# Patient Record
Sex: Female | Born: 1955 | Race: White | Hispanic: No | Marital: Married | Smoking: Never smoker
Health system: Southern US, Community
[De-identification: ages and names within clinical notes are randomized; demographics above are authoritative.]

## PROBLEM LIST (undated history)

## (undated) ENCOUNTER — Emergency Department (HOSPITAL_COMMUNITY): Admission: EM | Discharge status: Absent without leave | Source: Home / Self Care

## (undated) DIAGNOSIS — K759 Inflammatory liver disease, unspecified: Secondary | ICD-10-CM

## (undated) DIAGNOSIS — R112 Nausea with vomiting, unspecified: Secondary | ICD-10-CM

## (undated) DIAGNOSIS — F329 Major depressive disorder, single episode, unspecified: Secondary | ICD-10-CM

## (undated) DIAGNOSIS — Z9889 Other specified postprocedural states: Secondary | ICD-10-CM

## (undated) DIAGNOSIS — G4730 Sleep apnea, unspecified: Secondary | ICD-10-CM

## (undated) DIAGNOSIS — J45909 Unspecified asthma, uncomplicated: Secondary | ICD-10-CM

## (undated) DIAGNOSIS — F32A Depression, unspecified: Secondary | ICD-10-CM

## (undated) DIAGNOSIS — K219 Gastro-esophageal reflux disease without esophagitis: Secondary | ICD-10-CM

## (undated) DIAGNOSIS — Z8489 Family history of other specified conditions: Secondary | ICD-10-CM

## (undated) DIAGNOSIS — F419 Anxiety disorder, unspecified: Secondary | ICD-10-CM

## (undated) DIAGNOSIS — G4733 Obstructive sleep apnea (adult) (pediatric): Secondary | ICD-10-CM

## (undated) DIAGNOSIS — Z953 Presence of xenogenic heart valve: Secondary | ICD-10-CM

## (undated) DIAGNOSIS — I10 Essential (primary) hypertension: Secondary | ICD-10-CM

## (undated) DIAGNOSIS — D649 Anemia, unspecified: Secondary | ICD-10-CM

## (undated) HISTORY — DX: Anxiety disorder, unspecified: F41.9

## (undated) HISTORY — DX: Major depressive disorder, single episode, unspecified: F32.9

## (undated) HISTORY — PX: TONSILLECTOMY: SUR1361

## (undated) HISTORY — PX: CHOLECYSTECTOMY: SHX55

## (undated) HISTORY — PX: CARDIAC CATHETERIZATION: SHX172

## (undated) HISTORY — DX: Depression, unspecified: F32.A

## (undated) HISTORY — PX: ABDOMINAL HYSTERECTOMY: SHX81

## (undated) HISTORY — DX: Obstructive sleep apnea (adult) (pediatric): G47.33

---

## 1999-03-17 ENCOUNTER — Other Ambulatory Visit: Admission: RE | Admit: 1999-03-17 | Discharge: 1999-03-17 | Payer: Self-pay | Admitting: Gynecology

## 2000-07-04 ENCOUNTER — Other Ambulatory Visit: Admission: RE | Admit: 2000-07-04 | Discharge: 2000-07-04 | Payer: Self-pay | Admitting: Gynecology

## 2001-08-01 ENCOUNTER — Other Ambulatory Visit: Admission: RE | Admit: 2001-08-01 | Discharge: 2001-08-01 | Payer: Self-pay | Admitting: Gynecology

## 2002-09-26 ENCOUNTER — Other Ambulatory Visit: Admission: RE | Admit: 2002-09-26 | Discharge: 2002-09-26 | Payer: Self-pay | Admitting: Gynecology

## 2003-11-12 ENCOUNTER — Other Ambulatory Visit: Admission: RE | Admit: 2003-11-12 | Discharge: 2003-11-12 | Payer: Self-pay | Admitting: Gynecology

## 2004-12-15 ENCOUNTER — Other Ambulatory Visit: Admission: RE | Admit: 2004-12-15 | Discharge: 2004-12-15 | Payer: Self-pay | Admitting: Gynecology

## 2006-02-08 ENCOUNTER — Other Ambulatory Visit: Admission: RE | Admit: 2006-02-08 | Discharge: 2006-02-08 | Payer: Self-pay | Admitting: Gynecology

## 2006-08-22 ENCOUNTER — Encounter: Admission: RE | Admit: 2006-08-22 | Discharge: 2006-08-22 | Payer: Self-pay | Admitting: Allergy and Immunology

## 2007-04-13 ENCOUNTER — Other Ambulatory Visit: Admission: RE | Admit: 2007-04-13 | Discharge: 2007-04-13 | Payer: Self-pay | Admitting: Gynecology

## 2008-09-10 ENCOUNTER — Ambulatory Visit (HOSPITAL_COMMUNITY): Admission: RE | Admit: 2008-09-10 | Discharge: 2008-09-11 | Payer: Self-pay | Admitting: Obstetrics and Gynecology

## 2009-02-22 HISTORY — PX: BLADDER SURGERY: SHX569

## 2009-04-28 ENCOUNTER — Encounter: Admission: RE | Admit: 2009-04-28 | Discharge: 2009-04-28 | Payer: Self-pay | Admitting: Orthopedic Surgery

## 2010-02-22 HISTORY — PX: CARPAL TUNNEL RELEASE: SHX101

## 2010-05-31 LAB — BASIC METABOLIC PANEL
BUN: 5 mg/dL — ABNORMAL LOW (ref 6–23)
CO2: 29 mEq/L (ref 19–32)
Chloride: 104 mEq/L (ref 96–112)
Creatinine, Ser: 0.86 mg/dL (ref 0.4–1.2)
Creatinine, Ser: 0.88 mg/dL (ref 0.4–1.2)
GFR calc Af Amer: >60 mL/min (ref >60)
Glucose, Bld: 111 mg/dL — ABNORMAL HIGH (ref 70–99)
Glucose, Bld: 98 mg/dL (ref 70–99)
Potassium: 3.5 mEq/L (ref 3.5–5.1)

## 2010-05-31 LAB — CBC
HCT: 29.6 % — ABNORMAL LOW (ref 36.0–46.0)
Hemoglobin: 10.2 g/dL — ABNORMAL LOW (ref 12.0–15.0)
MCV: 90.6 fL (ref 78.0–100.0)
RBC: 3.27 MIL/uL — ABNORMAL LOW (ref 3.87–5.11)

## 2010-05-31 LAB — URINALYSIS, ROUTINE W REFLEX MICROSCOPIC
Bilirubin Urine: NEGATIVE
Protein, ur: NEGATIVE mg/dL
Specific Gravity, Urine: <1.005 — ABNORMAL LOW (ref 1.005–1.030)
Urobilinogen, UA: 0.2 mg/dL (ref 0.0–1.0)
pH: 6 (ref 5.0–8.0)

## 2010-06-24 ENCOUNTER — Ambulatory Visit (HOSPITAL_COMMUNITY)
Admission: RE | Admit: 2010-06-24 | Discharge: 2010-06-24 | Disposition: A | Discharge status: Home / Self Care | Payer: BC Managed Care – PPO | Source: Ambulatory Visit | Attending: Family Medicine | Admitting: Family Medicine

## 2010-06-24 ENCOUNTER — Other Ambulatory Visit: Payer: Self-pay | Admitting: Family Medicine

## 2010-06-24 ENCOUNTER — Other Ambulatory Visit (HOSPITAL_COMMUNITY): Payer: Self-pay | Admitting: Family Medicine

## 2010-06-24 ENCOUNTER — Ambulatory Visit
Admission: RE | Admit: 2010-06-24 | Discharge: 2010-06-24 | Disposition: A | Discharge status: Home / Self Care | Payer: BC Managed Care – PPO | Source: Ambulatory Visit | Attending: Family Medicine | Admitting: Family Medicine

## 2010-06-24 DIAGNOSIS — R079 Chest pain, unspecified: Secondary | ICD-10-CM | POA: Insufficient documentation

## 2010-06-24 DIAGNOSIS — R0602 Shortness of breath: Secondary | ICD-10-CM

## 2010-06-24 LAB — CREATININE, SERUM
GFR calc Af Amer: >60 mL/min (ref >60)
GFR calc non Af Amer: >60 mL/min (ref >60)

## 2010-06-24 LAB — BUN: BUN: 13 mg/dL (ref 6–23)

## 2010-06-24 MED ORDER — IOHEXOL 300 MG/ML  SOLN
100.0000 mL | Freq: Once | INTRAMUSCULAR | Status: AC | PRN
  Administered 2010-06-24: 100 mL via INTRAVENOUS

## 2010-07-07 NOTE — Op Note (Signed)
NAME:  Miranda Berger, Miranda Berger                   ACCOUNT NO.:  [id number]   MEDICAL RECORD NO.:  [id number]          PATIENT TYPE:  OIB   LOCATION:  9306                          FACILITY:  [facility]   PHYSICIAN:  Randye Lobo, M.D.   DATE OF BIRTH:  January 30, 1956   DATE OF PROCEDURE:  09/10/2008  DATE OF DISCHARGE:                               OPERATIVE REPORT   PREOPERATIVE DIAGNOSIS:  Genuine stress incontinence.   POSTOPERATIVE DIAGNOSIS:  Genuine stress incontinence.   PROCEDURES:  Monarc transobturator sling and cystoscopy.   SURGEON:  Randye Lobo, MD   ASSISTANT:  Luvenia Redden, MD   ANESTHESIA:  General endotracheal, local with 0.5% lidocaine with  epinephrine 1:200,000.   IV FLUIDS:  1400 mL Ringer lactate.   ESTIMATED BLOOD LOSS:  350 mL.   URINE OUTPUT:  Quantity sufficient.   COMPLICATIONS:  None.   INDICATIONS FOR PROCEDURE:  The patient is a 55 year old para 0  Caucasian female, status post supracervical hysterectomy and bilateral  salpingo-oophorectomy for endometriosis several years prior, who  presents with leakage of urine with coughing, laughing, sneezing and  housework.  The patient experiences frequency and also of leakage of  urine without warning.  She has been treated with anticholinergics in  the past.  She continues to have leakage with stressful maneuvers.  She  did undergo urodynamic testing in May 2008 and this did not document  stress incontinence that the patient was noted to have a low bladder  capacity to 254 mL.  The patient is representing now, reporting  continuation of leakage with stressful maneuvers and that this is  interfering with her lifestyle and she wishes to proceed with surgery.  A plan is now made to proceed with a Monarc transobturator sling and  cystoscopy after risks, benefits, and alternatives are reviewed.   FINDINGS:  Cystoscopy demonstrated a normal bladder throughout 360  degrees including the bladder dome and trigone.  There was  no foreign  body in the bladder or the urethra with placement of the sling.  The  ureters were noted to be patent bilaterally.   SPECIMENS:  None.   PROCEDURE IN DETAIL:  The patient was reidentified in the preoperative  hold area.  She received ciprofloxacin 400 mg IV for antibiotic  prophylaxis.  She received TED hose and PAS stockings for DVT  prophylaxis.   In the operating room, general endotracheal anesthesia was induced and  the patient was then placed in the dorsal lithotomy position.  The lower  abdomen, vagina and perineum were sterilely prepped and draped.  A Foley  catheter was left to gravity drainage throughout the procedure.   A weighted speculum was placed inside the vagina and the patient was  examined.  Her cervix was noted to be present.  She was noted to have a  tight pubic arch.  She had redundant labia.  She also had redundant  mucosal folds underneath the mid urethral area.  There was no evidence  of a cystocele.   Allis clamps were used to mark the midline of the anterior  vaginal wall  over a distance of 3 cm starting 1 cm below the urethral meatus.  The  mucosa was injected locally with 0.5% lidocaine with 1:200,000 of  epinephrine.   At this time, the labia minora were sewn back to the labia majora to  improve visualization.  The mucosa was then incised vertically in the  midline with a scalpel.  The patient was noted to have very atrophic  tissue in this area and the dissection was performed with some  difficulty using a combination of sharp and blunt dissection.  The  dissection was carried back to the pubic rami bilaterally.  Hemostasis  was created with monopolar cautery.   The crural fold incisions were then created below the level of the  adductor longus muscles and at the lateral borders of the pubic rami at  the level of the clitoris.  The Monarc needle passers were then placed.  The needle passer was placed first through the left crural fold   incision, through the obturator membrane and muscle and then out through  the endopelvic fascia at the level of the mid urethral and lateral to  this on the ipsilateral side.  The same procedure was performed on the  right-hand side.  The sling was attached to the needle passers and the  sling was drawn out through the thigh incisions.   The Foley catheter was removed and cystoscopy was performed and the  findings are as noted above.  The bladder was then drained of  cystoscopic fluid and the Foley catheter was replaced.  The plastic  sheaths were removed from the surrounding sling as a Kelly clamp was  placed between the urethra and the sling itself.  Placement was noted to  be excellent.   At this time, there was some bleeding from the patient's right-hand side  just below the exit site of the sling.  Monopolar cautery and then  Gelfoam and pressure were used to create good hemostasis.   Excess vaginal mucosa was trimmed and the anterior vaginal wall was then  closed with a running lock suture of 2-0 Vicryl.   A packing with Estrace cream was placed inside the vagina.   The thigh incisions were closed with Dermabond.   This concluded the patient's procedure.  There were no complications.  All needle, instrument, sponge counts were correct.      Randye Lobo, M.D.  Electronically Signed     BES/MEDQ  D:  09/10/2008  T:  09/11/2008  Job:  [id number]   cc:   Leatha Gilding. Mezer, M.D.  Fax: [phone]

## 2012-01-20 ENCOUNTER — Encounter (HOSPITAL_COMMUNITY): Payer: Self-pay

## 2012-01-20 ENCOUNTER — Emergency Department (HOSPITAL_COMMUNITY)
Admission: EM | Admit: 2012-01-20 | Discharge: 2012-01-20 | Disposition: A | Discharge status: Home / Self Care | Payer: BC Managed Care – PPO | Attending: Emergency Medicine | Admitting: Emergency Medicine

## 2012-01-20 DIAGNOSIS — J45909 Unspecified asthma, uncomplicated: Secondary | ICD-10-CM | POA: Insufficient documentation

## 2012-01-20 DIAGNOSIS — M79609 Pain in unspecified limb: Secondary | ICD-10-CM | POA: Insufficient documentation

## 2012-01-20 DIAGNOSIS — F3289 Other specified depressive episodes: Secondary | ICD-10-CM | POA: Insufficient documentation

## 2012-01-20 DIAGNOSIS — F329 Major depressive disorder, single episode, unspecified: Secondary | ICD-10-CM | POA: Insufficient documentation

## 2012-01-20 DIAGNOSIS — M25559 Pain in unspecified hip: Secondary | ICD-10-CM | POA: Insufficient documentation

## 2012-01-20 DIAGNOSIS — F411 Generalized anxiety disorder: Secondary | ICD-10-CM | POA: Insufficient documentation

## 2012-01-20 DIAGNOSIS — Z79899 Other long term (current) drug therapy: Secondary | ICD-10-CM | POA: Insufficient documentation

## 2012-01-20 DIAGNOSIS — B029 Zoster without complications: Secondary | ICD-10-CM

## 2012-01-20 DIAGNOSIS — I10 Essential (primary) hypertension: Secondary | ICD-10-CM | POA: Insufficient documentation

## 2012-01-20 HISTORY — DX: Unspecified asthma, uncomplicated: J45.909

## 2012-01-20 HISTORY — DX: Essential (primary) hypertension: I10

## 2012-01-20 MED ORDER — VALACYCLOVIR HCL 500 MG PO TABS
1000.0000 mg | ORAL_TABLET | Freq: Once | ORAL | Status: AC
  Administered 2012-01-20: 1000 mg via ORAL
  Filled 2012-01-20: qty 2

## 2012-01-20 MED ORDER — METHYLPREDNISOLONE SODIUM SUCC 125 MG IJ SOLR: 125.0000 mg | Freq: Once | INTRAMUSCULAR | Status: DC

## 2012-01-20 MED ORDER — HYDROMORPHONE HCL PF 1 MG/ML IJ SOLN
1.0000 mg | Freq: Once | INTRAMUSCULAR | Status: AC
  Administered 2012-01-20: 1 mg via INTRAVENOUS
  Filled 2012-01-20: qty 1

## 2012-01-20 MED ORDER — OXYCODONE-ACETAMINOPHEN 5-325 MG PO TABS: ORAL_TABLET | ORAL | Status: DC

## 2012-01-20 MED ORDER — VALACYCLOVIR HCL 1 G PO TABS: 1000.0000 mg | ORAL_TABLET | Freq: Three times a day (TID) | ORAL | Status: AC

## 2012-01-20 NOTE — ED Notes (Signed)
Pt reports that Saturday night pain started in hip, back, and right leg.  Pt states that pain has gotten worse.

## 2012-01-20 NOTE — Discharge Instructions (Signed)
 Shingles Shingles is caused by the same virus that causes chickenpox (varicella zoster virus or VZV). Shingles often occurs many years or decades after having chickenpox. That is why it is more common in adults older than 50 years. The virus reactivates and breaks out as an infection in a nerve root. SYMPTOMS   The initial feeling (sensations) may be pain. This pain is usually described as:  Burning.  Stabbing.  Throbbing.  Tingling in the nerve root.  A red rash will follow in a couple days. The rash may occur in any area of the body and is usually on one side (unilateral) of the body in a band or belt-like pattern. The rash usually starts out as very small blisters (vesicles). They will dry up after 7 to 10 days. This is not usually a significant problem except for the pain it causes.  Long-lasting (chronic) pain is more likely in an elderly person. It can last months to years. This condition is called postherpetic neuralgia. Shingles can be an extremely severe infection in someone with AIDS, a weakened immune system, or with forms of leukemia. It can also be severe if you are taking transplant medicines or other medicines that weaken the immune system. TREATMENT  Your caregiver will often treat you with:  Antiviral drugs.  Anti-inflammatory drugs.  Pain medicines. Bed rest is very important in preventing the pain associated with herpes zoster (postherpetic neuralgia). Application of heat in the form of a hot water bottle or electric heating pad or gentle pressure with the hand is recommended to help with the pain or discomfort. PREVENTION  A varicella zoster vaccine is available to help protect against the virus. The Food and Drug Administration approved the varicella zoster vaccine for individuals 55 years of age and older. HOME CARE INSTRUCTIONS   Cool compresses to the area of rash may be helpful.  Only take over-the-counter or prescription medicines for pain, discomfort, or  fever as directed by your caregiver.  Avoid contact with:  Babies.  Pregnant women.  Children with eczema.  Elderly people with transplants.  People with chronic illnesses, such as leukemia and AIDS.  If the area involved is on your face, you may receive a referral for follow-up to a specialist. It is very important to keep all follow-up appointments. This will help avoid eye complications, chronic pain, or disability. SEEK IMMEDIATE MEDICAL CARE IF:   You develop any pain (headache) in the area of the face or eye. This must be followed carefully by your caregiver or ophthalmologist. An infection in part of your eye (cornea) can be very serious. It could lead to blindness.  You do not have pain relief from prescribed medicines.  Your redness or swelling spreads.  The area involved becomes very swollen and painful.  You have a fever.  You notice any red or painful lines extending away from the affected area toward your heart (lymphangitis).  Your condition is worsening or has changed. Document Released: 02/08/2005 Document Revised: 05/03/2011 Document Reviewed: 01/13/2009 [facility] [place] Patient Information 2013 [place], [place].    Narcotic and benzodiazepine use may cause drowsiness, slowed breathing or dependence.  Please use with caution and do not drive, operate machinery or watch young children alone while taking them.  Taking combinations of these medications or drinking alcohol will potentiate these effects.

## 2012-01-20 NOTE — ED Provider Notes (Signed)
History     CSN: [id number]  Arrival date & time 01/20/12  2841   First MD Initiated Contact with Patient 01/20/12 [phone]      Chief Complaint  Patient presents with  . Back Pain  . Hip Pain  . Leg Pain    (Consider location/radiation/quality/duration/timing/severity/associated sxs/prior treatment) HPI Comments: The patient reports that approximately one week ago, and she developed pain in her right lower back without any significant injury. She denies fever or chills, numbness or weakness, urinary symptoms. She reports the pain gradually has gotten worse down her right buttock, right hip and toward her right anterior knee. She did speak to her primary care physician who saw her on Tuesday which was 2 days ago and noticed to "spots on the skin of her low back. She was put on Vicodin as well as high dose ibuprofen for her symptoms. The patient has been taking her pain medications without any significant relief. Pain has continued to get worse and is now constant. That she did have chickenpox as a child. She has never had shingles in the past and has not had her shingles vaccination. Medical history is significant for depression, anxiety and hypertension.  Patient is a 56 y.o. female presenting with back pain, hip pain, and leg pain. The history is provided by the patient and the spouse.  Back Pain  Associated symptoms include leg pain. Pertinent negatives include no fever, no numbness and no weakness.  Hip Pain  Leg Pain  Pertinent negatives include no numbness.    Past Medical History  Diagnosis Date  . Asthma   . Hypertension     Past Surgical History  Procedure Date  . Abdominal hysterectomy   . Cholecystectomy   . Tonsillectomy     History reviewed. No pertinent family history.  History  Substance Use Topics  . Smoking status: Never Smoker   . Smokeless tobacco: Not on file  . Alcohol Use: No    OB History    Grav Para Term Preterm Abortions TAB SAB Ect Mult  Living                  Review of Systems  Constitutional: Negative for fever and chills.  Genitourinary: Negative for difficulty urinating.  Musculoskeletal: Positive for back pain.  Skin: Positive for color change and rash. Negative for wound.  Neurological: Negative for weakness and numbness.  All other systems reviewed and are negative.    Allergies  Prednisone  Home Medications   Current Outpatient Rx  Name  Route  Sig  Dispense  Refill  . ALPRAZOLAM 0.25 MG PO TABS   Oral   Take 0.25 mg by mouth at bedtime as needed.         . ATENOLOL 50 MG PO TABS   Oral   Take 50 mg by mouth daily.         [name] FLUTICASONE PROPIONATE 50 MCG/ACT NA SUSP   Nasal   Place 2 sprays into the nose daily.         . IBUPROFEN 200 MG PO TABS   Oral   Take 600 mg by mouth 3 (three) times daily.         . SERTRALINE HCL 100 MG PO TABS   Oral   Take 200 mg by mouth daily.         . OXYCODONE-ACETAMINOPHEN 5-325 MG PO TABS      1-2 tablets po q 6 hours prn moderate to severe pain  20 tablet   0   . VALACYCLOVIR HCL 1 G PO TABS   Oral   Take 1 tablet (1,000 mg total) by mouth 3 (three) times daily.   21 tablet   0     BP 136/98  Pulse 80  Temp 98.1 F (36.7 C) (Oral)  Resp 20  SpO2 97%  Physical Exam  Nursing note and vitals reviewed. Constitutional: She is oriented to person, place, and time. She appears well-developed and well-nourished.  HENT:  Head: Normocephalic and atraumatic.  Pulmonary/Chest: Effort normal. No respiratory distress. She has no wheezes.  Abdominal: Soft.  Musculoskeletal:       Lumbar back: She exhibits no deformity.       Back:  Neurological: She is alert and oriented to person, place, and time. She has normal strength and normal reflexes. GCS eye subscore is 4. GCS verbal subscore is 5. GCS motor subscore is 6.  Skin: Skin is warm. Rash noted. Rash is vesicular. She is not diaphoretic. No pallor.  Psychiatric: Her speech is normal  and behavior is normal. Judgment normal. Her mood appears anxious. Cognition and memory are normal.    ED Course  Procedures (including critical care time)  Labs Reviewed - No data to display No results found.   1. Shingles     Room air saturation is 97% and I interpret this to be norma   MDM  History and examination is consistent with shingles. Patient's pain radiates right along her L5 dermatome. My suspicions are relayed to the patient and family. My plan is to give IV analgesics for more severe pain and increase her prescription for Percocet. I will add Valtrex to her medications although her symptoms have been present for nearly a week already, I am unsure if this will improve her symptoms. She does have adverse reactions to prednisone therefore we'll not give her any Solu-Medrol as originally planned.        Gavin Pound. Oletta Lamas, MD 01/20/12 [phone]

## 2012-06-22 DIAGNOSIS — R300 Dysuria: Secondary | ICD-10-CM | POA: Insufficient documentation

## 2012-06-22 DIAGNOSIS — R102 Pelvic and perineal pain unspecified side: Secondary | ICD-10-CM | POA: Insufficient documentation

## 2012-06-22 DIAGNOSIS — R3915 Urgency of urination: Secondary | ICD-10-CM | POA: Insufficient documentation

## 2012-06-22 DIAGNOSIS — R109 Unspecified abdominal pain: Secondary | ICD-10-CM | POA: Insufficient documentation

## 2012-06-22 DIAGNOSIS — N3010 Interstitial cystitis (chronic) without hematuria: Secondary | ICD-10-CM | POA: Insufficient documentation

## 2012-06-22 DIAGNOSIS — IMO0002 Reserved for concepts with insufficient information to code with codable children: Secondary | ICD-10-CM

## 2012-06-22 HISTORY — DX: Pelvic and perineal pain: R10.2

## 2012-06-22 HISTORY — DX: Unspecified abdominal pain: R10.9

## 2012-06-22 HISTORY — DX: Reserved for concepts with insufficient information to code with codable children: IMO0002

## 2012-06-22 HISTORY — DX: Urgency of urination: R39.15

## 2012-06-22 HISTORY — DX: Dysuria: R30.0

## 2012-06-22 HISTORY — DX: Interstitial cystitis (chronic) without hematuria: N30.10

## 2013-01-03 ENCOUNTER — Other Ambulatory Visit: Payer: Self-pay | Admitting: Family Medicine

## 2013-01-03 DIAGNOSIS — R109 Unspecified abdominal pain: Secondary | ICD-10-CM

## 2013-01-08 ENCOUNTER — Other Ambulatory Visit: Payer: Self-pay | Admitting: Family Medicine

## 2013-01-08 ENCOUNTER — Ambulatory Visit
Admission: RE | Admit: 2013-01-08 | Discharge: 2013-01-08 | Disposition: A | Discharge status: Home / Self Care | Payer: BC Managed Care – PPO | Source: Ambulatory Visit | Attending: Family Medicine | Admitting: Family Medicine

## 2013-01-08 DIAGNOSIS — R110 Nausea: Secondary | ICD-10-CM

## 2013-01-08 DIAGNOSIS — R1013 Epigastric pain: Secondary | ICD-10-CM

## 2013-01-08 DIAGNOSIS — R109 Unspecified abdominal pain: Secondary | ICD-10-CM

## 2013-04-24 ENCOUNTER — Ambulatory Visit
Admission: RE | Admit: 2013-04-24 | Discharge: 2013-04-24 | Disposition: A | Discharge status: Home / Self Care | Payer: BC Managed Care – PPO | Source: Ambulatory Visit | Attending: Family Medicine | Admitting: Family Medicine

## 2013-04-24 ENCOUNTER — Other Ambulatory Visit: Payer: Self-pay | Admitting: Family Medicine

## 2013-04-24 DIAGNOSIS — M545 Low back pain, unspecified: Secondary | ICD-10-CM

## 2013-04-25 ENCOUNTER — Other Ambulatory Visit: Payer: Self-pay | Admitting: Family Medicine

## 2013-04-25 DIAGNOSIS — M5410 Radiculopathy, site unspecified: Secondary | ICD-10-CM

## 2013-04-25 DIAGNOSIS — M549 Dorsalgia, unspecified: Secondary | ICD-10-CM

## 2013-04-27 ENCOUNTER — Ambulatory Visit
Admission: RE | Admit: 2013-04-27 | Discharge: 2013-04-27 | Disposition: A | Discharge status: Home / Self Care | Payer: BC Managed Care – PPO | Source: Ambulatory Visit | Attending: Family Medicine | Admitting: Family Medicine

## 2013-04-27 DIAGNOSIS — M5410 Radiculopathy, site unspecified: Secondary | ICD-10-CM

## 2013-04-27 DIAGNOSIS — M549 Dorsalgia, unspecified: Secondary | ICD-10-CM

## 2013-12-26 DIAGNOSIS — N9489 Other specified conditions associated with female genital organs and menstrual cycle: Secondary | ICD-10-CM | POA: Insufficient documentation

## 2013-12-26 DIAGNOSIS — M6289 Other specified disorders of muscle: Secondary | ICD-10-CM | POA: Insufficient documentation

## 2013-12-26 HISTORY — DX: Other specified conditions associated with female genital organs and menstrual cycle: N94.89

## 2013-12-26 HISTORY — DX: Other specified disorders of muscle: M62.89

## 2014-07-31 ENCOUNTER — Ambulatory Visit (INDEPENDENT_AMBULATORY_CARE_PROVIDER_SITE_OTHER): Payer: BC Managed Care – PPO | Admitting: Neurology

## 2014-07-31 ENCOUNTER — Encounter: Payer: Self-pay | Admitting: Neurology

## 2014-07-31 VITALS — BP 102/64 | HR 68 | Resp 16 | Ht 63.0 in | Wt 194.0 lb

## 2014-07-31 DIAGNOSIS — R519 Headache, unspecified: Secondary | ICD-10-CM

## 2014-07-31 DIAGNOSIS — R351 Nocturia: Secondary | ICD-10-CM

## 2014-07-31 DIAGNOSIS — R51 Headache: Secondary | ICD-10-CM

## 2014-07-31 DIAGNOSIS — G2581 Restless legs syndrome: Secondary | ICD-10-CM | POA: Diagnosis not present

## 2014-07-31 DIAGNOSIS — G4719 Other hypersomnia: Secondary | ICD-10-CM | POA: Diagnosis not present

## 2014-07-31 DIAGNOSIS — G4761 Periodic limb movement disorder: Secondary | ICD-10-CM

## 2014-07-31 DIAGNOSIS — R0683 Snoring: Secondary | ICD-10-CM | POA: Diagnosis not present

## 2014-07-31 NOTE — Progress Notes (Signed)
Subjective:    Patient ID: Miranda GamblesRita S Schlagel is a 59 y.o. female.  HPI     Huston FoleySaima Laurann Mcmorris, MD, PhD [facility] [address], [address] [address] [place], [place] [address]  Dear [name],   I saw your patient, Miranda MonsRita Berger, upon your kind request in my neurologic clinic today for initial consultation of her sleep disorder, in particular, concern for underlying obstructive sleep apnea. The patient is unaccompanied today. As you know, Ms. Miranda KentHood is a 59 year old right-handed woman with an underlying medical history of major depression, panic disorder, hypertension, carpal tunnel syndrome, status post surgery on the right wrist, left sided plantar fasciitis, and obesity, who reports snoring, witnessed apneas, poor sleep consolidation and excessive daytime somnolence. I reviewed your office note from 06/25/2014 which you kindly included. She had a sleep study about a year ago but since then her sleep issues have become worse. She can take prolonged during the day. She is currently on Xanax 0.25 mg up to 4 times a day as needed for anxiety and Remeron 45 mg each night. She has been off of Zoloft and on Remeron for the past 1+ year. She had a sleep study over a year ago which per her report showed leg twitching at night. She does have some restless leg symptoms. She wakes up to use the bathroom perhaps once per night. She has occasional morning headaches. She does not wake up rested despite getting enough sleep. Bedtime is around 10:30 to 11 and usual price time is around 8:30. Epworth sleepiness score is 17 out of 24 today and fatigue score is 15 out of 63 today. Her father has obstructive sleep apnea and uses a CPAP machine. She had a tonsillectomy in the past. She would be willing to use a CPAP machine if the need arises. She drinks usually coffee a few times a week but does not have to have caffeine every day. She is a nonsmoker and drinks alcohol very rarely. 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 Snoring can be quite loud. She makes abnormal noises in her sleep. She has some leg twitching at night  Her Past Medical History Is Significant For: Past Medical History  Diagnosis Date  . Asthma   . Hypertension   . Depression   . Anxiety     Her Past Surgical History Is Significant For: Past Surgical History  Procedure Laterality Date  . Abdominal hysterectomy    . Cholecystectomy    . Tonsillectomy    . Carpal tunnel release  2012  . Bladder surgery  2011    Her Family History Is Significant For: Family History  Problem Relation Age of Onset  . Stroke Mother   . Leukemia Mother   . Stroke Father   . Sleep apnea Father     Her Social History Is Significant For: History   Social History  . Marital Status: Married    Spouse Name: N/A  . Number of Children: 1  . Years of Education: BS   Social History Main Topics  . Smoking status: Never Smoker   . Smokeless tobacco: Not on file  . Alcohol Use: No  . Drug Use: No  . Sexual Activity: Not on file   Other Topics Concern  . None   Social History Narrative   Occasionally consumes tea or coffee    Her Allergies Are:  Allergies  Allergen Reactions  . Prednisone Itching  :   Her  Current Medications Are:  Outpatient Encounter Prescriptions as of 07/31/2014  Medication Sig  . ALPRAZolam (XANAX) 0.25 MG tablet Take 0.25 mg by mouth at bedtime as needed.  [name] atenolol (TENORMIN) 50 MG tablet Take 50 mg by mouth daily.  . calcium carbonate (OS-CAL) 600 MG TABS tablet Take 600 mg by mouth 2 (two) times daily with a meal.  . fluticasone (FLONASE) 50 MCG/ACT nasal spray Place 2 sprays into the nose daily.  [name] HYDROcodone-acetaminophen (NORCO/VICODIN) 5-325 MG per tablet TK 1 T PO  Q 6 TO 8 H PRN P  . mirtazapine (REMERON) 30 MG tablet TK 1 T PO  QHS.  . Multiple Vitamin (MULTIVITAMIN) capsule Take 1 capsule by mouth daily.  . [DISCONTINUED] ibuprofen (ADVIL,MOTRIN) 200 MG  tablet Take 600 mg by mouth 3 (three) times daily.  . [DISCONTINUED] oxyCODONE-acetaminophen (PERCOCET/ROXICET) 5-325 MG per tablet 1-2 tablets po q 6 hours prn moderate to severe pain  . [DISCONTINUED] sertraline (ZOLOFT) 100 MG tablet Take 200 mg by mouth daily.   No facility-administered encounter medications on file as of 07/31/2014.  :  Review of Systems:  Out of a complete 14 point review of systems, all are reviewed and negative with the exception of these symptoms as listed below:   Review of Systems  Constitutional: Positive for fatigue.       Weight gain   Genitourinary:       Incontinence   Allergic/Immunologic: Positive for environmental allergies.  Neurological:       Snoring, no reported trouble falling or staying asleep, husband reports patient has "labored breathing" at night, wakes up in the morning feeling tired, daytime tiredness, patient reports she will fall asleep if she sits down, takes daily naps.   Psychiatric/Behavioral:       Depression, anxiety, racing thoughts     Objective:  Neurologic Exam  Physical Exam Physical Examination:   Filed Vitals:   07/31/14 1408  BP: 102/64  Pulse: 68  Resp: 16    General Examination: The patient is a very pleasant 59 y.o. female in no acute distress. She appears well-developed and well-nourished and well groomed. She is mildly anxious appearing.   HEENT: Normocephalic, atraumatic, pupils are equal, round and reactive to light and accommodation. Funduscopic exam is normal with sharp disc margins noted. Extraocular tracking is good without limitation to gaze excursion or nystagmus noted. Normal smooth pursuit is noted. Hearing is grossly intact. Tympanic membranes are clear bilaterally. Face is symmetric with normal facial animation and normal facial sensation. Speech is clear with no dysarthria noted. There is no hypophonia. There is no lip, neck/head, jaw or voice tremor. Neck is supple with full range of passive and  active motion. There are no carotid bruits on auscultation. Oropharynx exam reveals: mild mouth dryness, good dental hygiene and mild airway crowding, due to narrow airway entry and redundant soft palate. Mallampati is class II. Tongue protrudes centrally and palate elevates symmetrically. Tonsils are absent. Neck size is 15.5 inches. She has a Absent overbite. Nasal inspection reveals no significant nasal mucosal bogginess or redness and no septal deviation.   Chest: Clear to auscultation without wheezing, rhonchi or crackles noted.  Heart: S1+S2+0, regular and normal without murmurs, rubs or gallops noted.   Abdomen: Soft, non-tender and non-distended with normal bowel sounds appreciated on auscultation.  Extremities: There is no pitting edema in the distal lower extremities bilaterally. Pedal pulses are intact. She has mild nonpitting puffiness around the lateral aspect of the left ankle. She  has left heel pain.  Skin: Warm and dry without trophic changes noted. There are no varicose veins.  Musculoskeletal: exam reveals no obvious joint deformities, tenderness or joint swelling or erythema.   Neurologically:  Mental status: The patient is awake, alert and oriented in all 4 spheres. Her immediate and remote memory, attention, language skills and fund of knowledge are appropriate. There is no evidence of aphasia, agnosia, apraxia or anomia. Speech is clear with normal prosody and enunciation. Thought process is linear. Mood is normal and affect is normal.  Cranial nerves II - XII are as described above under HEENT exam. In addition: shoulder shrug is normal with equal shoulder height noted. Motor exam: Normal bulk, strength and tone is noted. There is no drift, tremor or rebound. Romberg is negative. Reflexes are 2+ throughout. Babinski: Toes are flexor bilaterally. Fine motor skills and coordination: intact with normal finger taps, normal hand movements, normal rapid alternating patting, normal  foot taps and normal foot agility.  Cerebellar testing: No dysmetria or intention tremor on finger to nose testing. Heel to shin is unremarkable bilaterally. There is no truncal or gait ataxia.  Sensory exam: intact to light touch, pinprick, vibration, temperature sense in the upper and lower extremities.  Gait, station and balance: She stands easily. No veering to one side is noted. No leaning to one side is noted. Posture is age-appropriate and stance is narrow based. Gait shows normal stride length and normal pace. No problems turning are noted. She turns en bloc. Tandem walk is slightly difficult for her, due to left heel pain.    Assessment and plan:   In summary, FRITZIE PRIOLEAU is a very pleasant 59 y.o.-year old female with an underlying medical history of major depression, panic disorder, hypertension, carpal tunnel syndrome, status post surgery on the right wrist, left sided plantar fasciitis, and obesity, who reports snoring, witnessed apneas, poor sleep consolidation and excessive daytime somnolence. Her history and physical exam are indeed concerning for underlying obstructive sleep apnea. In addition, she endorses restless leg syndrome and leg twitching at night. I had a long chat with the patient about my findings and the diagnosis of OSA, its prognosis and treatment options. We talked about medical treatments, surgical interventions and non-pharmacological approaches. I explained in particular the risks and ramifications of untreated moderate to severe OSA, especially with respect to developing cardiovascular disease down the Road, including congestive heart failure, difficult to treat hypertension, cardiac arrhythmias, or stroke. Even type 2 diabetes has, in part, been linked to untreated OSA. Symptoms of untreated OSA include daytime sleepiness, memory problems, mood irritability and mood disorder such as depression and anxiety, lack of energy, as well as recurrent headaches, especially morning  headaches. We talked about trying to maintain a healthy lifestyle in general, as well as the importance of weight control. I encouraged the patient to eat healthy, exercise daily and keep well hydrated, to keep a scheduled bedtime and wake time routine, to not skip any meals and eat healthy snacks in between meals. I advised the patient not to drive when feeling sleepy. I recommended the following at this time: sleep study with potential positive airway pressure titration. (We will score hypopneas at 3% and split the sleep study into diagnostic and treatment portion, if the estimated. 2 hour AHI is >15/h).   I explained the sleep test procedure to the patient and also outlined possible surgical and non-surgical treatment options of OSA, including the use of a custom-made dental device (which would  require a referral to a specialist dentist or oral surgeon), upper airway surgical options, such as pillar implants, radiofrequency surgery, tongue base surgery, and UPPP (which would involve a referral to an ENT surgeon). Rarely, jaw surgery such as mandibular advancement may be considered.  I also explained the CPAP treatment option to the patient, who indicated that she would be willing to try CPAP if the need arises. I explained the importance of being compliant with PAP treatment, not only for insurance purposes but primarily to improve Her symptoms, and for the patient's long term health benefit, including to reduce Her cardiovascular risks. I answered all her questions today and the patient was in agreement. I would like to see her back after the sleep study is completed and encouraged her to call with any interim questions, concerns, problems or updates.   Thank you very much for allowing me to participate in the care of this nice patient. If I can be of any further assistance to you please do not hesitate to call me at [phone].  Sincerely,   Star Age, MD, PhD

## 2014-07-31 NOTE — Patient Instructions (Addendum)

## 2014-08-20 ENCOUNTER — Ambulatory Visit (INDEPENDENT_AMBULATORY_CARE_PROVIDER_SITE_OTHER): Payer: BC Managed Care – PPO | Admitting: Neurology

## 2014-08-20 DIAGNOSIS — G4761 Periodic limb movement disorder: Secondary | ICD-10-CM

## 2014-08-20 DIAGNOSIS — G4730 Sleep apnea, unspecified: Secondary | ICD-10-CM

## 2014-08-20 DIAGNOSIS — G4710 Hypersomnia, unspecified: Secondary | ICD-10-CM

## 2014-08-20 DIAGNOSIS — G4733 Obstructive sleep apnea (adult) (pediatric): Secondary | ICD-10-CM | POA: Diagnosis not present

## 2014-08-20 DIAGNOSIS — G479 Sleep disorder, unspecified: Secondary | ICD-10-CM

## 2014-08-21 NOTE — Sleep Study (Signed)
Please see the scanned sleep study interpretation located in the Procedure tab within the Chart Review section. 

## 2014-08-23 ENCOUNTER — Telehealth: Payer: Self-pay | Admitting: Neurology

## 2014-08-23 DIAGNOSIS — G4733 Obstructive sleep apnea (adult) (pediatric): Secondary | ICD-10-CM

## 2014-08-23 NOTE — Telephone Encounter (Signed)
Please call and notify the patient that the recent sleep study did confirm the diagnosis of obstructive sleep apnea and that I recommend treatment for this in the form of CPAP. This will require a repeat sleep study for proper titration and mask fitting. Please explain to patient and arrange for a CPAP titration study. I have placed an order in the chart. Thanks, Terasa Orsini, MD, PhD [facility] ([facility])  

## 2014-08-27 NOTE — Telephone Encounter (Signed)
I spoke with Nicolaus [name], she is aware of results. She is willing to proceed and is aware that Alvis LemmingsDawn will call back to schedule second study. I will fax report to referring doctor and PCP.

## 2014-09-17 ENCOUNTER — Ambulatory Visit (INDEPENDENT_AMBULATORY_CARE_PROVIDER_SITE_OTHER): Payer: BC Managed Care – PPO | Admitting: Neurology

## 2014-09-17 DIAGNOSIS — G479 Sleep disorder, unspecified: Secondary | ICD-10-CM

## 2014-09-17 DIAGNOSIS — G4733 Obstructive sleep apnea (adult) (pediatric): Secondary | ICD-10-CM

## 2014-09-17 DIAGNOSIS — G4761 Periodic limb movement disorder: Secondary | ICD-10-CM

## 2014-09-17 NOTE — Sleep Study (Signed)
Please see the scanned sleep study interpretation located in the Procedure tab within the Chart Review section. 

## 2014-09-26 ENCOUNTER — Telehealth: Payer: Self-pay | Admitting: Neurology

## 2014-09-26 DIAGNOSIS — G4733 Obstructive sleep apnea (adult) (pediatric): Secondary | ICD-10-CM

## 2014-09-26 NOTE — Telephone Encounter (Signed)
Patient seen on 07/31/14, PSG on 08/20/14, CPAP titration study on 09/17/14, ins: [employer] Please call and inform patient that I have entered an order for treatment with PAP. She did well during the latest sleep study with CPAP. We will, therefore, arrange for a machine for home use through a DME (durable medical equipment) company of Her choice; and I will see the patient back in follow-up in about 8-10 weeks. Please also explain to the patient that I will be looking out for compliance data downloaded from the machine, which can be done remotely through a modem at times or stored on an SD card in the back of the machine. At the time of the followup appointment we will discuss sleep study results and how it is going with PAP treatment at home. Please advise patient to bring Her machine at the time of the visit; at least for the first visit, even though this is cumbersome. Bringing the machine for every visit after that may not be needed, but often helps for the first visit. Please also make sure, the patient has a follow-up appointment with me in about 8-10 weeks from the setup date, thanks.   Huston Foley, MD, PhD [facility])

## 2014-09-27 ENCOUNTER — Telehealth: Payer: Self-pay

## 2014-09-27 NOTE — Telephone Encounter (Signed)
I spoke to patient and she is aware of results and recommendation. She states that she would like to get started on CPAP at home. I will send referral to [facility] and send the patient letting her know who we referred her too and the importance of compliance. I will fax report to PCP.

## 2014-09-27 NOTE — Telephone Encounter (Signed)
Huston Foley, MD at 09/26/2014 12:27 PM     Status: Signed       Expand All Collapse All   Patient seen on 07/31/14, PSG on 08/20/14, CPAP titration study on 09/17/14, ins: [employer] Please call and inform patient that I have entered an order for treatment with PAP. She did well during the latest sleep study with CPAP. We will, therefore, arrange for a machine for home use through a DME (durable medical equipment) company of Her choice; and I will see the patient back in follow-up in about 8-10 weeks. Please also explain to the patient that I will be looking out for compliance data downloaded from the machine, which can be done remotely through a modem at times or stored on an SD card in the back of the machine. At the time of the followup appointment we will discuss sleep study results and how it is going with PAP treatment at home. Please advise patient to bring Her machine at the time of the visit; at least for the first visit, even though this is cumbersome. Bringing the machine for every visit after that may not be needed, but often helps for the first visit. Please also make sure, the patient has a follow-up appointment with me in about 8-10 weeks from the setup date, thanks.   Huston Foley, MD, PhD [facility])

## 2014-11-18 ENCOUNTER — Telehealth: Payer: Self-pay | Admitting: Neurology

## 2014-11-18 NOTE — Telephone Encounter (Signed)
Pt is scheduled for her cpap f/u on 11/21/14 and needs to reschedule this but know this has to be ina certain time frame and needs to speak with the nurse. Please call pt @ [phone].

## 2014-11-18 NOTE — Telephone Encounter (Signed)
I spoke to patient and was able to move appt to 10/12.

## 2014-11-21 ENCOUNTER — Ambulatory Visit: Payer: Self-pay | Admitting: Neurology

## 2014-12-04 ENCOUNTER — Encounter: Payer: Self-pay | Admitting: Neurology

## 2014-12-04 ENCOUNTER — Ambulatory Visit (INDEPENDENT_AMBULATORY_CARE_PROVIDER_SITE_OTHER): Payer: BC Managed Care – PPO | Admitting: Neurology

## 2014-12-04 VITALS — BP 128/76 | HR 72 | Resp 16 | Ht 63.0 in | Wt 194.0 lb

## 2014-12-04 DIAGNOSIS — G4761 Periodic limb movement disorder: Secondary | ICD-10-CM | POA: Diagnosis not present

## 2014-12-04 DIAGNOSIS — G4733 Obstructive sleep apnea (adult) (pediatric): Secondary | ICD-10-CM

## 2014-12-04 DIAGNOSIS — Z9989 Dependence on other enabling machines and devices: Principal | ICD-10-CM

## 2014-12-04 DIAGNOSIS — G2581 Restless legs syndrome: Secondary | ICD-10-CM

## 2014-12-04 NOTE — Patient Instructions (Addendum)

## 2014-12-04 NOTE — Progress Notes (Signed)
Subjective:    Patient ID: DEMESHIA Berger is a 59 y.o. female.  HPI     Interim history:   Ms. Miranda Berger is a 59 year old right-handed woman with an underlying medical history of major depression, panic disorder, hypertension, carpal tunnel syndrome, status post surgery on the right wrist, left sided plantar fasciitis, and obesity, who presents for follow-up consultation of her obstructive sleep apnea, after her recent sleep studies. The patient is unaccompanied today. I first met her on 07/31/2014 at the request of her psychiatrist, at which time the patient reported snoring, poor sleep consolidation, excessive daytime somnolence and witnessed apneic pauses while asleep. I invited her back for sleep study. She had a baseline sleep study, followed by a CPAP titration study and I went over her test results with her in detail today. Her baseline sleep study from 08/20/2014 showed a sleep efficiency of 89.6% with a latency to sleep of 34 minutes and wake after sleep onset of 10.5 minutes with mild sleep fragmentation noted. She had an increased percentage of slow-wave sleep and a normal percentage of REM sleep with a normal REM latency. She had moderate PLMS with mild arousals with a PLM index of 35.2 per hour and a PLM associated arousal index of 4.1 per hour. She had no significant EKG or EEG changes. She had mild to moderate and at times loud snoring. Total AHI was 10.7 per hour, rising to 26.7 per hour during REM sleep and 32.2 per hour in the supine position. Baseline oxygen saturation was 92%, nadir was 71% during REM sleep. Based on her test results I invited her back for a full night CPAP titration study. She had this on 09/17/2014. Sleep efficiency was 88%, latency to sleep of 16.5 minutes and wake after sleep onset was 38 minutes with mild to moderate sleep fragmentation noted. She had a normal arousal index. She had an increased percentage of slow-wave sleep and an increased percentage of REM sleep with a  normal REM latency. She had severe PLMS with an index of 74.1 per hour, with an associated arousal index of only 3.7 per hour. Average oxygen saturation was 95%, nadir was 90%. CPAP was titrated from a pressure of 5 cm to 15 cm. Her AHI was 2.2 per hour on a pressure of 13 cm. Based on her test results are prescribed CPAP for home use.  Today, 12/04/2014: I reviewed her CPAP compliance data from 11/03/2014 through 12/02/2014 which is a total of 30 days during which time she used her machine every night with percent used days greater than 4 hours at 80%, indicating very good compliance with an average usage of 5 hours and 12 minutes, residual AHI low at 0.8 per hour, leak at times high, pressure at 13 cm.  Today, 12/04/2014: She reports doing well, after an initial adjustment period. She feels, she is sleeping better. Weight is stable, she saw Dr. Caprice Beaver. She is going to [place] to [place] a few weeks from now. She is an Automotive engineer, has been on medical leave. Overall, she feels rather pleased with how she has done with CPAP therapy. She no longer snores. She feels better rested. She has more daytime energy. She has a history of restless leg symptoms and leg twitching before falling asleep but this is currently not a pressing problem for her. We can certainly monitor for flareup of RLS symptoms. She had significant PLMS in both sleep studies.  Previously:  07/31/2014: She reports snoring, witnessed apneas, poor sleep  consolidation and excessive daytime somnolence. I reviewed your office note from 06/25/2014 which you kindly included. She had a sleep study about a year ago but since then her sleep issues have become worse. She can take prolonged during the day. She is currently on Xanax 0.25 mg up to 4 times a day as needed for anxiety and Remeron 45 mg each night. She has been off of Zoloft and on Remeron for the past 1+ year. She had a sleep study over a year ago which per her report showed  leg twitching at night. She does have some restless leg symptoms. She wakes up to use the bathroom perhaps once per night. She has occasional morning headaches. She does not wake up rested despite getting enough sleep. Bedtime is around 10:30 to 11 and usual price time is around 8:30. Epworth sleepiness score is 17 out of 24 today and fatigue score is 15 out of 63 today. Her father has obstructive sleep apnea and uses a CPAP machine. She had a tonsillectomy in the past. She would be willing to use a CPAP machine if the need arises. She drinks usually coffee a few times a week but does not have to have caffeine every day. She is a nonsmoker and drinks alcohol very rarely. 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 Snoring can be quite loud. She makes abnormal noises in her sleep. She has some leg twitching at night  Her Past Medical History Is Significant For: Past Medical History  Diagnosis Date  . Asthma   . Hypertension   . Depression   . Anxiety     Her Past Surgical History Is Significant For: Past Surgical History  Procedure Laterality Date  . Abdominal hysterectomy    . Cholecystectomy    . Tonsillectomy    . Carpal tunnel release  2012  . Bladder surgery  2011    Her Family History Is Significant For: Family History  Problem Relation Age of Onset  . Stroke Mother   . Leukemia Mother   . Stroke Father   . Sleep apnea Father     Her Social History Is Significant For: Social History   Social History  . Marital Status: Married    Spouse Name: N/A  . Number of Children: 1  . Years of Education: BS   Social History Main Topics  . Smoking status: Never Smoker   . Smokeless tobacco: None  . Alcohol Use: No  . Drug Use: No  . Sexual Activity: Not Asked   Other Topics Concern  . None   Social History Narrative   Occasionally consumes tea or coffee    Her Allergies Are:  Allergies  Allergen Reactions  . Prednisone Itching  :    Her Current Medications Are:  Outpatient Encounter Prescriptions as of 12/04/2014  Medication Sig  . ALPRAZolam (XANAX) 0.25 MG tablet Take 0.25 mg by mouth at bedtime as needed.  [name] atenolol (TENORMIN) 50 MG tablet Take 50 mg by mouth daily.  . calcium carbonate (OS-CAL) 600 MG TABS tablet Take 600 mg by mouth 2 (two) times daily with a meal.  . fluticasone (FLONASE) 50 MCG/ACT nasal spray Place 2 sprays into the nose daily.  [name] HYDROcodone-acetaminophen (NORCO/VICODIN) 5-325 MG per tablet TK 1 T PO  Q 6 TO 8 H PRN P  . mirtazapine (REMERON) 45 MG tablet TK 1 T PO QD HS  . Multiple Vitamin (MULTIVITAMIN) capsule Take 1 capsule by mouth daily.  . [  DISCONTINUED] mirtazapine (REMERON) 30 MG tablet TK 1 T PO  QHS.   No facility-administered encounter medications on file as of 12/04/2014.  :  Review of Systems:  Out of a complete 14 point review of systems, all are reviewed and negative with the exception of these symptoms as listed below:   Review of Systems  Neurological:       Patient is here for initial CPAP f/u. Reports that she is doing well on it and feels better using CPAP. No new concerns.     Objective:  Neurologic Exam  Physical Exam Physical Examination:   Filed Vitals:   12/04/14 1519  BP: 128/76  Pulse: 72  Resp: 16     General Examination: The patient is a very pleasant 59 y.o. female in no acute distress. She appears well-developed and well-nourished and well groomed. She is less anxious appearing.   HEENT: Normocephalic, atraumatic, pupils are equal, round and reactive to light and accommodation. Extraocular tracking is good without limitation to gaze excursion or nystagmus noted. Normal smooth pursuit is noted. Hearing is grossly intact. Tympanic membranes are clear bilaterally. Face is symmetric with normal facial animation and normal facial sensation. Speech is clear with no dysarthria noted. There is no hypophonia. There is no lip, neck/head, jaw or voice  tremor. Neck is supple with full range of passive and active motion. There are no carotid bruits on auscultation. Oropharynx exam reveals: mild mouth dryness, good dental hygiene and mild airway crowding, due to narrow airway entry and redundant soft palate. Mallampati is class II. Tongue protrudes centrally and palate elevates symmetrically. Tonsils are absent. She has a Absent overbite. Nasal inspection reveals no significant nasal mucosal bogginess or redness and no septal deviation.   Chest: Clear to auscultation without wheezing, rhonchi or crackles noted.  Heart: S1+S2+0, regular and normal without murmurs, rubs or gallops noted.   Abdomen: Soft, non-tender and non-distended with normal bowel sounds appreciated on auscultation.  Extremities: There is no pitting edema in the distal lower extremities bilaterally. Pedal pulses are intact. She has mild nonpitting puffiness around the ankles.  Skin: Warm and dry without trophic changes noted. There are no varicose veins.  Musculoskeletal: exam reveals no obvious joint deformities, tenderness or joint swelling or erythema.   Neurologically:  Mental status: The patient is awake, alert and oriented in all 4 spheres. Her immediate and remote memory, attention, language skills and fund of knowledge are appropriate. There is no evidence of aphasia, agnosia, apraxia or anomia. Speech is clear with normal prosody and enunciation. Thought process is linear. Mood is normal and affect is normal.  Cranial nerves II - XII are as described above under HEENT exam. In addition: shoulder shrug is normal with equal shoulder height noted. Motor exam: Normal bulk, strength and tone is noted. There is no drift, tremor or rebound. Romberg is negative. Reflexes are 2+ throughout. Fine motor skills and coordination: intact.  Sensory exam: intact to light touch in the upper and lower extremities.  Gait, station and balance: She stands easily. No veering to one side is  noted. No leaning to one side is noted. Posture is age-appropriate and stance is narrow based. Gait shows normal stride length and normal pace. No problems turning are noted. She turns en bloc. Tandem walk is slightly difficult for her, unchanged.     Assessment and plan:   In summary, EURIKA SANDY is a very pleasant 59 year old female with an underlying medical history of major depression, panic  disorder, hypertension, carpal tunnel syndrome, status post surgery on the right wrist, left sided plantar fasciitis, and obesity, who presents for follow-up consultation of her obstructive sleep apnea. She had sleep study testing in June 2016 and a CPAP titration study in July 2016. Today, we talked about her sleep study results in detail. She is now established on CPAP therapy at a pressure of 13 cm with very good compliance and good results. She feels improved in her daytime somnolence and her sleep consolidation as well as daytime energy level. She had an initial adjustment with respect to using CPAP and pressure. She denies any significant restless leg symptoms but has had some restless leg syndrome type symptoms with leg movements reported prior to falling asleep. This is not a big problem at this time. We will monitor for RLS symptoms. She did have significant PLMS during both sleep studies. Sometimes treatment of sleep apnea with CPAP improves PLMS with time. Again, I think we can monitor her symptoms. She is encouraged to continue with CPAP. She is congratulated on her CPAP adherence. We talked about her compliance data as well today.  She is advised to drink more water and work on weight loss.  I again explained the risks and ramifications of untreated moderate to severe OSA, especially with respect to developing cardiovascular disease down the Road, including congestive heart failure, difficult to treat hypertension, cardiac arrhythmias, or stroke. Even type 2 diabetes has, in part, been linked to untreated  OSA. Symptoms of untreated OSA include daytime sleepiness, memory problems, mood irritability and mood disorder such as depression and anxiety, lack of energy, as well as recurrent headaches, especially morning headaches. We talked about trying to maintain a healthy lifestyle in general, as well as the importance of weight control. I also explained the importance of being compliant with PAP treatment, not only for insurance purposes but primarily to improve Her symptoms, and for the patient's long term health benefit, including to reduce Her cardiovascular risks. I would like to see her back in 6 months, sooner if the need arises. I answered all her questions today and she was in agreement. I spent 25 minutes in total face-to-face time with the patient, more than 50% of which was spent in counseling and coordination of care, reviewing test results, reviewing medication and discussing or reviewing the diagnosis of OSA and RLS, the prognosis and treatment options.

## 2015-06-10 ENCOUNTER — Telehealth: Payer: Self-pay

## 2015-06-10 NOTE — Telephone Encounter (Signed)
I spoke to patient and reminded her to bring in her CPAP or memory card to appt tomorrow. Also reminded her of appt time.

## 2015-06-11 ENCOUNTER — Encounter: Payer: Self-pay | Admitting: Neurology

## 2015-06-11 ENCOUNTER — Ambulatory Visit (INDEPENDENT_AMBULATORY_CARE_PROVIDER_SITE_OTHER): Payer: BC Managed Care – PPO | Admitting: Neurology

## 2015-06-11 VITALS — BP 128/76 | HR 78 | Resp 16 | Ht 63.0 in | Wt 192.0 lb

## 2015-06-11 DIAGNOSIS — Z9989 Dependence on other enabling machines and devices: Principal | ICD-10-CM

## 2015-06-11 DIAGNOSIS — G4733 Obstructive sleep apnea (adult) (pediatric): Secondary | ICD-10-CM

## 2015-06-11 NOTE — Patient Instructions (Signed)

## 2015-06-11 NOTE — Progress Notes (Signed)
Subjective:    Patient ID: MCKALA PANTALEON is a 60 y.o. female.  HPI     Interim history:   Ms. Longie is a 60 year old right-handed woman with an underlying medical history of major depression, panic disorder, hypertension, carpal tunnel syndrome, status post surgery on the right wrist, left sided plantar fasciitis, and obesity, who presents for follow-up consultation of her obstructive sleep apnea, after her recent sleep studies. The patient is unaccompanied today. I last saw her on 12/04/2014, at which time she reported doing well, and she had adjusted to CPAP therapy. She felt she was sleeping better. Her weight is stable. She did not have significant restless leg symptoms recently even though she had significant PLMS and both sleep studies. She was compliant with CPAP therapy. I suggested we monitor for restless leg symptoms flareup.  Today, 06/11/2015: I reviewed her CPAP compliance data from 05/12/2015 through 06/10/2015 which is a total of 30 days during which time she used her machine every night with percent used days greater than 4 hours at 96.7%, indicating excellent compliance with an average usage of 7 hours and 13 minutes, residual AHI 1.1 per hour, leak at times high.  Today, 06/11/2015: She reports doing well. She feels that she sleeps much better with CPAP and does not like to sleep without it. She has no new complaints.  Previously:  I first met her on 07/31/2014 at the request of her psychiatrist, at which time the patient reported snoring, poor sleep consolidation, excessive daytime somnolence and witnessed apneic pauses while asleep. I invited her back for sleep study. She had a baseline sleep study, followed by a CPAP titration study and I went over her test results with her in detail today. Her baseline sleep study from 08/20/2014 showed a sleep efficiency of 89.6% with a latency to sleep of 34 minutes and wake after sleep onset of 10.5 minutes with mild sleep fragmentation noted.  She had an increased percentage of slow-wave sleep and a normal percentage of REM sleep with a normal REM latency. She had moderate PLMS with mild arousals with a PLM index of 35.2 per hour and a PLM associated arousal index of 4.1 per hour. She had no significant EKG or EEG changes. She had mild to moderate and at times loud snoring. Total AHI was 10.7 per hour, rising to 26.7 per hour during REM sleep and 32.2 per hour in the supine position. Baseline oxygen saturation was 92%, nadir was 71% during REM sleep. Based on her test results I invited her back for a full night CPAP titration study. She had this on 09/17/2014. Sleep efficiency was 88%, latency to sleep of 16.5 minutes and wake after sleep onset was 38 minutes with mild to moderate sleep fragmentation noted. She had a normal arousal index. She had an increased percentage of slow-wave sleep and an increased percentage of REM sleep with a normal REM latency. She had severe PLMS with an index of 74.1 per hour, with an associated arousal index of only 3.7 per hour. Average oxygen saturation was 95%, nadir was 90%. CPAP was titrated from a pressure of 5 cm to 15 cm. Her AHI was 2.2 per hour on a pressure of 13 cm. Based on her test results are prescribed CPAP for home use.  I reviewed her CPAP compliance data from 11/03/2014 through 12/02/2014 which is a total of 30 days during which time she used her machine every night with percent used days greater than 4 hours at 80%,  indicating very good compliance with an average usage of 5 hours and 12 minutes, residual AHI low at 0.8 per hour, leak at times high, pressure at 13 cm.  07/31/2014: She reports snoring, witnessed apneas, poor sleep consolidation and excessive daytime somnolence. I reviewed your office note from 06/25/2014 which you kindly included. She had a sleep study about a year ago but since then her sleep issues have become worse. She can take prolonged during the day. She is currently on Xanax  0.25 mg up to 4 times a day as needed for anxiety and Remeron 45 mg each night. She has been off of Zoloft and on Remeron for the past 1+ year. She had a sleep study over a year ago which per her report showed leg twitching at night. She does have some restless leg symptoms. She wakes up to use the bathroom perhaps once per night. She has occasional morning headaches. She does not wake up rested despite getting enough sleep. Bedtime is around 10:30 to 11 and usual price time is around 8:30. Epworth sleepiness score is 17 out of 24 today and fatigue score is 15 out of 63 today. Her father has obstructive sleep apnea and uses a CPAP machine. She had a tonsillectomy in the past. She would be willing to use a CPAP machine if the need arises. She drinks usually coffee a few times a week but does not have to have caffeine every day. She is a nonsmoker and drinks alcohol very rarely. 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 Snoring can be quite loud. She makes abnormal noises in her sleep. She has some leg twitching at night   Her Past Medical History Is Significant For: Past Medical History  Diagnosis Date  . Asthma   . Hypertension   . Depression   . Anxiety     Her Past Surgical History Is Significant For: Past Surgical History  Procedure Laterality Date  . Abdominal hysterectomy    . Cholecystectomy    . Tonsillectomy    . Carpal tunnel release  2012  . Bladder surgery  2011    Her Family History Is Significant For: Family History  Problem Relation Age of Onset  . Stroke Mother   . Leukemia Mother   . Stroke Father   . Sleep apnea Father     Her Social History Is Significant For: Social History   Social History  . Marital Status: Married    Spouse Name: N/A  . Number of Children: 1  . Years of Education: BS   Social History Main Topics  . Smoking status: Never Smoker   . Smokeless tobacco: None  . Alcohol Use: No  . Drug Use: No  .  Sexual Activity: Not Asked   Other Topics Concern  . None   Social History Narrative   Occasionally consumes tea or coffee    Her Allergies Are:  Allergies  Allergen Reactions  . Codeine Itching  . Prednisone Itching  :   Her Current Medications Are:  Outpatient Encounter Prescriptions as of 06/11/2015  Medication Sig  . ALPRAZolam (XANAX) 0.25 MG tablet Take 0.25 mg by mouth at bedtime as needed.  [name] atenolol (TENORMIN) 50 MG tablet Take 50 mg by mouth daily.  . calcium carbonate (OS-CAL) 600 MG TABS tablet Take 600 mg by mouth 2 (two) times daily with a meal.  . fluticasone (FLONASE) 50 MCG/ACT nasal spray Place 2 sprays into the nose daily.  [name]  mirtazapine (REMERON) 45 MG tablet TK 1 T PO QD HS  . Multiple Vitamin (MULTIVITAMIN) capsule Take 1 capsule by mouth daily.  . [DISCONTINUED] HYDROcodone-acetaminophen (NORCO/VICODIN) 5-325 MG per tablet TK 1 T PO  Q 6 TO 8 H PRN P   No facility-administered encounter medications on file as of 06/11/2015.  :  Review of Systems:  Out of a complete 14 point review of systems, all are reviewed and negative with the exception of these symptoms as listed below:   Review of Systems  Neurological:       Patient is here for f/u. No new concerns.     Objective:  Neurologic Exam  Physical Exam Physical Examination:   Filed Vitals:   06/11/15 1315  BP: 128/76  Pulse: 78  Resp: 16    General Examination: The patient is a very pleasant 60 y.o. female in no acute distress. She appears well-developed and well-nourished and well groomed. She is in good spirits today.   HEENT: Normocephalic, atraumatic, pupils are equal, round and reactive to light and accommodation. Extraocular tracking is good without limitation to gaze excursion or nystagmus noted. Normal smooth pursuit is noted. Hearing is grossly intact. Face is symmetric with normal facial animation and normal facial sensation. Speech is clear with no dysarthria noted. There is no  hypophonia. There is no lip, neck/head, jaw or voice tremor. Neck is supple with full range of passive and active motion. There are no carotid bruits on auscultation. Oropharynx exam reveals: mild mouth dryness, good dental hygiene and mild airway crowding, due to narrow airway entry and redundant soft palate. Mallampati is class II. Tongue protrudes centrally and palate elevates symmetrically. Tonsils are absent. She has a Absent overbite.   Chest: Clear to auscultation without wheezing, rhonchi or crackles noted.  Heart: S1+S2+0, regular and normal without murmurs, rubs or gallops noted.   Abdomen: Soft, non-tender and non-distended with normal bowel sounds appreciated on auscultation.  Extremities: There is no pitting edema in the distal lower extremities bilaterally. Pedal pulses are intact. She has mild nonpitting puffiness around the ankles.  Skin: Warm and dry without trophic changes noted. There are no varicose veins. Slight rash LLE from sun exposure she says.  Musculoskeletal: exam reveals no obvious joint deformities, tenderness or joint swelling or erythema.   Neurologically:  Mental status: The patient is awake, alert and oriented in all 4 spheres. Her immediate and remote memory, attention, language skills and fund of knowledge are appropriate. There is no evidence of aphasia, agnosia, apraxia or anomia. Speech is clear with normal prosody and enunciation. Thought process is linear. Mood is normal and affect is normal.  Cranial nerves II - XII are as described above under HEENT exam. In addition: shoulder shrug is normal with equal shoulder height noted. Motor exam: Normal bulk, strength and tone is noted. There is no drift, tremor or rebound. Romberg is negative. Reflexes are 2+ throughout. Fine motor skills and coordination: intact.  Sensory exam: intact to light touch in the upper and lower extremities.  Gait, station and balance: She stands easily. No veering to one side is  noted. No leaning to one side is noted. Posture is age-appropriate and stance is narrow based. Gait shows normal stride length and normal pace. No problems turning are noted. She turns en bloc. Tandem walk is good today.   Assessment and plan:   In summary, IVEE POELLNITZ is a very pleasant 60 year old female with an underlying medical history of major depression,  panic disorder, hypertension, carpal tunnel syndrome, status post surgery on the right wrist, left sided plantar fasciitis, and obesity, who presents for follow-up consultation of her obstructive sleep apnea. She had sleep study testing in June 2016 and a CPAP titration study in July 2016.  Physical exam is stable. She has excellent compliance with CPAP and feels that it has helped her sleep. She has no significant flare up of restless leg symptoms. She had PLMS during both her baseline and her titration study but we agreed to monitor. She is doing well from my end of things. We talked about healthy lifestyle, staying well-hydrated and regular exercise today. She is encouraged to continue to use CPAP regularly and I will see her back in a year, sooner if needed. I answered all her questions today and she was in agreement. I spent 15 minutes in total face-to-face time with the patient, more than 50% of which was spent in counseling and coordination of care, reviewing test results, reviewing medication and discussing or reviewing the diagnosis of OSA and RLS, the prognosis and treatment options.

## 2015-12-01 ENCOUNTER — Ambulatory Visit (INDEPENDENT_AMBULATORY_CARE_PROVIDER_SITE_OTHER): Payer: BC Managed Care – PPO | Admitting: Orthopedic Surgery

## 2015-12-01 DIAGNOSIS — M25561 Pain in right knee: Secondary | ICD-10-CM | POA: Diagnosis not present

## 2015-12-24 ENCOUNTER — Other Ambulatory Visit (INDEPENDENT_AMBULATORY_CARE_PROVIDER_SITE_OTHER): Payer: Self-pay | Admitting: Orthopedic Surgery

## 2015-12-26 NOTE — Telephone Encounter (Signed)
Ok to refill 

## 2015-12-29 MED ORDER — NABUMETONE 500 MG PO TABS: ORAL_TABLET | ORAL | 0 refills | Status: DC

## 2015-12-29 NOTE — Addendum Note (Signed)
Addended byCherre Huger: Hollan Philipp on: 12/29/2015 08:31 AM   Modules accepted: Orders

## 2016-02-20 ENCOUNTER — Other Ambulatory Visit (INDEPENDENT_AMBULATORY_CARE_PROVIDER_SITE_OTHER): Payer: Self-pay | Admitting: Orthopedic Surgery

## 2016-03-31 ENCOUNTER — Telehealth (INDEPENDENT_AMBULATORY_CARE_PROVIDER_SITE_OTHER): Payer: Self-pay | Admitting: *Deleted

## 2016-03-31 DIAGNOSIS — M25562 Pain in left knee: Principal | ICD-10-CM

## 2016-03-31 DIAGNOSIS — G8929 Other chronic pain: Secondary | ICD-10-CM

## 2016-03-31 NOTE — Telephone Encounter (Signed)
Per last office note ok to order MRI, I have entered order.  IC patient and advised.

## 2016-03-31 NOTE — Telephone Encounter (Signed)
Patient called in this morning in regards to wanting to go ahead and get the MRI done on her left knee. She is not getting any better and is having quite a bit of pain. Her CB # [phone]. Thank you

## 2016-03-31 NOTE — Addendum Note (Signed)
Addended byCherre Huger: Tekelia Kareem on: 03/31/2016 04:26 PM   Modules accepted: Orders

## 2016-04-09 ENCOUNTER — Other Ambulatory Visit (INDEPENDENT_AMBULATORY_CARE_PROVIDER_SITE_OTHER): Payer: Self-pay | Admitting: *Deleted

## 2016-04-09 ENCOUNTER — Telehealth (INDEPENDENT_AMBULATORY_CARE_PROVIDER_SITE_OTHER): Payer: Self-pay | Admitting: Orthopedic Surgery

## 2016-04-09 DIAGNOSIS — G8929 Other chronic pain: Secondary | ICD-10-CM

## 2016-04-09 DIAGNOSIS — M25561 Pain in right knee: Principal | ICD-10-CM

## 2016-04-09 NOTE — Telephone Encounter (Signed)
Pt requested a call back regarding an MRI  [phone]

## 2016-04-09 NOTE — Telephone Encounter (Signed)
Can you follow up with her? 

## 2016-04-12 NOTE — Telephone Encounter (Signed)
Noted, pt has already been scheduled per [facility]

## 2016-04-17 ENCOUNTER — Ambulatory Visit
Admission: RE | Admit: 2016-04-17 | Discharge: 2016-04-17 | Disposition: A | Discharge status: Home / Self Care | Payer: BC Managed Care – PPO | Source: Ambulatory Visit | Attending: Orthopedic Surgery | Admitting: Orthopedic Surgery

## 2016-04-17 DIAGNOSIS — G8929 Other chronic pain: Secondary | ICD-10-CM

## 2016-04-17 DIAGNOSIS — M25561 Pain in right knee: Principal | ICD-10-CM

## 2016-04-21 ENCOUNTER — Ambulatory Visit (INDEPENDENT_AMBULATORY_CARE_PROVIDER_SITE_OTHER): Payer: BC Managed Care – PPO | Admitting: Orthopedic Surgery

## 2016-04-21 ENCOUNTER — Encounter (INDEPENDENT_AMBULATORY_CARE_PROVIDER_SITE_OTHER): Payer: Self-pay | Admitting: Orthopedic Surgery

## 2016-04-21 DIAGNOSIS — M23203 Derangement of unspecified medial meniscus due to old tear or injury, right knee: Secondary | ICD-10-CM | POA: Insufficient documentation

## 2016-04-21 HISTORY — DX: Derangement of unspecified medial meniscus due to old tear or injury, right knee: M23.203

## 2016-04-21 NOTE — Progress Notes (Signed)
Office Visit Note   Patient: Miranda Berger           Date of Birth: [birth year]           MRN: [id number] Visit Date: 04/21/2016 Requested by: Shirlean Mylar, MD [address] [place], [place] [address] PCP: Frederich Chick, MD  Subjective: Chief Complaint  Patient presents with  . Right Knee - Pain, Follow-up    HPI [name] is a 61 year old female with right knee pain.  Since of [facility] she's had an MRI scan which does show a degenerative meniscal tear.  Medications took the edge off and the injection helped some but she is reporting recurrent symptoms at this time.  She's tried a brace but it didn't help.  No family history of DVT or pulmonary embolism.  Stairs are difficult for her.  She feels like the knee is catching.  Localizes the pain to the medial aspect of the knee.              Review of Systems All systems reviewed are negative as they relate to the chief complaint within the history of present illness.  Patient denies  fevers or chills.    Assessment & Plan: Visit Diagnoses:  1. Degenerative tear of medial meniscus of right knee     Plan: Impression is symptomatic right knee medial meniscal tear.  I reviewed the MRI scan with the patient.  In general I think this is a debridement will tear in a knee that does not have much other arthritis.  He does appear to involve a fairly significant portion of the meniscus.  His may be one to be debrided and then have any residual horizontal cleavage tear portion closed with scorpion passed sutures.  Risks and benefits of surgical intervention discussed with the patient including not limited to infection or vessel damage incomplete pain relief along with knee stiffness.  Estimated time out of work and time the recovery discussed.  All questions answered.  Follow-Up Instructions: No Follow-up on file.   Orders:  No orders of the defined types were placed in this encounter.  No orders of the defined types were placed in this  encounter.     Procedures: No procedures performed   Clinical Data: No additional findings.  Objective: Vital Signs: There were no vitals taken for this visit.  Physical Exam   Constitutional: Patient appears well-developed HEENT:  Head: Normocephalic Eyes:EOM are normal Neck: Normal range of motion Cardiovascular: Normal rate Pulmonary/chest: Effort normal Neurologic: Patient is alert Skin: Skin is warm Psychiatric: Patient has normal mood and affect    Ortho Exam orthopedic exam demonstrates normal gait and alignment no effusion in the knee today but there is medial joint line tenderness with positive McMurray compression testing intact extensor mechanism stable collateral crucial ligaments full range of motion palpable pedal pulses no other masses lymph adenopathy or skin changes noted in the right knee region  Specialty Comments:  No specialty comments available.  Imaging: No results found.   PMFS History: Patient Active Problem List   Diagnosis Date Noted  . Degenerative tear of medial meniscus of right knee 04/21/2016   Past Medical History:  Diagnosis Date  . Anxiety   . Asthma   . Depression   . Hypertension     Family History  Problem Relation Age of Onset  . Stroke Mother   . Leukemia Mother   . Stroke Father   . Sleep apnea Father  Past Surgical History:  Procedure Laterality Date  . ABDOMINAL HYSTERECTOMY    . BLADDER SURGERY  2011  . CARPAL TUNNEL RELEASE  2012  . CHOLECYSTECTOMY    . TONSILLECTOMY     Social History   Occupational History  . Not on file.   Social History Main Topics  . Smoking status: Never Smoker  . Smokeless tobacco: Never Used  . Alcohol use No  . Drug use: No  . Sexual activity: Not on file

## 2016-04-22 ENCOUNTER — Encounter (INDEPENDENT_AMBULATORY_CARE_PROVIDER_SITE_OTHER): Payer: Self-pay | Admitting: Orthopedic Surgery

## 2016-04-26 DIAGNOSIS — M23203 Derangement of unspecified medial meniscus due to old tear or injury, right knee: Secondary | ICD-10-CM | POA: Diagnosis not present

## 2016-04-28 ENCOUNTER — Telehealth (INDEPENDENT_AMBULATORY_CARE_PROVIDER_SITE_OTHER): Payer: Self-pay | Admitting: Orthopedic Surgery

## 2016-04-28 NOTE — Telephone Encounter (Signed)
Patient husband Onalee Huadavid called and answering service received call that patient was having severe itching, which was a reaction to medication from surgery. I brought the situation to dr.dean and wendy Dr.Dean wrote a script for dilaudid , I called patients spouse david and let him know its ready. I made a copy of script and will send it to be scanned into chart.

## 2016-05-04 ENCOUNTER — Inpatient Hospital Stay (INDEPENDENT_AMBULATORY_CARE_PROVIDER_SITE_OTHER): Payer: BC Managed Care – PPO | Admitting: Orthopaedic Surgery

## 2016-05-07 ENCOUNTER — Encounter (INDEPENDENT_AMBULATORY_CARE_PROVIDER_SITE_OTHER): Payer: Self-pay | Admitting: Orthopaedic Surgery

## 2016-05-07 ENCOUNTER — Ambulatory Visit (INDEPENDENT_AMBULATORY_CARE_PROVIDER_SITE_OTHER): Payer: BC Managed Care – PPO | Admitting: Orthopaedic Surgery

## 2016-05-07 DIAGNOSIS — M23203 Derangement of unspecified medial meniscus due to old tear or injury, right knee: Secondary | ICD-10-CM

## 2016-05-07 NOTE — Progress Notes (Signed)
Patient is 2 weeks status post right knee arthroscopy with partial medial meniscectomy. She states she has 8-9 out of 10 pain with movement. Her pain medicines make her itch. On exam her incisions are well-healed. There is no signs of infection. She has expected range of motion. I do not appreciate any significant effusion. The sutures were removed today. I recommend that she takes scheduled Advil and increase her activity as tolerated. Do want her to rest and elevate when possible. Follow-up with Dr. August Saucerean in 2 weeks.

## 2016-05-20 ENCOUNTER — Ambulatory Visit (INDEPENDENT_AMBULATORY_CARE_PROVIDER_SITE_OTHER): Payer: BC Managed Care – PPO | Admitting: Orthopedic Surgery

## 2016-05-26 ENCOUNTER — Encounter (INDEPENDENT_AMBULATORY_CARE_PROVIDER_SITE_OTHER): Payer: Self-pay | Admitting: Orthopedic Surgery

## 2016-05-26 ENCOUNTER — Ambulatory Visit (INDEPENDENT_AMBULATORY_CARE_PROVIDER_SITE_OTHER): Payer: BC Managed Care – PPO | Admitting: Orthopedic Surgery

## 2016-05-26 DIAGNOSIS — M23203 Derangement of unspecified medial meniscus due to old tear or injury, right knee: Secondary | ICD-10-CM

## 2016-05-28 NOTE — Progress Notes (Signed)
   Post-Op Visit Note   Patient: Miranda Berger           Date of Birth: Aug 28, 1955           MRN: [id number] Visit Date: 05/26/2016 PCP: Frederich Chick, MD   Assessment & Plan:  Chief Complaint:  Chief Complaint  Patient presents with  . Right Knee - Routine Post Op   Visit Diagnoses:  1. Degenerative tear of medial meniscus of right knee     Plan: Miranda Berger is a 61 year old patient right knee arthroscopy 04/26/2016.  4 weeks out.  First 2 weeks for painful.  Pain pills make her itch.  She has a lot of social stressors in her life currently.  She needs to walk to clear her head.  She is doing leg exercises.  On exam she has full active and passive range of motion with no effusion in that right knee.  Quad strength is improving but still there is some atrophy.  On her to continue with nonweightbearing exercises and we'll see her back in 6 weeks.  Follow-Up Instructions: Return in about 6 weeks (around 07/07/2016).   Orders:  No orders of the defined types were placed in this encounter.  No orders of the defined types were placed in this encounter.   Imaging: No results found.  PMFS History: Patient Active Problem List   Diagnosis Date Noted  . Degenerative tear of medial meniscus of right knee 04/21/2016   Past Medical History:  Diagnosis Date  . Anxiety   . Asthma   . Depression   . Hypertension     Family History  Problem Relation Age of Onset  . Stroke Mother   . Leukemia Mother   . Stroke Father   . Sleep apnea Father     Past Surgical History:  Procedure Laterality Date  . ABDOMINAL HYSTERECTOMY    . BLADDER SURGERY  2011  . CARPAL TUNNEL RELEASE  2012  . CHOLECYSTECTOMY    . TONSILLECTOMY     Social History   Occupational History  . Not on file.   Social History Main Topics  . Smoking status: Never Smoker  . Smokeless tobacco: Never Used  . Alcohol use No  . Drug use: No  . Sexual activity: Not on file

## 2016-06-09 ENCOUNTER — Telehealth: Payer: Self-pay

## 2016-06-09 NOTE — Telephone Encounter (Signed)
I called and left a detailed message on pt's home phone, per DPR, advising her to bring her cpap to the appt tomorrow with Dr. Frances Furbish and to call us back if there are any further questions or concerns.

## 2016-06-10 ENCOUNTER — Encounter (INDEPENDENT_AMBULATORY_CARE_PROVIDER_SITE_OTHER): Payer: Self-pay

## 2016-06-10 ENCOUNTER — Encounter: Payer: Self-pay | Admitting: Neurology

## 2016-06-10 ENCOUNTER — Ambulatory Visit (INDEPENDENT_AMBULATORY_CARE_PROVIDER_SITE_OTHER): Payer: BC Managed Care – PPO | Admitting: Neurology

## 2016-06-10 VITALS — BP 123/84 | HR 85 | Ht 63.0 in | Wt 179.0 lb

## 2016-06-10 DIAGNOSIS — Z9989 Dependence on other enabling machines and devices: Secondary | ICD-10-CM

## 2016-06-10 DIAGNOSIS — G4733 Obstructive sleep apnea (adult) (pediatric): Secondary | ICD-10-CM | POA: Diagnosis not present

## 2016-06-10 NOTE — Progress Notes (Signed)
Subjective:    Patient ID: Miranda Berger is a 61 y.o. female.  HPI     Interim history:   Miranda Berger is a 61 year old right-handed woman with an underlying medical history of major depression, panic disorder, hypertension, carpal tunnel syndrome, status post surgery on the right wrist, left sided plantar fasciitis, and obesity, who presents for follow-up consultation of her obstructive sleep apnea, on treatment with CPAP at home. The patient is unaccompanied today. I last saw her on 06/11/2015, at which time she reported doing well, we discussed her sleep study findings at the time, she was sleeping better with CPAP therapy was compliant with it. I suggested a one-year checkup.   Today, 06/10/2016 (all dictated new, as well as above notes, some dictation done in note pad or Word, outside of chart, may appear as copied):   I reviewed her CPAP compliance data from 05/11/16 through 06/09/16, which is a total of 30 days, during which time she used her machine 28 days with percent used days greater than 4 hours at 73.3%, indicating adequate compliance with an average usage of 5 hours and 4 minutes, residual AHI low at 0.5 per hour, leak acceptable, pressure of 13 cm. She reports doing well with CPAP. She has had increase in stress at home, including her elderly father, her elderly in-laws, her adoptive son. She has been seeing a Social worker and has had some changes to her medication, takes Wellbutrin 150 mg once daily, Xanax low dose twice daily and also mirtazapine for sleep. Overall average usage time for CPAP usage is almost 2 hours less than what it was last year around this time. She has had some knee pain on the right and had arthroscopic knee surgery about 6 weeks ago under Dr. Marlou Sa.  The patient's allergies, current medications, family history, past medical history, past social history, past surgical history and problem list were reviewed and updated as appropriate.   Previously (copied from previous  notes for reference):   I reviewed her CPAP compliance data from 05/12/2015 through 06/10/2015 which is a total of 30 days during which time she used her machine every night with percent used days greater than 4 hours at 96.7%, indicating excellent compliance with an average usage of 7 hours and 13 minutes, residual AHI 1.1 per hour, leak at times high.   I first met her on 07/31/2014 at the request of her psychiatrist, at which time the patient reported snoring, poor sleep consolidation, excessive daytime somnolence and witnessed apneic pauses while asleep. I invited her back for sleep study. She had a baseline sleep study, followed by a CPAP titration study and I went over her test results with her in detail today. Her baseline sleep study from 08/20/2014 showed a sleep efficiency of 89.6% with a latency to sleep of 34 minutes and wake after sleep onset of 10.5 minutes with mild sleep fragmentation noted. She had an increased percentage of slow-wave sleep and a normal percentage of REM sleep with a normal REM latency. She had moderate PLMS with mild arousals with a PLM index of 35.2 per hour and a PLM associated arousal index of 4.1 per hour. She had no significant EKG or EEG changes. She had mild to moderate and at times loud snoring. Total AHI was 10.7 per hour, rising to 26.7 per hour during REM sleep and 32.2 per hour in the supine position. Baseline oxygen saturation was 92%, nadir was 71% during REM sleep. Based on her test results I invited  her back for a full night CPAP titration study. She had this on 09/17/2014. Sleep efficiency was 88%, latency to sleep of 16.5 minutes and wake after sleep onset was 38 minutes with mild to moderate sleep fragmentation noted. She had a normal arousal index. She had an increased percentage of slow-wave sleep and an increased percentage of REM sleep with a normal REM latency. She had severe PLMS with an index of 74.1 per hour, with an associated arousal index of only  3.7 per hour. Average oxygen saturation was 95%, nadir was 90%. CPAP was titrated from a pressure of 5 cm to 15 cm. Her AHI was 2.2 per hour on a pressure of 13 cm. Based on her test results are prescribed CPAP for home use.   I reviewed her CPAP compliance data from 11/03/2014 through 12/02/2014 which is a total of 30 days during which time she used her machine every night with percent used days greater than 4 hours at 80%, indicating very good compliance with an average usage of 5 hours and 12 minutes, residual AHI low at 0.8 per hour, leak at times high, pressure at 13 cm.   07/31/2014: She reports snoring, witnessed apneas, poor sleep consolidation and excessive daytime somnolence. I reviewed your office note from 06/25/2014 which you kindly included. She had a sleep study about a year ago but since then her sleep issues have become worse. She can take prolonged during the day. She is currently on Xanax 0.25 mg up to 4 times a day as needed for anxiety and Remeron 45 mg each night. She has been off of Zoloft and on Remeron for the past 1+ year. She had a sleep study over a year ago which per her report showed leg twitching at night. She does have some restless leg symptoms. She wakes up to use the bathroom perhaps once per night. She has occasional morning headaches. She does not wake up rested despite getting enough sleep. Bedtime is around 10:30 to 11 and usual price time is around 8:30. Epworth sleepiness score is 17 out of 24 today and fatigue score is 15 out of 63 today. Her father has obstructive sleep apnea and uses a CPAP machine. She had a tonsillectomy in the past. She would be willing to use a CPAP machine if the need arises. She drinks usually coffee a few times a week but does not have to have caffeine every day. She is a nonsmoker and drinks alcohol very rarely. 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 Snoring can be quite loud. She makes abnormal noises  in her sleep. She has some leg twitching at night.   Her Past Medical History Is Significant For: Past Medical History:  Diagnosis Date  . Anxiety   . Asthma   . Depression   . Hypertension     Her Past Surgical History Is Significant For: Past Surgical History:  Procedure Laterality Date  . ABDOMINAL HYSTERECTOMY    . BLADDER SURGERY  2011  . CARPAL TUNNEL RELEASE  2012  . CHOLECYSTECTOMY    . TONSILLECTOMY      Her Family History Is Significant For: Family History  Problem Relation Age of Onset  . Stroke Mother   . Leukemia Mother   . Stroke Father   . Sleep apnea Father     Her Social History Is Significant For: Social History   Social History  . Marital status: Married    Spouse name: N/A  .  Number of children: 1  . Years of education: BS   Social History Main Topics  . Smoking status: Never Smoker  . Smokeless tobacco: Never Used  . Alcohol use No  . Drug use: No  . Sexual activity: Not Asked   Other Topics Concern  . None   Social History Narrative   Occasionally consumes tea or coffee    Her Allergies Are:  Allergies  Allergen Reactions  . Codeine Itching  . Prednisone Itching  :   Her Current Medications Are:  Outpatient Encounter Prescriptions as of 06/10/2016  Medication Sig  . ALPRAZolam (XANAX) 0.25 MG tablet Take 0.25 mg by mouth at bedtime as needed.  [name] atenolol (TENORMIN) 50 MG tablet Take 50 mg by mouth daily.  [name] buPROPion (WELLBUTRIN XL) 150 MG 24 hr tablet Take 150 mg by mouth daily.  . calcium carbonate (OS-CAL) 600 MG TABS tablet Take 600 mg by mouth 2 (two) times daily with a meal.  . fluticasone (FLONASE) 50 MCG/ACT nasal spray Place 2 sprays into the nose daily.  . mirtazapine (REMERON) 45 MG tablet TK 1 T PO QD HS  . Multiple Vitamin (MULTIVITAMIN) capsule Take 1 capsule by mouth daily.  . [DISCONTINUED] nabumetone (RELAFEN) 500 MG tablet TAKE 1 TABLET BY MOUTH UP TO TWICE DAILY AS NEEDED   No facility-administered  encounter medications on file as of 06/10/2016.   :  Review of Systems:  Out of a complete 14 point review of systems, all are reviewed and negative with the exception of these symptoms as listed below:  Review of Systems  Neurological:       Pt presents today to discuss her cpap. Pt says that her cpap is doing well.    Objective:  Neurologic Exam  Physical Exam Physical Examination:   Vitals:   06/10/16 1308  BP: 123/84  Pulse: 85    General Examination: The patient is a very pleasant 61 y.o. female in no acute distress. She appears well-developed and well-nourished and well groomed.   HEENT: Normocephalic, atraumatic, pupils are equal, round and reactive to light and accommodation. Extraocular tracking is good without limitation to gaze excursion or nystagmus noted. Normal smooth pursuit is noted. Hearing is grossly intact. Face is symmetric with normal facial animation and normal facial sensation. Speech is clear with no dysarthria noted. There is no hypophonia. There is no lip, neck/head, jaw or voice tremor. Neck is supple with full range of passive and active motion. There are no carotid bruits on auscultation. Oropharynx exam reveals: mild to moderate mouth dryness, adequate dental hygiene and mild airway crowding. Mallampati is class II. Tongue protrudes centrally and palate elevates symmetrically. Tonsils are absent.   Chest: Clear to auscultation without wheezing, rhonchi or crackles noted.  Heart: S1+S2+0, regular and normal without murmurs, rubs or gallops noted.   Abdomen: Soft, non-tender and non-distended with normal bowel sounds appreciated on auscultation.  Extremities: There is no pitting edema in the distal lower extremities bilaterally. Pedal pulses are intact.  Skin: Warm and dry without trophic changes noted.  Musculoskeletal: exam reveals no obvious joint deformities, tenderness or joint swelling or erythema, With the exception of mild right knee discomfort  and mild knee swelling on the R.   Neurologically:  Mental status: The patient is awake, alert and oriented in all 4 spheres. Her immediate and remote memory, attention, language skills and fund of knowledge are appropriate. There is no evidence of aphasia, agnosia, apraxia or anomia. Speech is clear with  normal prosody and enunciation. Thought process is linear. Mood is normal and affect is normal.  Cranial nerves II - XII are as described above under HEENT exam. In addition: shoulder shrug is normal with equal shoulder height noted. Motor exam: Normal bulk, strength and tone is noted. There is no drift, tremor or rebound. Romberg is negative. Reflexes are 1-2+ throughout, R knee not tested today. Fine motor skills and coordination: intact.  Cerebellar testing: No dysmetria or intention tremor. There is no truncal or gait ataxia.  Sensory exam: intact to light touch in the upper and lower extremities.  Gait, station and balance: She stands with slight difficulty. Slight limp on the R.   Assessment and Plan:   In summary, Miranda Berger is a very pleasant 61 y.o.-year old female with an underlying medical history of major depression, panic disorder, hypertension, carpal tunnel syndrome, status post surgery on the right wrist, left sided plantar fasciitis, and obesity, who presents for follow-up consultation of her obstructive sleep apnea. She is well established on CPAP therapy with excellent compliance, she has overall less average usage time by almost 2 hours compared to last year around this time. She does endorse increase in stress and difficulty maintaining good sleep schedule. She is working through her stressors with the help of her primary care physician and also mental health providers and had some medication changes. She had recent right knee surgery with good results, recuperating well from it. From a sleep standpoint, she had baseline sleep study testing in June 2016 and a CPAP titration study  in July 2016. Her physical exam otherwise is stable, she had PLMS but denied any significant RLS symptoms. She is advised to follow-up in one year, she can see one of our nurse practitioners at the time. I placed an order for CPAP supplies but she is asking for her DME company not to call her on a weekly basis which I completely understand. She is commended for her CPAP adherence and continues to indicate good results from sleeping with CPAP. Sometimes she takes the mask off in the middle of the night and does not realize it. Other than that she has been compliant with treatment and is encouraged to try to make enough time for sleep and keep a good sleep schedule and good sleep habits despite her stressors.  I answered all her questions today and she was in agreement.  I spent 20 minutes in total face-to-face time with the patient, more than 50% of which was spent in counseling and coordination of care, reviewing test results, reviewing medication and discussing or reviewing the diagnosis OSA, its prognosis and treatment options. Pertinent laboratory and imaging test results that were available during this visit with the patient were reviewed by me and considered in my medical decision making (see chart for details).

## 2016-06-10 NOTE — Patient Instructions (Addendum)
Please continue using your CPAP regularly. While your insurance requires that you use CPAP at least 4 hours each night on 70% of the nights, I recommend, that you not skip any nights and use it throughout the night if you can. Getting used to CPAP and staying with the treatment long term does take time and patience and discipline. Untreated obstructive sleep apnea when it is moderate to severe can have an adverse impact on cardiovascular health and raise her risk for heart disease, arrhythmias, hypertension, congestive heart failure, stroke and diabetes. Untreated obstructive sleep apnea causes sleep disruption, nonrestorative sleep, and sleep deprivation. This can have an impact on your day to day functioning and cause daytime sleepiness and impairment of cognitive function, memory loss, mood disturbance, and problems focussing. Using CPAP regularly can improve these symptoms.  We can see you in 1 year, you can see one of our nurse practitioners as you are stable. I will see you after that.     

## 2016-06-29 ENCOUNTER — Telehealth (INDEPENDENT_AMBULATORY_CARE_PROVIDER_SITE_OTHER): Payer: Self-pay | Admitting: Orthopedic Surgery

## 2016-06-29 NOTE — Telephone Encounter (Signed)
Ok thx.

## 2016-06-29 NOTE — Telephone Encounter (Signed)
I called and s/w patient she is s/p knee scope 04/26/16 and feels that she is not progressing the way she should. She feels worse than before surgery at times. She has been trying to ride bike as you suggested but she feels that the next day her pain is much worse.  Describes a constant toothache type pain. She had d/c using ice and ibuprofen. I advised her to resume ice and ibuprofen for the next few days. She has an appointment with you next week but she wanted you to be aware of what was going on and the issues she was having.

## 2016-06-29 NOTE — Telephone Encounter (Signed)
Patient called advised the pain has gotten more constant and she patient want to know what she can do to help control the pain. Patient asked if it would help to apply heat to her right knee? The number to contact patient is [phone]

## 2016-07-08 ENCOUNTER — Ambulatory Visit (INDEPENDENT_AMBULATORY_CARE_PROVIDER_SITE_OTHER): Payer: BC Managed Care – PPO | Admitting: Orthopedic Surgery

## 2016-07-08 ENCOUNTER — Encounter (INDEPENDENT_AMBULATORY_CARE_PROVIDER_SITE_OTHER): Payer: Self-pay | Admitting: Orthopedic Surgery

## 2016-07-08 DIAGNOSIS — M23203 Derangement of unspecified medial meniscus due to old tear or injury, right knee: Secondary | ICD-10-CM | POA: Diagnosis not present

## 2016-07-08 MED ORDER — BUPIVACAINE HCL 0.25 % IJ SOLN
4.0000 mL | INTRAMUSCULAR | Status: AC | PRN
  Administered 2016-07-08: 4 mL via INTRA_ARTICULAR

## 2016-07-08 MED ORDER — METHYLPREDNISOLONE ACETATE 40 MG/ML IJ SUSP
40.0000 mg | INTRAMUSCULAR | Status: AC | PRN
  Administered 2016-07-08: 40 mg via INTRA_ARTICULAR

## 2016-07-08 MED ORDER — LIDOCAINE HCL 1 % IJ SOLN
5.0000 mL | INTRAMUSCULAR | Status: AC | PRN
  Administered 2016-07-08: 5 mL

## 2016-07-08 NOTE — Progress Notes (Signed)
   Procedure Note  Patient: Miranda Berger             Date of Birth: 06/11/1955           MRN: [id number]             Visit Date: 07/08/2016  Procedures: Visit Diagnoses: Degenerative tear of medial meniscus of right knee  Large Joint Inj Date/Time: 07/08/2016 2:38 PM Performed by: Cammy CopaEAN, SCOTT GREGORY Authorized by: Cammy CopaEAN, SCOTT GREGORY   Consent Given by:  Patient Site marked: the procedure site was marked   Timeout: prior to procedure the correct patient, procedure, and site was verified   Indications:  Pain, joint swelling and diagnostic evaluation Location:  Knee Site:  R knee Prep: patient was prepped and draped in usual sterile fashion   Needle Size:  18 G Needle Length:  1.5 inches Approach:  Superolateral Ultrasound Guidance: No   Fluoroscopic Guidance: No   Arthrogram: No   Medications:  5 mL lidocaine 1 %; 4 mL bupivacaine 0.25 %; 40 mg methylPREDNISolone acetate 40 MG/ML Patient tolerance:  Patient tolerated the procedure well with no immediate complications

## 2016-07-08 NOTE — Progress Notes (Signed)
   Post-Op Visit Note   Patient: Miranda Berger           Date of Birth: 10/17/1955           MRN: [id number] Visit Date: 07/08/2016 PCP: Shirlean MylarWebb, Carol, MD   Assessment & Plan:  Chief Complaint:  Chief Complaint  Patient presents with  . Right Knee - Follow-up, Routine Post Op   Visit Diagnoses:  1. Degenerative tear of medial meniscus of right knee     Plan: [name] is now about 2 months out right knee arthroscopy.  She has fairly significant patellofemoral arthritis along with meniscal pathology.  States she is not getting any better.  She reports constant aching pain.  At times as sharp shooting pain.  She has a trip to [place] coming up in June in a month.  Using ibuprofen and cold pack.  Reports from really medial sided pain.  On exam no effusion reasonable quad strength and range of motion she does have a small early flexion contraction the right knee compared to the left by about 5.  Medial joint line tenderness present but no real mechanical symptoms with range of motion.  Patella femoral crepitus is present on the right less than the left.  Impression is right knee pain plan is injection into the knee with preapproval of Synvisc.  We'll do that in about a week or 2 so hopefully she can get some relief before she gets on the cruise to [place].  Follow-Up Instructions: Return in about 1 week (around 07/15/2016).   Orders:  No orders of the defined types were placed in this encounter.  No orders of the defined types were placed in this encounter.   Imaging: No results found.  PMFS History: Patient Active Problem List   Diagnosis Date Noted  . Degenerative tear of medial meniscus of right knee 04/21/2016   Past Medical History:  Diagnosis Date  . Anxiety   . Asthma   . Depression   . Hypertension     Family History  Problem Relation Age of Onset  . Stroke Mother   . Leukemia Mother   . Stroke Father   . Sleep apnea Father     Past Surgical History:  Procedure  Laterality Date  . ABDOMINAL HYSTERECTOMY    . BLADDER SURGERY  2011  . CARPAL TUNNEL RELEASE  2012  . CHOLECYSTECTOMY    . TONSILLECTOMY     Social History   Occupational History  . Not on file.   Social History Main Topics  . Smoking status: Never Smoker  . Smokeless tobacco: Never Used  . Alcohol use No  . Drug use: No  . Sexual activity: Not on file

## 2016-07-28 ENCOUNTER — Ambulatory Visit (INDEPENDENT_AMBULATORY_CARE_PROVIDER_SITE_OTHER): Payer: BC Managed Care – PPO | Admitting: Orthopedic Surgery

## 2016-07-28 ENCOUNTER — Encounter (INDEPENDENT_AMBULATORY_CARE_PROVIDER_SITE_OTHER): Payer: Self-pay | Admitting: Orthopedic Surgery

## 2016-07-28 DIAGNOSIS — M1711 Unilateral primary osteoarthritis, right knee: Secondary | ICD-10-CM

## 2016-07-28 DIAGNOSIS — M1710 Unilateral primary osteoarthritis, unspecified knee: Secondary | ICD-10-CM

## 2016-07-28 MED ORDER — HYLAN G-F 20 48 MG/6ML IX SOSY
48.0000 mg | PREFILLED_SYRINGE | INTRA_ARTICULAR | Status: AC | PRN
  Administered 2016-07-28: 48 mg via INTRA_ARTICULAR

## 2016-07-28 NOTE — Progress Notes (Signed)
   Procedure Note  Patient: Miranda Berger             Date of Birth: 01/30/1956           MRN: [id number]             Visit Date: 07/28/2016  Procedures: Visit Diagnoses: Unilateral primary osteoarthritis, right knee  Large Joint Inj Date/Time: 07/28/2016 9:38 AM Performed by: Prescott ParmaFIX, Miranda Authorized by: Cammy CopaEAN, SCOTT GREGORY   Location:  Knee Site:  R knee Prep: patient was prepped and draped in usual sterile fashion   Needle Size:  18 G Approach:  Anterolateral Ultrasound Guidance: No   Fluoroscopic Guidance: No   Arthrogram: No   Medications:  48 mg Hylan 48 MG/6ML

## 2016-07-30 ENCOUNTER — Telehealth (INDEPENDENT_AMBULATORY_CARE_PROVIDER_SITE_OTHER): Payer: Self-pay | Admitting: Orthopedic Surgery

## 2016-07-30 MED ORDER — TRAMADOL HCL 50 MG PO TABS: 50.0000 mg | ORAL_TABLET | Freq: Three times a day (TID) | ORAL | 0 refills | Status: DC | PRN

## 2016-07-30 NOTE — Telephone Encounter (Signed)
Does she think she needs pain medicine

## 2016-07-30 NOTE — Telephone Encounter (Signed)
Patient called advised the pain is returning and it is pretty bad. Patient said she is icing the knee and taking Ibuprofen. Patient asked what would Dr August Saucerean suggest for her to do. The number to contact patient is [phone] Cell or [phone]

## 2016-07-30 NOTE — Telephone Encounter (Signed)
She said yes, so I called in ultram

## 2016-07-30 NOTE — Telephone Encounter (Signed)
Please advise. Patient had synvisc one injection this week.

## 2016-07-30 NOTE — Telephone Encounter (Signed)
thx

## 2016-08-03 ENCOUNTER — Telehealth (INDEPENDENT_AMBULATORY_CARE_PROVIDER_SITE_OTHER): Payer: Self-pay | Admitting: Orthopedic Surgery

## 2016-08-03 NOTE — Telephone Encounter (Signed)
Patient has questions regarding the Synvisc injection she received last week.

## 2016-08-03 NOTE — Telephone Encounter (Signed)
Still having pain after gel injection. Has been icing and taking pain meds PRN. States that over the weekend she had 3 different people tell her about getting injections over 3 weeks and she was concerned that this was not something that was discussed with her. I assured her it was likely that they had received the Euflexxa series. Is this normal for her to still have discomfort?? Pain not as intense since synvisc but she is s very anxious about trip she has coming up going to [place] and the amount of walking she is going to be doing. Wants to know if there is a "game plan" in the event that she has not made much progress for when she goes on her trip.

## 2016-08-03 NOTE — Telephone Encounter (Signed)
As I told her the gel in best case will take only part of the pain away - only [place] plan is knee sleeve and pain meds pls clal thxhe can get euflexxa next time if she wants

## 2016-08-04 NOTE — Telephone Encounter (Signed)
IC no answer. LMVM with details per Dr Dean.  ?

## 2016-09-14 ENCOUNTER — Encounter (INDEPENDENT_AMBULATORY_CARE_PROVIDER_SITE_OTHER): Payer: Self-pay | Admitting: Orthopedic Surgery

## 2016-09-14 ENCOUNTER — Ambulatory Visit (INDEPENDENT_AMBULATORY_CARE_PROVIDER_SITE_OTHER): Payer: BC Managed Care – PPO | Admitting: Orthopedic Surgery

## 2016-09-14 ENCOUNTER — Ambulatory Visit (INDEPENDENT_AMBULATORY_CARE_PROVIDER_SITE_OTHER): Payer: BC Managed Care – PPO

## 2016-09-14 DIAGNOSIS — M5442 Lumbago with sciatica, left side: Secondary | ICD-10-CM | POA: Diagnosis not present

## 2016-09-14 DIAGNOSIS — M25552 Pain in left hip: Secondary | ICD-10-CM

## 2016-09-14 NOTE — Progress Notes (Signed)
Office Visit Note   Patient: Miranda Berger           Date of Birth: 05-30-1955           MRN: [id number] Visit Date: 09/14/2016 Requested by: Shirlean Mylar, MD [address] [place], [place] [address] PCP: Shirlean Mylar, MD  Subjective: No chief complaint on file.   HPI: Miranda Berger is a patient with back and left leg pain.  Describes this going on for week and a half and it is becoming intractable.  Reports constant pain.  Sitting and walking are worse.  Recliner laying is the most comfortable.  She's been taking hydrocodone and received a steroid injection which helped only marginally.  She has to sit in an angle in order to relieve the pressure on her back and left leg.  Heat helps her as well.  She reports tinging pain in the front but not below the knee.  Denies any bowel and bladder symptoms.  Has been seen and prescribed Medrol Dosepak gabapentin and Flexeril.              ROS: All systems reviewed are negative as they relate to the chief complaint within the history of present illness.  Patient denies  fevers or chills.   Assessment & Plan: Visit Diagnoses:  1. Acute left-sided low back pain with left-sided sciatica   2. Pain in left hip     Plan: Impression is low back pain with left sided radiculopathy likely due to bulging or herniated disc.  Fairly minimal degenerative changes on plain radiographs.  Plan is for her to take the Medrol Dosepak Neurontin and Flexeril.  I'll get her set up for an MRI of L-spine due to borderline intractable pain.  If she is better in 3-4 days with the medications I like her to call and cancel.  Otherwise we will plan on getting scan next week with likely epidural steroid injections to follow  Follow-Up Instructions: Return if symptoms worsen or fail to improve.   Orders:  Orders Placed This Encounter  Procedures  . XR Lumbar Spine 2-3 Views  . XR HIP UNILAT W OR W/O PELVIS 1V LEFT  . MR Lumbar Spine w/o contrast   No orders of the  defined types were placed in this encounter.     Procedures: No procedures performed   Clinical Data: No additional findings.  Objective: Vital Signs: There were no vitals taken for this visit.  Physical Exam:   Constitutional: Patient appears well-developed HEENT:  Head: Normocephalic Eyes:EOM are normal Neck: Normal range of motion Cardiovascular: Normal rate Pulmonary/chest: Effort normal Neurologic: Patient is alert Skin: Skin is warm Psychiatric: Patient has normal mood and affect    Ortho Exam: Orthopedic exam demonstrates full active and passive range of motion of the hips.  No groin pain with internal/external rotation of either leg.  Ankle dorsi and plantarflexion is intact.  Pedal pulses palpable.  No other masses lymph adenopathy or skin changes noted in the back region particularly vescicular lesions or rashes.  Specialty Comments:  No specialty comments available.  Imaging: Xr Hip Unilat W Or W/o Pelvis 1v Left  Result Date: 09/14/2016 AP pelvis lateral left hip reviewed.  No arthritis or fracture is present.  Bone quality appears intact.  Xr Lumbar Spine 2-3 Views  Result Date: 09/14/2016 AP lateral lumbar spine reviewed.  Mild calcification of the aorta is noted.  No spondylolisthesis.  Joint spaces are maintained.  Mild facet arthritis present  in the lower lumbar spine.  No compression fractures.    PMFS History: Patient Active Problem List   Diagnosis Date Noted  . Degenerative tear of medial meniscus of right knee 04/21/2016   Past Medical History:  Diagnosis Date  . Anxiety   . Asthma   . Depression   . Hypertension     Family History  Problem Relation Age of Onset  . Stroke Mother   . Leukemia Mother   . Stroke Father   . Sleep apnea Father     Past Surgical History:  Procedure Laterality Date  . ABDOMINAL HYSTERECTOMY    . BLADDER SURGERY  2011  . CARPAL TUNNEL RELEASE  2012  . CHOLECYSTECTOMY    . TONSILLECTOMY     Social  History   Occupational History  . Not on file.   Social History Main Topics  . Smoking status: Never Smoker  . Smokeless tobacco: Never Used  . Alcohol use No  . Drug use: No  . Sexual activity: Not on file

## 2016-09-15 ENCOUNTER — Ambulatory Visit (INDEPENDENT_AMBULATORY_CARE_PROVIDER_SITE_OTHER): Payer: BC Managed Care – PPO | Admitting: Orthopedic Surgery

## 2016-09-21 ENCOUNTER — Ambulatory Visit
Admission: RE | Admit: 2016-09-21 | Discharge: 2016-09-21 | Disposition: A | Discharge status: Home / Self Care | Payer: BC Managed Care – PPO | Source: Ambulatory Visit | Attending: Orthopedic Surgery | Admitting: Orthopedic Surgery

## 2016-09-21 DIAGNOSIS — M25552 Pain in left hip: Secondary | ICD-10-CM

## 2016-09-21 DIAGNOSIS — M5442 Lumbago with sciatica, left side: Secondary | ICD-10-CM

## 2016-09-22 ENCOUNTER — Telehealth (INDEPENDENT_AMBULATORY_CARE_PROVIDER_SITE_OTHER): Payer: Self-pay

## 2016-09-22 DIAGNOSIS — M545 Low back pain: Secondary | ICD-10-CM

## 2016-09-22 NOTE — Telephone Encounter (Signed)
Patient has disc bulge on the affected side and I would like to send her to Dr. Alvester MorinNewton for injection please call thanks and arrange for referral

## 2016-09-22 NOTE — Telephone Encounter (Signed)
Talked with patient and advised patient of message concerning referral to Dr. Alvester MorinNewton for injection.  Advise patient that she will receive a call from Dr.  BlasNewton's office to schedule an appointment.  Thank You.

## 2016-09-22 NOTE — Addendum Note (Signed)
Addended byPrescott Parma: Adell Koval on: 09/22/2016 05:23 PM   Modules accepted: Orders

## 2016-09-22 NOTE — Telephone Encounter (Signed)
Referral for Dr Alvester MorinNewton submitted.

## 2016-09-22 NOTE — Telephone Encounter (Signed)
Patient called wanting to know results of MRI scan. Is in a lot of pain and wanted to know what is treatment plan.

## 2016-09-24 ENCOUNTER — Ambulatory Visit (INDEPENDENT_AMBULATORY_CARE_PROVIDER_SITE_OTHER): Payer: BC Managed Care – PPO | Admitting: Physical Medicine and Rehabilitation

## 2016-09-24 ENCOUNTER — Encounter (INDEPENDENT_AMBULATORY_CARE_PROVIDER_SITE_OTHER): Payer: Self-pay | Admitting: Physical Medicine and Rehabilitation

## 2016-09-24 ENCOUNTER — Ambulatory Visit (INDEPENDENT_AMBULATORY_CARE_PROVIDER_SITE_OTHER): Payer: Self-pay

## 2016-09-24 VITALS — BP 105/72 | HR 93 | Temp 98.6°F

## 2016-09-24 DIAGNOSIS — M5116 Intervertebral disc disorders with radiculopathy, lumbar region: Secondary | ICD-10-CM

## 2016-09-24 DIAGNOSIS — M5416 Radiculopathy, lumbar region: Secondary | ICD-10-CM | POA: Diagnosis not present

## 2016-09-24 MED ORDER — DEXAMETHASONE SODIUM PHOSPHATE 10 MG/ML IJ SOLN
15.0000 mg | Freq: Once | INTRAMUSCULAR | Status: AC
  Administered 2016-09-24: 15 mg

## 2016-09-24 MED ORDER — LIDOCAINE HCL (PF) 1 % IJ SOLN
2.0000 mL | Freq: Once | INTRAMUSCULAR | Status: AC
  Administered 2016-09-24: 2 mL

## 2016-09-24 NOTE — Progress Notes (Deleted)
Having in pain in left buttock, low back, today nerve is hurting down to the level of the left knee.

## 2016-09-24 NOTE — Progress Notes (Unsigned)
Fluoro time: 25 sec MGy: 19.41

## 2016-09-24 NOTE — Patient Instructions (Signed)

## 2016-09-25 NOTE — Procedures (Signed)
Mrs. is a 61 year old female with left buttock low back and pain radiating to the knee and just past the knee on the left side. She has been followed by Dr. August Saucerean and has felt conservative care otherwise and he did obtain an MRI of the lumbar spine. The patient's were little bit confused while they were here. They have not seen her MRI and I did review this at length with them and did show them the pictures. There is basically a small foraminal protrusion herniation at L2-3 on the left. She also had facet arthropathy at L4-5 without listhesis. She also has a transitional L5 segment which is sacralized. This would be a diagnostic and hopefully therapeutic left L2 transforaminal epidural steroid injection. Depending on the relief she would follow up with Dr. August Saucerean. Facet joint injection could be performed diagnostically. She is in a significant amount of pain with sharp shooting pain at times. She has had no prior lumbar surgery. No prior injections. Exam is nonfocal. Lumbosacral Transforaminal Epidural Steroid Injection - Sub-Pedicular Approach with Fluoroscopic Guidance  Patient: Miranda Berger      Date of Birth: 08/18/1955 MRN: [id number] PCP: Shirlean MylarWebb, Carol, MD      Visit Date: 09/24/2016   Universal Protocol:    Date/Time: 08/04/187:29 AM  Consent Given By: the patient  Position: prone  Additional Comments: Vital signs were monitored before and after the procedure. Patient was prepped and draped in the usual sterile fashion. The correct patient, procedure, and site was verified.   Injection Procedure Details:  Procedure Site One Meds Administered:  Meds ordered this encounter  Medications  . lidocaine (PF) (XYLOCAINE) 1 % injection 2 mL  . dexamethasone (DECADRON) injection 15 mg    Laterality: Left  Location/Site:  L2-L3  Needle size: 22 G  Needle type: Spinal  Needle Placement: Transforaminal  Findings:  -Contrast Used: 1 mL iohexol 180 mg iodine/mL   -Comments: Excellent flow  of contrast along the nerve and into the epidural space. Patient did have decreased sharp symptoms during the anesthetic phase.  Procedure Details: After squaring off the end-plates to get a true AP view, the C-arm was positioned so that an oblique view of the foramen as noted above was visualized. The target area is just inferior to the "nose of the scotty dog" or sub pedicular. The soft tissues overlying this structure were infiltrated with 2-3 ml. of 1% Lidocaine without Epinephrine.  The spinal needle was inserted toward the target using a "trajectory" view along the fluoroscope beam.  Under AP and lateral visualization, the needle was advanced so it did not puncture dura and was located close the 6 O'Clock position of the pedical in AP tracterory. Biplanar projections were used to confirm position. Aspiration was confirmed to be negative for CSF and/or blood. A 1-2 ml. volume of Isovue-250 was injected and flow of contrast was noted at each level. Radiographs were obtained for documentation purposes.   After attaining the desired flow of contrast documented above, a 0.5 to 1.0 ml test dose of 0.25% Marcaine was injected into each respective transforaminal space.  The patient was observed for 90 seconds post injection.  After no sensory deficits were reported, and normal lower extremity motor function was noted,   the above injectate was administered so that equal amounts of the injectate were placed at each foramen (level) into the transforaminal epidural space.   Additional Comments:  The patient tolerated the procedure well Dressing: Band-Aid    Post-procedure details: Patient was  observed during the procedure. Post-procedure instructions were reviewed.  Patient left the clinic in stable condition.

## 2016-09-28 ENCOUNTER — Other Ambulatory Visit: Payer: BC Managed Care – PPO

## 2016-09-30 ENCOUNTER — Telehealth (INDEPENDENT_AMBULATORY_CARE_PROVIDER_SITE_OTHER): Payer: Self-pay | Admitting: Physical Medicine and Rehabilitation

## 2016-09-30 NOTE — Telephone Encounter (Signed)
Looked at injection pictures and see no issues, if pain is worse but in same location then likely injections has not helped. Will need follow up with August Saucerean or possible spine surgeon.  If she feels it significantly worsened after shot, different location or new weakness then OV with me

## 2016-09-30 NOTE — Telephone Encounter (Signed)
Left message for pt to call back  °

## 2016-09-30 NOTE — Telephone Encounter (Signed)
Patient called and said that after her injection she got some what better and now she's in unbearable pain. She was wondering if that was normal or if it's something to be concerned about. CB # [phone]

## 2016-10-01 ENCOUNTER — Telehealth (INDEPENDENT_AMBULATORY_CARE_PROVIDER_SITE_OTHER): Payer: Self-pay | Admitting: Radiology

## 2016-10-01 NOTE — Telephone Encounter (Signed)
Patients husband, Onalee HuaDavid, called- states that patient is having a lot of constant pain since her injection, they want to know what they can do for it while they are waiting to see if the injection will work or not. --Please advise

## 2016-10-01 NOTE — Telephone Encounter (Signed)
lmom for patients husband with Dr. O[place]e BlasNewton's reponse

## 2016-10-01 NOTE — Telephone Encounter (Signed)
Tramadol from Dr. August Saucerean if they have any left, if not we can refill, 2 Aleve OTC 2 x per day and ICE and keep moving as tolerated.

## 2016-10-04 ENCOUNTER — Telehealth (INDEPENDENT_AMBULATORY_CARE_PROVIDER_SITE_OTHER): Payer: Self-pay | Admitting: Radiology

## 2016-10-04 ENCOUNTER — Ambulatory Visit (INDEPENDENT_AMBULATORY_CARE_PROVIDER_SITE_OTHER): Payer: BC Managed Care – PPO | Admitting: Orthopaedic Surgery

## 2016-10-04 ENCOUNTER — Encounter (INDEPENDENT_AMBULATORY_CARE_PROVIDER_SITE_OTHER): Payer: BC Managed Care – PPO | Admitting: Physical Medicine and Rehabilitation

## 2016-10-04 DIAGNOSIS — M5126 Other intervertebral disc displacement, lumbar region: Secondary | ICD-10-CM | POA: Diagnosis not present

## 2016-10-04 MED ORDER — GABAPENTIN 100 MG PO CAPS: 100.0000 mg | ORAL_CAPSULE | Freq: Two times a day (BID) | ORAL | 0 refills | Status: DC

## 2016-10-04 NOTE — Telephone Encounter (Signed)
I called and spoke with Onalee Huaavid and they are going to come in for OV with Dr. Ophelia CharterYates today @ 215pm.

## 2016-10-04 NOTE — Telephone Encounter (Signed)
Patients husband Onalee HuaDavid called, states that patient is in unbearable pain,she has been doing the pain meds as directed.  Please advise.

## 2016-10-04 NOTE — Progress Notes (Signed)
Office Visit Note   Patient: Miranda Berger           Date of Birth: 03/04/1955           MRN: [id number] Visit Date: 10/04/2016              Requested by: Shirlean MylarWebb, Carol, MD [address] [place], [place] [address] PCP: Shirlean MylarWebb, Carol, MD   Assessment & Plan: Visit Diagnoses:  1. Lumbar disc herniation         Left L2-3 far lateral.  Plan: We'll add some Neurontin she can continue the tramadol. The pain gets worse she can call about some Norco. Continued conservative treatment we discussed that she should get some resolution of her symptoms. Recheck 3 weeks.  Follow-Up Instructions: Return in about 3 weeks (around 10/25/2016).   Orders:  No orders of the defined types were placed in this encounter.  Meds ordered this encounter  Medications  . gabapentin (NEURONTIN) 100 MG capsule    Sig: Take 1 capsule (100 mg total) by mouth 2 (two) times daily.    Dispense:  60 capsule    Refill:  0      Procedures: No procedures performed   Clinical Data: No additional findings.   Subjective: No chief complaint on file.   HPI 61 year old female with back and left leg pain with the pain for several weeks. Recent lumbar MRI on 09/21/2016 showed small to moderate size 6 foraminal disc extrusion on the left which correspond to her pain that radiates down to her knee. She has to stay in the chair leaning over the right side with her left leg partially extended to get some relief. Some improvement with supine position. She get several days relief with epidural injection she's been using tramadol. L4-5 facet hypertrophy worse the left than right without stenosis. She denies chills or fever she's better spine position. Some relief with heat. Pain radiates down the left leg stops at the knee. No fever or chills she denies symptoms below the knee.  Review of Systems patient's had some previous knee problems with degenerative meniscus. History of depression on Wellbutrin otherwise the  remaining review of systems is negative as pertains or history of present illness.   Objective: Vital Signs: There were no vitals taken for this visit.  Physical Exam  Constitutional: She is oriented to person, place, and time. She appears well-developed.  HENT:  Head: Normocephalic.  Right Ear: External ear normal.  Left Ear: External ear normal.  Eyes: Pupils are equal, round, and reactive to light.  Neck: No tracheal deviation present. No thyromegaly present.  Cardiovascular: Normal rate.   Pulmonary/Chest: Effort normal.  Abdominal: Soft.  Musculoskeletal:  Patient has pain with reverse straight leg raising. Adductor and quads right and left are strong.  Neurological: She is alert and oriented to person, place, and time.  Skin: Skin is warm and dry.  Psychiatric: She has a normal mood and affect. Her behavior is normal.    Ortho Exam knee and ankle jerk are 2+ and symmetrical anterior tib EHL is strong distal pulses are intact no pitting edema.  Specialty Comments:  No specialty comments available.  Imaging: No results found.   PMFS History: Patient Active Problem List   Diagnosis Date Noted  . Degenerative tear of medial meniscus of right knee 04/21/2016   Past Medical History:  Diagnosis Date  . Anxiety   . Asthma   . Depression   . Hypertension  Family History  Problem Relation Age of Onset  . Stroke Mother   . Leukemia Mother   . Stroke Father   . Sleep apnea Father     Past Surgical History:  Procedure Laterality Date  . ABDOMINAL HYSTERECTOMY    . BLADDER SURGERY  2011  . CARPAL TUNNEL RELEASE  2012  . CHOLECYSTECTOMY    . TONSILLECTOMY     Social History   Occupational History  . Not on file.   Social History Main Topics  . Smoking status: Never Smoker  . Smokeless tobacco: Never Used  . Alcohol use No  . Drug use: No  . Sexual activity: Not on file

## 2016-10-04 NOTE — Telephone Encounter (Signed)
Can ask August SaucerDean if he is here what he prefers but [facility]

## 2016-10-26 ENCOUNTER — Ambulatory Visit (INDEPENDENT_AMBULATORY_CARE_PROVIDER_SITE_OTHER): Payer: BC Managed Care – PPO | Admitting: Orthopaedic Surgery

## 2017-02-16 ENCOUNTER — Ambulatory Visit (INDEPENDENT_AMBULATORY_CARE_PROVIDER_SITE_OTHER): Payer: BC Managed Care – PPO | Admitting: Surgery

## 2017-02-16 ENCOUNTER — Ambulatory Visit (INDEPENDENT_AMBULATORY_CARE_PROVIDER_SITE_OTHER): Payer: BC Managed Care – PPO

## 2017-02-16 ENCOUNTER — Encounter (INDEPENDENT_AMBULATORY_CARE_PROVIDER_SITE_OTHER): Payer: Self-pay | Admitting: Surgery

## 2017-02-16 DIAGNOSIS — G8929 Other chronic pain: Secondary | ICD-10-CM | POA: Diagnosis not present

## 2017-02-16 DIAGNOSIS — M25561 Pain in right knee: Secondary | ICD-10-CM | POA: Diagnosis not present

## 2017-02-16 MED ORDER — LIDOCAINE HCL 1 % IJ SOLN
3.0000 mL | INTRAMUSCULAR | Status: AC | PRN
  Administered 2017-02-16: 3 mL

## 2017-02-16 MED ORDER — METHYLPREDNISOLONE ACETATE 40 MG/ML IJ SUSP
40.0000 mg | INTRAMUSCULAR | Status: AC | PRN
  Administered 2017-02-16: 40 mg via INTRA_ARTICULAR

## 2017-02-16 MED ORDER — BUPIVACAINE HCL 0.25 % IJ SOLN
6.0000 mL | INTRAMUSCULAR | Status: AC | PRN
  Administered 2017-02-16: 6 mL via INTRA_ARTICULAR

## 2017-02-16 NOTE — Progress Notes (Signed)
Office Visit Note   Patient: Miranda Berger           Date of Birth: 06/11/1955           MRN: [id number] Visit Date: 02/16/2017              Requested by: Maurice Small, MD [address] [place], [place] [address] PCP: Maurice Small, MD   Assessment & Plan: Visit Diagnoses:  1. Chronic pain of right knee     Plan: With patient's ongoing knee pain and swelling I decided to perform aspiration and intra-articular Marcaine/Depo-Medrol injection. Procedure tolerated well without complication. Aspirated about 30 mL of serous somewhat cloudy fluid. Joint fluid will be sent to the lab for analysis. Also did blood work to check CBC and arthritis panel.  Patient will follow-up with Dr. Marlou Sa to discuss results. If labs are unremarkable we may need to consider repeating knee MRI.  Follow-Up Instructions: No Follow-up on file.   Orders:  Orders Placed This Encounter  Procedures  . XR KNEE 3 VIEW RIGHT  . CBC  . Antinuclear Antib (ANA)  . Uric acid  . Rheumatoid Factor  . Sed Rate (ESR)   No orders of the defined types were placed in this encounter.     Procedures: Large Joint Inj: R knee on 02/16/2017 1:36 PM Indications: pain and joint swelling Details: 22 G 1.5 in needle, superolateral approach Medications: 3 mL lidocaine 1 %; 40 mg methylPREDNISolone acetate 40 MG/ML; 6 mL bupivacaine 0.25 % Aspirate: serous and cloudy; sent for lab analysis Outcome: tolerated well, no immediate complications Consent was given by the patient. Patient was prepped and draped in the usual sterile fashion.       Clinical Data: No additional findings.   Subjective: Chief Complaint  Patient presents with  . Right Knee - Pain    HPI Patient returns with ongoing right knee pain and swelling. Status post right knee arthroscopy with debridement by Dr. Marlou Sa February 2018.  Patient had right knee intra-articular Marcaine/Depo-Medrol injection performed 10/08/2016 and also had Visco  supplementation injection 07/28/2016 before trip to [place]. States that she's continued to have ongoing right knee pain and swelling. Never really had good improvement postop. 01/22/2017 while going to the airport on vacation was pulling her luggage and she aggravated her knee. Increased pain after that. Pain throughout the whole knee but mostly medial. Swelling and stiffness. Pain with weightbearing, squatting, pivoting. Has had some soreness in her right ankle joint as well. No injury there.  Review of Systems No current cardiac pulmonary GI GU issues  Objective: Vital Signs: There were no vitals taken for this visit.  Physical Exam  Constitutional: She is oriented to person, place, and time. No distress.  HENT:  Head: Normocephalic and atraumatic.  Eyes: EOM are normal. Pupils are equal, round, and reactive to light.  Pulmonary/Chest: No respiratory distress.  Musculoskeletal:  Gait antalgic. Negative logroll bilateral hips. Negative straight leg raise. Right knee she has 2-3+ effusion. Knee diffusely tender mostly on the joint line. No signs of infection. Limits are stable. Calf nontender. Left knee unremarkable. Right ankle good range of motion. She does have some tenderness along the anterior tibiotalar joint line.  Neurological: She is alert and oriented to person, place, and time.  Skin: Skin is warm and dry.  Psychiatric: She has a normal mood and affect.    Ortho Exam  Specialty Comments:  No specialty comments available.  Imaging: No results found.  PMFS History: Patient Active Problem List   Diagnosis Date Noted  . Degenerative tear of medial meniscus of right knee 04/21/2016   Past Medical History:  Diagnosis Date  . Anxiety   . Asthma   . Depression   . Hypertension     Family History  Problem Relation Age of Onset  . Stroke Mother   . Leukemia Mother   . Stroke Father   . Sleep apnea Father     Past Surgical History:  Procedure Laterality Date  .  ABDOMINAL HYSTERECTOMY    . BLADDER SURGERY  2011  . CARPAL TUNNEL RELEASE  2012  . CHOLECYSTECTOMY    . TONSILLECTOMY     Social History   Occupational History  . Not on file  Tobacco Use  . Smoking status: Never Smoker  . Smokeless tobacco: Never Used  Substance and Sexual Activity  . Alcohol use: No    Alcohol/week: 0.0 oz  . Drug use: No  . Sexual activity: Not on file

## 2017-02-16 NOTE — Addendum Note (Signed)
Addended by: Cherre HugerMAY, Athel Merriweather E on: 02/16/2017 01:54 PM   Modules accepted: Orders

## 2017-02-17 LAB — SYNOVIAL CELL COUNT + DIFF, W/ CRYSTALS
BASOPHILS, %: 0 %
Eosinophils-Synovial: 0 % (ref 0–2)
LYMPHOCYTES-SYNOVIAL FLD: 39 % (ref 0–74)
MONOCYTE/MACROPHAGE: 49 % (ref 0–69)
Neutrophil, Synovial: 1 % (ref 0–24)
SYNOVIOCYTES, %: 11 % (ref 0–15)
WBC, Synovial: 589 cells/uL — ABNORMAL HIGH (ref <150)

## 2017-02-17 LAB — CBC
HEMATOCRIT: 35.3 % (ref 35.0–45.0)
Hemoglobin: 12 g/dL (ref 11.7–15.5)
MCH: 29.9 pg (ref 27.0–33.0)
MCHC: 34 g/dL (ref 32.0–36.0)
MCV: 87.8 fL (ref 80.0–100.0)
MPV: 11.9 fL (ref 7.5–12.5)
PLATELETS: 210 10*3/uL (ref 140–400)
RBC: 4.02 10*6/uL (ref 3.80–5.10)
RDW: 11.9 % (ref 11.0–15.0)
WBC: 4.7 10*3/uL (ref 3.8–10.8)

## 2017-02-17 LAB — SEDIMENTATION RATE: Sed Rate: 9 mm/h (ref 0–30)

## 2017-02-17 LAB — URIC ACID: Uric Acid, Serum: 4.5 mg/dL (ref 2.5–7.0)

## 2017-02-17 LAB — ANA: Anti Nuclear Antibody(ANA): NEGATIVE

## 2017-02-17 LAB — GRAM STAIN
MICRO NUMBER:: 81448343
SPECIMEN QUALITY:: ADEQUATE

## 2017-02-17 LAB — TIQ-NTM

## 2017-02-17 LAB — RHEUMATOID FACTOR: Rhuematoid fact SerPl-aCnc: <14 IU/mL (ref <14)

## 2017-03-07 ENCOUNTER — Ambulatory Visit (INDEPENDENT_AMBULATORY_CARE_PROVIDER_SITE_OTHER): Payer: BC Managed Care – PPO | Admitting: Orthopedic Surgery

## 2017-03-07 ENCOUNTER — Encounter (INDEPENDENT_AMBULATORY_CARE_PROVIDER_SITE_OTHER): Payer: Self-pay | Admitting: Orthopedic Surgery

## 2017-03-07 DIAGNOSIS — M1710 Unilateral primary osteoarthritis, unspecified knee: Secondary | ICD-10-CM

## 2017-03-07 NOTE — Progress Notes (Signed)
Office Visit Note   Patient: Miranda Berger           Date of Birth: 09/26/1955           MRN: [id number] Visit Date: 03/07/2017 Requested by: Shirlean MylarWebb, Carol, MD [address] [place], [place] [address] PCP: Shirlean MylarWebb, Carol, MD  Subjective: Chief Complaint  Patient presents with  . Right Knee - Follow-up    HPI: Miranda Berger is a 62 year old patient with right knee pain.  She had knee aspiration and injection 02/16/2017.  Since I have seen her she has been on a cruise.  She is doing well.  She has noticed the return of pain at times.  She is walking.  She takes ibuprofen if needed but not on a regular basis.  Most of her pain is medial.  She denies any mechanical symptoms.              ROS: All systems reviewed are negative as they relate to the chief complaint within the history of present illness.  Patient denies  fevers or chills.   Assessment & Plan: Visit Diagnoses:  1. Arthritis of knee     Plan: Impression is improvement in right knee pain.  Plan is for observation.  We talked a lot about different methods of exercise to avoid aggravating the knee but at the same time getting the leg muscle stronger.  She tried a stationary bike but it did not help.  I think for now she is in a reasonably good place as far as pain goes.  I will see her back as needed  Follow-Up Instructions: Return if symptoms worsen or fail to improve.   Orders:  No orders of the defined types were placed in this encounter.  No orders of the defined types were placed in this encounter.     Procedures: No procedures performed   Clinical Data: No additional findings.  Objective: Vital Signs: There were no vitals taken for this visit.  Physical Exam:   Constitutional: Patient appears well-developed HEENT:  Head: Normocephalic Eyes:EOM are normal Neck: Normal range of motion Cardiovascular: Normal rate Pulmonary/chest: Effort normal Neurologic: Patient is alert Skin: Skin is  warm Psychiatric: Patient has normal mood and affect    Ortho Exam: Orthopedic exam demonstrates trace effusion right knee no effusion left knee.  5 degree flexion contracture present on the right.  Collateral cruciate ligaments stable.  Pedal pulses palpable.  No groin pain with internal/external rotation of the leg.  No other masses lymph adenopathy or skin changes noted in the right knee region  Specialty Comments:  No specialty comments available.  Imaging: No results found.   PMFS History: Patient Active Problem List   Diagnosis Date Noted  . Degenerative tear of medial meniscus of right knee 04/21/2016   Past Medical History:  Diagnosis Date  . Anxiety   . Asthma   . Depression   . Hypertension     Family History  Problem Relation Age of Onset  . Stroke Mother   . Leukemia Mother   . Stroke Father   . Sleep apnea Father     Past Surgical History:  Procedure Laterality Date  . ABDOMINAL HYSTERECTOMY    . BLADDER SURGERY  2011  . CARPAL TUNNEL RELEASE  2012  . CHOLECYSTECTOMY    . TONSILLECTOMY     Social History   Occupational History  . Not on file  Tobacco Use  . Smoking status: Never Smoker  .  Smokeless tobacco: Never Used  Substance and Sexual Activity  . Alcohol use: No    Alcohol/week: 0.0 oz  . Drug use: No  . Sexual activity: Not on file

## 2017-05-30 ENCOUNTER — Other Ambulatory Visit (HOSPITAL_COMMUNITY): Payer: Self-pay | Admitting: Obstetrics and Gynecology

## 2017-05-30 ENCOUNTER — Ambulatory Visit (HOSPITAL_COMMUNITY): Admission: RE | Admit: 2017-05-30 | Payer: BC Managed Care – PPO | Source: Ambulatory Visit

## 2017-05-30 DIAGNOSIS — R011 Cardiac murmur, unspecified: Secondary | ICD-10-CM

## 2017-06-13 ENCOUNTER — Ambulatory Visit: Payer: BC Managed Care – PPO | Admitting: Adult Health

## 2017-06-14 ENCOUNTER — Ambulatory Visit (HOSPITAL_COMMUNITY)
Admission: RE | Admit: 2017-06-14 | Discharge: 2017-06-14 | Disposition: A | Discharge status: Home / Self Care | Payer: BC Managed Care – PPO | Source: Ambulatory Visit | Attending: Obstetrics and Gynecology | Admitting: Obstetrics and Gynecology

## 2017-06-14 DIAGNOSIS — I10 Essential (primary) hypertension: Secondary | ICD-10-CM | POA: Insufficient documentation

## 2017-06-14 DIAGNOSIS — R011 Cardiac murmur, unspecified: Secondary | ICD-10-CM

## 2017-06-14 NOTE — Progress Notes (Signed)
  Echocardiogram 2D Echocardiogram has been performed.  Miranda SavoyCasey Berger Miranda Berger 06/14/2017, 11:05 AM

## 2017-06-20 DIAGNOSIS — I350 Nonrheumatic aortic (valve) stenosis: Secondary | ICD-10-CM | POA: Insufficient documentation

## 2017-06-20 HISTORY — DX: Nonrheumatic aortic (valve) stenosis: I35.0

## 2017-06-20 NOTE — Progress Notes (Signed)
Cardiology Office Note:    Date:  06/21/2017   ID:  Miranda Berger, DOB [birth year], MRN [id number]  PCP:  Miranda Mylar, MD  Cardiologist:  Miranda Herrlich, MD   Referring MD: Miranda Mylar, MD  ASSESSMENT:    1. Nonrheumatic aortic valve stenosis   2. Obstructive sleep apnea syndrome   3. Essential hypertension    PLAN:    In order of problems listed above:  1. She has newly recognized aortic stenosis clinically this is a bicuspid valve with age and family history.  She is having symptoms that are atypical chest pain shortness of breath and her Doppler parameters are discordant with a gradient that is moderate but a ratio velocity time intervals that is low in valve areas that are severe.  Her stroke volume index is diminished and she may have a low flow aortic stenosis.  Paradoxical.  For further evaluation I will asked her to have a multidetector CT coronary artery aortic valve calcium score and reassess back in the office in 1 month she will continue her beta-blocker and she will initiate endocarditis prophylaxis with dental appointment planned for later today.  She seemed reassured after the visit 2. Stable continue CPAP 3. Stable continue beta-blocker  Next appointment 4 weeks   Medication Adjustments/Labs and Tests Ordered: Current medicines are reviewed at length with the patient today.  Concerns regarding medicines are outlined above.  Orders Placed This Encounter  Procedures  . CT CARDIAC SCORING  . EKG 12-Lead  . ECHOCARDIOGRAM STRESS TEST   Meds ordered this encounter  Medications  . amoxicillin (AMOXIL) 500 MG capsule    Sig: Take 2,000 mg (4 tablets) 45 minutes prior to dental procedures.    Dispense:  12 capsule    Refill:  2     Chief Complaint  Patient presents with  . New Patient (Initial Visit)  . Cardiac Valve Problem    History of Present Illness:    Miranda Berger is a 62 y.o. female who is being seen today for the evaluation of aortic stenosis at the  request of Miranda Mylar, MD.  Recently murmurs appreciated examination leading to echocardiogram. She has 2 family members with bicuspid aortic valve including a brother who had AVR at approximately age 48.  She was never told she had valvular heart disease earlier in life.  She is having symptoms of chest pain and shortness of breath but this is been a long-term ongoing problem which she is attributed to anxiety in the past.  She gets tightness in her chest with emotional upset in physical activity and finds herself short of breath with activity such as climbing stairs pattern has not changed she has orthostatic lightheadedness but has not had syncope no fever or chills.  When she describes chest pain she recalls any tightness holds her fist to her chest.  She has had no palpitation or syncope.  Echo 06/14/17:Study Conclusions - Left ventricle: The cavity size was normal. Wall thickness was   increased in a pattern of mild LVH. Systolic function was normal.   The estimated ejection fraction was in the range of 55% to 60%.   Wall motion was normal; there were no regional wall motion   abnormalities. Findings consistent with left ventricular   diastolic dysfunction, grade indeterminate. - Aortic valve: Moderately calcified annulus. Trileaflet;   moderately calcified leaflets. There was moderate to severe   stenosis. Peak velocity (S): 339 cm/s. Mean gradient (S): 27 mm   Hg. Valve  area (VTI): 0.82 cm^2. Valve area (Vmax): 0.76 cm^2.   Valve area (Vmean): 0.75 cm^2.   VTI ratio of LVOT to aortic valve: 0.24  Stroke volume/bsa, 2-p                   24.1  ml/m^2   Past Medical History:  Diagnosis Date  . Anxiety   . Asthma   . Depression   . Hypertension     Past Surgical History:  Procedure Laterality Date  . ABDOMINAL HYSTERECTOMY    . BLADDER SURGERY  2011  . CARPAL TUNNEL RELEASE  2012  . CHOLECYSTECTOMY    . TONSILLECTOMY      Current Medications: Current Meds  Medication Sig  .  ALPRAZolam (XANAX) 0.25 MG tablet Take 0.25 mg by mouth at bedtime as needed.  [name] atenolol (TENORMIN) 50 MG tablet Take 50 mg by mouth daily.  [name] buPROPion (WELLBUTRIN XL) 150 MG 24 hr tablet Take 150 mg by mouth daily.  . calcium carbonate (OS-CAL) 600 MG TABS tablet Take 600 mg by mouth 2 (two) times daily with a meal.  . fluticasone (FLONASE) 50 MCG/ACT nasal spray Place 2 sprays into the nose daily.  . mirtazapine (REMERON) 45 MG tablet TK 1 T PO QD HS  . Multiple Vitamin (MULTIVITAMIN) capsule Take 1 capsule by mouth daily.  . traMADol (ULTRAM) 50 MG tablet Take 1 tablet (50 mg total) by mouth every 8 (eight) hours as needed.     Allergies:   Codeine and Prednisone   Social History   Socioeconomic History  . Marital status: Married    Spouse name: Not on file  . Number of children: 1  . Years of education: BS  . Highest education level: Not on file  Occupational History  . Not on file  Social Needs  . Financial resource strain: Not on file  . Food insecurity:    Worry: Not on file    Inability: Not on file  . Transportation needs:    Medical: Not on file    Non-medical: Not on file  Tobacco Use  . Smoking status: Never Smoker  . Smokeless tobacco: Never Used  Substance and Sexual Activity  . Alcohol use: No    Alcohol/week: 0.0 oz  . Drug use: No  . Sexual activity: Not on file  Lifestyle  . Physical activity:    Days per week: Not on file    Minutes per session: Not on file  . Stress: Not on file  Relationships  . Social connections:    Talks on phone: Not on file    Gets together: Not on file    Attends religious service: Not on file    Active member of club or organization: Not on file    Attends meetings of clubs or organizations: Not on file    Relationship status: Not on file  Other Topics Concern  . Not on file  Social History Narrative   Occasionally consumes tea or coffee     Family History: The patient's family history includes Heart Problems in  her maternal aunt; Leukemia in her mother; Sleep apnea in her father; Stroke in her father and mother; Valvular heart disease in her brother, maternal aunt, and paternal grandfather.  ROS:   Review of Systems  Constitution: Positive for malaise/fatigue.  HENT: Negative.   Cardiovascular: Positive for chest pain and dyspnea on exertion.  Respiratory: Positive for shortness of breath and snoring (on CPAP).   Endocrine: Negative.  Hematologic/Lymphatic: Negative.   Skin: Negative.   Musculoskeletal: Negative.   Gastrointestinal: Positive for diarrhea.  Genitourinary: Negative.   Neurological: Negative.   Psychiatric/Behavioral: The patient is nervous/anxious (wore recently).   Allergic/Immunologic: Negative.    Please see the history of present illness.     All other systems reviewed and are negative.  EKGs/Labs/Other Studies Reviewed:    The following studies were reviewed today:   EKG:  EKG is  ordered today.  The ekg ordered today demonstrates SRTH normal  Recent Labs: 02/16/2017: Hemoglobin 12.0; Platelets 210  Recent Lipid Panel No results found for: CHOL, TRIG, HDL, CHOLHDL, VLDL, LDLCALC, LDLDIRECT  Physical Exam:    VS:  BP 118/88 (BP Location: Left Arm, Patient Position: Sitting, Cuff Size: Normal)   Pulse 77   Ht  (1.626 m)   Wt 175 lb 6.4 oz (79.6 kg)   SpO2 98%   BMI 30.11 kg/m     Wt Readings from Last 3 Encounters:  06/21/17 175 lb 6.4 oz (79.6 kg)  06/10/16 179 lb (81.2 kg)  06/11/15 192 lb (87.1 kg)     GEN:  Well nourished, well developed in no acute distress HEENT: Normal NECK: No JVD; No carotid bruits LYMPHATICS: No lymphadenopathy CARDIAC: 2-3/6 SEM mid peak aortic area to R CCA s2 single harsh no AR RRR RESPIRATORY:  Clear to auscultation without rales, wheezing or rhonchi  ABDOMEN: Soft, non-tender, non-distended MUSCULOSKELETAL:  No edema; No deformity  SKIN: Warm and dry NEUROLOGIC:  Alert and oriented x 3 PSYCHIATRIC:  Normal  affect     Signed, Miranda Herrlich, MD  06/21/2017 1:35 PM    [facility]

## 2017-06-21 ENCOUNTER — Encounter: Payer: Self-pay | Admitting: Cardiology

## 2017-06-21 ENCOUNTER — Ambulatory Visit: Payer: BC Managed Care – PPO | Admitting: Cardiology

## 2017-06-21 VITALS — BP 118/88 | HR 77 | Ht 64.0 in | Wt 175.4 lb

## 2017-06-21 DIAGNOSIS — G4733 Obstructive sleep apnea (adult) (pediatric): Secondary | ICD-10-CM | POA: Diagnosis not present

## 2017-06-21 DIAGNOSIS — I10 Essential (primary) hypertension: Secondary | ICD-10-CM | POA: Insufficient documentation

## 2017-06-21 DIAGNOSIS — I350 Nonrheumatic aortic (valve) stenosis: Secondary | ICD-10-CM

## 2017-06-21 MED ORDER — AMOXICILLIN 500 MG PO CAPS: ORAL_CAPSULE | ORAL | 2 refills | Status: DC

## 2017-06-21 NOTE — Patient Instructions (Addendum)
Medication Instructions:  Your physician has recommended you make the following change in your medication:  START amoxicillin 2,000 mg (4 tablets) 45 minutes prior to dental procedure  Labwork: None  Testing/Procedures: You had an EKG today.  Your physician has requested that you have a stress echocardiogram. For further information please visit [email or web address] Please follow instruction sheet as given.  Follow-Up: Your physician recommends that you schedule a follow-up appointment in: 4 weeks.  Any Other Special Instructions Will Be Listed Below (If Applicable).     If you need a refill on your cardiac medications before your next appointment, please call your pharmacy.    Aortic Valve Stenosis Aortic valve stenosis is a narrowing of the aortic valve. The aortic valve opens and closes to regulate blood flow between the lower left chamber of the heart (left ventricle) and the blood vessel that leads away from the heart (aorta). When the aortic valve becomes narrow, it makes it difficult for the heart to pump blood into the aorta, which causes the heart to work harder. The extra work can weaken the heart over time. Aortic valve stenosis can range from mild to severe. If untreated, it can become more severe over time and can lead to heart failure. What are the causes? This condition may be caused by:  Buildup of calcium around and on the valve. This can occur with aging. This is the most common cause of aortic valve stenosis.  Birth defect.  Rheumatic fever.  Radiation to the chest.  What increases the risk? You may be more likely to develop this condition if:  You are over the age of 76.  You were born with an abnormal bicuspid valve.  What are the signs or symptoms? You may have no symptoms until your condition becomes severe. It may take 10-20 years for mild or moderate aortic valve stenosis to become severe. Symptoms may include:  Shortness of breath. This may  get worse during physical activity.  Feeling unusually weak and tired (fatigue).  Extreme discomfort in the chest, neck, or arm (angina).  A heartbeat that is irregular or faster than normal (palpitations).  Dizziness or fainting. This may happen when you get physically tired or after you take certain heart medicines, such as nitroglycerin.  How is this diagnosed? This condition may be diagnosed with:  A physical exam.  Echocardiogram. This is a type of imaging test that uses sound waves (ultrasound) to make an image of your heart. There are two types that may be used: ? Transthoracic echocardiogram (TTE). This type of echocardiogram is noninvasive, and it is usually done first. ? Transesophageal echocardiogram (TEE). This type of echocardiogram is done by passing a flexible tube down your esophagus. The heart and the esophagus are close to each other, so your health care provider can take very clear, detailed pictures of the heart using this type of test.  Cardiac catheterization. In this procedure, a thin, flexible tube (catheter) is passed through a large vein in your neck, groin, or arm. This procedure provides information about arteries, structures, blood pressure, and oxygen levels in your heart.  Electrocardiogram (ECG). This records the electrical impulses of your heart and assesses heart function.  Stress tests. These are tests that evaluate the blood supply to your heart and your heart's response to exercise.  Blood tests.  You may work with a health care provider who specializes in the heart (cardiologist). How is this treated? Treatment depends on how severe your condition is and what your  symptoms are. You will need to have your heart checked regularly to make sure that your condition is not getting worse or causing serious problems. If your condition is mild, no treatment may be needed. Treatment may include:  Medicines that help keep your heart rate  regular.  Medicines that thin your blood (anticoagulants) to prevent the formation of blood clots.  Antibiotic medicines to help prevent infection.  Surgery to replace your aortic valve. This is the most common treatment for aortic valve stenosis. Several types of surgeries are available. The surgery may be done through a large incision over your heart (open heart surgery), or it may be done using a minimally invasive technique (transcatheter aortic valve replacement, or TAVR).  Follow these instructions at home: Lifestyle   Limit alcohol intake to no more than 1 drink per day for nonpregnant women and 2 drinks per day for men. One drink equals 12 oz of beer, 5 oz of wine, or 1 oz of hard liquor.  Do not use any tobacco products, such as cigarettes, chewing tobacco, or e-cigarettes. If you need help quitting, ask your health care provider.  Work with your health care provider to manage your blood pressure and cholesterol.  Maintain a healthy weight. Eating and drinking  Follow instructions from your health care provider about eating or drinking restrictions. ? Limit how much caffeine you drink. Caffeine can affect your heart's rate and rhythm.  Drink enough fluid to keep your urine clear or pale yellow.  Eat a heart-healthy diet. This should include plenty of fresh fruits and vegetables. If you eat meat, it should be lean cuts. Avoid foods that are: ? High in salt, saturated fat, or sugar. ? Canned or highly processed. ? Fried. Activity  Return to your normal activities as told by your health care provider. Ask your health care provider what activities are safe for you.  Exercise regularly, as told by your health care provider. Ask your health care provider what types of exercise are safe for you.  If your aortic valve stenosis is mild, you may need to avoid only very intense physical activity. The more severe your aortic valve stenosis is, the more activities you may need to  avoid. General instructions  Take over-the-counter and prescription medicines only as told by your health care provider.  If you are a woman and you plan to become pregnant, talk with your health care provider before you become pregnant.  Tell all health care providers who care for you that you have aortic valve stenosis.  Keep all follow-up visits as told by your health care provider. This is important. Contact a health care provider if:  You have a fever. Get help right away if:  You develop chest pain or tightness.  You develop shortness of breath or difficulty breathing.  You feel light-headed.  You feel like you might faint.  Your heartbeat is irregular or faster than normal. These symptoms may represent a serious problem that is an emergency. Do not wait to see if the symptoms will go away. Get medical help right away. Call your local emergency services (911 in the [place] Do not drive yourself to the hospital. This information is not intended to replace advice given to you by your health care provider. Make sure you discuss any questions you have with your health care provider. Document Released: 11/07/2002 Document Revised: 07/17/2015 Document Reviewed: 01/12/2015 Elsevier Interactive Patient Education  2017 [employer].

## 2017-06-23 ENCOUNTER — Telehealth: Payer: Self-pay

## 2017-06-23 NOTE — Telephone Encounter (Signed)
Patient advised dobutamine echo changed to CT calcium score. Advised patient someone would contact her to schedule. Patient verbalized understanding. No further questions.

## 2017-06-23 NOTE — Telephone Encounter (Signed)
-----   Message from Baldo Daub, MD sent at 06/21/2017 11:10 AM EDT ----- I changed dobutmaine echo to cardiac CT score of AV and placed the order

## 2017-06-27 ENCOUNTER — Other Ambulatory Visit (HOSPITAL_COMMUNITY): Payer: BC Managed Care – PPO

## 2017-06-29 ENCOUNTER — Other Ambulatory Visit (HOSPITAL_COMMUNITY): Payer: BC Managed Care – PPO

## 2017-07-01 ENCOUNTER — Ambulatory Visit (INDEPENDENT_AMBULATORY_CARE_PROVIDER_SITE_OTHER)
Admission: RE | Admit: 2017-07-01 | Discharge: 2017-07-01 | Disposition: A | Discharge status: Home / Self Care | Payer: BC Managed Care – PPO | Source: Ambulatory Visit | Attending: Cardiology | Admitting: Cardiology

## 2017-07-01 DIAGNOSIS — I350 Nonrheumatic aortic (valve) stenosis: Secondary | ICD-10-CM

## 2017-07-05 NOTE — H&P (View-Only) (Signed)
Cardiology Office Note:    Date:  07/06/2017   ID:  [name], DOB [birth year], MRN [id number]  PCP:  Shirlean Mylar, MD  Cardiologist:  Norman Herrlich, MD    Referring MD: Shirlean Mylar, MD    ASSESSMENT:    1. Nonrheumatic aortic valve stenosis   2. Essential hypertension   3. Obstructive sleep apnea syndrome    PLAN:    In order of problems listed above:  1. She is symptomatic and has findings of severe low gradient paradoxical aortic stenosis.  Referred for heart catheterization and if appropriate surgical intervention 2. Satble continue current treatment 3. Stable continue CPAP   Next appointment: after cath and AVR   Medication Adjustments/Labs and Tests Ordered: Current medicines are reviewed at length with the patient today.  Concerns regarding medicines are outlined above.  Orders Placed This Encounter  Procedures  . DG Chest 2 View  . CBC  . Basic metabolic panel   No orders of the defined types were placed in this encounter.   Chief Complaint  Patient presents with  . Follow-up    after cardiac CT  . Aortic Stenosis    History of Present Illness:    Miranda Berger is a 62 y.o. female with a hx of aortic stenosis last seen 06/21/17.  ASSESSMENT:    1. Nonrheumatic aortic valve stenosis   2. Obstructive sleep apnea syndrome   3. Essential hypertension    PLAN:    1.   She has newly recognized aortic stenosis clinically this is a bicuspid valve with age and family history.  She is having symptoms that are atypical chest pain shortness of breath and her Doppler parameters are discordant with a gradient that is moderate but a ratio velocity time intervals that is low in valve areas that are severe.  Her stroke volume index is diminished and she may have a low flow aortic stenosis.  Paradoxical.  For further evaluation I will asked her to have a multidetector CT coronary artery aortic valve calcium score and reassess back in the office in 1 month she will  continue her beta-blocker and she will initiate endocarditis prophylaxis with dental appointment planned for later today.  She seemed reassured after the visit 4. Stable continue CPAP 5. Stable continue beta-blocker  Compliance with diet, lifestyle and medications: Yes  Her cardiac CT shows an elevated aortic valve calcium score consistent with severe aortic stenosis.  Her echocardiogram was discordant with severe stenosis by valve area and moderate stenosis by pressure gradient however she had a low stroke volume index.  She is having symptoms of exertional chest tightness shortness of breath and exercise intolerance and she acknowledges that the symptoms although they have been present for long period of time and previously vague have become more defined in our interfering with her daily life.  She is accompanied by her husband after discussion of her symptoms and the findings of severe aortic stenosis she wishes to progress to heart catheterization and surgical intervention.  She has low risk for surgery and a bicuspid aortic valve.  Fortunately her coronary artery calcium score 0 and a thoracic aorta is normal caliber.  She has a family history of 2 siblings with bicuspid valve and surgical intervention.  She is interested in techniques to minimize cosmetic appearance and I told her I did speak to CT surgery Dr. Barry Dienes today about alternatives for her care.  She has no dye allergy she has good dental care was recently  seen by her dentist taking endocarditis prophylaxis.  Her hypertension and sleep apnea are stable and treated.  Patient and her husband voiced understanding and agreement Past Medical History:  Diagnosis Date  . Anxiety   . Asthma   . Depression   . Hypertension     Past Surgical History:  Procedure Laterality Date  . ABDOMINAL HYSTERECTOMY    . BLADDER SURGERY  2011  . CARPAL TUNNEL RELEASE  2012  . CHOLECYSTECTOMY    . TONSILLECTOMY      Current Medications: Current Meds    Medication Sig  . ALPRAZolam (XANAX) 0.25 MG tablet Take 0.25 mg by mouth 2 (two) times daily as needed.   [name] amoxicillin (AMOXIL) 500 MG capsule Take 2,000 mg (4 tablets) 45 minutes prior to dental procedures.  [name] atenolol (TENORMIN) 50 MG tablet Take 50 mg by mouth daily.  [name] buPROPion (WELLBUTRIN XL) 150 MG 24 hr tablet Take 150 mg by mouth daily.  . calcium carbonate (OS-CAL) 600 MG TABS tablet Take 600 mg by mouth 2 (two) times daily with a meal.  . fluticasone (FLONASE) 50 MCG/ACT nasal spray Place 2 sprays into the nose daily.  . Fluticasone-Salmeterol (ADVAIR DISKUS) 250-50 MCG/DOSE AEPB Advair Diskus 250 mcg-50 mcg/dose powder for inhalation  INHALE 1 PUFF PO Q 12 H  . mirtazapine (REMERON) 45 MG tablet TK 1 T PO QD HS  . Multiple Vitamin (MULTIVITAMIN) capsule Take 1 capsule by mouth daily.  . traMADol (ULTRAM) 50 MG tablet Take 1 tablet (50 mg total) by mouth every 8 (eight) hours as needed.     Allergies:   Codeine and Prednisone   Social History   Socioeconomic History  . Marital status: Married    Spouse name: Not on file  . Number of children: 1  . Years of education: BS  . Highest education level: Not on file  Occupational History  . Not on file  Social Needs  . Financial resource strain: Not on file  . Food insecurity:    Worry: Not on file    Inability: Not on file  . Transportation needs:    Medical: Not on file    Non-medical: Not on file  Tobacco Use  . Smoking status: Never Smoker  . Smokeless tobacco: Never Used  Substance and Sexual Activity  . Alcohol use: No    Alcohol/week: 0.0 oz  . Drug use: No  . Sexual activity: Not on file  Lifestyle  . Physical activity:    Days per week: Not on file    Minutes per session: Not on file  . Stress: Not on file  Relationships  . Social connections:    Talks on phone: Not on file    Gets together: Not on file    Attends religious service: Not on file    Active member of club or organization: Not on file     Attends meetings of clubs or organizations: Not on file    Relationship status: Not on file  Other Topics Concern  . Not on file  Social History Narrative   Occasionally consumes tea or coffee     Family History: The patient's family history includes Heart Problems in her maternal aunt; Leukemia in her mother; Sleep apnea in her father; Stroke in her father and mother; Valvular heart disease in her brother, maternal aunt, and paternal grandfather. ROS:   Please see the history of present illness.    All other systems reviewed and are negative.  EKGs/Labs/Other  Studies Reviewed:    The following studies were reviewed today:   Aortic valve calcium score is [id number]. Coronary calcium score 0, normal thoracic aorta Recent Labs: 02/16/2017: Hemoglobin 12.0; Platelets 210  Recent Lipid Panel No results found for: CHOL, TRIG, HDL, CHOLHDL, VLDL, LDLCALC, LDLDIRECT  Physical Exam:    VS:  BP 114/80 (BP Location: Right Arm, Patient Position: Sitting, Cuff Size: Normal)   Pulse 79   Ht  (1.626 m)   Wt 175 lb (79.4 kg)   SpO2 97%   BMI 30.04 kg/m     Wt Readings from Last 3 Encounters:  07/06/17 175 lb (79.4 kg)  06/21/17 175 lb 6.4 oz (79.6 kg)  06/10/16 179 lb (81.2 kg)     GEN: Anxious Well nourished, well developed in no acute distress HEENT: Normal NECK: No JVD; No carotid bruits LYMPHATICS: No lymphadenopathy CARDIAC:  2-3/6 SEM mid peak aortic area to R CCA s2 single harsh no AR RRR confirmed on exam today RRR, no murmurs, rubs, gallops RESPIRATORY:  Clear to auscultation without rales, wheezing or rhonchi  ABDOMEN: Soft, non-tender, non-distended MUSCULOSKELETAL:  No edema; No deformity  SKIN: Warm and dry NEUROLOGIC:  Alert and oriented x 3 PSYCHIATRIC:  Normal affect    Signed, Norman Herrlich, MD  07/06/2017 11:57 AM    [facility]

## 2017-07-05 NOTE — Progress Notes (Signed)
Cardiology Office Note:    Date:  07/06/2017   ID:  Miranda Berger, DOB 12/17/1955, MRN [id number]  PCP:  Webb, Carol, MD  Cardiologist:  Miranda Gladwin, MD    Referring MD: Webb, Carol, MD    ASSESSMENT:    1. Nonrheumatic aortic valve stenosis   2. Essential hypertension   3. Obstructive sleep apnea syndrome    PLAN:    In order of problems listed above:  1. She is symptomatic and has findings of severe low gradient paradoxical aortic stenosis.  Referred for heart catheterization and if appropriate surgical intervention 2. Satble continue current treatment 3. Stable continue CPAP   Next appointment: after cath and AVR   Medication Adjustments/Labs and Tests Ordered: Current medicines are reviewed at length with the patient today.  Concerns regarding medicines are outlined above.  Orders Placed This Encounter  Procedures  . DG Chest 2 View  . CBC  . Basic metabolic panel   No orders of the defined types were placed in this encounter.   Chief Complaint  Patient presents with  . Follow-up    after cardiac CT  . Aortic Stenosis    History of Present Illness:    Miranda Berger is a 61 y.o. female with a hx of aortic stenosis last seen 06/21/17.  ASSESSMENT:    1. Nonrheumatic aortic valve stenosis   2. Obstructive sleep apnea syndrome   3. Essential hypertension    PLAN:    1.   She has newly recognized aortic stenosis clinically this is a bicuspid valve with age and family history.  She is having symptoms that are atypical chest pain shortness of breath and her Doppler parameters are discordant with a gradient that is moderate but a ratio velocity time intervals that is low in valve areas that are severe.  Her stroke volume index is diminished and she may have a low flow aortic stenosis.  Paradoxical.  For further evaluation I will asked her to have a multidetector CT coronary artery aortic valve calcium score and reassess back in the office in 1 month she will  continue her beta-blocker and she will initiate endocarditis prophylaxis with dental appointment planned for later today.  She seemed reassured after the visit 4. Stable continue CPAP 5. Stable continue beta-blocker  Compliance with diet, lifestyle and medications: Yes  Her cardiac CT shows an elevated aortic valve calcium score consistent with severe aortic stenosis.  Her echocardiogram was discordant with severe stenosis by valve area and moderate stenosis by pressure gradient however she had a low stroke volume index.  She is having symptoms of exertional chest tightness shortness of breath and exercise intolerance and she acknowledges that the symptoms although they have been present for long period of time and previously vague have become more defined in our interfering with her daily life.  She is accompanied by her husband after discussion of her symptoms and the findings of severe aortic stenosis she wishes to progress to heart catheterization and surgical intervention.  She has low risk for surgery and a bicuspid aortic valve.  Fortunately her coronary artery calcium score 0 and a thoracic aorta is normal caliber.  She has a family history of 2 siblings with bicuspid valve and surgical intervention.  She is interested in techniques to minimize cosmetic appearance and I told her I did speak to CT surgery Dr. Owens today about alternatives for her care.  She has no dye allergy she has good dental care was recently   seen by her dentist taking endocarditis prophylaxis.  Her hypertension and sleep apnea are stable and treated.  Patient and her husband voiced understanding and agreement Past Medical History:  Diagnosis Date  . Anxiety   . Asthma   . Depression   . Hypertension     Past Surgical History:  Procedure Laterality Date  . ABDOMINAL HYSTERECTOMY    . BLADDER SURGERY  2011  . CARPAL TUNNEL RELEASE  2012  . CHOLECYSTECTOMY    . TONSILLECTOMY      Current Medications: Current Meds    Medication Sig  . ALPRAZolam (XANAX) 0.25 MG tablet Take 0.25 mg by mouth 2 (two) times daily as needed.   . amoxicillin (AMOXIL) 500 MG capsule Take 2,000 mg (4 tablets) 45 minutes prior to dental procedures.  . atenolol (TENORMIN) 50 MG tablet Take 50 mg by mouth daily.  . buPROPion (WELLBUTRIN XL) 150 MG 24 hr tablet Take 150 mg by mouth daily.  . calcium carbonate (OS-CAL) 600 MG TABS tablet Take 600 mg by mouth 2 (two) times daily with a meal.  . fluticasone (FLONASE) 50 MCG/ACT nasal spray Place 2 sprays into the nose daily.  . Fluticasone-Salmeterol (ADVAIR DISKUS) 250-50 MCG/DOSE AEPB Advair Diskus 250 mcg-50 mcg/dose powder for inhalation  INHALE 1 PUFF PO Q 12 H  . mirtazapine (REMERON) 45 MG tablet TK 1 T PO QD HS  . Multiple Vitamin (MULTIVITAMIN) capsule Take 1 capsule by mouth daily.  . traMADol (ULTRAM) 50 MG tablet Take 1 tablet (50 mg total) by mouth every 8 (eight) hours as needed.     Allergies:   Codeine and Prednisone   Social History   Socioeconomic History  . Marital status: Married    Spouse name: Not on file  . Number of children: 1  . Years of education: BS  . Highest education level: Not on file  Occupational History  . Not on file  Social Needs  . Financial resource strain: Not on file  . Food insecurity:    Worry: Not on file    Inability: Not on file  . Transportation needs:    Medical: Not on file    Non-medical: Not on file  Tobacco Use  . Smoking status: Never Smoker  . Smokeless tobacco: Never Used  Substance and Sexual Activity  . Alcohol use: No    Alcohol/week: 0.0 oz  . Drug use: No  . Sexual activity: Not on file  Lifestyle  . Physical activity:    Days per week: Not on file    Minutes per session: Not on file  . Stress: Not on file  Relationships  . Social connections:    Talks on phone: Not on file    Gets together: Not on file    Attends religious service: Not on file    Active member of club or organization: Not on file     Attends meetings of clubs or organizations: Not on file    Relationship status: Not on file  Other Topics Concern  . Not on file  Social History Narrative   Occasionally consumes tea or coffee     Family History: The patient's family history includes Heart Problems in her maternal aunt; Leukemia in her mother; Sleep apnea in her father; Stroke in her father and mother; Valvular heart disease in her brother, maternal aunt, and paternal grandfather. ROS:   Please see the history of present illness.    All other systems reviewed and are negative.  EKGs/Labs/Other   Studies Reviewed:    The following studies were reviewed today:   Aortic valve calcium score is abnormal1377. Coronary calcium score 0, normal thoracic aorta Recent Labs: 02/16/2017: Hemoglobin 12.0; Platelets 210  Recent Lipid Panel No results found for: CHOL, TRIG, HDL, CHOLHDL, VLDL, LDLCALC, LDLDIRECT  Physical Exam:    VS:  BP 114/80 (BP Location: Right Arm, Patient Position: Sitting, Cuff Size: Normal)   Pulse 79   Ht 5' 4" (1.626 m)   Wt 175 lb (79.4 kg)   SpO2 97%   BMI 30.04 kg/m     Wt Readings from Last 3 Encounters:  07/06/17 175 lb (79.4 kg)  06/21/17 175 lb 6.4 oz (79.6 kg)  06/10/16 179 lb (81.2 kg)     GEN: Anxious Well nourished, well developed in no acute distress HEENT: Normal NECK: No JVD; No carotid bruits LYMPHATICS: No lymphadenopathy CARDIAC:  2-3/6 SEM mid peak aortic area to R CCA s2 single harsh no AR RRR confirmed on exam today RRR, no murmurs, rubs, gallops RESPIRATORY:  Clear to auscultation without rales, wheezing or rhonchi  ABDOMEN: Soft, non-tender, non-distended MUSCULOSKELETAL:  No edema; No deformity  SKIN: Warm and dry NEUROLOGIC:  Alert and oriented x 3 PSYCHIATRIC:  Normal affect    Signed, Karima Carrell, MD  07/06/2017 11:57 AM    [facility]  

## 2017-07-06 ENCOUNTER — Ambulatory Visit: Payer: BC Managed Care – PPO | Admitting: Cardiology

## 2017-07-06 ENCOUNTER — Ambulatory Visit (HOSPITAL_BASED_OUTPATIENT_CLINIC_OR_DEPARTMENT_OTHER)
Admission: RE | Admit: 2017-07-06 | Discharge: 2017-07-06 | Disposition: A | Discharge status: Home / Self Care | Payer: BC Managed Care – PPO | Source: Ambulatory Visit | Attending: Cardiology | Admitting: Cardiology

## 2017-07-06 ENCOUNTER — Encounter: Payer: Self-pay | Admitting: Cardiology

## 2017-07-06 VITALS — BP 114/80 | HR 79 | Ht 64.0 in | Wt 175.0 lb

## 2017-07-06 DIAGNOSIS — Z01818 Encounter for other preprocedural examination: Secondary | ICD-10-CM | POA: Insufficient documentation

## 2017-07-06 DIAGNOSIS — I350 Nonrheumatic aortic (valve) stenosis: Secondary | ICD-10-CM | POA: Diagnosis not present

## 2017-07-06 DIAGNOSIS — I10 Essential (primary) hypertension: Secondary | ICD-10-CM

## 2017-07-06 DIAGNOSIS — G4733 Obstructive sleep apnea (adult) (pediatric): Secondary | ICD-10-CM | POA: Diagnosis not present

## 2017-07-06 NOTE — Patient Instructions (Addendum)
Medication Instructions:  Your physician recommends that you continue on your current medications as directed. Please refer to the Current Medication list given to you today.  Labwork: Your physician recommends that you have the following labs drawn: CBC, BMP  Testing/Procedures: A chest x-ray takes a picture of the organs and structures inside the chest, including the heart, lungs, and blood vessels. This test can show several things, including, whether the heart is enlarges; whether fluid is building up in the lungs; and whether pacemaker / defibrillator leads are still in place.  Your physician has requested that you have a cardiac catheterization. Cardiac catheterization is used to diagnose and/or treat various heart conditions. Doctors may recommend this procedure for a number of different reasons. The most common reason is to evaluate chest pain. Chest pain can be a symptom of coronary artery disease (CAD), and cardiac catheterization can show whether plaque is narrowing or blocking your heart's arteries. This procedure is also used to evaluate the valves, as well as measure the blood flow and oxygen levels in different parts of your heart. For further information please visit [email or web address] Please follow instruction sheet, as given.    [facility]OVASC[facility] [place] [address] [place] [address] Dept: [phone] Loc: [phone]  Miranda Berger  07/06/2017  You are scheduled for a Cardiac Catheterization on Friday, May 17 with Dr. Tonny Bollman.  1. Please arrive at the [facility] (Main Entrance A) at [facility]: [address] [place], [place] [address] at 8:00 AM (two hours before your procedure to ensure your preparation). Free valet parking service is available.   Special note: Every effort is made to have your procedure done on time. Please understand that emergencies sometimes delay  scheduled procedures.  2. Diet: Do not eat or drink anything after midnight prior to your procedure except sips of water to take medications.  3. Labs: None needed. We are checking today.  4. Medication instructions in preparation for your procedure:  On the morning of your procedure, take your Aspirin and any morning medicines NOT listed above.  You may use sips of water.  5. Plan for one night stay--bring personal belongings. 6. Bring a current list of your medications and current insurance cards. 7. You MUST have a responsible person to drive you home. 8. Someone MUST be with you the first 24 hours after you arrive home or your discharge will be delayed. 9. Please wear clothes that are easy to get on and off and wear slip-on shoes.  Thank you for allowing [place] to care for you!   -- [facility]  Follow-Up: Your physician recommends that you schedule a follow-up appointment in: 4 weeks.  Any Other Special Instructions Will Be Listed Below (If Applicable).     If you need a refill on your cardiac medications before your next appointment, please call your pharmacy.

## 2017-07-07 ENCOUNTER — Telehealth: Payer: Self-pay | Admitting: *Deleted

## 2017-07-07 LAB — BASIC METABOLIC PANEL
BUN / CREAT RATIO: 12 (ref 12–28)
BUN: 12 mg/dL (ref 8–27)
CO2: 25 mmol/L (ref 20–29)
Calcium: 9.9 mg/dL (ref 8.7–10.3)
Chloride: 103 mmol/L (ref 96–106)
Creatinine, Ser: 0.97 mg/dL (ref 0.57–1.00)
GFR calc Af Amer: 73 mL/min/{1.73_m2} (ref >59)
GFR, EST NON AFRICAN AMERICAN: 63 mL/min/{1.73_m2} (ref >59)
Glucose: 87 mg/dL (ref 65–99)
Potassium: 5.1 mmol/L (ref 3.5–5.2)
SODIUM: 141 mmol/L (ref 134–144)

## 2017-07-07 LAB — CBC
HEMATOCRIT: 37.5 % (ref 34.0–46.6)
HEMOGLOBIN: 12.8 g/dL (ref 11.1–15.9)
MCH: 30.3 pg (ref 26.6–33.0)
MCHC: 34.1 g/dL (ref 31.5–35.7)
MCV: 89 fL (ref 79–97)
Platelets: 256 10*3/uL (ref 150–379)
RBC: 4.22 x10E6/uL (ref 3.77–5.28)
RDW: 13.3 % (ref 12.3–15.4)
WBC: 6 10*3/uL (ref 3.4–10.8)

## 2017-07-07 NOTE — Telephone Encounter (Signed)
Pt contacted pre-catheterization scheduled at [facility] for: Friday Jul 08, 2017 10:30 AM Verified arrival time and place: [facility] Main Entrance A at: 8 AM  No solid food after midnight prior to cath, clear liquids until 5 AM day of procedure. Verified allergies in Epic. Verified no diabetes medications.  AM meds can be  taken pre-cath with sip of water including: ASA 81 mg  Confirmed patient has responsible person to drive home post procedure and observe patient for 24 hours: yes

## 2017-07-08 ENCOUNTER — Ambulatory Visit (HOSPITAL_COMMUNITY)
Admission: RE | Admit: 2017-07-08 | Discharge: 2017-07-08 | Disposition: A | Discharge status: Home / Self Care | Payer: BC Managed Care – PPO | Source: Ambulatory Visit | Attending: Cardiovascular Disease | Admitting: Cardiovascular Disease

## 2017-07-08 ENCOUNTER — Ambulatory Visit (HOSPITAL_COMMUNITY)
Admission: RE | Disposition: A | Discharge status: Home / Self Care | Payer: Self-pay | Source: Ambulatory Visit | Attending: Cardiovascular Disease

## 2017-07-08 ENCOUNTER — Encounter (HOSPITAL_COMMUNITY): Payer: Self-pay | Admitting: Cardiovascular Disease

## 2017-07-08 ENCOUNTER — Telehealth: Payer: Self-pay | Admitting: Cardiology

## 2017-07-08 DIAGNOSIS — I10 Essential (primary) hypertension: Secondary | ICD-10-CM | POA: Insufficient documentation

## 2017-07-08 DIAGNOSIS — Z9889 Other specified postprocedural states: Secondary | ICD-10-CM | POA: Diagnosis not present

## 2017-07-08 DIAGNOSIS — Z9049 Acquired absence of other specified parts of digestive tract: Secondary | ICD-10-CM | POA: Insufficient documentation

## 2017-07-08 DIAGNOSIS — Z7951 Long term (current) use of inhaled steroids: Secondary | ICD-10-CM | POA: Insufficient documentation

## 2017-07-08 DIAGNOSIS — Z888 Allergy status to other drugs, medicaments and biological substances status: Secondary | ICD-10-CM | POA: Diagnosis not present

## 2017-07-08 DIAGNOSIS — Z885 Allergy status to narcotic agent status: Secondary | ICD-10-CM | POA: Insufficient documentation

## 2017-07-08 DIAGNOSIS — G4733 Obstructive sleep apnea (adult) (pediatric): Secondary | ICD-10-CM | POA: Diagnosis not present

## 2017-07-08 DIAGNOSIS — Z823 Family history of stroke: Secondary | ICD-10-CM | POA: Diagnosis not present

## 2017-07-08 DIAGNOSIS — F329 Major depressive disorder, single episode, unspecified: Secondary | ICD-10-CM | POA: Insufficient documentation

## 2017-07-08 DIAGNOSIS — J45909 Unspecified asthma, uncomplicated: Secondary | ICD-10-CM | POA: Diagnosis not present

## 2017-07-08 DIAGNOSIS — I350 Nonrheumatic aortic (valve) stenosis: Secondary | ICD-10-CM | POA: Insufficient documentation

## 2017-07-08 DIAGNOSIS — R0789 Other chest pain: Secondary | ICD-10-CM | POA: Diagnosis not present

## 2017-07-08 DIAGNOSIS — Z8249 Family history of ischemic heart disease and other diseases of the circulatory system: Secondary | ICD-10-CM | POA: Diagnosis not present

## 2017-07-08 DIAGNOSIS — Z79899 Other long term (current) drug therapy: Secondary | ICD-10-CM | POA: Insufficient documentation

## 2017-07-08 DIAGNOSIS — F419 Anxiety disorder, unspecified: Secondary | ICD-10-CM | POA: Diagnosis not present

## 2017-07-08 DIAGNOSIS — Z90710 Acquired absence of both cervix and uterus: Secondary | ICD-10-CM | POA: Insufficient documentation

## 2017-07-08 HISTORY — PX: RIGHT/LEFT HEART CATH AND CORONARY ANGIOGRAPHY: CATH118266

## 2017-07-08 LAB — POCT I-STAT 3, ART BLOOD GAS (G3+)
BICARBONATE: 26.1 mmol/L (ref 20.0–28.0)
O2 SAT: 99 %
PO2 ART: 123 mmHg — AB (ref 83.0–108.0)
TCO2: 28 mmol/L (ref 22–32)
pCO2 arterial: 47.7 mmHg (ref 32.0–48.0)
pH, Arterial: 7.347 — ABNORMAL LOW (ref 7.350–7.450)

## 2017-07-08 LAB — POCT I-STAT 3, VENOUS BLOOD GAS (G3P V)
Acid-base deficit: 1 mmol/L (ref 0.0–2.0)
Bicarbonate: 25.6 mmol/L (ref 20.0–28.0)
O2 SAT: 76 %
TCO2: 27 mmol/L (ref 22–32)
pCO2, Ven: 49 mmHg (ref 44.0–60.0)
pH, Ven: 7.326 (ref 7.250–7.430)
pO2, Ven: 45 mmHg (ref 32.0–45.0)

## 2017-07-08 SURGERY — RIGHT/LEFT HEART CATH AND CORONARY ANGIOGRAPHY
Anesthesia: LOCAL

## 2017-07-08 MED ORDER — IOHEXOL 350 MG/ML SOLN
INTRAVENOUS | Status: DC | PRN
  Administered 2017-07-08: 40 mL

## 2017-07-08 MED ORDER — HEPARIN (PORCINE) IN NACL 1000-0.9 UT/500ML-% IV SOLN
INTRAVENOUS | Status: AC
  Filled 2017-07-08: qty 1000

## 2017-07-08 MED ORDER — VERAPAMIL HCL 2.5 MG/ML IV SOLN
INTRAVENOUS | Status: DC | PRN
  Administered 2017-07-08: 10 mL via INTRA_ARTERIAL

## 2017-07-08 MED ORDER — FENTANYL CITRATE (PF) 100 MCG/2ML IJ SOLN
INTRAMUSCULAR | Status: DC | PRN
  Administered 2017-07-08: 25 ug via INTRAVENOUS

## 2017-07-08 MED ORDER — MIDAZOLAM HCL 2 MG/2ML IJ SOLN
INTRAMUSCULAR | Status: DC | PRN
  Administered 2017-07-08: 1 mg via INTRAVENOUS
  Administered 2017-07-08: 1.5 mg via INTRAVENOUS

## 2017-07-08 MED ORDER — MIDAZOLAM HCL 2 MG/2ML IJ SOLN
INTRAMUSCULAR | Status: AC
  Filled 2017-07-08: qty 2

## 2017-07-08 MED ORDER — LIDOCAINE HCL (PF) 1 % IJ SOLN
INTRAMUSCULAR | Status: DC | PRN
  Administered 2017-07-08 (×2): 2 mL

## 2017-07-08 MED ORDER — ACETAMINOPHEN 325 MG PO TABS
650.0000 mg | ORAL_TABLET | ORAL | Status: DC | PRN
  Administered 2017-07-08: 650 mg via ORAL

## 2017-07-08 MED ORDER — ACETAMINOPHEN 325 MG PO TABS
ORAL_TABLET | ORAL | Status: AC
  Administered 2017-07-08: 650 mg via ORAL
  Filled 2017-07-08: qty 2

## 2017-07-08 MED ORDER — FENTANYL CITRATE (PF) 100 MCG/2ML IJ SOLN
INTRAMUSCULAR | Status: AC
  Filled 2017-07-08: qty 2

## 2017-07-08 MED ORDER — HEPARIN (PORCINE) IN NACL 2-0.9 UNITS/ML
INTRAMUSCULAR | Status: AC | PRN
  Administered 2017-07-08 (×2): 500 mL

## 2017-07-08 MED ORDER — HEPARIN SODIUM (PORCINE) 1000 UNIT/ML IJ SOLN
INTRAMUSCULAR | Status: DC | PRN
  Administered 2017-07-08: 4000 [IU] via INTRAVENOUS

## 2017-07-08 MED ORDER — SODIUM CHLORIDE 0.9 % WEIGHT BASED INFUSION: 1.0000 mL/kg/h | INTRAVENOUS | Status: DC

## 2017-07-08 MED ORDER — LIDOCAINE HCL (PF) 1 % IJ SOLN
INTRAMUSCULAR | Status: AC
  Filled 2017-07-08: qty 30

## 2017-07-08 MED ORDER — VERAPAMIL HCL 2.5 MG/ML IV SOLN
INTRAVENOUS | Status: AC
  Filled 2017-07-08: qty 2

## 2017-07-08 MED ORDER — ASPIRIN 81 MG PO CHEW: 81.0000 mg | CHEWABLE_TABLET | ORAL | Status: DC

## 2017-07-08 MED ORDER — SODIUM CHLORIDE 0.9 % WEIGHT BASED INFUSION
3.0000 mL/kg/h | INTRAVENOUS | Status: AC
  Administered 2017-07-08: 3 mL/kg/h via INTRAVENOUS

## 2017-07-08 MED ORDER — SODIUM CHLORIDE 0.9% FLUSH: 3.0000 mL | Freq: Two times a day (BID) | INTRAVENOUS | Status: DC

## 2017-07-08 MED ORDER — SODIUM CHLORIDE 0.9% FLUSH: 3.0000 mL | INTRAVENOUS | Status: DC | PRN

## 2017-07-08 MED ORDER — SODIUM CHLORIDE 0.9 % IV SOLN: 250.0000 mL | INTRAVENOUS | Status: DC | PRN

## 2017-07-08 MED ORDER — ONDANSETRON HCL 4 MG/2ML IJ SOLN: 4.0000 mg | Freq: Four times a day (QID) | INTRAMUSCULAR | Status: DC | PRN

## 2017-07-08 MED ORDER — HEPARIN SODIUM (PORCINE) 1000 UNIT/ML IJ SOLN
INTRAMUSCULAR | Status: AC
  Filled 2017-07-08: qty 1

## 2017-07-08 SURGICAL SUPPLY — 20 items
CATH BALLN WEDGE 5F 110CM (CATHETERS) ×1 IMPLANT
CATH INFINITI 5 FR JL3.5 (CATHETERS) ×2 IMPLANT
CATH INFINITI 5FR AL1 (CATHETERS) ×1 IMPLANT
CATH INFINITI JR4 5F (CATHETERS) ×1 IMPLANT
COVER PRB 48X5XTLSCP FOLD TPE (BAG) IMPLANT
COVER PROBE 5X48 (BAG) ×2
DEVICE RAD COMP TR BAND LRG (VASCULAR PRODUCTS) ×1 IMPLANT
GUIDEWIRE .025 260CM (WIRE) ×1 IMPLANT
GUIDEWIRE INQWIRE 1.5J.035X260 (WIRE) IMPLANT
INQWIRE 1.5J .035X260CM (WIRE) ×2
KIT HEART LEFT (KITS) ×2 IMPLANT
NDL PERC 21GX4CM (NEEDLE) IMPLANT
NEEDLE PERC 21GX4CM (NEEDLE) ×2 IMPLANT
PACK CARDIAC CATHETERIZATION (CUSTOM PROCEDURE TRAY) ×2 IMPLANT
SHEATH RAIN 4/5FR (SHEATH) ×1 IMPLANT
SHEATH RAIN RADIAL 21G 6FR (SHEATH) ×1 IMPLANT
TRANSDUCER W/STOPCOCK (MISCELLANEOUS) ×2 IMPLANT
TUBING CIL FLEX 10 FLL-RA (TUBING) ×2 IMPLANT
WIRE EMERALD ST .035X150CM (WIRE) ×1 IMPLANT
WIRE HI TORQ VERSACORE-J 145CM (WIRE) ×1 IMPLANT

## 2017-07-08 NOTE — Telephone Encounter (Signed)
Patients husband [name] called stating that patient had LHC and RHC today, and she is scheduled to see Dr Barry Dienes on 07-19-17.  Her husband would like to know if Dr Dulce Sellar thinks she should be seen sooner, and if she should could we expedite the appointment.

## 2017-07-08 NOTE — Telephone Encounter (Signed)
No, she is OK and he is available and I would stay with him

## 2017-07-08 NOTE — Progress Notes (Signed)
Rt AC brachial sheath removed, tip intact.  Pressure held for 10 minutes. Site unremarkable will continue to monitor

## 2017-07-08 NOTE — Telephone Encounter (Signed)
Patient has a question

## 2017-07-08 NOTE — Telephone Encounter (Signed)
Advised no need to follow-up with Dr. Dulce Sellar at this time. Patient concerned and wanted the surgery done next week. Advised that patient could contact cardiothoracic surgery's office and discuss with them. Advised that if this was urgent, they would have been told and the process would have been expedited. Advised that since she was scheduled for 5/28 with Dr. Cornelius Moras, they believe her to be stable enough to wait until then for consultation. Advised could contact their office and asked to be put on a waiting list for a sooner appointment. Verbalized understanding, no further questions.

## 2017-07-08 NOTE — Interval H&P Note (Signed)
History and Physical Interval Note:  07/08/2017 9:55 AM  Miranda Berger  has presented today for surgery, with the diagnosis of aortic stenosis  The various methods of treatment have been discussed with the patient and family. After consideration of risks, benefits and other options for treatment, the patient has consented to  Procedure(s): RIGHT/LEFT HEART CATH AND CORONARY ANGIOGRAPHY (N/A) as a surgical intervention .  The patient's history has been reviewed, patient examined, no change in status, stable for surgery.  I have reviewed the patient's chart and labs.  Questions were answered to the patient's satisfaction.     Tonny Bollman

## 2017-07-08 NOTE — Discharge Instructions (Signed)

## 2017-07-10 ENCOUNTER — Telehealth: Payer: Self-pay | Admitting: Internal Medicine

## 2017-07-10 NOTE — Telephone Encounter (Signed)
Received page from patient. States she has had episodes of "heart racing" today. Never had anything like this before. Happened twice today. No syncope, presyncope, chest pain, chest pressure, dyspnea during these episodes. Feeling dull pain in her bilateral chest now, similar to her prior episodes of anxiety. Thinks these episodes of palpitations are from anxiety as well. Not too worried about this episode but wanted to call to make sure everything is ok.  Had R/L heart cath done Friday. Awaiting call back to schedule surgery.   Offered reassurance to patient. Instructed her to call me back or come to ED if episodes worsen in frequency or severity.  Rosario Jacks, MD Cardiology Fellow, PGY-5

## 2017-07-11 MED FILL — Heparin Sod (Porcine)-NaCl IV Soln 1000 Unit/500ML-0.9%: INTRAVENOUS | Qty: 1000 | Status: AC

## 2017-07-12 ENCOUNTER — Encounter: Payer: BC Managed Care – PPO | Admitting: Thoracic Surgery (Cardiothoracic Vascular Surgery)

## 2017-07-14 ENCOUNTER — Institutional Professional Consult (permissible substitution): Payer: BC Managed Care – PPO | Admitting: Thoracic Surgery (Cardiothoracic Vascular Surgery)

## 2017-07-14 ENCOUNTER — Other Ambulatory Visit: Payer: Self-pay

## 2017-07-14 ENCOUNTER — Encounter: Payer: Self-pay | Admitting: Thoracic Surgery (Cardiothoracic Vascular Surgery)

## 2017-07-14 VITALS — BP 110/73 | HR 81 | Resp 18 | Ht 64.0 in | Wt 175.0 lb

## 2017-07-14 DIAGNOSIS — I350 Nonrheumatic aortic (valve) stenosis: Secondary | ICD-10-CM

## 2017-07-14 NOTE — Patient Instructions (Signed)
Continue all previous medications without any changes at this time  

## 2017-07-14 NOTE — Progress Notes (Signed)
[facility]  MULTIDISCIPLINARY HEART VALVE CLINIC  CARDIOTHORACIC SURGERY CONSULTATION REPORT  Referring Provider is Baldo Daub, MD PCP is Shirlean Mylar, MD  Chief Complaint  Patient presents with  . Aortic Stenosis    new patient evaluation, CATH 07/08/2017    HPI:  Patient is a 62 year old female with history of anxiety and hypertension who has been referred for surgical consultation to discuss treatment options for management of recently discovered bicuspid aortic valve with aortic stenosis.  Patient states that she has been fairly healthy all of her life.  She recently underwent routine physical examination by her gynecologist and was noted to have a systolic murmur on physical exam.  An echocardiogram was performed June 14, 2017 demonstrating the presence of a functionally bicuspid aortic valve with moderate to severe aortic stenosis.  Patient was referred for cardiology consultation and evaluated by Dr. Dulce Sellar on June 21, 2017 who noted that the echocardiogram Doppler parameters were consistent with moderate aortic stenosis but the patient had low stroke volume index and significant symptoms of atypical chest pain with exertional shortness of breath.  Paradoxical low flow normal ejection fraction severe aortic stenosis was suspected and a CT coronary artery valve calcium score was ordered.  CT confirmed the presence of heavy aortic valve calcification and the patient underwent diagnostic cardiac catheterization by Dr. Excell Seltzer on Jul 08, 2017.  The patient was found to have normal coronary arteries with no significant coronary artery disease.  Mean transvalvular gradient across aortic valve was measured 20 mmHg at catheterization, corresponding to aortic valve area calculated 1.25 cm.  Right-sided pressures were normal.  The patient was referred for elective surgical consultation.  The patient is married and lives with her husband in [place].  The patient is a  retired Chart[employer] loss adjuster.  She has remained reasonably active physically although she has had problems with arthritis in both knees which limits her physical activity to some degree.  She describes a gradual progression over the last several months of decreased energy, increased fatigue, and exertional shortness of breath.  She has had some chest pain or chest tightness intermittently which does not seem to be related to physical activity.  She has had some dizzy spells and palpitations.  She denies any exertional chest pain.  She denies any PND, orthopnea, or lower extremity edema.  She has not had syncope.  Past Medical History:  Diagnosis Date  . Anxiety   . Asthma   . Depression   . Hypertension     Past Surgical History:  Procedure Laterality Date  . ABDOMINAL HYSTERECTOMY    . BLADDER SURGERY  2011  . CARPAL TUNNEL RELEASE  2012  . CHOLECYSTECTOMY    . RIGHT/LEFT HEART CATH AND CORONARY ANGIOGRAPHY N/A 07/08/2017   Procedure: RIGHT/LEFT HEART CATH AND CORONARY ANGIOGRAPHY;  Surgeon: Tonny Bollman, MD;  Location: [facility] INVASIVE CV LAB;  Service: Cardiovascular;  Laterality: N/A;  . TONSILLECTOMY      Family History  Problem Relation Age of Onset  . Stroke Mother   . Leukemia Mother   . Stroke Father   . Sleep apnea Father   . Valvular heart disease Brother   . Heart Problems Maternal Aunt   . Valvular heart disease Paternal Grandfather   . Valvular heart disease Maternal Aunt     Social History   Socioeconomic History  . Marital status: Married    Spouse name: Not on file  . Number of children: 1  . Years of  education: BS  . Highest education level: Not on file  Occupational History  . Not on file  Social Needs  . Financial resource strain: Not on file  . Food insecurity:    Worry: Not on file    Inability: Not on file  . Transportation needs:    Medical: Not on file    Non-medical: Not on file  Tobacco Use  . Smoking status: Never Smoker  . Smokeless tobacco:  Never Used  Substance and Sexual Activity  . Alcohol use: No    Alcohol/week: 0.0 oz  . Drug use: No  . Sexual activity: Not on file  Lifestyle  . Physical activity:    Days per week: Not on file    Minutes per session: Not on file  . Stress: Not on file  Relationships  . Social connections:    Talks on phone: Not on file    Gets together: Not on file    Attends religious service: Not on file    Active member of club or organization: Not on file    Attends meetings of clubs or organizations: Not on file    Relationship status: Not on file  . Intimate partner violence:    Fear of current or ex partner: Not on file    Emotionally abused: Not on file    Physically abused: Not on file    Forced sexual activity: Not on file  Other Topics Concern  . Not on file  Social History Narrative   Occasionally consumes tea or coffee    Current Outpatient Medications  Medication Sig Dispense Refill  . ALPRAZolam (XANAX) 0.25 MG tablet Take 0.25 mg by mouth 2 (two) times daily as needed for anxiety.     [name] amoxicillin (AMOXIL) 500 MG capsule Take 2,000 mg (4 tablets) 45 minutes prior to dental procedures. 12 capsule 2  . atenolol (TENORMIN) 50 MG tablet Take 50 mg by mouth daily.    [name] buPROPion (WELLBUTRIN XL) 150 MG 24 hr tablet Take 150 mg by mouth daily.    . calcium carbonate (OS-CAL) 600 MG TABS tablet Take 600 mg by mouth 2 (two) times daily with a meal.    . fluticasone (FLONASE) 50 MCG/ACT nasal spray Place 2 sprays into the nose daily.    . Fluticasone-Salmeterol (ADVAIR) 250-50 MCG/DOSE AEPB Inhale 1 puff into the lungs 2 (two) times daily.    . mirtazapine (REMERON) 45 MG tablet Take 45 mg by mouth at bedtime.    . Multiple Vitamin (MULTIVITAMIN) capsule Take 1 capsule by mouth daily.    [name] omeprazole (PRILOSEC) 20 MG capsule Take 20 mg by mouth daily.    . traMADol (ULTRAM) 50 MG tablet Take 1 tablet (50 mg total) by mouth every 8 (eight) hours as needed. 30 tablet 0   No current  facility-administered medications for this visit.     Allergies  Allergen Reactions  . Codeine Itching  . Prednisone Itching      Review of Systems:   General:  normal appetite, decreased energy, no weight gain, no weight loss, no fever  Cardiac:  + chest pain with exertion, + chest pain at rest, + SOB with exertion, no resting SOB, no PND, no orthopnea, no palpitations, no arrhythmia, no atrial fibrillation, no LE edema, + dizzy spells, no syncope  Respiratory:  + shortness of breath, no home oxygen, no productive cough, no dry cough, no bronchitis, no wheezing, no hemoptysis, no asthma, no pain with inspiration or cough, +  sleep apnea, + CPAP at night  GI:   no difficulty swallowing, no reflux, + frequent heartburn, no hiatal hernia, no abdominal pain, no constipation, no diarrhea, no hematochezia, no hematemesis, no melena  GU:   no dysuria,  no frequency, no urinary tract infection, no hematuria, no kidney stones, no kidney disease  Vascular:  no pain suggestive of claudication, no pain in feet, no leg cramps, no varicose veins, no DVT, no non-healing foot ulcer  Neuro:   no stroke, no TIA's, no seizures, no headaches, no temporary blindness one eye,  no slurred speech, no peripheral neuropathy, no chronic pain, no instability of gait, no memory/cognitive dysfunction  Musculoskeletal: + arthritis, + joint swelling, no myalgias, mild difficulty walking, normal mobility   Skin:   no rash, no itching, no skin infections, no pressure sores or ulcerations  Psych:   + anxiety, + depression, + nervousness, no unusual recent stress  Eyes:   no blurry vision, no floaters, no recent vision changes, + wears glasses or contacts  ENT:   no hearing loss, no loose or painful teeth, no dentures, last saw dentist 07/10/2017  Hematologic:  + easy bruising, no abnormal bleeding, no clotting disorder, no frequent epistaxis  Endocrine:  no diabetes, does not check CBG's at home           Physical  Exam:   BP 110/73 (BP Location: Right Arm, Patient Position: Sitting, Cuff Size: Normal)   Pulse 81   Resp 18   Ht 5\' 4"  (1.626 m)   Wt 175 lb (79.4 kg)   SpO2 98% Comment: RA  BMI 30.04 kg/m   General:  Mildly obese,  well-appearing  HEENT:  Unremarkable   Neck:   no JVD, no bruits, no adenopathy   Chest:   clear to auscultation, symmetrical breath sounds, no wheezes, no rhonchi   CV:   RRR, grade III/VI crescendo/decrescendo murmur heard best at RSB,  no diastolic murmur  Abdomen:  soft, non-tender, no masses   Extremities:  warm, well-perfused, pulses diminished but palpable, no LE edema  Rectal/GU  Deferred  Neuro:   Grossly non-focal and symmetrical throughout  Skin:   Clean and dry, no rashes, no breakdown   Diagnostic Tests:  Transthoracic Echocardiography  Patient:    Miranda Berger, Miranda Berger MR #:       [id number] Study Date: 06/14/2017 Gender:     F Age:        66 Height:     160 cm Weight:     81.2 kg BSA:        1.93 m^2 Pt. Status: Room:   ATTENDING    Elane Fritz  REFERRING    Ernest Mallick [id number]  PERFORMING   [facility], Outpatient  SONOGRAPHER  Thurman Coyer  cc:  ------------------------------------------------------------------- LV EF: 55% -   60%  ------------------------------------------------------------------- Indications:      Murmur 785.2.  ------------------------------------------------------------------- History:   Risk factors:  Hypertension.  ------------------------------------------------------------------- Study Conclusions  - Left ventricle: The cavity size was normal. Wall thickness was   increased in a pattern of mild LVH. Systolic function was normal.   The estimated ejection fraction was in the range of 55% to 60%.   Wall motion was normal; there were no regional wall motion   abnormalities. Findings consistent with left ventricular   diastolic dysfunction, grade  indeterminate. - Aortic valve: Moderately calcified annulus. Trileaflet;  moderately calcified leaflets. There was moderate to severe   stenosis. Peak velocity (S): 339 cm/s. Mean gradient (S): 27 mm   Hg. Valve area (VTI): 0.82 cm^2. Valve area (Vmax): 0.76 cm^2.   Valve area (Vmean): 0.75 cm^2. - Atrial septum: No defect or patent foramen ovale was identified.  ------------------------------------------------------------------- Study data:  No prior study was available for comparison.  Study status:  Routine.  Procedure:  The patient reported no pain pre or post test. Transthoracic echocardiography. Image quality was adequate.  Study completion:  There were no complications. Transthoracic echocardiography.  M-mode, complete 2D, spectral Doppler, and color Doppler.  Birthdate:  Patient birthdate: 12-27-1955.  Age:  Patient is 62 yr old.  Sex:  Gender: female. BMI: 31.7 kg/m^2.  Blood pressure:     105/72  Patient status: Outpatient.  Study date:  Study date: 06/14/2017. Study time: 10:11 AM.  Location:  Echo laboratory.  -------------------------------------------------------------------  ------------------------------------------------------------------- Left ventricle:  The cavity size was normal. Wall thickness was increased in a pattern of mild LVH. Systolic function was normal. The estimated ejection fraction was in the range of 55% to 60%. Wall motion was normal; there were no regional wall motion abnormalities. Findings consistent with left ventricular diastolic dysfunction, grade indeterminate. There was no evidence of elevated ventricular filling pressure by Doppler parameters.  ------------------------------------------------------------------- Aortic valve:   Moderately calcified annulus. Trileaflet; moderately calcified leaflets.  Doppler:   There was moderate to severe stenosis.   There was no regurgitation.    VTI ratio of LVOT to aortic valve: 0.24. Valve area  (VTI): 0.82 cm^2. Indexed valve area (VTI): 0.42 cm^2/m^2. Peak velocity ratio of LVOT to aortic valve: 0.22. Valve area (Vmax): 0.76 cm^2. Indexed valve area (Vmax): 0.39 cm^2/m^2. Mean velocity ratio of LVOT to aortic valve: 0.22. Valve area (Vmean): 0.75 cm^2. Indexed valve area (Vmean): 0.39 cm^2/m^2.    Mean gradient (S): 27 mm Hg. Peak gradient (S): 46 mm Hg.  ------------------------------------------------------------------- Aorta:  Aortic root: The aortic root was normal in size.  ------------------------------------------------------------------- Mitral valve:   Normal thickness leaflets .  Doppler:  There was trivial regurgitation.    Peak gradient (D): 2 mm Hg.  ------------------------------------------------------------------- Left atrium:  The atrium was normal in size.  ------------------------------------------------------------------- Atrial septum:  No defect or patent foramen ovale was identified.   ------------------------------------------------------------------- Right ventricle:  The cavity size was normal. Wall thickness was normal. Systolic function was normal.  ------------------------------------------------------------------- Pulmonic valve:    The valve appears to be grossly normal. Doppler:  There was no significant regurgitation.  ------------------------------------------------------------------- Tricuspid valve:   Structurally normal valve.   Leaflet separation was normal.  Doppler:  Transvalvular velocity was within the normal range. There was no significant regurgitation.  ------------------------------------------------------------------- Right atrium:  The atrium was normal in size.  ------------------------------------------------------------------- Pericardium:  There was no pericardial effusion.  ------------------------------------------------------------------- Systemic veins: Inferior vena cava: The vessel was normal in  size. The respirophasic diameter changes were in the normal range (>= 50%), consistent with normal central venous pressure.  ------------------------------------------------------------------- Measurements   Left ventricle                           Value          Reference  LV ID, ED, PLAX chordal          (L)     35.8  mm       43 - 52  LV ID, ES, PLAX chordal  26.7  mm       23 - 38  LV fx shortening, PLAX chordal   (L)     25    %        >=29  LV PW thickness, ED                      10.7  mm       ----------  IVS/LV PW ratio, ED                      1.09           <=1.3  Stroke volume, 2D                        69    ml       ----------  Stroke volume/bsa, 2D                    36    ml/m^2   ----------  LV ejection fraction, 1-p A4C            59    %        ----------  LV end-diastolic volume, 2-p             82    ml       ----------  LV end-systolic volume, 2-p              36    ml       ----------  LV ejection fraction, 2-p                57    %        ----------  Stroke volume, 2-p                       47    ml       ----------  LV end-diastolic volume/bsa, 2-p         43    ml/m^2   ----------  LV end-systolic volume/bsa, 2-p          18    ml/m^2   ----------  Stroke volume/bsa, 2-p                   24.1  ml/m^2   ----------  LV e&', lateral                           9.14  cm/s     ----------  LV E/e&', lateral                         8.44           ----------  LV e&', medial                            7.29  cm/s     ----------  LV E/e&', medial                          10.58          ----------  LV e&', average  8.22  cm/s     ----------  LV E/e&', average                         9.39           ----------    Ventricular septum                       Value          Reference  IVS thickness, ED                        11.7  mm       ----------    LVOT                                     Value          Reference  LVOT ID, S                                21    mm       ----------  LVOT area                                3.46  cm^2     ----------  LVOT peak velocity, S                    74.2  cm/s     ----------  LVOT mean velocity, S                    52.8  cm/s     ----------  LVOT VTI, S                              20    cm       ----------  LVOT peak gradient, S                    2     mm Hg    ----------    Aortic valve                             Value          Reference  Aortic valve peak velocity, S            339   cm/s     ----------  Aortic valve mean velocity, S            244   cm/s     ----------  Aortic valve VTI, S                      84.4  cm       ----------  Aortic mean gradient, S                  27    mm Hg    ----------  Aortic peak gradient, S                  46    mm Hg    ----------  VTI ratio, LVOT/AV                       0.24           ----------  Aortic valve area, VTI                   0.82  cm^2     ----------  Aortic valve area/bsa, VTI               0.42  cm^2/m^2 ----------  Velocity ratio, peak, LVOT/AV            0.22           ----------  Aortic valve area, peak velocity         0.76  cm^2     ----------  Aortic valve area/bsa, peak              0.39  cm^2/m^2 ----------  velocity  Velocity ratio, mean, LVOT/AV            0.22           ----------  Aortic valve area, mean velocity         0.75  cm^2     ----------  Aortic valve area/bsa, mean              0.39  cm^2/m^2 ----------  velocity    Aorta                                    Value          Reference  Aortic root ID, ED                       32    mm       ----------    Left atrium                              Value          Reference  LA ID, A-P, ES                           33    mm       ----------  LA ID/bsa, A-P                           1.71  cm/m^2   <=2.2  LA volume, S                             58    ml       ----------  LA volume/bsa, S                         30    ml/m^2   ----------  LA volume,  ES, 1-p A4C                   53.5  ml       ----------  LA volume/bsa, ES, 1-p A4C               27.7  ml/m^2   ----------  LA volume, ES, 1-p A2C  58.9  ml       ----------  LA volume/bsa, ES, 1-p A2C               30.5  ml/m^2   ----------    Mitral valve                             Value          Reference  Mitral E-wave peak velocity              77.1  cm/s     ----------  Mitral A-wave peak velocity              67.8  cm/s     ----------  Mitral deceleration time                 165   ms       150 - 230  Mitral peak gradient, D                  2     mm Hg    ----------  Mitral E/A ratio, peak                   1.1            ----------    Right atrium                             Value          Reference  RA ID, S-I, ES, A4C                      41.4  mm       34 - 49  RA area, ES, A4C                         10.4  cm^2     8.3 - 19.5  RA volume, ES, A/L                       21.4  ml       ----------  RA volume/bsa, ES, A/L                   11.1  ml/m^2   ----------    Right ventricle                          Value          Reference  TAPSE                                    27.7  mm       ----------  RV s&', lateral, S                        10.8  cm/s     ----------  Legend: (L)  and  (H)  mark values outside specified reference range.  ------------------------------------------------------------------- Prepared and Electronically Authenticated by  Prentice Docker, MD 2019-04-23T11:41:44   RIGHT/LEFT HEART CATH AND CORONARY ANGIOGRAPHY  Conclusion   1. Angiographically normal coronary arteries (left dominant) 2. Moderate aortic stenosis based on hemodynamic criteria with  mean gradient 20 mmHg and AVA 1.25 square cm 3. Normal intracardiac filling pressures (right and left heart)  Data reviewed: Agree patient likely has severe paradoxical LFLG aortic stenosis with moderately increased transaortic gradients in the setting of low stroke volume (SVI  by echo 24) and heavy aortic calcification (aortic valve agaston score > 1275). Plans as outlined by Dr Dulce Sellar with pending cardiac surgical evaluation for AVR.  Indications   Severe aortic stenosis [I35.0 (ICD-10-CM)]  Procedural Details/Technique   Technical Details INDICATION: Symptomatic aortic stenosis  PROCEDURAL DETAILS: There was an indwelling IV in a right antecubital vein. Using normal sterile technique, the IV was changed out for a 5 Fr brachial sheath over a 0.018 inch wire. The right wrist was then prepped, draped, and anesthetized with 1% lidocaine. Using the modified Seldinger technique a 5/6 French Slender sheath was placed in the right radial artery. Intra-arterial verapamil was administered through the radial artery sheath. IV heparin was administered after a JR4 catheter was advanced into the central aorta. A Swan-Ganz catheter was used for the right heart catheterization. Standard protocol was followed for recording of right heart pressures and sampling of oxygen saturations. Fick cardiac output was calculated. Standard Judkins catheters were used for selective coronary angiography. The aortic valve could not be crossed with a J wire. The valve is crossed with a straight tip wire directed by an AL-1 catheter. There were no immediate procedural complications. The patient was transferred to the post catheterization recovery area for further monitoring.     Estimated blood loss <50 mL.  During this procedure the patient was administered the following to achieve and maintain moderate conscious sedation: Versed 2.5 mg, Fentanyl 25 mcg, while the patient's heart rate, blood pressure, and oxygen saturation were continuously monitored. The period of conscious sedation was 41 minutes, of which I was present face-to-face 100% of this time.  Coronary Findings   Diagnostic  Dominance: Left  Left Main  Vessel is angiographically normal.  Left Circumflex  Vessel is angiographically  normal.  First Obtuse Marginal Branch  Vessel is angiographically normal.  Right Coronary Artery  Vessel is angiographically normal.  Intervention   No interventions have been documented.  Left Heart   Aortic Valve There is moderate aortic valve stenosis. The aortic valve is calcified. There is restricted aortic valve motion. Restricted aortic valve leaflet motion with heavy calcification by plain fluoroscopy. Peak-to-peak gradient 25 mmHg, mean gradient 20 mmHg, calculated AVA 1.25 square cm  Coronary Diagrams   Diagnostic Diagram       Implants    No implant documentation for this case.  MERGE Images   Show images for CARDIAC CATHETERIZATION   Link to Procedure Log   Procedure Log    Hemo Data    Most Recent Value  Fick Cardiac Output 6.11 L/min  Fick Cardiac Output Index 3.32 (L/min)/BSA  Aortic Mean Gradient 19.5 mmHg  Aortic Peak Gradient 25 mmHg  Aortic Valve Area 1.25  Aortic Value Area Index 0.68 cm2/BSA  RA A Wave 7 mmHg  RA V Wave 4 mmHg  RA Mean 5 mmHg  RV Systolic Pressure 24 mmHg  RV Diastolic Pressure 2 mmHg  RV EDP 7 mmHg  PA Systolic Pressure 21 mmHg  PA Diastolic Pressure 8 mmHg  PA Mean 14 mmHg  PW A Wave 10 mmHg  PW V Wave 8 mmHg  PW Mean 8 mmHg  AO Systolic Pressure 102 mmHg  AO Diastolic Pressure 60 mmHg  AO Mean 78 mmHg  LV Systolic Pressure 131 mmHg  LV Diastolic Pressure 8 mmHg  LV EDP 13 mmHg  Arterial Occlusion Pressure Extended Systolic Pressure 103 mmHg  Arterial Occlusion Pressure Extended Diastolic Pressure 59 mmHg  Arterial Occlusion Pressure Extended Mean Pressure 79 mmHg  Left Ventricular Apex Extended Systolic Pressure 128 mmHg  Left Ventricular Apex Extended Diastolic Pressure 7 mmHg  Left Ventricular Apex Extended EDP Pressure 12 mmHg  QP/QS 1  TPVR Index 4.21 HRUI  TSVR Index 23.49 HRUI  PVR SVR Ratio 0.08  TPVR/TSVR Ratio 0.18     Impression:  I have personally reviewed the patient's recent transthoracic  echocardiogram and diagnostic cardiac catheterization.  She describes gradual progression of symptoms of worsening fatigue and increased shortness of breath consistent with chronic diastolic congestive heart failure, [place] Heart Association functional class II.  She has also been having some intermittent episodes of substernal chest discomfort which could be angina, although symptoms are somewhat atypical and could be related to anxiety.  The patient has at least moderate aortic stenosis bordering on severe, and probably has stage D symptomatic paradoxical low flow low gradient severe aortic stenosis.  Transthoracic echocardiogram reveals a Sievers type I bicuspid aortic valve.  There is severe thickening and restricted leaflet mobility involving the fused leaflets with mild to moderate thickening and modest leaflets restriction involving the noncoronary leaflet.  Peak velocity across aortic valve measured 3.4 m/s corresponding to mean transvalvular gradient estimated 27 mmHg.  The DVI was 0.22 and the stroke volume index only 24.1 mL/m.  Diagnostic cardiac catheterization revealed normal coronary artery anatomy with no significant coronary artery disease.  Mean transvalvular gradient measured at catheterization was 20 mmHg corresponding to aortic valve area calculated 1.25 cm.  Because of the patient's progressive symptoms I agree that it seems reasonable to proceed with elective aortic valve replacement.  In the absence of symptoms one could make an argument for careful watchful waiting on medical therapy.  Dobutamine stress echocardiography and/or exercise treadmill echocardiography could be considered to confirm the functional severity of disease, but the patient's symptoms are worrisome enough that it seems reasonable to proceed with surgery.  Risks associated with conventional surgery should be relatively low and the patient may be reasonable candidate for minimally invasive approach.   Plan:  The  patient and her husband were counseled at length regarding treatment alternatives for management of severe aortic stenosis including continued medical therapy versus proceeding with aortic valve replacement in the near future.  We directly reviewed images from her recent echocardiogram and discussed the hemodynamic findings including the fact that Doppler parameters and transvalvular gradient measured at catheterization remain in the moderate to severe category.  We discussed the potential role for stress echocardiography and/or continued watchful waiting on medical therapy.  The natural history of aortic stenosis was reviewed, as was long term prognosis with medical therapy alone.  Surgical options were discussed at length including conventional surgical aortic valve replacement through either a full median sternotomy or using minimally invasive techniques.  Other alternatives including rapid-deployment bioprosthetic tissue valve replacement, transcatheter aortic valve replacement, patch enlargement of the aortic root, and stentless porcine aortic root replacement were discussed.  Discussion was held comparing the relative risks of mechanical valve replacement with need for lifelong anticoagulation versus use of a bioprosthetic tissue valve and the associated potential for late structural valve deterioration and failure.  Expectations for the patient's postoperative convalescence were discussed.   At this point the patient is very anxious to proceed with surgical  intervention as soon as practical.  As a next step she will undergo cardiac gated CT angiogram of the aortic root and aortic valve to confirm anatomical characteristics consistent with severe aortic stenosis, evaluate the size of the aortic root, and the feasibility of surgical intervention with or without need for root enlargement.  CT angiography of the aorta and iliac vessels will be performed in order to evaluate the feasibility of peripheral  arterial cannulation for surgery.  We tentatively plan to proceed with surgery on July 26, 2017.  Patient will return for follow-up prior to surgery to review the results of her CT angiograms on Jul 22, 2017.  All questions answered.   I spent in excess of 90 minutes during the conduct of this office consultation and >50% of this time involved direct face-to-face encounter with the patient for counseling and/or coordination of their care.   Salvatore Decent. Cornelius Moras, MD 07/14/2017 4:39 PM

## 2017-07-15 ENCOUNTER — Other Ambulatory Visit: Payer: Self-pay | Admitting: *Deleted

## 2017-07-15 DIAGNOSIS — I712 Thoracic aortic aneurysm, without rupture, unspecified: Secondary | ICD-10-CM

## 2017-07-15 DIAGNOSIS — I7409 Other arterial embolism and thrombosis of abdominal aorta: Secondary | ICD-10-CM

## 2017-07-15 DIAGNOSIS — I350 Nonrheumatic aortic (valve) stenosis: Secondary | ICD-10-CM

## 2017-07-15 DIAGNOSIS — Z01818 Encounter for other preprocedural examination: Secondary | ICD-10-CM

## 2017-07-19 ENCOUNTER — Encounter: Payer: BC Managed Care – PPO | Admitting: Thoracic Surgery (Cardiothoracic Vascular Surgery)

## 2017-07-20 ENCOUNTER — Ambulatory Visit (HOSPITAL_COMMUNITY)
Admission: RE | Admit: 2017-07-20 | Discharge: 2017-07-20 | Disposition: A | Discharge status: Home / Self Care | Payer: BC Managed Care – PPO | Source: Ambulatory Visit | Attending: Thoracic Surgery (Cardiothoracic Vascular Surgery) | Admitting: Thoracic Surgery (Cardiothoracic Vascular Surgery)

## 2017-07-20 ENCOUNTER — Other Ambulatory Visit (HOSPITAL_COMMUNITY): Payer: BC Managed Care – PPO

## 2017-07-20 DIAGNOSIS — Z01818 Encounter for other preprocedural examination: Secondary | ICD-10-CM | POA: Insufficient documentation

## 2017-07-20 DIAGNOSIS — I2510 Atherosclerotic heart disease of native coronary artery without angina pectoris: Secondary | ICD-10-CM | POA: Insufficient documentation

## 2017-07-20 DIAGNOSIS — I350 Nonrheumatic aortic (valve) stenosis: Secondary | ICD-10-CM | POA: Insufficient documentation

## 2017-07-20 DIAGNOSIS — I712 Thoracic aortic aneurysm, without rupture, unspecified: Secondary | ICD-10-CM

## 2017-07-20 DIAGNOSIS — I7409 Other arterial embolism and thrombosis of abdominal aorta: Secondary | ICD-10-CM

## 2017-07-20 DIAGNOSIS — I700 Atherosclerosis of aorta: Secondary | ICD-10-CM | POA: Diagnosis not present

## 2017-07-20 MED ORDER — NITROGLYCERIN 0.4 MG SL SUBL: 0.8000 mg | SUBLINGUAL_TABLET | SUBLINGUAL | Status: DC | PRN

## 2017-07-20 MED ORDER — IOPAMIDOL (ISOVUE-370) INJECTION 76%
100.0000 mL | Freq: Once | INTRAVENOUS | Status: AC | PRN
  Administered 2017-07-20: 100 mL via INTRAVENOUS

## 2017-07-20 MED ORDER — METOPROLOL TARTRATE 5 MG/5ML IV SOLN
INTRAVENOUS | Status: AC
  Filled 2017-07-20: qty 10

## 2017-07-20 MED ORDER — METOPROLOL TARTRATE 5 MG/5ML IV SOLN
5.0000 mg | INTRAVENOUS | Status: DC | PRN
  Administered 2017-07-20: 5 mg via INTRAVENOUS
  Filled 2017-07-20: qty 5

## 2017-07-20 MED ORDER — IOPAMIDOL (ISOVUE-370) INJECTION 76%
INTRAVENOUS | Status: AC
  Filled 2017-07-20: qty 100

## 2017-07-21 NOTE — Pre-Procedure Instructions (Signed)
NARDA FUNDORA  07/21/2017      [facility] - [place], [place] - [address] AT [facility] [address] [facility] [address] [place] [address] Phone: [phone] Fax: [phone]    Your procedure is scheduled on June 4  Report to [facility] Admitting at 0530 A.M.  Call this number if you have problems the morning of surgery:  [phone]   Remember:  NOTHING TO EAT OR DRINK AFTER MIDNIGHT    Take these medicines the morning of surgery with A SIP OF WATER  acetaminophen (TYLENOL) albuterol (PROVENTIL HFA;VENTOLIN HFA) ALPRAZolam (XANAX) atenolol (TENORMIN) buPROPion (WELLBUTRIN XL) fluticasone (FLONASE) Fluticasone-Salmeterol (ADVAIR) omeprazole (PRILOSEC) traMADol (ULTRAM)   7 days prior to surgery STOP taking any Aspirin(unless otherwise instructed by your surgeon), Aleve, Naproxen, Ibuprofen, Motrin, Advil, Goody's, BC's, all herbal medications, fish oil, and all vitamins     Do not wear jewelry, make-up or nail polish.  Do not wear lotions, powders, or perfumes, or deodorant.  Do not shave 48 hours prior to surgery.    Do not bring valuables to the hospital.  [facility] is not responsible for any belongings or valuables.  Contacts, dentures or bridgework may not be worn into surgery.  Leave your suitcase in the car.  After surgery it may be brought to your room.  For patients admitted to the hospital, discharge time will be determined by your treatment team.  Patients discharged the day of surgery will not be allowed to drive home.    Special instructions:   [facility]- Preparing For Surgery  Before surgery, you can play an important role. Because skin is not sterile, your skin needs to be as free of germs as possible. You can reduce the number of germs on your skin by washing with CHG (chlorahexidine gluconate) Soap before surgery.  CHG is an antiseptic cleaner which kills germs and bonds with the skin to continue killing  germs even after washing.    Oral Hygiene is also important to reduce your risk of infection.  Remember - BRUSH YOUR TEETH THE MORNING OF SURGERY WITH YOUR REGULAR TOOTHPASTE  Please do not use if you have an allergy to CHG or antibacterial soaps. If your skin becomes reddened/irritated stop using the CHG.  Do not shave (including legs and underarms) for at least 48 hours prior to first CHG shower. It is OK to shave your face.  Please follow these instructions carefully.   1. Shower the NIGHT BEFORE SURGERY and the MORNING OF SURGERY with CHG.   2. If you chose to wash your hair, wash your hair first as usual with your normal shampoo.  3. After you shampoo, rinse your hair and body thoroughly to remove the shampoo.  4. Use CHG as you would any other liquid soap. You can apply CHG directly to the skin and wash gently with a scrungie or a clean washcloth.   5. Apply the CHG Soap to your body ONLY FROM THE NECK DOWN.  Do not use on open wounds or open sores. Avoid contact with your eyes, ears, mouth and genitals (private parts). Wash Face and genitals (private parts)  with your normal soap.  6. Wash thoroughly, paying special attention to the area where your surgery will be performed.  7. Thoroughly rinse your body with warm water from the neck down.  8. DO NOT shower/wash with your normal soap after using and rinsing off the CHG Soap.  9. Pat yourself dry with a CLEAN TOWEL.  10. Wear CLEAN PAJAMAS to bed the night before surgery, wear comfortable clothes the morning of surgery  11. Place CLEAN SHEETS on your bed the night of your first shower and DO NOT SLEEP WITH PETS.    Day of Surgery:  Do not apply any deodorants/lotions.  Please wear clean clothes to the hospital/surgery center.   Remember to brush your teeth WITH YOUR REGULAR TOOTHPASTE.    Please read over the following fact sheets that you were given.

## 2017-07-22 ENCOUNTER — Ambulatory Visit: Payer: BC Managed Care – PPO | Admitting: Thoracic Surgery (Cardiothoracic Vascular Surgery)

## 2017-07-22 ENCOUNTER — Encounter (HOSPITAL_COMMUNITY): Payer: Self-pay

## 2017-07-22 ENCOUNTER — Ambulatory Visit (HOSPITAL_COMMUNITY)
Admission: RE | Admit: 2017-07-22 | Discharge: 2017-07-22 | Disposition: A | Discharge status: Home / Self Care | Payer: BC Managed Care – PPO | Source: Ambulatory Visit | Attending: Thoracic Surgery (Cardiothoracic Vascular Surgery) | Admitting: Thoracic Surgery (Cardiothoracic Vascular Surgery)

## 2017-07-22 ENCOUNTER — Other Ambulatory Visit: Payer: Self-pay

## 2017-07-22 ENCOUNTER — Encounter: Payer: Self-pay | Admitting: Thoracic Surgery (Cardiothoracic Vascular Surgery)

## 2017-07-22 ENCOUNTER — Encounter (HOSPITAL_COMMUNITY)
Admission: RE | Admit: 2017-07-22 | Discharge: 2017-07-22 | Disposition: A | Discharge status: Home / Self Care | Payer: BC Managed Care – PPO | Source: Ambulatory Visit | Attending: Thoracic Surgery (Cardiothoracic Vascular Surgery) | Admitting: Thoracic Surgery (Cardiothoracic Vascular Surgery)

## 2017-07-22 ENCOUNTER — Ambulatory Visit: Payer: BC Managed Care – PPO | Admitting: Cardiology

## 2017-07-22 VITALS — BP 126/84 | HR 92 | Resp 18 | Ht 64.0 in | Wt 178.0 lb

## 2017-07-22 DIAGNOSIS — Z01818 Encounter for other preprocedural examination: Secondary | ICD-10-CM | POA: Diagnosis not present

## 2017-07-22 DIAGNOSIS — Z01812 Encounter for preprocedural laboratory examination: Secondary | ICD-10-CM | POA: Insufficient documentation

## 2017-07-22 DIAGNOSIS — I350 Nonrheumatic aortic (valve) stenosis: Secondary | ICD-10-CM | POA: Diagnosis present

## 2017-07-22 DIAGNOSIS — I2510 Atherosclerotic heart disease of native coronary artery without angina pectoris: Secondary | ICD-10-CM | POA: Diagnosis not present

## 2017-07-22 HISTORY — DX: Nausea with vomiting, unspecified: R11.2

## 2017-07-22 HISTORY — DX: Other specified postprocedural states: Z98.890

## 2017-07-22 HISTORY — DX: Family history of other specified conditions: Z84.89

## 2017-07-22 HISTORY — DX: Inflammatory liver disease, unspecified: K75.9

## 2017-07-22 HISTORY — DX: Gastro-esophageal reflux disease without esophagitis: K21.9

## 2017-07-22 HISTORY — DX: Anemia, unspecified: D64.9

## 2017-07-22 HISTORY — DX: Sleep apnea, unspecified: G47.30

## 2017-07-22 LAB — PULMONARY FUNCTION TEST
DL/VA % pred: 94 %
DL/VA: 4.49 ml/min/mmHg/L
DLCO UNC % PRED: 64 %
DLCO UNC: 15.19 ml/min/mmHg
DLCO cor % pred: 65 %
DLCO cor: 15.48 ml/min/mmHg
FEF 25-75 PRE: 2.35 L/s
FEF 25-75 Post: 1.74 L/sec
FEF2575-%Change-Post: -25 %
FEF2575-%Pred-Post: 77 %
FEF2575-%Pred-Pre: 104 %
FEV1-%CHANGE-POST: -7 %
FEV1-%PRED-POST: 77 %
FEV1-%Pred-Pre: 83 %
FEV1-PRE: 2.05 L
FEV1-Post: 1.9 L
FEV1FVC-%Change-Post: -3 %
FEV1FVC-%PRED-PRE: 109 %
FEV6-%Change-Post: -2 %
FEV6-%PRED-POST: 74 %
FEV6-%Pred-Pre: 76 %
FEV6-PRE: 2.36 L
FEV6-Post: 2.29 L
FEV6FVC-%PRED-PRE: 104 %
FEV6FVC-%Pred-Post: 104 %
FVC-%CHANGE-POST: -4 %
FVC-%Pred-Post: 71 %
FVC-%Pred-Pre: 74 %
FVC-POST: 2.29 L
FVC-Pre: 2.4 L
POST FEV6/FVC RATIO: 100 %
PRE FEV6/FVC RATIO: 100 %
Post FEV1/FVC ratio: 83 %
Pre FEV1/FVC ratio: 85 %
RV % PRED: 96 %
RV: 1.93 L
TLC % pred: 88 %
TLC: 4.4 L

## 2017-07-22 LAB — CBC
HCT: 35.9 % — ABNORMAL LOW (ref 36.0–46.0)
Hemoglobin: 11.6 g/dL — ABNORMAL LOW (ref 12.0–15.0)
MCH: 29.1 pg (ref 26.0–34.0)
MCHC: 32.3 g/dL (ref 30.0–36.0)
MCV: 90 fL (ref 78.0–100.0)
Platelets: 204 10*3/uL (ref 150–400)
RBC: 3.99 MIL/uL (ref 3.87–5.11)
RDW: 12.3 % (ref 11.5–15.5)
WBC: 5.9 10*3/uL (ref 4.0–10.5)

## 2017-07-22 LAB — URINALYSIS, ROUTINE W REFLEX MICROSCOPIC
BACTERIA UA: NONE SEEN
Bilirubin Urine: NEGATIVE
Glucose, UA: NEGATIVE mg/dL
Hgb urine dipstick: NEGATIVE
KETONES UR: NEGATIVE mg/dL
NITRITE: NEGATIVE
PROTEIN: NEGATIVE mg/dL
Specific Gravity, Urine: 1.008 (ref 1.005–1.030)
pH: 7 (ref 5.0–8.0)

## 2017-07-22 LAB — COMPREHENSIVE METABOLIC PANEL
ALBUMIN: 4.3 g/dL (ref 3.5–5.0)
ALK PHOS: 67 U/L (ref 38–126)
ALT: 13 U/L — ABNORMAL LOW (ref 14–54)
AST: 22 U/L (ref 15–41)
Anion gap: 12 (ref 5–15)
BILIRUBIN TOTAL: 0.7 mg/dL (ref 0.3–1.2)
BUN: 11 mg/dL (ref 6–20)
CALCIUM: 9.6 mg/dL (ref 8.9–10.3)
CO2: 23 mmol/L (ref 22–32)
Chloride: 105 mmol/L (ref 101–111)
Creatinine, Ser: 0.85 mg/dL (ref 0.44–1.00)
GFR calc non Af Amer: >60 mL/min (ref >60)
GLUCOSE: 102 mg/dL — AB (ref 65–99)
POTASSIUM: 4.6 mmol/L (ref 3.5–5.1)
SODIUM: 140 mmol/L (ref 135–145)
TOTAL PROTEIN: 6.8 g/dL (ref 6.5–8.1)

## 2017-07-22 LAB — APTT: APTT: 22 s — AB (ref 24–36)

## 2017-07-22 LAB — SURGICAL PCR SCREEN
MRSA, PCR: NEGATIVE
STAPHYLOCOCCUS AUREUS: NEGATIVE

## 2017-07-22 LAB — HEMOGLOBIN A1C
Hgb A1c MFr Bld: 5.5 % (ref 4.8–5.6)
Mean Plasma Glucose: 111.15 mg/dL

## 2017-07-22 LAB — PROTIME-INR
INR: 0.84
PROTHROMBIN TIME: 11.4 s (ref 11.4–15.2)

## 2017-07-22 LAB — ABO/RH: ABO/RH(D): A POS

## 2017-07-22 MED ORDER — ALBUTEROL SULFATE (2.5 MG/3ML) 0.083% IN NEBU
2.5000 mg | INHALATION_SOLUTION | Freq: Once | RESPIRATORY_TRACT | Status: AC
  Administered 2017-07-22: 2.5 mg via RESPIRATORY_TRACT

## 2017-07-22 NOTE — Patient Instructions (Signed)
   Continue taking all current medications without change through the day before surgery.  Have nothing to eat or drink after midnight the night before surgery.  On the morning of surgery take only atenolol, Prilosec, and Wellbutrin with a sip of water.  You may take Xanax as well if desired.

## 2017-07-22 NOTE — Progress Notes (Signed)
Pre-op Cardiac Surgery  Carotid Findings:  No evidence of stenosis in bilateral carotid arteries.   Upper Extremity Right Left  Brachial Pressures 127 120  Radial Waveforms Triphasic Triphasic  Ulnar Waveforms Triphasic Triphasic  Palmar Arch (Allen's Test) WNL WNL    Eular Tobi Groesbeck, BS, RDMS, RVT

## 2017-07-22 NOTE — Progress Notes (Signed)
[address]       [name] [address]             [phone]     CARDIOTHORACIC SURGERY OFFICE NOTE  Referring Provider is Baldo Daub, MD PCP is Shirlean Mylar, MD   HPI:  Patient returns the office today for follow-up of severe symptomatic aortic stenosis with tentative plan to proceed with elective aortic valve replacement next week.  She was originally seen in consultation on Jul 14, 2017.  She reports no new problems or complaints although she does admit that she is somewhat anxious about her upcoming surgery.   Current Outpatient Medications  Medication Sig Dispense Refill  . acetaminophen (TYLENOL) 500 MG tablet Take 1,000 mg by mouth every 6 (six) hours as needed.    [name] albuterol (PROVENTIL HFA;VENTOLIN HFA) 108 (90 Base) MCG/ACT inhaler Inhale 2 puffs into the lungs every 6 (six) hours as needed for wheezing or shortness of breath.    . ALPRAZolam (XANAX) 0.25 MG tablet Take 0.25 mg by mouth 2 (two) times daily.     [name] amoxicillin (AMOXIL) 500 MG capsule Take 2,000 mg (4 tablets) 45 minutes prior to dental procedures. 12 capsule 2  . atenolol (TENORMIN) 50 MG tablet Take 50 mg by mouth daily.    [name] buPROPion (WELLBUTRIN XL) 150 MG 24 hr tablet Take 150 mg by mouth daily.    . calcium carbonate (OS-CAL) 600 MG TABS tablet Take 600 mg by mouth daily.     . fluticasone (FLONASE) 50 MCG/ACT nasal spray Place 1 spray into both nostrils daily as needed.     . Fluticasone-Salmeterol (ADVAIR) 250-50 MCG/DOSE AEPB Inhale 1 puff into the lungs 2 (two) times daily.    . mirtazapine (REMERON) 45 MG tablet Take 45 mg by mouth at bedtime.    . Multiple Vitamin (MULTIVITAMIN) capsule Take 1 capsule by mouth daily.    [name] omeprazole (PRILOSEC) 20 MG capsule Take 20 mg by mouth daily.    . traMADol (ULTRAM) 50 MG tablet Take 1 tablet (50 mg total) by mouth every 8 (eight) hours as needed. 30 tablet 0   No current facility-administered medications for this visit.        Physical Exam:   BP 126/84 (BP Location: Left Arm, Patient Position: Sitting, Cuff Size: Normal)   Pulse 92   Resp 18   Ht 5\' 4"  (1.626 m)   Wt 178 lb (80.7 kg)   SpO2 97% Comment: RA  BMI 30.55 kg/m   General:  Well-appearing  Chest:   Clear to auscultation  CV:   Regular rate and rhythm with systolic murmur  Incisions:  n/a  Abdomen:  Soft nontender  Extremities:  Warm and well-perfused  Diagnostic Tests:  Cardiac TAVR CT  TECHNIQUE: The patient was scanned on a [employer]. A 120 kV retrospective scan was triggered in the descending thoracic aorta at 111 HU's. Gantry rotation speed was 250 msecs and collimation was .6 mm. No beta blockade or nitro were given. The 3D data set was reconstructed in 5% intervals of the R-R cycle. Systolic and diastolic phases were analyzed on a dedicated work station using MPR, MIP and VRT modes. The patient received 80 cc of contrast.  FINDINGS: Aortic Valve: Trileaflet, but functionally bileaflet aortic valve with co-joined left and right leaflet with moderate calcifications.  Aorta: Normal size, no calcifications or atherosclerosis, no dissection.  Sinotubular Junction: 27 x 26 mm  Ascending Thoracic  Aorta: 34 x 33 mm  Aortic Arch: 23 x 21 mm  Descending Thoracic Aorta: 22 x 21 mm  Sinus of Valsalva Measurements:  Non-coronary: 31 mm  Right -coronary: 27 mm  Left -coronary: 29 mm  Virtual Basal Annulus Measurements:  Maximum/Minimum Diameter: 28.3 x 22.2 mm  Mean Diameter: 24.4 mm  Perimeter: 79.2 mm  Area: 468 mm2  Coronary Arteries: Normal origin. Left dominance, minimal non-calcified plaque in the proximal LAD.  IMPRESSION: 1. Trileaflet, but functionally bileaflet aortic valve with co-joined left and right leaflet with moderate calcifications.  2. Thoracic aorta: normal size, no calcifications or atherosclerosis, no dissection.  3. No thrombus in the left atrial  appendage.  4. Coronary Arteries: Normal origin. Left dominance, minimal non-calcified plaque in the proximal LAD.  5. No other congenital anomalies identified.   Electronically Signed   By: Tobias Alexander   On: 07/20/2017 12:55   CT ANGIOGRAPHY CHEST, ABDOMEN AND PELVIS  TECHNIQUE: Multidetector CT imaging through the chest, abdomen and pelvis was performed using the standard protocol during bolus administration of intravenous contrast. Multiplanar reconstructed images and MIPs were obtained and reviewed to evaluate the vascular anatomy.  CONTRAST:  ISOVUE-370 IOPAMIDOL (ISOVUE-370) INJECTION 76%  COMPARISON:  Chest CT 06/24/2010. Coronary calcium score CT scan 07/01/2017.  FINDINGS: CTA CHEST FINDINGS  Cardiovascular: Heart size is normal. There is no significant pericardial fluid, thickening or pericardial calcification. Aortic atherosclerosis. No definite coronary artery calcifications. Severe thickening calcification of the aortic valve.  Mediastinum/Lymph Nodes: No pathologically enlarged mediastinal or hilar lymph nodes. Esophagus is unremarkable in appearance. No axillary lymphadenopathy.  Lungs/Pleura: No suspicious appearing pulmonary nodules or masses. No acute consolidative airspace disease. No pleural effusions.  Musculoskeletal/Soft Tissues: There are no aggressive appearing lytic or blastic lesions noted in the visualized portions of the skeleton.  CTA ABDOMEN AND PELVIS FINDINGS  Hepatobiliary: Diffuse low attenuation throughout the hepatic parenchyma, suggestive of hepatic steatosis (difficult to say for certain on today's contrast enhanced examination). No suspicious cystic or solid hepatic lesions. No intra or extrahepatic biliary ductal dilatation. Status post cholecystectomy.  Pancreas: No pancreatic mass. No pancreatic ductal dilatation. No pancreatic or peripancreatic fluid or inflammatory changes.  Spleen: Small  splenules adjacent to the spleen. Otherwise, unremarkable.  Adrenals/Urinary Tract: Bilateral kidneys and bilateral adrenal glands are normal in appearance. No hydroureteronephrosis. Urinary bladder is normal in appearance.  Stomach/Bowel: Normal appearance of the stomach. No pathologic dilatation of small bowel or colon. Normal appendix.  Vascular/Lymphatic: Aortic atherosclerosis, without evidence of aneurysm or dissection in the abdominal or pelvic vasculature. Vascular findings and measurements pertinent to potential TAVR procedure, as detailed below. Celiac axis, superior mesenteric artery and inferior mesenteric artery are all widely patent without hemodynamically significant stenosis. Single renal arteries bilaterally are widely patent without hemodynamically significant stenosis. No lymphadenopathy noted in the abdomen or pelvis.  Reproductive: Status post hysterectomy. Ovaries are not confidently identified may be surgically absent or atrophic.  Other: No significant volume of ascites.  No pneumoperitoneum.  Musculoskeletal: There are no aggressive appearing lytic or blastic lesions noted in the visualized portions of the skeleton.  VASCULAR MEASUREMENTS PERTINENT TO TAVR:  AORTA:  Minimal Aortic Diameter-13 x 14 mm  Severity of Aortic Calcification-moderate  RIGHT PELVIS:  Right Common Iliac Artery -  Minimal Diameter-8.9 x 7.8 mm  Tortuosity-mild  Calcification-mild  Right External Iliac Artery -  Minimal Diameter-7.1 x 6.4 mm  Tortuosity-mild  Calcification-none  Right Common Femoral Artery -  Minimal Diameter-7.1 x 6.4 mm  Tortuosity-mild  Calcification-none  LEFT PELVIS:  Left Common Iliac Artery -  Minimal Diameter-8.0 x 8.0 mm  Tortuosity-mild  Calcification-mild  Left External Iliac Artery -  Minimal Diameter-7.4 x 6.9 mm  Tortuosity-mild  Calcification-none  Left Common Femoral Artery  -  Minimal Diameter-6.8 x 7.2 mm  Tortuosity-mild  Calcification-none  Review of the MIP images confirms the above findings.  IMPRESSION: 1. Vascular findings and measurements pertinent to potential TAVR procedure, as detailed above. 2. Severe thickening and calcification of the aortic valve, compatible with the reported clinical history of severe aortic stenosis. 3. Probable hepatic steatosis. 4. Additional incidental findings, as above.  Aortic Atherosclerosis (ICD10-I70.0).   Electronically Signed   By: Trudie Reedaniel  Entrikin M.D.   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  I have personally reviewed the patient's recent transthoracic echocardiogram, diagnostic cardiac catheterization and CT angiograms.  She describes gradual progression of symptoms of worsening fatigue and increased shortness of breath consistent with chronic diastolic congestive heart failure, New York Heart Association functional class II.  She has also been having some intermittent episodes of substernal chest discomfort which could be angina, although symptoms are somewhat atypical and could be related to anxiety.  Transthoracic echocardiogram reveals a Sievers type I bicuspid aortic valve.  There is severe thickening and restricted leaflet mobility involving the fused leaflets with mild to moderate thickening and modest leaflets restriction involving the noncoronary leaflet.  Peak velocity across aortic valve measured 3.4 m/s  corresponding to mean transvalvular gradient estimated 27 mmHg.  The DVI was 0.22 and the stroke volume index only 24.1 mL/m.  Diagnostic cardiac catheterization revealed normal coronary artery anatomy with no significant coronary artery disease.  Mean transvalvular gradient measured at catheterization was 20 mmHg corresponding to aortic valve area calculated 1.25 cm.    CT angiography confirms the presence of a functionally bicuspid aortic valve with restricted leaflet mobility.  The size and anatomy of aortic annulus and aortic root appears sufficient to allow conventional surgical aortic valve replacement using a stented bioprosthetic tissue valve, potentially using minimally invasive techniques.  Risks associated with conventional surgery should be low.  Alternatives include continued watchful waiting versus proceeding with surgical intervention in the near future.  I have discussed the patient's case at length over the telephone with Dr. Dulce SellarMunley and I agree with his assessment that it seems reasonable to proceed with elective aortic valve replacement.   Plan:  The patient and her husband were again counseled at length regarding treatment alternatives for management of severe aortic stenosis including continued medical therapy versus proceeding with aortic valve replacement in the near future.  The natural history of aortic stenosis was reviewed, as was long term prognosis with medical therapy alone.  Other alternatives including rapid-deployment bioprosthetic tissue valve replacement, transcatheter aortic valve replacement, patch enlargement of the aortic root, stentless porcine aortic root replacement, valve repair, the Ross autograft procedure, and homograft aortic root replacement were discussed.  Discussion was held comparing the relative risks of mechanical valve replacement with need for lifelong  anticoagulation versus use of a bioprosthetic tissue valve and the associated potential for late  structural valve deterioration and failure.  This discussion was placed in the context of the patient's particular circumstances, and as a result the patient specifically requests that their valve be replaced using a bioprosthetic tissue valve.  The potential advantages and disadvantages associated with use of a rapid-deployment bioprosthetic aortic valve were discussed, including the risks of paravalvular leak, need for permanent pacemaker placement, and expectations for long-term durability.  The patient understands and accepts all potential associated risks of surgery including but not limited to risk of death, stroke, myocardial infarction, congestive heart failure, respiratory failure, renal failure, pneumonia, bleeding requiring blood transfusion and or reexploration, arrhythmia, heart block or bradycardia requiring permanent pacemaker, aortic dissection or other major vascular complication, pleural effusions or other delayed complications related to continued congestive heart failure, and other late complications related to valve replacement including structural valve deterioration and failure, thrombosis, endocarditis, or paravalvular leak.  Alternative surgical approaches have been discussed including a comparison between conventional sternotomy and minimally-invasive techniques.  The relative risks and benefits of each have been reviewed as they pertain to the patient's specific circumstances, and all of their questions have been addressed.  Specific risks potentially related to the minimally-invasive approach were discussed at length, including but not limited to risk of conversion to full or partial sternotomy, aortic dissection or other major vascular complication, unilateral acute lung injury or pulmonary edema, phrenic nerve dysfunction or paralysis, rib fracture, chronic pain, lung hernia, or lymphocele.     I spent in excess of 15 minutes during the conduct of this office consultation and >50%  of this time involved direct face-to-face encounter with the patient for counseling and/or coordination of their care.   Salvatore Decent. Cornelius Moras, MD 07/22/2017 12:20 PM

## 2017-07-22 NOTE — Progress Notes (Signed)
Blood gas from PAT appointment clotted, will need to draw DOS

## 2017-07-22 NOTE — Progress Notes (Signed)
PCP - Shirlean [facility]ararol [facility] at [facility] Cardiologist - Norman HerrlichBrian Munley  Chest x-ray - 07/22/2017  EKG - 07/22/2017  Stress Test - denies ECHO - 2019 Cardiac Cath - 2019  Sleep Study - 2016  Patient denies shortness of breath, fever, cough and chest pain at PAT appointment   Patient verbalized understanding of instructions that were given to them at the PAT appointment. Patient was also instructed that they will need to review over the PAT instructions again at home before surgery.

## 2017-07-25 MED ORDER — NITROGLYCERIN IN D5W 200-5 MCG/ML-% IV SOLN
2.0000 ug/min | INTRAVENOUS | Status: DC
  Filled 2017-07-25: qty 250

## 2017-07-25 MED ORDER — PLASMA-LYTE 148 IV SOLN
INTRAVENOUS | Status: AC
  Administered 2017-07-26: 500 mL
  Filled 2017-07-25: qty 2.5

## 2017-07-25 MED ORDER — CEFUROXIME SODIUM 1.5 G IV SOLR
1.5000 g | INTRAVENOUS | Status: AC
  Administered 2017-07-26: 1.5 g via INTRAVENOUS
  Filled 2017-07-25: qty 1.5

## 2017-07-25 MED ORDER — KENNESTONE BLOOD CARDIOPLEGIA VIAL
13.0000 mL | Freq: Once | Status: DC
  Filled 2017-07-25 (×3): qty 13

## 2017-07-25 MED ORDER — SODIUM CHLORIDE 0.9 % IV SOLN
1250.0000 mg | INTRAVENOUS | Status: AC
Start: 2017-07-26 — End: 2017-07-26
  Administered 2017-07-26: 1250 mg via INTRAVENOUS
  Filled 2017-07-25: qty 1250

## 2017-07-25 MED ORDER — HEPARIN SODIUM (PORCINE) 1000 UNIT/ML IJ SOLN
INTRAMUSCULAR | Status: DC
  Filled 2017-07-25: qty 30

## 2017-07-25 MED ORDER — TRANEXAMIC ACID (OHS) PUMP PRIME SOLUTION
2.0000 mg/kg | INTRAVENOUS | Status: DC
  Filled 2017-07-25: qty 1.61

## 2017-07-25 MED ORDER — SODIUM CHLORIDE 0.9 % IV SOLN
INTRAVENOUS | Status: AC
  Administered 2017-07-26: .9 [IU]/h via INTRAVENOUS
  Filled 2017-07-25: qty 1

## 2017-07-25 MED ORDER — KENNESTONE BLOOD CARDIOPLEGIA (KBC) MANNITOL SYRINGE (20%, 32ML)
32.0000 mL | Freq: Once | INTRAVENOUS | Status: DC
  Filled 2017-07-25: qty 32

## 2017-07-25 MED ORDER — SODIUM CHLORIDE 0.9 % IV SOLN
750.0000 mg | INTRAVENOUS | Status: AC
  Administered 2017-07-26: 750 mg via INTRAVENOUS
  Filled 2017-07-25: qty 750

## 2017-07-25 MED ORDER — DOPAMINE-DEXTROSE 3.2-5 MG/ML-% IV SOLN
0.0000 ug/kg/min | INTRAVENOUS | Status: DC
  Filled 2017-07-25: qty 250

## 2017-07-25 MED ORDER — EPINEPHRINE PF 1 MG/ML IJ SOLN
0.0000 ug/min | INTRAMUSCULAR | Status: DC
  Filled 2017-07-25: qty 4

## 2017-07-25 MED ORDER — KENNESTONE BLOOD CARDIOPLEGIA (KBC) MANNITOL SYRINGE (20%, 32ML)
32.0000 mL | Freq: Once | INTRAVENOUS | Status: DC
  Filled 2017-07-25 (×3): qty 32

## 2017-07-25 MED ORDER — VANCOMYCIN HCL 1000 MG IV SOLR
INTRAVENOUS | Status: AC
  Administered 2017-07-26: 1000 mL
  Filled 2017-07-25: qty 1000

## 2017-07-25 MED ORDER — DEXMEDETOMIDINE HCL IN NACL 400 MCG/100ML IV SOLN
0.1000 ug/kg/h | INTRAVENOUS | Status: AC
  Administered 2017-07-26: 0.7 ug/kg/h via INTRAVENOUS
  Filled 2017-07-25: qty 100

## 2017-07-25 MED ORDER — SODIUM CHLORIDE 0.9 % IV SOLN
30.0000 ug/min | INTRAVENOUS | Status: AC
  Administered 2017-07-26: 25 ug/min via INTRAVENOUS
  Filled 2017-07-25: qty 20

## 2017-07-25 MED ORDER — KENNESTONE BLOOD CARDIOPLEGIA VIAL
13.0000 mL | Freq: Once | Status: DC
  Filled 2017-07-25: qty 13

## 2017-07-25 MED ORDER — TRANEXAMIC ACID (OHS) BOLUS VIA INFUSION
15.0000 mg/kg | INTRAVENOUS | Status: AC
  Administered 2017-07-26: 1210.5 mg via INTRAVENOUS
  Filled 2017-07-25: qty 1211

## 2017-07-25 MED ORDER — POTASSIUM CHLORIDE 2 MEQ/ML IV SOLN
80.0000 meq | INTRAVENOUS | Status: DC
  Filled 2017-07-25: qty 40

## 2017-07-25 MED ORDER — SODIUM CHLORIDE 0.9 % IV SOLN
1.5000 mg/kg/h | INTRAVENOUS | Status: AC
  Administered 2017-07-26: 1.5 mg/kg/h via INTRAVENOUS
  Filled 2017-07-25: qty 25

## 2017-07-25 MED ORDER — MAGNESIUM SULFATE 50 % IJ SOLN
40.0000 meq | INTRAMUSCULAR | Status: DC
  Filled 2017-07-25: qty 9.85

## 2017-07-25 MED ORDER — MILRINONE LACTATE IN DEXTROSE 20-5 MG/100ML-% IV SOLN
0.1250 ug/kg/min | INTRAVENOUS | Status: DC
  Filled 2017-07-25: qty 100

## 2017-07-25 NOTE — H&P (Signed)
[address]       [name] [address]             [phone]          CARDIOTHORACIC SURGERY HISTORY AND PHYSICAL EXAM  Referring Provider is Baldo Daub, MD PCP is Shirlean Mylar, MD      Chief Complaint  Patient presents with  . Aortic Stenosis    new patient evaluation, CATH 07/08/2017    HPI:  Patient is a 62 year old female with history of anxiety and hypertension who has been referred for surgical consultation to discuss treatment options for management of recently discovered bicuspid aortic valve with aortic stenosis.  Patient states that she has been fairly healthy all of her life.  She recently underwent routine physical examination by her gynecologist and was noted to have a systolic murmur on physical exam.  An echocardiogram was performed June 14, 2017 demonstrating the presence of a functionally bicuspid aortic valve with moderate to severe aortic stenosis.  Patient was referred for cardiology consultation and evaluated by Dr. Dulce Sellar on June 21, 2017 who noted that the echocardiogram Doppler parameters were consistent with moderate aortic stenosis but the patient had low stroke volume index and significant symptoms of atypical chest pain with exertional shortness of breath.  Paradoxical low flow normal ejection fraction severe aortic stenosis was suspected and a CT coronary artery valve calcium score was ordered.  CT confirmed the presence of heavy aortic valve calcification and the patient underwent diagnostic cardiac catheterization by Dr. Excell Seltzer on Jul 08, 2017.  The patient was found to have normal coronary arteries with no significant coronary artery disease.  Mean transvalvular gradient across aortic valve was measured 20 mmHg at catheterization, corresponding to aortic valve area calculated 1.25 cm.  Right-sided pressures were normal.  The patient was referred for elective surgical consultation.  The patient is married and lives with  her husband in [place].  The patient is a retired Chartered loss adjuster.  She has remained reasonably active physically although she has had problems with arthritis in both knees which limits her physical activity to some degree.  She describes a gradual progression over the last several months of decreased energy, increased fatigue, and exertional shortness of breath.  She has had some chest pain or chest tightness intermittently which does not seem to be related to physical activity.  She has had some dizzy spells and palpitations.  She denies any exertional chest pain.  She denies any PND, orthopnea, or lower extremity edema.  She has not had syncope.  Patient returns the office today for follow-up of severe symptomatic aortic stenosis with tentative plan to proceed with elective aortic valve replacement next week.  She was originally seen in consultation on Jul 14, 2017.  She reports no new problems or complaints although she does admit that she is somewhat anxious about her upcoming surgery.   Past Medical History:  Diagnosis Date  . Anemia    in the past   . Anxiety   . Asthma   . Depression   . Family history of adverse reaction to anesthesia    mom had nausea  . GERD (gastroesophageal reflux disease)   . Hepatitis    Hepatitis A in 3rd grade?  [name] Hypertension   . PONV (postoperative nausea and vomiting)   . Sleep apnea    uses CPAP, 12, starts at 6    Past Surgical History:  Procedure Laterality Date  .  ABDOMINAL HYSTERECTOMY    . BLADDER SURGERY  2011  . CARPAL TUNNEL RELEASE  2012  . CHOLECYSTECTOMY    . RIGHT/LEFT HEART CATH AND CORONARY ANGIOGRAPHY N/A 07/08/2017   Procedure: RIGHT/LEFT HEART CATH AND CORONARY ANGIOGRAPHY;  Surgeon: Tonny Bollman, MD;  Location: [facility] INVASIVE CV LAB;  Service: Cardiovascular;  Laterality: N/A;  . TONSILLECTOMY      Family History  Problem Relation Age of Onset  . Stroke Mother   . Leukemia Mother   . Stroke Father   . Sleep apnea  Father   . Valvular heart disease Brother   . Heart Problems Maternal Aunt   . Valvular heart disease Paternal Grandfather   . Valvular heart disease Maternal Aunt     Social History Social History   Tobacco Use  . Smoking status: Never Smoker  . Smokeless tobacco: Never Used  Substance Use Topics  . Alcohol use: No    Alcohol/week: 0.0 oz  . Drug use: No    Prior to Admission medications   Medication Sig Start Date End Date Taking? Authorizing Provider  acetaminophen (TYLENOL) 500 MG tablet Take 1,000 mg by mouth every 6 (six) hours as needed.   Yes [provider]  albuterol (PROVENTIL HFA;VENTOLIN HFA) 108 (90 Base) MCG/ACT inhaler Inhale 2 puffs into the lungs every 6 (six) hours as needed for wheezing or shortness of breath.   Yes [provider]  ALPRAZolam (XANAX) 0.25 MG tablet Take 0.25 mg by mouth 2 (two) times daily.    Yes [provider]  amoxicillin (AMOXIL) 500 MG capsule Take 2,000 mg (4 tablets) 45 minutes prior to dental procedures. 06/21/17  Yes Baldo Daub, MD  atenolol (TENORMIN) 50 MG tablet Take 50 mg by mouth daily.   Yes [provider]  buPROPion (WELLBUTRIN XL) 150 MG 24 hr tablet Take 150 mg by mouth daily. 05/27/16  Yes [provider]  calcium carbonate (OS-CAL) 600 MG TABS tablet Take 600 mg by mouth daily.    Yes [provider]  Fluticasone-Salmeterol (ADVAIR) 250-50 MCG/DOSE AEPB Inhale 1 puff into the lungs 2 (two) times daily.   Yes [provider]  mirtazapine (REMERON) 45 MG tablet Take 45 mg by mouth at bedtime.   Yes [provider]  Multiple Vitamin (MULTIVITAMIN) capsule Take 1 capsule by mouth daily.   Yes [provider]  omeprazole (PRILOSEC) 20 MG capsule Take 20 mg by mouth daily.   Yes [provider]  fluticasone (FLONASE) 50 MCG/ACT nasal spray Place 1 spray into both nostrils daily as needed.     [provider]  traMADol (ULTRAM) 50  MG tablet Take 1 tablet (50 mg total) by mouth every 8 (eight) hours as needed. 07/30/16   Cammy Copa, MD    Allergies  Allergen Reactions  . Codeine Itching  . Prednisone Itching     Review of Systems:              General:                      normal appetite, decreased energy, no weight gain, no weight loss, no fever             Cardiac:                       + chest pain with exertion, + chest pain at rest, + SOB with exertion, no resting SOB, no PND,  no orthopnea, no palpitations, no arrhythmia, no atrial fibrillation, no LE edema, + dizzy spells, no syncope             Respiratory:                 + shortness of breath, no home oxygen, no productive cough, no dry cough, no bronchitis, no wheezing, no hemoptysis, no asthma, no pain with inspiration or cough, + sleep apnea, + CPAP at night             GI:                               no difficulty swallowing, no reflux, + frequent heartburn, no hiatal hernia, no abdominal pain, no constipation, no diarrhea, no hematochezia, no hematemesis, no melena             GU:                              no dysuria,  no frequency, no urinary tract infection, no hematuria, no kidney stones, no kidney disease             Vascular:                     no pain suggestive of claudication, no pain in feet, no leg cramps, no varicose veins, no DVT, no non-healing foot ulcer             Neuro:                         no stroke, no TIA's, no seizures, no headaches, no temporary blindness one eye,  no slurred speech, no peripheral neuropathy, no chronic pain, no instability of gait, no memory/cognitive dysfunction             Musculoskeletal:         + arthritis, + joint swelling, no myalgias, mild difficulty walking, normal mobility              Skin:                            no rash, no itching, no skin infections, no pressure sores or ulcerations             Psych:                         + anxiety, + depression, + nervousness, no unusual recent  stress             Eyes:                           no blurry vision, no floaters, no recent vision changes, + wears glasses or contacts             ENT:                            no hearing loss, no loose or painful teeth, no dentures, last saw dentist 07/10/2017             Hematologic:               + easy bruising, no abnormal  bleeding, no clotting disorder, no frequent epistaxis             Endocrine:                   no diabetes, does not check CBG's at home                                                       Physical Exam:              BP 110/73 (BP Location: Right Arm, Patient Position: Sitting, Cuff Size: Normal)   Pulse 81   Resp 18   Ht 5\' 4"  (1.626 m)   Wt 175 lb (79.4 kg)   SpO2 98% Comment: RA  BMI 30.04 kg/m              General:                      Mildly obese,  well-appearing             HEENT:                       Unremarkable              Neck:                           no JVD, no bruits, no adenopathy              Chest:                          clear to auscultation, symmetrical breath sounds, no wheezes, no rhonchi              CV:                              RRR, grade III/VI crescendo/decrescendo murmur heard best at RSB,  no diastolic murmur             Abdomen:                    soft, non-tender, no masses              Extremities:                 warm, well-perfused, pulses diminished but palpable, no LE edema             Rectal/GU                   Deferred             Neuro:                         Grossly non-focal and symmetrical throughout             Skin:                            Clean and dry, no rashes, no breakdown   Diagnostic Tests:  Transthoracic Echocardiography  Patient: Cordie, Beazley MR #: [id number] Study Date: 06/14/2017 Gender:  F Age: 13 Height: 160 cm Weight: 81.2 kg BSA: 1.93 m^2 Pt. Status: Room:  ATTENDING Elane Fritz REFERRING  Ernest Mallick [id number] PERFORMING [facility], Outpatient SONOGRAPHER Thurman Coyer  cc:  ------------------------------------------------------------------- LV EF: 55% - 60%  ------------------------------------------------------------------- Indications: Murmur 785.2.  ------------------------------------------------------------------- History: Risk factors: Hypertension.  ------------------------------------------------------------------- Study Conclusions  - Left ventricle: The cavity size was normal. Wall thickness was increased in a pattern of mild LVH. Systolic function was normal. The estimated ejection fraction was in the range of 55% to 60%. Wall motion was normal; there were no regional wall motion abnormalities. Findings consistent with left ventricular diastolic dysfunction, grade indeterminate. - Aortic valve: Moderately calcified annulus. Trileaflet; moderately calcified leaflets. There was moderate to severe stenosis. Peak velocity (S): 339 cm/s. Mean gradient (S): 27 mm Hg. Valve area (VTI): 0.82 cm^2. Valve area (Vmax): 0.76 cm^2. Valve area (Vmean): 0.75 cm^2. - Atrial septum: No defect or patent foramen ovale was identified.  ------------------------------------------------------------------- Study data: No prior study was available for comparison. Study status: Routine. Procedure: The patient reported no pain pre or post test. Transthoracic echocardiography. Image quality was adequate. Study completion: There were no complications. Transthoracic echocardiography. M-mode, complete 2D, spectral Doppler, and color Doppler. Birthdate: Patient birthdate: Apr 02, 1955. Age: Patient is 62 yr old. Sex: Gender: female. BMI: 31.7 kg/m^2. Blood pressure: 105/72 Patient status: Outpatient. Study date: Study date: 06/14/2017. Study time: 10:11 AM. Location: Echo  laboratory.  -------------------------------------------------------------------  ------------------------------------------------------------------- Left ventricle: The cavity size was normal. Wall thickness was increased in a pattern of mild LVH. Systolic function was normal. The estimated ejection fraction was in the range of 55% to 60%. Wall motion was normal; there were no regional wall motion abnormalities. Findings consistent with left ventricular diastolic dysfunction, grade indeterminate. There was no evidence of elevated ventricular filling pressure by Doppler parameters.  ------------------------------------------------------------------- Aortic valve: Moderately calcified annulus. Trileaflet; moderately calcified leaflets. Doppler: There was moderate to severe stenosis. There was no regurgitation. VTI ratio of LVOT to aortic valve: 0.24. Valve area (VTI): 0.82 cm^2. Indexed valve area (VTI): 0.42 cm^2/m^2. Peak velocity ratio of LVOT to aortic valve: 0.22. Valve area (Vmax): 0.76 cm^2. Indexed valve area (Vmax): 0.39 cm^2/m^2. Mean velocity ratio of LVOT to aortic valve: 0.22. Valve area (Vmean): 0.75 cm^2. Indexed valve area (Vmean): 0.39 cm^2/m^2. Mean gradient (S): 27 mm Hg. Peak gradient (S): 46 mm Hg.  ------------------------------------------------------------------- Aorta: Aortic root: The aortic root was normal in size.  ------------------------------------------------------------------- Mitral valve: Normal thickness leaflets . Doppler: There was trivial regurgitation. Peak gradient (D): 2 mm Hg.  ------------------------------------------------------------------- Left atrium: The atrium was normal in size.  ------------------------------------------------------------------- Atrial septum: No defect or patent foramen ovale was identified.  ------------------------------------------------------------------- Right ventricle:  The cavity size was normal. Wall thickness was normal. Systolic function was normal.  ------------------------------------------------------------------- Pulmonic valve: The valve appears to be grossly normal. Doppler: There was no significant regurgitation.  ------------------------------------------------------------------- Tricuspid valve: Structurally normal valve. Leaflet separation was normal. Doppler: Transvalvular velocity was within the normal range. There was no significant regurgitation.  ------------------------------------------------------------------- Right atrium: The atrium was normal in size.  ------------------------------------------------------------------- Pericardium: There was no pericardial effusion.  ------------------------------------------------------------------- Systemic veins: Inferior vena cava: The vessel was normal in size. The respirophasic diameter changes were in the normal range (>= 50%), consistent with normal central venous pressure.  ------------------------------------------------------------------- Measurements  Left ventricle Value Reference LV ID, ED, PLAX chordal (L) 35.8 mm 43 - 52 LV ID, ES, PLAX chordal 26.7 mm  23 - 38 LV fx shortening, PLAX chordal (L) 25 % >=29 LV PW thickness, ED 10.7 mm ---------- IVS/LV PW ratio, ED 1.09 <=1.3 Stroke volume, 2D 69 ml ---------- Stroke volume/bsa, 2D 36 ml/m^2 ---------- LV ejection fraction, 1-p A4C 59 % ---------- LV end-diastolic volume, 2-p 82 ml ---------- LV end-systolic volume, 2-p 36 ml ---------- LV ejection fraction, 2-p 57 % ---------- Stroke volume, 2-p  47 ml ---------- LV end-diastolic volume/bsa, 2-p 43 ml/m^2 ---------- LV end-systolic volume/bsa, 2-p 18 ml/m^2 ---------- Stroke volume/bsa, 2-p 24.1 ml/m^2 ---------- LV e&', lateral 9.14 cm/s ---------- LV E/e&', lateral 8.44 ---------- LV e&', medial 7.29 cm/s ---------- LV E/e&', medial 10.58 ---------- LV e&', average 8.22 cm/s ---------- LV E/e&', average 9.39 ----------  Ventricular septum Value Reference IVS thickness, ED 11.7 mm ----------  LVOT Value Reference LVOT ID, S 21 mm ---------- LVOT area 3.46 cm^2 ---------- LVOT peak velocity, S 74.2 cm/s ---------- LVOT mean velocity, S 52.8 cm/s ---------- LVOT VTI, S 20 cm ---------- LVOT peak gradient, S 2 mm Hg ----------  Aortic valve Value Reference Aortic valve peak velocity, S 339 cm/s ---------- Aortic valve mean velocity, S 244 cm/s ---------- Aortic valve VTI, S 84.4 cm ---------- Aortic mean gradient, S 27 mm Hg ---------- Aortic peak gradient, S 46 mm Hg ---------- VTI ratio, LVOT/AV 0.24 ---------- Aortic valve area, VTI 0.82 cm^2 ---------- Aortic valve area/bsa, VTI 0.42 cm^2/m^2 ---------- Velocity ratio, peak,  LVOT/AV 0.22 ---------- Aortic valve area, peak velocity 0.76 cm^2 ---------- Aortic valve area/bsa, peak 0.39 cm^2/m^2 ---------- velocity Velocity ratio, mean, LVOT/AV 0.22 ---------- Aortic valve area, mean velocity 0.75 cm^2 ---------- Aortic valve area/bsa, mean 0.39 cm^2/m^2 ---------- velocity  Aorta Value Reference Aortic root ID, ED 32 mm ----------  Left atrium Value Reference LA ID, A-P, ES 33 mm ---------- LA ID/bsa, A-P 1.71 cm/m^2 <=2.2 LA volume, S 58 ml ---------- LA volume/bsa, S 30 ml/m^2 ---------- LA volume, ES, 1-p A4C 53.5 ml ---------- LA volume/bsa, ES, 1-p A4C 27.7 ml/m^2 ---------- LA volume, ES, 1-p A2C 58.9 ml ---------- LA volume/bsa, ES, 1-p A2C 30.5 ml/m^2 ----------  Mitral valve Value Reference Mitral E-wave peak velocity 77.1 cm/s ---------- Mitral A-wave peak velocity 67.8 cm/s ---------- Mitral deceleration time 165 ms 150 - 230 Mitral peak gradient, D 2 mm Hg ---------- Mitral E/A ratio, peak 1.1 ----------  Right atrium Value Reference RA ID, S-I, ES, A4C 41.4 mm 34 - 49 RA area, ES, A4C 10.4 cm^2 8.3 - 19.5 RA volume, ES, A/L 21.4 ml ---------- RA volume/bsa, ES, A/L 11.1 ml/m^2 ----------  Right ventricle  Value Reference TAPSE 27.7 mm ---------- RV s&', lateral, S 10.8 cm/s ----------  Legend: (L) and (H) mark values outside specified reference range.  ------------------------------------------------------------------- Prepared and Electronically Authenticated by  Prentice Docker, MD 2019-04-23T11:41:44   RIGHT/LEFT HEART CATH AND CORONARY ANGIOGRAPHY  Conclusion   1. Angiographically normal coronary arteries (left dominant) 2. Moderate aortic stenosis based on hemodynamic criteria with mean gradient 20 mmHg and AVA 1.25 square cm 3. Normal intracardiac filling pressures (right and left heart)  Data reviewed: Agree patient likely has severe paradoxical LFLG aortic stenosis with moderately increased transaortic gradients in the setting of low stroke volume (SVI by echo 24) and heavy aortic calcification (aortic valve agaston score > 1275). Plans as outlined by Dr Dulce Sellar with pending cardiac surgical evaluation for AVR.  Indications   Severe aortic stenosis [I35.0 (ICD-10-CM)]  Procedural Details/Technique   Technical Details INDICATION: Symptomatic aortic stenosis  PROCEDURAL DETAILS:  There was an indwelling IV in a right antecubital vein. Using normal sterile technique, the IV was changed out for a 5 Fr brachial sheath over a 0.018 inch wire. The right wrist was then prepped, draped, and anesthetized with 1% lidocaine. Using the modified Seldinger technique a 5/6 French Slender sheath was placed in the right radial artery. Intra-arterial verapamil was administered through the radial artery sheath. IV heparin was administered after a JR4 catheter was advanced into the central aorta. A Swan-Ganz catheter was used for the right heart catheterization. Standard protocol was followed for recording of right heart pressures and sampling of oxygen saturations. Fick cardiac output  was calculated. Standard Judkins catheters were used for selective coronary angiography. The aortic valve could not be crossed with a J wire. The valve is crossed with a straight tip wire directed by an AL-1 catheter. There were no immediate procedural complications. The patient was transferred to the post catheterization recovery area for further monitoring.     Estimated blood loss <50 mL.  During this procedure the patient was administered the following to achieve and maintain moderate conscious sedation: Versed 2.5 mg, Fentanyl 25 mcg, while the patient's heart rate, blood pressure, and oxygen saturation were continuously monitored. The period of conscious sedation was 41 minutes, of which I was present face-to-face 100% of this time.  Coronary Findings   Diagnostic  Dominance: Left  Left Main  Vessel is angiographically normal.  Left Circumflex  Vessel is angiographically normal.  First Obtuse Marginal Branch  Vessel is angiographically normal.  Right Coronary Artery  Vessel is angiographically normal.  Intervention   No interventions have been documented.  Left Heart   Aortic Valve There is moderate aortic valve stenosis. The aortic valve is calcified. There is restricted aortic valve motion. Restricted aortic valve leaflet motion with heavy calcification by plain fluoroscopy. Peak-to-peak gradient 25 mmHg, mean gradient 20 mmHg, calculated AVA 1.25 square cm  Coronary Diagrams   Diagnostic Diagram       Implants       No implant documentation for this case.  MERGE Images   Show images for CARDIAC CATHETERIZATION   Link to Procedure Log   Procedure Log    Hemo Data    Most Recent Value  Fick Cardiac Output 6.11 L/min  Fick Cardiac Output Index 3.32 (L/min)/BSA  Aortic Mean Gradient 19.5 mmHg  Aortic Peak Gradient 25 mmHg  Aortic Valve Area 1.25  Aortic Value Area Index 0.68 cm2/BSA  RA A Wave 7 mmHg  RA V Wave 4 mmHg  RA Mean 5 mmHg  RV Systolic  Pressure 24 mmHg  RV Diastolic Pressure 2 mmHg  RV EDP 7 mmHg  PA Systolic Pressure 21 mmHg  PA Diastolic Pressure 8 mmHg  PA Mean 14 mmHg  PW A Wave 10 mmHg  PW V Wave 8 mmHg  PW Mean 8 mmHg  AO Systolic Pressure 102 mmHg  AO Diastolic Pressure 60 mmHg  AO Mean 78 mmHg  LV Systolic Pressure 131 mmHg  LV Diastolic Pressure 8 mmHg  LV EDP 13 mmHg  Arterial Occlusion Pressure Extended Systolic Pressure 103 mmHg  Arterial Occlusion Pressure Extended Diastolic Pressure 59 mmHg  Arterial Occlusion Pressure Extended Mean Pressure 79 mmHg  Left Ventricular Apex Extended Systolic Pressure 128 mmHg  Left Ventricular Apex Extended Diastolic Pressure 7 mmHg  Left Ventricular Apex Extended EDP Pressure 12 mmHg  QP/QS 1  TPVR Index 4.21 HRUI  TSVR Index 23.49 HRUI  PVR SVR Ratio 0.08  TPVR/TSVR Ratio 0.18    Cardiac TAVR CT  TECHNIQUE: The patient was scanned on a [employer]. A 120 kV retrospective scan was triggered in the descending thoracic aorta at 111 HU's. Gantry rotation speed was 250 msecs and collimation was .6 mm. No beta blockade or nitro were given. The 3D data set was reconstructed in 5% intervals of the R-R cycle. Systolic and diastolic phases were analyzed on a dedicated work station using MPR, MIP and VRT modes. The patient received 80 cc of contrast.  FINDINGS: Aortic Valve: Trileaflet, but functionally bileaflet aortic valve with co-joined left and right leaflet with moderate calcifications.  Aorta: Normal size, no calcifications or atherosclerosis, no dissection.  Sinotubular Junction: 27 x 26 mm  Ascending Thoracic Aorta: 34 x 33 mm  Aortic Arch: 23 x 21 mm  Descending Thoracic Aorta: 22 x 21 mm  Sinus of Valsalva Measurements:  Non-coronary: 31 mm  Right -coronary: 27 mm  Left -coronary: 29 mm  Virtual Basal Annulus Measurements:  Maximum/Minimum Diameter: 28.3 x 22.2 mm  Mean Diameter: 24.4 mm  Perimeter: 79.2  mm  Area: 468 mm2  Coronary Arteries: Normal origin. Left dominance, minimal non-calcified plaque in the proximal LAD.  IMPRESSION: 1. Trileaflet, but functionally bileaflet aortic valve with co-joined left and right leaflet with moderate calcifications.  2. Thoracic aorta: normal size, no calcifications or atherosclerosis, no dissection.  3. No thrombus in the left atrial appendage.  4. Coronary Arteries: Normal origin. Left dominance, minimal non-calcified plaque in the proximal LAD.  5. No other congenital anomalies identified.   Electronically Signed By: Tobias Alexander On: 07/20/2017 12:55   CT ANGIOGRAPHY CHEST, ABDOMEN AND PELVIS  TECHNIQUE: Multidetector CT imaging through the chest, abdomen and pelvis was performed using the standard protocol during bolus administration of intravenous contrast. Multiplanar reconstructed images and MIPs were obtained and reviewed to evaluate the vascular anatomy.  CONTRAST: ISOVUE-370 IOPAMIDOL (ISOVUE-370) INJECTION 76%  COMPARISON: Chest CT 06/24/2010. Coronary calcium score CT scan 07/01/2017.  FINDINGS: CTA CHEST FINDINGS  Cardiovascular: Heart size is normal. There is no significant pericardial fluid, thickening or pericardial calcification. Aortic atherosclerosis. No definite coronary artery calcifications. Severe thickening calcification of the aortic valve.  Mediastinum/Lymph Nodes: No pathologically enlarged mediastinal or hilar lymph nodes. Esophagus is unremarkable in appearance. No axillary lymphadenopathy.  Lungs/Pleura: No suspicious appearing pulmonary nodules or masses. No acute consolidative airspace disease. No pleural effusions.  Musculoskeletal/Soft Tissues: There are no aggressive appearing lytic or blastic lesions noted in the visualized portions of the skeleton.  CTA ABDOMEN AND PELVIS FINDINGS  Hepatobiliary: Diffuse low attenuation throughout the  hepatic parenchyma, suggestive of hepatic steatosis (difficult to say for certain on today's contrast enhanced examination). No suspicious cystic or solid hepatic lesions. No intra or extrahepatic biliary ductal dilatation. Status post cholecystectomy.  Pancreas: No pancreatic mass. No pancreatic ductal dilatation. No pancreatic or peripancreatic fluid or inflammatory changes.  Spleen: Small splenules adjacent to the spleen. Otherwise, unremarkable.  Adrenals/Urinary Tract: Bilateral kidneys and bilateral adrenal glands are normal in appearance. No hydroureteronephrosis. Urinary bladder is normal in appearance.  Stomach/Bowel: Normal appearance of the stomach. No pathologic dilatation of small bowel or colon. Normal appendix.  Vascular/Lymphatic: Aortic atherosclerosis, without evidence of aneurysm or dissection in the abdominal or pelvic vasculature. Vascular findings and measurements pertinent to potential TAVR procedure, as detailed below. Celiac axis, superior mesenteric artery and inferior mesenteric artery are all widely patent without hemodynamically significant stenosis. Single renal arteries bilaterally are widely patent without hemodynamically  significant stenosis. No lymphadenopathy noted in the abdomen or pelvis.  Reproductive: Status post hysterectomy. Ovaries are not confidently identified may be surgically absent or atrophic.  Other: No significant volume of ascites. No pneumoperitoneum.  Musculoskeletal: There are no aggressive appearing lytic or blastic lesions noted in the visualized portions of the skeleton.  VASCULAR MEASUREMENTS PERTINENT TO TAVR:  AORTA:  Minimal Aortic Diameter-13 x 14 mm  Severity of Aortic Calcification-moderate  RIGHT PELVIS:  Right Common Iliac Artery -  Minimal Diameter-8.9 x 7.8 mm  Tortuosity-mild  Calcification-mild  Right External Iliac Artery -  Minimal Diameter-7.1 x 6.4  mm  Tortuosity-mild  Calcification-none  Right Common Femoral Artery -  Minimal Diameter-7.1 x 6.4 mm  Tortuosity-mild  Calcification-none  LEFT PELVIS:  Left Common Iliac Artery -  Minimal Diameter-8.0 x 8.0 mm  Tortuosity-mild  Calcification-mild  Left External Iliac Artery -  Minimal Diameter-7.4 x 6.9 mm  Tortuosity-mild  Calcification-none  Left Common Femoral Artery -  Minimal Diameter-6.8 x 7.2 mm  Tortuosity-mild  Calcification-none  Review of the MIP images confirms the above findings.  IMPRESSION: 1. Vascular findings and measurements pertinent to potential TAVR procedure, as detailed above. 2. Severe thickening and calcification of the aortic valve, compatible with the reported clinical history of severe aortic stenosis. 3. Probable hepatic steatosis. 4. Additional incidental findings, as above.  Aortic Atherosclerosis (ICD10-I70.0).   Electronically Signed By: Trudie Reedaniel Entrikin M.D. 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  I have personally reviewed the patient's recent transthoracic echocardiogram, diagnostic cardiac catheterization and CT angiograms. She describes gradual progression of symptoms of worsening fatigue and increased shortness of breath consistent with chronic diastolic congestive heart failure, New [place] Heart Association functional class II. She has also been having some intermittent episodes of substernal  chest discomfort which could be angina, although symptoms are somewhat atypical and could be related to anxiety. Transthoracic echocardiogram reveals a Sievers type I bicuspid aortic valve. There is severe thickening and restricted leaflet mobility involving the fused leaflets with mild to moderate thickening and modest leaflets restriction involving the noncoronary leaflet. Peak velocity across aortic valve measured 3.4 m/s corresponding to mean transvalvular gradient estimated 27 mmHg. The DVI was 0.22 and the stroke volume index only 24.1 mL/m. Diagnostic cardiac catheterization revealed normal coronary artery anatomy with no significant coronary artery disease. Mean transvalvular gradient measured at catheterization was 20 mmHg corresponding to aortic valve area calculated 1.25 cm.   CT angiography confirms the presence of a functionally bicuspid aortic valve with restricted leaflet mobility.  The size and anatomy of aortic annulus and aortic root appears sufficient to allow conventional surgical aortic valve replacement using a stented bioprosthetic tissue valve, potentially using minimally invasive techniques.  Risks associated with conventional surgery should be low.  Alternatives include continued watchful waiting versus proceeding with surgical intervention in the near future.  I have discussed the patient's case at length over the telephone with Dr. Dulce SellarMunley and I agree with his assessment that it seems reasonable to proceed with elective aortic valve replacement.  Plan:  The patient and her husband were again counseled at length regarding treatment alternatives for management of severe aortic stenosis including continued medical therapy versus proceeding with aortic valve replacement in the near future.  The natural history of aortic stenosis was reviewed, as was long term prognosis with medical therapy alone.  Other alternatives including rapid-deployment bioprosthetic tissue valve  replacement, transcatheter aortic valve replacement, patch enlargement of the aortic root, stentless porcine aortic root replacement, valve repair, the Ross autograft procedure, and homograft aortic root replacement were discussed.  Discussion was held comparing the relative risks of mechanical valve replacement with need for lifelong anticoagulation versus use of a bioprosthetic tissue valve and the associated potential for late structural valve deterioration and failure.  This discussion was placed in the context of the patient's particular circumstances, and as a result the patient specifically requests that their valve be replaced using a bioprosthetic tissue valve.  The potential advantages and disadvantages associated with use of a rapid-deployment bioprosthetic aortic valve were discussed, including the risks of paravalvular leak, need for permanent pacemaker placement, and expectations for long-term durability.  The patient understands and accepts all potential associated risks of surgery including but not limited to risk of death, stroke, myocardial infarction, congestive heart failure, respiratory failure, renal failure, pneumonia, bleeding requiring blood transfusion and or reexploration, arrhythmia, heart block or bradycardia requiring permanent pacemaker, aortic dissection or other major vascular complication, pleural effusions or other delayed complications related to continued congestive heart failure, and other late complications related to valve replacement including structural valve deterioration and failure, thrombosis, endocarditis, or paravalvular leak.  Alternative surgical approaches have been discussed including a comparison between conventional sternotomy and minimally-invasive techniques.  The relative risks and benefits of each have been reviewed as they pertain to the patient's specific circumstances, and all of their questions have been addressed.  Specific risks potentially related to  the minimally-invasive approach were discussed at length, including but not limited to risk of conversion to full or partial sternotomy, aortic dissection or other major vascular complication, unilateral acute lung injury or pulmonary edema, phrenic nerve dysfunction or paralysis, rib fracture, chronic pain, lung hernia, or lymphocele.     Salvatore Decent. Cornelius Moras, MD 07/22/2017 12:20 PM

## 2017-07-26 ENCOUNTER — Inpatient Hospital Stay (HOSPITAL_COMMUNITY)
Admission: RE | Admit: 2017-07-26 | Discharge: 2017-08-01 | DRG: 220 | Disposition: A | Discharge status: Home / Self Care | Payer: BC Managed Care – PPO | Source: Ambulatory Visit | Attending: Thoracic Surgery (Cardiothoracic Vascular Surgery) | Admitting: Thoracic Surgery (Cardiothoracic Vascular Surgery)

## 2017-07-26 ENCOUNTER — Inpatient Hospital Stay (HOSPITAL_COMMUNITY): Payer: BC Managed Care – PPO | Admitting: Emergency Medicine

## 2017-07-26 ENCOUNTER — Inpatient Hospital Stay (HOSPITAL_COMMUNITY): Payer: BC Managed Care – PPO

## 2017-07-26 ENCOUNTER — Encounter (HOSPITAL_COMMUNITY)
Admission: RE | Disposition: A | Discharge status: Home / Self Care | Payer: Self-pay | Source: Ambulatory Visit | Attending: Thoracic Surgery (Cardiothoracic Vascular Surgery)

## 2017-07-26 ENCOUNTER — Encounter (HOSPITAL_COMMUNITY): Payer: Self-pay | Admitting: *Deleted

## 2017-07-26 ENCOUNTER — Inpatient Hospital Stay (HOSPITAL_COMMUNITY): Payer: BC Managed Care – PPO | Admitting: Anesthesiology

## 2017-07-26 DIAGNOSIS — D696 Thrombocytopenia, unspecified: Secondary | ICD-10-CM | POA: Diagnosis not present

## 2017-07-26 DIAGNOSIS — E8770 Fluid overload, unspecified: Secondary | ICD-10-CM | POA: Diagnosis not present

## 2017-07-26 DIAGNOSIS — G4733 Obstructive sleep apnea (adult) (pediatric): Secondary | ICD-10-CM | POA: Diagnosis present

## 2017-07-26 DIAGNOSIS — F419 Anxiety disorder, unspecified: Secondary | ICD-10-CM | POA: Diagnosis present

## 2017-07-26 DIAGNOSIS — J9811 Atelectasis: Secondary | ICD-10-CM | POA: Diagnosis not present

## 2017-07-26 DIAGNOSIS — R110 Nausea: Secondary | ICD-10-CM | POA: Diagnosis not present

## 2017-07-26 DIAGNOSIS — Z006 Encounter for examination for normal comparison and control in clinical research program: Secondary | ICD-10-CM | POA: Diagnosis not present

## 2017-07-26 DIAGNOSIS — I447 Left bundle-branch block, unspecified: Secondary | ICD-10-CM | POA: Diagnosis not present

## 2017-07-26 DIAGNOSIS — I700 Atherosclerosis of aorta: Secondary | ICD-10-CM | POA: Diagnosis present

## 2017-07-26 DIAGNOSIS — I5032 Chronic diastolic (congestive) heart failure: Secondary | ICD-10-CM | POA: Diagnosis present

## 2017-07-26 DIAGNOSIS — I10 Essential (primary) hypertension: Secondary | ICD-10-CM | POA: Diagnosis present

## 2017-07-26 DIAGNOSIS — J90 Pleural effusion, not elsewhere classified: Secondary | ICD-10-CM

## 2017-07-26 DIAGNOSIS — Q231 Congenital insufficiency of aortic valve: Secondary | ICD-10-CM

## 2017-07-26 DIAGNOSIS — K219 Gastro-esophageal reflux disease without esophagitis: Secondary | ICD-10-CM | POA: Diagnosis present

## 2017-07-26 DIAGNOSIS — Z79899 Other long term (current) drug therapy: Secondary | ICD-10-CM

## 2017-07-26 DIAGNOSIS — D62 Acute posthemorrhagic anemia: Secondary | ICD-10-CM | POA: Diagnosis not present

## 2017-07-26 DIAGNOSIS — I350 Nonrheumatic aortic (valve) stenosis: Secondary | ICD-10-CM | POA: Diagnosis present

## 2017-07-26 DIAGNOSIS — M170 Bilateral primary osteoarthritis of knee: Secondary | ICD-10-CM | POA: Diagnosis present

## 2017-07-26 DIAGNOSIS — I440 Atrioventricular block, first degree: Secondary | ICD-10-CM | POA: Diagnosis not present

## 2017-07-26 DIAGNOSIS — Z7951 Long term (current) use of inhaled steroids: Secondary | ICD-10-CM

## 2017-07-26 DIAGNOSIS — J45909 Unspecified asthma, uncomplicated: Secondary | ICD-10-CM | POA: Diagnosis present

## 2017-07-26 DIAGNOSIS — Z953 Presence of xenogenic heart valve: Secondary | ICD-10-CM

## 2017-07-26 DIAGNOSIS — I110 Hypertensive heart disease with heart failure: Secondary | ICD-10-CM | POA: Diagnosis present

## 2017-07-26 HISTORY — DX: Presence of xenogenic heart valve: Z95.3

## 2017-07-26 HISTORY — PX: TEE WITHOUT CARDIOVERSION: SHX5443

## 2017-07-26 HISTORY — PX: AORTIC VALVE REPLACEMENT: SHX41

## 2017-07-26 LAB — BLOOD GAS, ARTERIAL
ACID-BASE EXCESS: 1.3 mmol/L (ref 0.0–2.0)
BICARBONATE: 25.9 mmol/L (ref 20.0–28.0)
Drawn by: 449841
FIO2: 21
O2 SAT: 98.6 %
PCO2 ART: 45.4 mmHg (ref 32.0–48.0)
PH ART: 7.375 (ref 7.350–7.450)
PO2 ART: 134 mmHg — AB (ref 83.0–108.0)
Patient temperature: 98.6

## 2017-07-26 LAB — POCT I-STAT, CHEM 8
BUN: 18 mg/dL (ref 6–20)
BUN: 15 mg/dL (ref 6–20)
BUN: 17 mg/dL (ref 6–20)
BUN: 17 mg/dL (ref 6–20)
BUN: 15 mg/dL (ref 6–20)
BUN: 14 mg/dL (ref 6–20)
BUN: 14 mg/dL (ref 6–20)
CALCIUM ION: 1.22 mmol/L (ref 1.15–1.40)
CALCIUM ION: 1 mmol/L — AB (ref 1.15–1.40)
CALCIUM ION: 1.1 mmol/L — AB (ref 1.15–1.40)
CALCIUM ION: 1.11 mmol/L — AB (ref 1.15–1.40)
CALCIUM ION: 1.12 mmol/L — AB (ref 1.15–1.40)
CALCIUM ION: 1.17 mmol/L (ref 1.15–1.40)
CHLORIDE: 101 mmol/L (ref 101–111)
CHLORIDE: 104 mmol/L (ref 101–111)
CREATININE: 0.7 mg/dL (ref 0.44–1.00)
Calcium, Ion: 1.17 mmol/L (ref 1.15–1.40)
Chloride: 102 mmol/L (ref 101–111)
Chloride: 104 mmol/L (ref 101–111)
Chloride: 101 mmol/L (ref 101–111)
Chloride: 105 mmol/L (ref 101–111)
Chloride: 107 mmol/L (ref 101–111)
Creatinine, Ser: 0.5 mg/dL (ref 0.44–1.00)
Creatinine, Ser: 0.7 mg/dL (ref 0.44–1.00)
Creatinine, Ser: 0.6 mg/dL (ref 0.44–1.00)
Creatinine, Ser: 0.6 mg/dL (ref 0.44–1.00)
Creatinine, Ser: 0.6 mg/dL (ref 0.44–1.00)
Creatinine, Ser: 0.6 mg/dL (ref 0.44–1.00)
GLUCOSE: 106 mg/dL — AB (ref 65–99)
GLUCOSE: 132 mg/dL — AB (ref 65–99)
Glucose, Bld: 124 mg/dL — ABNORMAL HIGH (ref 65–99)
Glucose, Bld: 164 mg/dL — ABNORMAL HIGH (ref 65–99)
Glucose, Bld: 159 mg/dL — ABNORMAL HIGH (ref 65–99)
Glucose, Bld: 113 mg/dL — ABNORMAL HIGH (ref 65–99)
Glucose, Bld: 133 mg/dL — ABNORMAL HIGH (ref 65–99)
HCT: 26 % — ABNORMAL LOW (ref 36.0–46.0)
HCT: 26 % — ABNORMAL LOW (ref 36.0–46.0)
HCT: 27 % — ABNORMAL LOW (ref 36.0–46.0)
HCT: 23 % — ABNORMAL LOW (ref 36.0–46.0)
HCT: 24 % — ABNORMAL LOW (ref 36.0–46.0)
HCT: 24 % — ABNORMAL LOW (ref 36.0–46.0)
HEMATOCRIT: 26 % — AB (ref 36.0–46.0)
HEMOGLOBIN: 8.8 g/dL — AB (ref 12.0–15.0)
HEMOGLOBIN: 8.8 g/dL — AB (ref 12.0–15.0)
HEMOGLOBIN: 8.2 g/dL — AB (ref 12.0–15.0)
HEMOGLOBIN: 8.2 g/dL — AB (ref 12.0–15.0)
Hemoglobin: 9.2 g/dL — ABNORMAL LOW (ref 12.0–15.0)
Hemoglobin: 7.8 g/dL — ABNORMAL LOW (ref 12.0–15.0)
Hemoglobin: 8.8 g/dL — ABNORMAL LOW (ref 12.0–15.0)
POTASSIUM: 3.8 mmol/L (ref 3.5–5.1)
POTASSIUM: 3.7 mmol/L (ref 3.5–5.1)
Potassium: 3.9 mmol/L (ref 3.5–5.1)
Potassium: 3.8 mmol/L (ref 3.5–5.1)
Potassium: 4.6 mmol/L (ref 3.5–5.1)
Potassium: 3.3 mmol/L — ABNORMAL LOW (ref 3.5–5.1)
Potassium: 4.9 mmol/L (ref 3.5–5.1)
SODIUM: 138 mmol/L (ref 135–145)
SODIUM: 140 mmol/L (ref 135–145)
Sodium: 140 mmol/L (ref 135–145)
Sodium: 140 mmol/L (ref 135–145)
Sodium: 140 mmol/L (ref 135–145)
Sodium: 141 mmol/L (ref 135–145)
Sodium: 138 mmol/L (ref 135–145)
TCO2: 28 mmol/L (ref 22–32)
TCO2: 23 mmol/L (ref 22–32)
TCO2: 26 mmol/L (ref 22–32)
TCO2: 28 mmol/L (ref 22–32)
TCO2: 22 mmol/L (ref 22–32)
TCO2: 22 mmol/L (ref 22–32)
TCO2: 22 mmol/L (ref 22–32)

## 2017-07-26 LAB — PROTIME-INR
INR: 1.38
PROTHROMBIN TIME: 16.9 s — AB (ref 11.4–15.2)

## 2017-07-26 LAB — POCT I-STAT 3, ART BLOOD GAS (G3+)
ACID-BASE DEFICIT: 3 mmol/L — AB (ref 0.0–2.0)
ACID-BASE DEFICIT: 4 mmol/L — AB (ref 0.0–2.0)
ACID-BASE DEFICIT: 5 mmol/L — AB (ref 0.0–2.0)
Acid-base deficit: 2 mmol/L (ref 0.0–2.0)
BICARBONATE: 22.4 mmol/L (ref 20.0–28.0)
Bicarbonate: 24.7 mmol/L (ref 20.0–28.0)
Bicarbonate: 23.1 mmol/L (ref 20.0–28.0)
Bicarbonate: 21.9 mmol/L (ref 20.0–28.0)
Bicarbonate: 21.8 mmol/L (ref 20.0–28.0)
O2 SAT: 99 %
O2 SAT: 99 %
O2 SAT: 99 %
O2 Saturation: 100 %
O2 Saturation: 100 %
PCO2 ART: 39.5 mmHg (ref 32.0–48.0)
PH ART: 7.42 (ref 7.350–7.450)
PH ART: 7.375 (ref 7.350–7.450)
PH ART: 7.396 (ref 7.350–7.450)
PH ART: 7.293 — AB (ref 7.350–7.450)
PO2 ART: 119 mmHg — AB (ref 83.0–108.0)
Patient temperature: 36.5
TCO2: 26 mmol/L (ref 22–32)
TCO2: 24 mmol/L (ref 22–32)
TCO2: 24 mmol/L (ref 22–32)
TCO2: 23 mmol/L (ref 22–32)
TCO2: 23 mmol/L (ref 22–32)
pCO2 arterial: 38 mmHg (ref 32.0–48.0)
pCO2 arterial: 35.8 mmHg (ref 32.0–48.0)
pCO2 arterial: 41.2 mmHg (ref 32.0–48.0)
pCO2 arterial: 44.7 mmHg (ref 32.0–48.0)
pH, Arterial: 7.33 — ABNORMAL LOW (ref 7.350–7.450)
pO2, Arterial: 413 mmHg — ABNORMAL HIGH (ref 83.0–108.0)
pO2, Arterial: 189 mmHg — ABNORMAL HIGH (ref 83.0–108.0)
pO2, Arterial: 164 mmHg — ABNORMAL HIGH (ref 83.0–108.0)
pO2, Arterial: 136 mmHg — ABNORMAL HIGH (ref 83.0–108.0)

## 2017-07-26 LAB — CREATININE, SERUM
Creatinine, Ser: 0.66 mg/dL (ref 0.44–1.00)
GFR calc Af Amer: >60 mL/min (ref >60)
GFR calc non Af Amer: >60 mL/min (ref >60)

## 2017-07-26 LAB — CBC
HCT: 27 % — ABNORMAL LOW (ref 36.0–46.0)
HEMATOCRIT: 26.2 % — AB (ref 36.0–46.0)
HEMOGLOBIN: 9 g/dL — AB (ref 12.0–15.0)
Hemoglobin: 8.8 g/dL — ABNORMAL LOW (ref 12.0–15.0)
MCH: 29.8 pg (ref 26.0–34.0)
MCH: 30.6 pg (ref 26.0–34.0)
MCHC: 33.3 g/dL (ref 30.0–36.0)
MCHC: 33.6 g/dL (ref 30.0–36.0)
MCV: 89.4 fL (ref 78.0–100.0)
MCV: 91 fL (ref 78.0–100.0)
PLATELETS: 125 10*3/uL — AB (ref 150–400)
PLATELETS: 139 10*3/uL — AB (ref 150–400)
RBC: 3.02 MIL/uL — AB (ref 3.87–5.11)
RBC: 2.88 MIL/uL — ABNORMAL LOW (ref 3.87–5.11)
RDW: 12.5 % (ref 11.5–15.5)
RDW: 12.6 % (ref 11.5–15.5)
WBC: 12.1 10*3/uL — AB (ref 4.0–10.5)
WBC: 13.3 10*3/uL — AB (ref 4.0–10.5)

## 2017-07-26 LAB — PLATELET COUNT: PLATELETS: 146 10*3/uL — AB (ref 150–400)

## 2017-07-26 LAB — APTT: APTT: 36 s (ref 24–36)

## 2017-07-26 LAB — GLUCOSE, CAPILLARY
GLUCOSE-CAPILLARY: 109 mg/dL — AB (ref 65–99)
GLUCOSE-CAPILLARY: 128 mg/dL — AB (ref 65–99)
GLUCOSE-CAPILLARY: 115 mg/dL — AB (ref 65–99)
Glucose-Capillary: 89 mg/dL (ref 65–99)
Glucose-Capillary: 102 mg/dL — ABNORMAL HIGH (ref 65–99)
Glucose-Capillary: 81 mg/dL (ref 65–99)
Glucose-Capillary: 126 mg/dL — ABNORMAL HIGH (ref 65–99)
Glucose-Capillary: 110 mg/dL — ABNORMAL HIGH (ref 65–99)

## 2017-07-26 LAB — HEMOGLOBIN AND HEMATOCRIT, BLOOD
HEMATOCRIT: 22.6 % — AB (ref 36.0–46.0)
HEMOGLOBIN: 7.4 g/dL — AB (ref 12.0–15.0)

## 2017-07-26 LAB — PREPARE RBC (CROSSMATCH)

## 2017-07-26 LAB — MAGNESIUM: MAGNESIUM: 2.7 mg/dL — AB (ref 1.7–2.4)

## 2017-07-26 SURGERY — REPLACEMENT, AORTIC VALVE, MINIMALLY INVASIVE
Anesthesia: General | Site: Esophagus

## 2017-07-26 MED ORDER — HEPARIN SODIUM (PORCINE) 1000 UNIT/ML IJ SOLN
INTRAMUSCULAR | Status: DC | PRN
  Administered 2017-07-26: 29000 [IU] via INTRAVENOUS

## 2017-07-26 MED ORDER — LACTATED RINGERS IV SOLN: 500.0000 mL | Freq: Once | INTRAVENOUS | Status: DC | PRN

## 2017-07-26 MED ORDER — ROCURONIUM BROMIDE 10 MG/ML (PF) SYRINGE
PREFILLED_SYRINGE | INTRAVENOUS | Status: AC
  Filled 2017-07-26: qty 10

## 2017-07-26 MED ORDER — ORAL CARE MOUTH RINSE
15.0000 mL | Freq: Two times a day (BID) | OROMUCOSAL | Status: DC
  Administered 2017-07-26 – 2017-07-29 (×4): 15 mL via OROMUCOSAL

## 2017-07-26 MED ORDER — VANCOMYCIN HCL IN DEXTROSE 1-5 GM/200ML-% IV SOLN
1000.0000 mg | Freq: Once | INTRAVENOUS | Status: AC
  Administered 2017-07-26: 1000 mg via INTRAVENOUS
  Filled 2017-07-26: qty 200

## 2017-07-26 MED ORDER — INSULIN REGULAR BOLUS VIA INFUSION
0.0000 [IU] | Freq: Three times a day (TID) | INTRAVENOUS | Status: DC
  Filled 2017-07-26: qty 10

## 2017-07-26 MED ORDER — PROTAMINE SULFATE 10 MG/ML IV SOLN
INTRAVENOUS | Status: AC
Start: 2017-07-26 — End: ?
  Filled 2017-07-26: qty 5

## 2017-07-26 MED ORDER — CHLORHEXIDINE GLUCONATE 0.12 % MT SOLN
OROMUCOSAL | Status: AC
  Filled 2017-07-26: qty 15

## 2017-07-26 MED ORDER — LACTATED RINGERS IV SOLN
INTRAVENOUS | Status: DC
  Administered 2017-07-26 – 2017-07-27 (×2): via INTRAVENOUS

## 2017-07-26 MED ORDER — SODIUM CHLORIDE 0.9 % IV SOLN: 250.0000 mL | INTRAVENOUS | Status: DC

## 2017-07-26 MED ORDER — ALBUMIN HUMAN 5 % IV SOLN
250.0000 mL | INTRAVENOUS | Status: AC | PRN
  Administered 2017-07-26 – 2017-07-27 (×4): 250 mL via INTRAVENOUS
  Filled 2017-07-26 (×2): qty 250

## 2017-07-26 MED ORDER — NITROGLYCERIN IN D5W 200-5 MCG/ML-% IV SOLN: 0.0000 ug/min | INTRAVENOUS | Status: DC

## 2017-07-26 MED ORDER — MIDAZOLAM HCL 5 MG/ML IJ SOLN
INTRAMUSCULAR | Status: DC | PRN
  Administered 2017-07-26 (×2): 2 mg via INTRAVENOUS
  Administered 2017-07-26: 4 mg via INTRAVENOUS
  Administered 2017-07-26: 2 mg via INTRAVENOUS

## 2017-07-26 MED ORDER — ACETAMINOPHEN 160 MG/5ML PO SOLN: 1000.0000 mg | Freq: Four times a day (QID) | ORAL | Status: DC

## 2017-07-26 MED ORDER — SODIUM CHLORIDE 0.9% FLUSH: 3.0000 mL | INTRAVENOUS | Status: DC | PRN

## 2017-07-26 MED ORDER — CHLORHEXIDINE GLUCONATE 0.12 % MT SOLN
15.0000 mL | Freq: Once | OROMUCOSAL | Status: AC
  Administered 2017-07-26: 15 mL via OROMUCOSAL

## 2017-07-26 MED ORDER — DEXAMETHASONE SODIUM PHOSPHATE 10 MG/ML IJ SOLN
INTRAMUSCULAR | Status: AC
  Filled 2017-07-26: qty 1

## 2017-07-26 MED ORDER — 0.9 % SODIUM CHLORIDE (POUR BTL) OPTIME
TOPICAL | Status: DC | PRN
  Administered 2017-07-26: 4000 mL

## 2017-07-26 MED ORDER — METOPROLOL TARTRATE 12.5 MG HALF TABLET: 12.5000 mg | ORAL_TABLET | Freq: Two times a day (BID) | ORAL | Status: DC

## 2017-07-26 MED ORDER — POTASSIUM CHLORIDE 10 MEQ/50ML IV SOLN
10.0000 meq | Freq: Once | INTRAVENOUS | Status: AC
  Administered 2017-07-26: 10 meq via INTRAVENOUS

## 2017-07-26 MED ORDER — PROPOFOL 10 MG/ML IV BOLUS
INTRAVENOUS | Status: AC
  Filled 2017-07-26: qty 20

## 2017-07-26 MED ORDER — FENTANYL CITRATE (PF) 250 MCG/5ML IJ SOLN
INTRAMUSCULAR | Status: AC
  Filled 2017-07-26: qty 20

## 2017-07-26 MED ORDER — CHLORHEXIDINE GLUCONATE 0.12 % MT SOLN
15.0000 mL | OROMUCOSAL | Status: AC
  Administered 2017-07-26: 15 mL via OROMUCOSAL

## 2017-07-26 MED ORDER — SODIUM CHLORIDE 0.9 % IV SOLN
INTRAVENOUS | Status: DC
  Filled 2017-07-26: qty 1

## 2017-07-26 MED ORDER — POTASSIUM CHLORIDE 10 MEQ/50ML IV SOLN
10.0000 meq | INTRAVENOUS | Status: AC
  Administered 2017-07-26 (×3): 10 meq via INTRAVENOUS
  Filled 2017-07-26: qty 50

## 2017-07-26 MED ORDER — BISACODYL 10 MG RE SUPP
10.0000 mg | Freq: Every day | RECTAL | Status: DC
  Filled 2017-07-26: qty 1

## 2017-07-26 MED ORDER — PROTAMINE SULFATE 10 MG/ML IV SOLN
INTRAVENOUS | Status: DC | PRN
  Administered 2017-07-26: 10 mg via INTRAVENOUS
  Administered 2017-07-26 (×3): 30 mg via INTRAVENOUS
  Administered 2017-07-26 (×2): 50 mg via INTRAVENOUS
  Administered 2017-07-26 (×3): 30 mg via INTRAVENOUS

## 2017-07-26 MED ORDER — PHENYLEPHRINE HCL 10 MG/ML IJ SOLN
INTRAVENOUS | Status: DC | PRN
  Administered 2017-07-26: 25 ug/min via INTRAVENOUS

## 2017-07-26 MED ORDER — HEPARIN SODIUM (PORCINE) 1000 UNIT/ML IJ SOLN
INTRAMUSCULAR | Status: AC
  Filled 2017-07-26: qty 1

## 2017-07-26 MED ORDER — DOCUSATE SODIUM 100 MG PO CAPS
200.0000 mg | ORAL_CAPSULE | Freq: Every day | ORAL | Status: DC
  Administered 2017-07-27 – 2017-08-01 (×6): 200 mg via ORAL
  Filled 2017-07-26 (×6): qty 2

## 2017-07-26 MED ORDER — ASPIRIN EC 325 MG PO TBEC
325.0000 mg | DELAYED_RELEASE_TABLET | Freq: Every day | ORAL | Status: DC
  Administered 2017-07-27 – 2017-08-01 (×6): 325 mg via ORAL
  Filled 2017-07-26 (×6): qty 1

## 2017-07-26 MED ORDER — METOPROLOL TARTRATE 12.5 MG HALF TABLET: 12.5000 mg | ORAL_TABLET | Freq: Once | ORAL | Status: DC

## 2017-07-26 MED ORDER — FENTANYL CITRATE (PF) 250 MCG/5ML IJ SOLN
INTRAMUSCULAR | Status: DC | PRN
  Administered 2017-07-26: 250 ug via INTRAVENOUS
  Administered 2017-07-26: 150 ug via INTRAVENOUS
  Administered 2017-07-26 (×2): 100 ug via INTRAVENOUS
  Administered 2017-07-26 (×2): 150 ug via INTRAVENOUS
  Administered 2017-07-26: 100 ug via INTRAVENOUS

## 2017-07-26 MED ORDER — SODIUM CHLORIDE 0.9% FLUSH: 10.0000 mL | INTRAVENOUS | Status: DC | PRN

## 2017-07-26 MED ORDER — PROTAMINE SULFATE 10 MG/ML IV SOLN
INTRAVENOUS | Status: AC
  Filled 2017-07-26: qty 25

## 2017-07-26 MED ORDER — ACETAMINOPHEN 500 MG PO TABS
1000.0000 mg | ORAL_TABLET | Freq: Four times a day (QID) | ORAL | Status: AC
  Administered 2017-07-27 – 2017-07-31 (×19): 1000 mg via ORAL
  Filled 2017-07-26 (×19): qty 2

## 2017-07-26 MED ORDER — SODIUM CHLORIDE 0.9% FLUSH
3.0000 mL | Freq: Two times a day (BID) | INTRAVENOUS | Status: DC
  Administered 2017-07-27 – 2017-07-30 (×5): 3 mL via INTRAVENOUS

## 2017-07-26 MED ORDER — CHLORHEXIDINE GLUCONATE CLOTH 2 % EX PADS
6.0000 | MEDICATED_PAD | Freq: Every day | CUTANEOUS | Status: DC
  Administered 2017-07-26 – 2017-07-30 (×4): 6 via TOPICAL

## 2017-07-26 MED ORDER — ONDANSETRON HCL 4 MG/2ML IJ SOLN
4.0000 mg | Freq: Four times a day (QID) | INTRAMUSCULAR | Status: DC | PRN
  Administered 2017-07-26 – 2017-07-28 (×5): 4 mg via INTRAVENOUS
  Filled 2017-07-26 (×5): qty 2

## 2017-07-26 MED ORDER — ACETAMINOPHEN 650 MG RE SUPP
650.0000 mg | Freq: Once | RECTAL | Status: AC
  Administered 2017-07-26: 650 mg via RECTAL

## 2017-07-26 MED ORDER — METOPROLOL TARTRATE 25 MG/10 ML ORAL SUSPENSION: 12.5000 mg | Freq: Two times a day (BID) | ORAL | Status: DC

## 2017-07-26 MED ORDER — CHLORHEXIDINE GLUCONATE 4 % EX LIQD: 30.0000 mL | CUTANEOUS | Status: DC

## 2017-07-26 MED ORDER — DEXAMETHASONE SODIUM PHOSPHATE 10 MG/ML IJ SOLN
INTRAMUSCULAR | Status: DC | PRN
  Administered 2017-07-26: 10 mg via INTRAVENOUS

## 2017-07-26 MED ORDER — PROPOFOL 10 MG/ML IV BOLUS
INTRAVENOUS | Status: DC | PRN
  Administered 2017-07-26: 50 mg via INTRAVENOUS

## 2017-07-26 MED ORDER — ASPIRIN 81 MG PO CHEW: 324.0000 mg | CHEWABLE_TABLET | Freq: Every day | ORAL | Status: DC

## 2017-07-26 MED ORDER — SODIUM CHLORIDE 0.9 % IV SOLN
INTRAVENOUS | Status: DC
  Administered 2017-07-26: 14:00:00 via INTRAVENOUS

## 2017-07-26 MED ORDER — LACTATED RINGERS IV SOLN
INTRAVENOUS | Status: DC
  Administered 2017-07-26: 14:00:00 via INTRAVENOUS

## 2017-07-26 MED ORDER — SODIUM CHLORIDE 0.9% FLUSH
10.0000 mL | Freq: Two times a day (BID) | INTRAVENOUS | Status: DC
  Administered 2017-07-26 – 2017-07-27 (×2): 10 mL
  Administered 2017-07-29: 20 mL
  Administered 2017-07-30 (×2): 10 mL

## 2017-07-26 MED ORDER — CHLORHEXIDINE GLUCONATE 0.12 % MT SOLN
OROMUCOSAL | Status: AC
  Administered 2017-07-26: 15 mL via OROMUCOSAL
  Filled 2017-07-26: qty 15

## 2017-07-26 MED ORDER — METOPROLOL TARTRATE 5 MG/5ML IV SOLN: 2.5000 mg | INTRAVENOUS | Status: DC | PRN

## 2017-07-26 MED ORDER — DEXMEDETOMIDINE HCL IN NACL 200 MCG/50ML IV SOLN
0.0000 ug/kg/h | INTRAVENOUS | Status: DC
  Administered 2017-07-26: 0.1 ug/kg/h via INTRAVENOUS

## 2017-07-26 MED ORDER — ROCURONIUM BROMIDE 100 MG/10ML IV SOLN
INTRAVENOUS | Status: DC | PRN
  Administered 2017-07-26: 50 mg via INTRAVENOUS
  Administered 2017-07-26: 20 mg via INTRAVENOUS
  Administered 2017-07-26: 50 mg via INTRAVENOUS
  Administered 2017-07-26: 30 mg via INTRAVENOUS

## 2017-07-26 MED ORDER — SODIUM CHLORIDE 0.45 % IV SOLN
INTRAVENOUS | Status: DC | PRN
  Administered 2017-07-26: 14:00:00 via INTRAVENOUS

## 2017-07-26 MED ORDER — MIDAZOLAM HCL 10 MG/2ML IJ SOLN
INTRAMUSCULAR | Status: AC
Start: 2017-07-26 — End: ?
  Filled 2017-07-26: qty 2

## 2017-07-26 MED ORDER — CHLORHEXIDINE GLUCONATE 0.12% ORAL RINSE (MEDLINE KIT): 15.0000 mL | Freq: Two times a day (BID) | OROMUCOSAL | Status: DC

## 2017-07-26 MED ORDER — FAMOTIDINE IN NACL 20-0.9 MG/50ML-% IV SOLN
20.0000 mg | Freq: Two times a day (BID) | INTRAVENOUS | Status: DC
  Administered 2017-07-26: 20 mg via INTRAVENOUS

## 2017-07-26 MED ORDER — MIDAZOLAM HCL 2 MG/2ML IJ SOLN: 2.0000 mg | INTRAMUSCULAR | Status: DC | PRN

## 2017-07-26 MED ORDER — SODIUM CHLORIDE 0.9 % IV SOLN
0.0000 ug/min | INTRAVENOUS | Status: DC
  Filled 2017-07-26: qty 2

## 2017-07-26 MED ORDER — MORPHINE SULFATE (PF) 2 MG/ML IV SOLN: 1.0000 mg | INTRAVENOUS | Status: DC | PRN

## 2017-07-26 MED ORDER — OXYCODONE HCL 5 MG PO TABS
5.0000 mg | ORAL_TABLET | ORAL | Status: DC | PRN
  Administered 2017-07-26 – 2017-07-27 (×2): 10 mg via ORAL
  Administered 2017-07-27: 5 mg via ORAL
  Administered 2017-07-27: 10 mg via ORAL
  Filled 2017-07-26 (×3): qty 2
  Filled 2017-07-26: qty 1

## 2017-07-26 MED ORDER — ACETAMINOPHEN 160 MG/5ML PO SOLN: 650.0000 mg | Freq: Once | ORAL | Status: AC

## 2017-07-26 MED ORDER — CEFUROXIME SODIUM 1.5 G IV SOLR
1.5000 g | Freq: Two times a day (BID) | INTRAVENOUS | Status: AC
  Administered 2017-07-26 – 2017-07-28 (×4): 1.5 g via INTRAVENOUS
  Filled 2017-07-26 (×4): qty 1.5

## 2017-07-26 MED ORDER — TRAMADOL HCL 50 MG PO TABS
50.0000 mg | ORAL_TABLET | ORAL | Status: DC | PRN
  Administered 2017-07-30: 50 mg via ORAL
  Filled 2017-07-26: qty 2

## 2017-07-26 MED ORDER — LACTATED RINGERS IV SOLN
INTRAVENOUS | Status: DC | PRN
  Administered 2017-07-26 (×4): via INTRAVENOUS

## 2017-07-26 MED ORDER — INSULIN ASPART 100 UNIT/ML ~~LOC~~ SOLN
0.0000 [IU] | SUBCUTANEOUS | Status: DC
  Administered 2017-07-27 – 2017-07-28 (×3): 2 [IU] via SUBCUTANEOUS

## 2017-07-26 MED ORDER — DEXMEDETOMIDINE HCL IN NACL 200 MCG/50ML IV SOLN
INTRAVENOUS | Status: AC
Start: 2017-07-26 — End: ?
  Filled 2017-07-26: qty 50

## 2017-07-26 MED ORDER — ORAL CARE MOUTH RINSE
15.0000 mL | OROMUCOSAL | Status: DC
  Administered 2017-07-26 (×2): 15 mL via OROMUCOSAL

## 2017-07-26 MED ORDER — BISACODYL 5 MG PO TBEC
10.0000 mg | DELAYED_RELEASE_TABLET | Freq: Every day | ORAL | Status: DC
  Administered 2017-07-27 – 2017-08-01 (×5): 10 mg via ORAL
  Filled 2017-07-26 (×6): qty 2

## 2017-07-26 MED ORDER — MORPHINE SULFATE (PF) 2 MG/ML IV SOLN
1.0000 mg | INTRAVENOUS | Status: DC | PRN
  Administered 2017-07-26 – 2017-07-27 (×7): 2 mg via INTRAVENOUS
  Filled 2017-07-26 (×7): qty 1

## 2017-07-26 MED ORDER — MAGNESIUM SULFATE 4 GM/100ML IV SOLN
4.0000 g | Freq: Once | INTRAVENOUS | Status: AC
  Administered 2017-07-26: 4 g via INTRAVENOUS
  Filled 2017-07-26: qty 100

## 2017-07-26 MED ORDER — PANTOPRAZOLE SODIUM 40 MG PO TBEC
40.0000 mg | DELAYED_RELEASE_TABLET | Freq: Every day | ORAL | Status: DC
  Administered 2017-07-28 – 2017-08-01 (×5): 40 mg via ORAL
  Filled 2017-07-26 (×5): qty 1

## 2017-07-26 SURGICAL SUPPLY — 101 items
ADAPTER CARDIO PERF ANTE/RETRO (ADAPTER) ×3 IMPLANT
ADAPTER DLP PERFUSION .25INX2I (MISCELLANEOUS) ×1 IMPLANT
ADH SKN CLS APL DERMABOND .7 (GAUZE/BANDAGES/DRESSINGS) ×2
ADPR CRDPLG .25X.64 STRL (MISCELLANEOUS) ×2
ADPR PRFSN 84XANTGRD RTRGD (ADAPTER) ×2
BAG DECANTER FOR FLEXI CONT (MISCELLANEOUS) ×4 IMPLANT
BATTERY MAXDRIVER (MISCELLANEOUS) ×1 IMPLANT
CANISTER SUCT 3000ML PPV (MISCELLANEOUS) ×3 IMPLANT
CANNULA FEM VENOUS REMOTE 22FR (CANNULA) ×1 IMPLANT
CANNULA GUNDRY RCSP 15FR (MISCELLANEOUS) ×3 IMPLANT
CANNULA OPTISITE PERFUSION 16F (CANNULA) IMPLANT
CANNULA OPTISITE PERFUSION 18F (CANNULA) IMPLANT
CANNULA SUMP PERICARDIAL (CANNULA) ×3 IMPLANT
CATH ENDOVENT PULMONARY (CATHETERS) IMPLANT
CATH HEART VENT LEFT (CATHETERS) ×2 IMPLANT
CELLS DAT CNTRL 66122 CELL SVR (MISCELLANEOUS) ×2 IMPLANT
CONN ST 1/4X3/8  BEN (MISCELLANEOUS) ×4
CONN ST 1/4X3/8 BEN (MISCELLANEOUS) ×4 IMPLANT
CONNECTOR 1/2X3/8X1/2 3 WAY (MISCELLANEOUS) ×1
CONNECTOR 1/2X3/8X1/2 3WAY (MISCELLANEOUS) ×2 IMPLANT
CONT SPEC 4OZ CLIKSEAL STRL BL (MISCELLANEOUS) ×3 IMPLANT
COVER BACK TABLE 24X17X13 BIG (DRAPES) ×3 IMPLANT
COVER MAYO STAND STRL (DRAPES) ×1 IMPLANT
CRADLE DONUT ADULT HEAD (MISCELLANEOUS) ×3 IMPLANT
DERMABOND ADVANCED (GAUZE/BANDAGES/DRESSINGS) ×1
DERMABOND ADVANCED .7 DNX12 (GAUZE/BANDAGES/DRESSINGS) ×4 IMPLANT
DEVICE PMI PUNCTURE CLOSURE (MISCELLANEOUS) ×3 IMPLANT
DEVICE TROCAR PUNCTURE CLOSURE (ENDOMECHANICALS) ×3 IMPLANT
DRAIN CHANNEL 28F RND 3/8 FF (WOUND CARE) ×2 IMPLANT
DRAIN CHANNEL 32F RND 10.7 FF (WOUND CARE) ×6 IMPLANT
DRAPE BILATERAL SPLIT (DRAPES) ×3 IMPLANT
DRAPE CV SPLIT W-CLR ANES SCRN (DRAPES) ×3 IMPLANT
DRAPE INCISE IOBAN 66X45 STRL (DRAPES) ×9 IMPLANT
DRAPE SLUSH/WARMER DISC (DRAPES) ×3 IMPLANT
DRSG AQUACEL AG ADV 3.5X 6 (GAUZE/BANDAGES/DRESSINGS) ×1 IMPLANT
DRSG AQUACEL AG ADV 3.5X14 (GAUZE/BANDAGES/DRESSINGS) ×1 IMPLANT
ELECT BLADE 4.0 EZ CLEAN MEGAD (MISCELLANEOUS) ×3
ELECT BLADE 6.5 EXT (BLADE) ×3 IMPLANT
ELECT REM PT RETURN 9FT ADLT (ELECTROSURGICAL) ×6
ELECTRODE BLDE 4.0 EZ CLN MEGD (MISCELLANEOUS) ×2 IMPLANT
ELECTRODE REM PT RTRN 9FT ADLT (ELECTROSURGICAL) ×4 IMPLANT
FELT TEFLON 1X6 (MISCELLANEOUS) ×6 IMPLANT
FEMORAL VENOUS CANN RAP (CANNULA) IMPLANT
GAUZE SPONGE 4X4 12PLY STRL (GAUZE/BANDAGES/DRESSINGS) ×3 IMPLANT
GLOVE BIO SURGEON STRL SZ 6.5 (GLOVE) ×5 IMPLANT
GLOVE ECLIPSE 8.0 STRL XLNG CF (GLOVE) ×2 IMPLANT
GLOVE ORTHO TXT STRL SZ7.5 (GLOVE) ×9 IMPLANT
GOWN STRL REUS W/ TWL LRG LVL3 (GOWN DISPOSABLE) ×8 IMPLANT
GOWN STRL REUS W/TWL LRG LVL3 (GOWN DISPOSABLE) ×18
KIT BASIN OR (CUSTOM PROCEDURE TRAY) ×3 IMPLANT
KIT CATH SUCT 8FR (CATHETERS) ×3 IMPLANT
KIT DILATOR VASC 18G NDL (KITS) ×3 IMPLANT
KIT SUCTION CATH 14FR (SUCTIONS) ×3 IMPLANT
KIT SUT CK MINI COMBO 4X17 (Prosthesis & Implant Heart) ×1 IMPLANT
KIT TURNOVER KIT B (KITS) ×3 IMPLANT
LEAD PACING MYOCARDI (MISCELLANEOUS) ×3 IMPLANT
LINE VENT (MISCELLANEOUS) ×1 IMPLANT
NDL AORTIC ROOT 14G 7F (CATHETERS) ×2 IMPLANT
NEEDLE AORTIC ROOT 14G 7F (CATHETERS) ×3 IMPLANT
NS IRRIG 1000ML POUR BTL (IV SOLUTION) ×15 IMPLANT
PACK OPEN HEART (CUSTOM PROCEDURE TRAY) ×3 IMPLANT
PAD ARMBOARD 7.5X6 YLW CONV (MISCELLANEOUS) ×6 IMPLANT
PAD ELECT DEFIB RADIOL ZOLL (MISCELLANEOUS) ×3 IMPLANT
PLATE RIB LOCKING UNI 14HOLE (Plate) ×1 IMPLANT
RETRACTOR WND ALEXIS 18 MED (MISCELLANEOUS) ×2 IMPLANT
RTRCTR WOUND ALEXIS 18CM MED (MISCELLANEOUS) ×3
SCREW LOCKING TI 2.3X13MM (Screw) ×4 IMPLANT
SCREW STERNAL 2.3X17MM (Screw) ×3 IMPLANT
SET CANNULATION TOURNIQUET (MISCELLANEOUS) ×3 IMPLANT
SET CARDIOPLEGIA MPS 5001102 (MISCELLANEOUS) ×1 IMPLANT
SET IRRIG TUBING LAPAROSCOPIC (IRRIGATION / IRRIGATOR) ×3 IMPLANT
SOLUTION ANTI FOG 6CC (MISCELLANEOUS) ×3 IMPLANT
SUT BONE WAX W31G (SUTURE) ×3 IMPLANT
SUT ETHIBOND 2 0 SH (SUTURE) ×18
SUT ETHIBOND 2 0 SH 36X2 (SUTURE) IMPLANT
SUT GORETEX CV 4 TH 22 36 (SUTURE) ×3 IMPLANT
SUT GORETEX CV4 TH-18 (SUTURE) ×6 IMPLANT
SUT PROLENE 3 0 SH1 36 (SUTURE) ×6 IMPLANT
SUT PROLENE 4 0 RB 1 (SUTURE) ×24
SUT PROLENE 4-0 RB1 .5 CRCL 36 (SUTURE) IMPLANT
SUT SILK  1 MH (SUTURE) ×1
SUT SILK 1 MH (SUTURE) IMPLANT
SUT SILK 1 TIES 10X30 (SUTURE) ×1 IMPLANT
SUT SILK 2 0 TIES 10X30 (SUTURE) ×1 IMPLANT
SUT SILK 4 0 TIE 10X30 (SUTURE) ×1 IMPLANT
SUT TEM PAC WIRE 2 0 SH (SUTURE) ×6 IMPLANT
SUT VIC AB 3-0 SH 8-18 (SUTURE) ×1 IMPLANT
SYSTEM SAHARA CHEST DRAIN ATS (WOUND CARE) ×3 IMPLANT
TAPE CLOTH SURG 4X10 WHT LF (GAUZE/BANDAGES/DRESSINGS) ×1 IMPLANT
TAPE PAPER MEDFIX 1IN X 10YD (GAUZE/BANDAGES/DRESSINGS) ×1 IMPLANT
TOWEL GREEN STERILE (TOWEL DISPOSABLE) ×3 IMPLANT
TOWEL GREEN STERILE FF (TOWEL DISPOSABLE) ×3 IMPLANT
TRAY FOLEY SLVR 16FR TEMP STAT (SET/KITS/TRAYS/PACK) ×3 IMPLANT
TROCAR BLADELESS 11MM (ENDOMECHANICALS) ×2 IMPLANT
TROCAR XCEL BLADELESS 5X75MML (TROCAR) IMPLANT
TROCAR XCEL NON-BLD 11X100MML (ENDOMECHANICALS) ×6 IMPLANT
TUBE SUCT INTRACARD DLP 20F (MISCELLANEOUS) ×3 IMPLANT
UNDERPAD 30X30 (UNDERPADS AND DIAPERS) ×3 IMPLANT
VALVE SYSTEM EDWS INTUITY 23A (Prosthesis & Implant Heart) ×1 IMPLANT
VENT LEFT HEART 12002 (CATHETERS) ×6
WATER STERILE IRR 1000ML POUR (IV SOLUTION) ×6 IMPLANT

## 2017-07-26 NOTE — Transfer of Care (Signed)
Immediate Anesthesia Transfer of Care Note  Patient: Miranda Berger  Procedure(s) Performed: MINIMALLY INVASIVE AORTIC VALVE REPLACEMENT (AVR) (N/A Chest) TRANSESOPHAGEAL ECHOCARDIOGRAM (TEE) (N/A Esophagus)  Patient Location: ICU  Anesthesia Type:General  Level of Consciousness: sedated and unresponsive  Airway & Oxygen Therapy: Patient remains intubated per anesthesia plan and Patient placed on Ventilator (see vital sign flow sheet for setting)  Post-op Assessment: Report given to RN and Post -op Vital signs reviewed and stable  Post vital signs: Reviewed and stable  Last Vitals:  Vitals Value Taken Time  BP    Temp    Pulse 91 07/26/2017  1:28 PM  Resp 0 07/26/2017  1:28 PM  SpO2 100 % 07/26/2017  1:28 PM  Vitals shown include unvalidated device data.  Last Pain:  Vitals:   07/26/17 0605  TempSrc: Oral  PainSc:          Complications: No apparent anesthesia complications

## 2017-07-26 NOTE — Anesthesia Procedure Notes (Addendum)
Procedure Name: Intubation Date/Time: 07/26/2017 8:10 AM Performed by: Marena ChancyBeckner, Albirta Rhinehart S, CRNA Pre-anesthesia Checklist: Patient identified, Emergency Drugs available, Suction available and Patient being monitored Patient Re-evaluated:Patient Re-evaluated prior to induction Oxygen Delivery Method: Circle System Utilized Preoxygenation: Pre-oxygenation with 100% oxygen Induction Type: IV induction Ventilation: Mask ventilation without difficulty Laryngoscope Size: 3 and Glidescope Grade View: Grade II Tube type: Oral Tube size: 8.0 mm Number of attempts: 3 Airway Equipment and Method: Stylet and Oral airway Placement Confirmation: ETT inserted through vocal cords under direct vision,  positive ETCO2 and breath sounds checked- equal and bilateral Tube secured with: Tape Dental Injury: Teeth and Oropharynx as per pre-operative assessment  Difficulty Due To: Difficulty was unanticipated, Difficult Airway- due to anterior larynx, Difficult Airway- due to limited oral opening and Difficult Airway- due to dentition Future Recommendations: Recommend- induction with short-acting agent, and alternative techniques readily available

## 2017-07-26 NOTE — Progress Notes (Addendum)
Informed CRNA that we need ABG's  Supplies left in the room

## 2017-07-26 NOTE — Anesthesia Procedure Notes (Signed)
Central Venous Catheter Insertion Performed by: Beryle LatheBrock, Teauna Dubach E, MD, anesthesiologist Start/End6/05/2017 6:41 AM, 07/26/2017 6:53 AM Patient location: Pre-op. Preanesthetic checklist: patient identified, IV checked, risks and benefits discussed, surgical consent, monitors and equipment checked, pre-op evaluation, timeout performed and anesthesia consent Position: Trendelenburg Lidocaine 1% used for infiltration and patient sedated Hand hygiene performed , maximum sterile barriers used  and Seldinger technique used Catheter size: 8.5 Fr Total catheter length 10. Central line was placed.Sheath introducer Swan type:thermodilution PA Cath depth:50 Procedure performed using ultrasound guided technique. Ultrasound Notes:anatomy identified, needle tip was noted to be adjacent to the nerve/plexus identified, no ultrasound evidence of intravascular and/or intraneural injection and image(s) printed for medical record Attempts: 2 Following insertion, line sutured and dressing applied. Post procedure assessment: blood return through all ports, free fluid flow and no air  Patient tolerated the procedure well with no immediate complications.

## 2017-07-26 NOTE — Progress Notes (Signed)
RT note- weaning started at this time.

## 2017-07-26 NOTE — OR Nursing (Signed)
12:30 - 45 minute call to SICU charge nurse 

## 2017-07-26 NOTE — Brief Op Note (Signed)
07/26/2017  12:52 PM  PATIENT:  Miranda Berger  62 y.o. female  PRE-OPERATIVE DIAGNOSIS:  Severe AS  POST-OPERATIVE DIAGNOSIS:  Severe AS  PROCEDURE:  TRANSESOPHAGEAL ECHOCARDIOGRAM (TEE),  MINIMALLY INVASIVE AORTIC VALVE REPLACEMENT (AVR) (using an [employer] Elite, Serial # [id number],Model# 8300 AB, size 23)  SURGEON:  Surgeon(s) and Role:    Purcell Nailswen, Clarence H, MD - Primary  PHYSICIAN ASSISTANT: Doree Fudgeonielle Zimmerman PA-C  ASSISTANTS: Benay SpiceMarie Irwin RNFA  ANESTHESIA:   general  EBL:  480 mL   DRAINS: Chest tubes placed in the mediastinal and pleural spaces   SPECIMEN:  Source of Specimen:  Native aortic valve leaflets  DISPOSITION OF SPECIMEN:  PATHOLOGY  COUNTS CORRECT:  YES  DICTATION: .Dragon Dictation  PLAN OF CARE: Admit to inpatient   PATIENT DISPOSITION:  ICU - intubated and hemodynamically stable.   Delay start of Pharmacological VTE agent (>24hrs) due to surgical blood loss or risk of bleeding: yes  BASELINE WEIGHT: 80.7 kg

## 2017-07-26 NOTE — Progress Notes (Signed)
[facility] BRIEF SICU PROGRESS NOTE  Day of Surgery  S/P Procedure(s) (LRB): MINIMALLY INVASIVE AORTIC VALVE REPLACEMENT (AVR) (N/A) TRANSESOPHAGEAL ECHOCARDIOGRAM (TEE) (N/A)   Starting to wake up on vent NSR w/ stable hemodynamics on low dose Neo for BP support O2 sats 100% Chest tube output low UOP adequate Labs okay  Plan: Continue routine early postop  Purcell Nailslarence H Kyira Volkert, MD 07/26/2017 5:13 PM

## 2017-07-26 NOTE — Interval H&P Note (Signed)
History and Physical Interval Note:  07/26/2017 6:55 AM  Assunta Gamblesita S Augsburger  has presented today for surgery, with the diagnosis of AS  The various methods of treatment have been discussed with the patient and family. After consideration of risks, benefits and other options for treatment, the patient has consented to  Procedure(s): MINIMALLY INVASIVE AORTIC VALVE REPLACEMENT (AVR) (N/A) TRANSESOPHAGEAL ECHOCARDIOGRAM (TEE) (N/A) as a surgical intervention .  The patient's history has been reviewed, patient examined, no change in status, stable for surgery.  I have reviewed the patient's chart and labs.  Questions were answered to the patient's satisfaction.     Purcell Nailslarence H Owen

## 2017-07-26 NOTE — Progress Notes (Signed)
Anesthesia airway note:  The patient underwent uneventful anesthetic induction. However intubation was difficult due to small mouth, prominent teeth, and an anterior larynx. The larynx could be visualized with a Miller 2 blade and with the glide scope.   Placement of a 16F double lumen endotracheal tube was unsuccessful after 2 attempts. The first attempt  was made using the [facility] blade and the second attempt was made with a [facility] airway exchange catheter.   The decision was then made to place a single-lumen endotracheal tube and proceed with a lung isolation using a bronchial blocker. An 8.0 endotracheal tube was inserted using the glidescope. The bronchial blocker was inserted into the right mainstem bronchus without difficulty and the cuff was inflated just proximal to the right upper lobe bronchus takeoff.  The O2 Sat remained > 90% throughout all intubation attempts.  The bronchial blocker was removed at the end of the surgery  Kipp Broodavid Adrian Specht, MD

## 2017-07-26 NOTE — Progress Notes (Signed)
Placed patient on CPAP via FFM, home settings of12.0 cm H20 with 2 lpm O2 bleed in.  Patient tolerating well at this time. RN aware.

## 2017-07-26 NOTE — Anesthesia Procedure Notes (Signed)
Arterial Line Insertion Start/End6/05/2017 7:07 AM, 07/26/2017 7:16 AM Performed by: Beryle LatheBrock, Michol Emory E, MD, anesthesiologist  Patient location: Pre-op. Preanesthetic checklist: patient identified, IV checked, risks and benefits discussed, surgical consent, monitors and equipment checked, pre-op evaluation, timeout performed and anesthesia consent Lidocaine 1% used for infiltration Right, radial was placed Catheter size: 20 Fr Hand hygiene performed  and maximum sterile barriers used   Attempts: 4 (Several attempts by CRNAs on each side prior to successful attempt by Dr. Mal AmabileBrock with US on right) Procedure performed using ultrasound guided technique. Ultrasound Notes:anatomy identified, needle tip was noted to be adjacent to the nerve/plexus identified, no ultrasound evidence of intravascular and/or intraneural injection and image(s) printed for medical record Following insertion, dressing applied and Biopatch. Post procedure assessment: normal and unchanged  Patient tolerated the procedure well with no immediate complications.

## 2017-07-26 NOTE — Progress Notes (Signed)
RT note- failed wean due to low minute ventilation and apnea at this time, placed back to a rate of 12 for 30 min.

## 2017-07-26 NOTE — Anesthesia Procedure Notes (Signed)
Central Venous Catheter Insertion Performed by: Beryle LatheBrock, Stefan Karen E, MD, anesthesiologist Start/End6/05/2017 6:53 AM, 07/26/2017 6:56 AM Patient location: Pre-op. Preanesthetic checklist: patient identified, IV checked, risks and benefits discussed, surgical consent, monitors and equipment checked, pre-op evaluation, timeout performed and anesthesia consent Hand hygiene performed  and maximum sterile barriers used  Total catheter length 10. PA cath was placed.Swan type:thermodilution PA Cath depth:53 Procedure performed without using ultrasound guided technique. Attempts: 1 Patient tolerated the procedure well with no immediate complications.

## 2017-07-26 NOTE — Op Note (Signed)
CARDIOTHORACIC SURGERY OPERATIVE NOTE  Date of Procedure:  07/26/2017  Preoperative Diagnosis: Severe Aortic Stenosis   Postoperative Diagnosis: Same   Procedure:    Minimally Invasive Aortic Valve Replacement Right Anterior Mini-thoracotomy Edwards Intuity Elite Rapid Deployment Stented Bovine Pericardial Tissue Valve (size 18m, model #8300AB, serial #[id number] Plating of right 2nd costal cartilage (KLS titanium plate)   Surgeon: CValentina Gu ORoxy Manns MD  Assistant: DNani Skillern PA-C  Anesthesia: DBailey Mech Operative Findings:  Bicuspid aortic valve (Sievers Type I) with severe aortic stenosis  Normal LV systolic function            BRIEF CLINICAL NOTE AND INDICATIONS FOR SURGERY  Patient is a 63year old female with history of anxiety and hypertension who has been referred for surgical consultation to discuss treatment options for management of recently discovered bicuspid aortic valve with aortic stenosis.  Patient states that she has been fairly healthy all of her life. She recently underwent routine physical examination by her gynecologist and was noted to have a systolic murmur on physical exam. An echocardiogram was performed June 14, 2017 demonstrating the presence of a functionally bicuspid aortic valve with moderate to severe aortic stenosis. Patient was referred for cardiology consultation and evaluated by Dr. MBettina Gaviaon June 21, 2017 who noted that the echocardiogram Doppler parameters were consistent with moderate aortic stenosis but the patient had low stroke volume index and significant symptoms of atypical chest pain with exertional shortness of breath. Paradoxical low flow normal ejection fraction severe aortic stenosis was suspected and a CT coronary artery valve calcium score was ordered. CT confirmed the presence of heavy aortic valve calcification and the patient underwent diagnostic cardiac catheterization by Dr. CBurt Knackon Jul 08, 2017. The  patient was found to have normal coronary arteries with no significant coronary artery disease. Mean transvalvular gradient across aortic valve was measured 20 mmHg at catheterization, corresponding to aortic valve area calculated 1.25 cm. Right-sided pressures were normal. The patient was referred for elective surgical consultation.  The patient has been seen in consultation and counseled at length regarding the indications, risks and potential benefits of surgery.  All questions have been answered, and the patient provides full informed consent for the operation as described.     DETAILS OF THE OPERATIVE PROCEDURE  Preparation:  The patient is brought to the operating room on the above mentioned date and central monitoring was established by the anesthesia team including placement of Swan-Ganz catheter through the left internal jugular vein.  A radial arterial line is placed. The patient is placed in the supine position on the operating table.  Intravenous antibiotics are administered. General endotracheal anesthesia is induced uneventfully. The patient is initially intubated using a dual lumen endotracheal tube.  A Foley catheter is placed.  Baseline transesophageal echocardiogram was performed.  Findings were notable for the presence of bicuspid aortic valve with severe aortic stenosis.  There was normal left ventricular systolic function.  No other significant abnormalities were noted.  The patient is placed in the supine position with their neck gently extended and turned to the left.   The patient's right neck, chest, abdomen, both groins, and both lower extremities are prepared and draped in a sterile manner. A time out procedure is performed.   Surgical Approach:  A right miniature anteriorthoracotomy incision is performed. The incision is placed immediately over the 3rd intercostal space.  The muscle fibers of the pectoralis major muscle are split longitudinally and the right pleural  space is entered  through the 3rd intercostal space. The 2nd costal cartilage is divided at it's insertion onto the sternum.  The right internal mammary artery and veins are ligated and divided.  A soft tissue retractor is placed.  Two 11 mm ports are placed through separate stab incisions inferiorly. The right pleural space is insufflated continuously with carbon dioxide gas through the posterior port during the remainder of the operation.     Extracorporeal Cardiopulmonary Bypass and Myocardial Protection:  A small incision is made in the right inguinal crease and the anterior surface of the right common femoral artery and right common femoral vein are identified.  The patient is placed in Trendelenburg position. The right internal jugular vein is cannulated with Seldinger technique and a guidewire advanced into the right atrium. The patient is heparinized systemically.  A 14 French pediatric femoral venous cannula is placed through the right internal jugular vein into the superior vena cava.  Pursestring sutures are placed on the anterior surface of the right common femoral vein and right common femoral artery. The right common femoral vein is cannulated with the Seldinger technique and a guidewire is advanced under transesophageal echocardiogram guidance through the right atrium. The femoral vein is cannulated with a long 22 French femoral venous cannula. The right common femoral artery is cannulated with Seldinger technique and a flexible guidewire is advanced until it can be appreciated intraluminally in the descending thoracic aorta on transesophageal echocardiogram. The femoral artery is cannulated with an 18 French femoral arterial cannula.  Adequate heparinization is verified.      The entire pre-bypass portion of the operation was notable for stable hemodynamics.  Cardiopulmonary bypass was begun.  Vacuum assist venous drainage is utilized. An incision is made in the pericardium over the ascending  aorta and extended in both directions. Silk traction sutures are placed to retract the pericardium.  Venous drainage and exposure are notably excellent. Attempts to place a retrograde cardioplegia cannula through the right atrium into the coronary sinus using transesophageal echocardiogram guidance are unsuccessful due to the presence of a large eustachian valve.  A left ventricular vent is placed through the right superior pulmonary vein.  An antegrade cardioplegia cannula is placed in the ascending aorta.    The patient is cooled to 28C systemic temperature.  The aortic cross clamp is applied and cardioplegia is delivered in an antegrade fashion through the aortic root using modified [place] cold blood cardioplegia ([place] blood cardioplegia protocol).   The initial cardioplegic arrest is rapid with early diastolic arrest.   Myocardial protection was felt to be excellent.    Aortic Valve Replacement:  An oblique transverse aortotomy incision was performed.  The aortic valve was inspected and notable for severe aortic stenosis.  The aortic valve was functionally bicuspid with a single raphae between the left and right leaflets.  The aortic valve leaflets were excised sharply and the aortic annulus decalcified.  Decalcification was notably straightforward.  The aortic annulus was sized to accept a 23 mm prosthesis.  The aortic root and left ventricle were irrigated with copious cold saline solution.  Aortic valve replacement was performed using an [employer] rapid deployment pericardial tissue valve (size 23 mm, model #8300AB, serial # [id number]).  The valve was rinsed in saline for manufacture recommendations. A total of 4 individual guiding sutures are placed through the aortic annulus, each at the corresponding nadir of each sinus of Valsalva with 2 sutures along the non-coronary sinus.  The valve was attached to the  delivery device and the 4 guiding sutures placed through the valve  sewing cuff. The valve is lowered into place. Care is taken to insure that the valve is completely seated in the annulus. Each of the guide sutures are secured using Cor knot clips.  The valve stent is deployed by inflating the deployment balloon to 4.5 atm pressure and holding for 10 seconds. The balloon is deflated the valve holder sutures cut In the deployment system removed.   The valve is carefully inspected to make sure that it is seated appropriately. Endoscopic visualization through the valve is utilized to inspect the left ventricular outflow tract. Aortic root was filled with saline to make sure the valve is competent. Rewarming is begun.   Procedure Completion:  The aortotomy was closed using a 2-layer closure of running 4-0 Prolene suture.  One final dose of warm "reanimation dose" cardioplegia was administered through the aortic root.  The aortic cross clamp was removed after a total cross clamp time of 63 minutes.  Epicardial pacing wires are fixed to the right ventricular outflow tract. The patient is rewarmed to 37C temperature.  The pericardial sac was drained using a 28 French Bard drain placed through the anterior port incision.   The patient is weaned and disconnected from cardiopulmonary bypass.  The patient's rhythm at separation from bypass was sinus.  The patient was weaned from bypass without any inotropic support. Total cardiopulmonary bypass time for the operation was 113 minutes.  Followup transesophageal echocardiogram performed after separation from bypass revealed a well-seated aortic valve prosthesis that was functioning normally and without any sign of perivalvular leak.  Deep transgastric views are obtained from multiple angles to make certain the valve is well-seated.  Mean transvalvular gradient across the valve is estimated 4 mmHg.  Left ventricular function was unchanged from preoperatively.  The femoral arterial and venous cannulae were removed uneventfully. There  was a palpable pulse in the distal right common femoral artery after removal of the cannula.  Protamine was administered to reverse the anticoagulation.   The right internal jugular venous cannula is removed and manual pressure held for 15 minutes.  Single lung ventilation was begun. The aortotomy closure was inspected for hemostasis. The right pleural space is irrigated with saline solution and inspected for hemostasis. The right pleural space was drained using a 28 French Bard drain placed through the posterior port incision. The 2nd costal cartilage is replaced using a small universal KLS titanium plate.  The miniature thoracotomy incision was closed in multiple layers in routine fashion. The right groin incision was inspected for hemostasis and closed in multiple layers in routine fashion.   The post-bypass portion of the operation was notable for stable rhythm and hemodynamics.   No blood products were administered during the operation.   Disposition:  The patient tolerated the procedure well.  The patient was reintubated using a single lumen endotracheal tube and subsequently transported to the surgical intensive care unit in stable condition. There were no intraoperative complications. All sponge instrument and needle counts are verified correct at completion of the operation.     Valentina Gu. Roxy Manns MD 07/26/2017 12:54 PM

## 2017-07-26 NOTE — Progress Notes (Signed)
20 minute call placed to SICU charge nurse. Assigned room 10 in 2H.

## 2017-07-26 NOTE — Progress Notes (Signed)
RT note-Attempt to wean again, tolerating at this time, continue to monitor.

## 2017-07-27 ENCOUNTER — Inpatient Hospital Stay (HOSPITAL_COMMUNITY): Payer: BC Managed Care – PPO

## 2017-07-27 ENCOUNTER — Encounter (HOSPITAL_COMMUNITY): Payer: Self-pay | Admitting: Thoracic Surgery (Cardiothoracic Vascular Surgery)

## 2017-07-27 DIAGNOSIS — F419 Anxiety disorder, unspecified: Secondary | ICD-10-CM | POA: Diagnosis present

## 2017-07-27 DIAGNOSIS — J45909 Unspecified asthma, uncomplicated: Secondary | ICD-10-CM | POA: Diagnosis present

## 2017-07-27 DIAGNOSIS — G4733 Obstructive sleep apnea (adult) (pediatric): Secondary | ICD-10-CM | POA: Diagnosis present

## 2017-07-27 LAB — CBC
HEMATOCRIT: 24.5 % — AB (ref 36.0–46.0)
HEMATOCRIT: 25.1 % — AB (ref 36.0–46.0)
HEMOGLOBIN: 8.2 g/dL — AB (ref 12.0–15.0)
Hemoglobin: 7.9 g/dL — ABNORMAL LOW (ref 12.0–15.0)
MCH: 29.9 pg (ref 26.0–34.0)
MCH: 30.3 pg (ref 26.0–34.0)
MCHC: 32.2 g/dL (ref 30.0–36.0)
MCHC: 32.7 g/dL (ref 30.0–36.0)
MCV: 92.8 fL (ref 78.0–100.0)
MCV: 92.6 fL (ref 78.0–100.0)
PLATELETS: 117 10*3/uL — AB (ref 150–400)
Platelets: 156 10*3/uL (ref 150–400)
RBC: 2.64 MIL/uL — AB (ref 3.87–5.11)
RBC: 2.71 MIL/uL — AB (ref 3.87–5.11)
RDW: 12.9 % (ref 11.5–15.5)
RDW: 13.2 % (ref 11.5–15.5)
WBC: 11.5 10*3/uL — ABNORMAL HIGH (ref 4.0–10.5)
WBC: 18.3 10*3/uL — AB (ref 4.0–10.5)

## 2017-07-27 LAB — POCT I-STAT 3, ART BLOOD GAS (G3+)
ACID-BASE DEFICIT: 4 mmol/L — AB (ref 0.0–2.0)
Bicarbonate: 22.3 mmol/L (ref 20.0–28.0)
O2 SAT: 97 %
TCO2: 24 mmol/L (ref 22–32)
pCO2 arterial: 42.7 mmHg (ref 32.0–48.0)
pH, Arterial: 7.324 — ABNORMAL LOW (ref 7.350–7.450)
pO2, Arterial: 98 mmHg (ref 83.0–108.0)

## 2017-07-27 LAB — GLUCOSE, CAPILLARY
GLUCOSE-CAPILLARY: 106 mg/dL — AB (ref 65–99)
GLUCOSE-CAPILLARY: 115 mg/dL — AB (ref 65–99)
GLUCOSE-CAPILLARY: 130 mg/dL — AB (ref 65–99)
GLUCOSE-CAPILLARY: 138 mg/dL — AB (ref 65–99)
Glucose-Capillary: 96 mg/dL (ref 65–99)
Glucose-Capillary: 108 mg/dL — ABNORMAL HIGH (ref 65–99)
Glucose-Capillary: 123 mg/dL — ABNORMAL HIGH (ref 65–99)

## 2017-07-27 LAB — MAGNESIUM
MAGNESIUM: 2.2 mg/dL (ref 1.7–2.4)
Magnesium: 2.2 mg/dL (ref 1.7–2.4)

## 2017-07-27 LAB — BASIC METABOLIC PANEL
ANION GAP: 3 — AB (ref 5–15)
BUN: 13 mg/dL (ref 6–20)
CO2: 26 mmol/L (ref 22–32)
Calcium: 7.7 mg/dL — ABNORMAL LOW (ref 8.9–10.3)
Chloride: 109 mmol/L (ref 101–111)
Creatinine, Ser: 0.73 mg/dL (ref 0.44–1.00)
GFR calc Af Amer: >60 mL/min (ref >60)
Glucose, Bld: 122 mg/dL — ABNORMAL HIGH (ref 65–99)
POTASSIUM: 4.9 mmol/L (ref 3.5–5.1)
Sodium: 138 mmol/L (ref 135–145)

## 2017-07-27 LAB — POCT I-STAT, CHEM 8
BUN: 14 mg/dL (ref 6–20)
CALCIUM ION: 1.2 mmol/L (ref 1.15–1.40)
Chloride: 102 mmol/L (ref 101–111)
Creatinine, Ser: 0.7 mg/dL (ref 0.44–1.00)
Glucose, Bld: 117 mg/dL — ABNORMAL HIGH (ref 65–99)
HCT: 23 % — ABNORMAL LOW (ref 36.0–46.0)
Hemoglobin: 7.8 g/dL — ABNORMAL LOW (ref 12.0–15.0)
Potassium: 3.9 mmol/L (ref 3.5–5.1)
SODIUM: 139 mmol/L (ref 135–145)
TCO2: 23 mmol/L (ref 22–32)

## 2017-07-27 LAB — CREATININE, SERUM: CREATININE: 0.78 mg/dL (ref 0.44–1.00)

## 2017-07-27 MED ORDER — KETOROLAC TROMETHAMINE 15 MG/ML IJ SOLN
15.0000 mg | Freq: Four times a day (QID) | INTRAMUSCULAR | Status: AC
  Administered 2017-07-27 – 2017-07-28 (×5): 15 mg via INTRAVENOUS
  Filled 2017-07-27 (×5): qty 1

## 2017-07-27 MED ORDER — SODIUM CHLORIDE 0.9 % IV SOLN
0.0000 ug/min | INTRAVENOUS | Status: DC
  Administered 2017-07-27: 65.067 ug/min via INTRAVENOUS
  Administered 2017-07-27: 40 ug/min via INTRAVENOUS
  Administered 2017-07-27: 35 ug/min via INTRAVENOUS
  Administered 2017-07-28: 45 ug/min via INTRAVENOUS
  Filled 2017-07-27 (×2): qty 20

## 2017-07-27 MED ORDER — MOMETASONE FURO-FORMOTEROL FUM 200-5 MCG/ACT IN AERO
2.0000 | INHALATION_SPRAY | Freq: Two times a day (BID) | RESPIRATORY_TRACT | Status: DC
  Administered 2017-07-27 – 2017-08-01 (×10): 2 via RESPIRATORY_TRACT
  Filled 2017-07-27 (×2): qty 8.8

## 2017-07-27 MED ORDER — BUPROPION HCL ER (XL) 150 MG PO TB24
150.0000 mg | ORAL_TABLET | Freq: Every day | ORAL | Status: DC
  Administered 2017-07-27 – 2017-07-31 (×5): 150 mg via ORAL
  Filled 2017-07-27 (×6): qty 1

## 2017-07-27 MED ORDER — PNEUMOCOCCAL VAC POLYVALENT 25 MCG/0.5ML IJ INJ: 0.5000 mL | INJECTION | INTRAMUSCULAR | Status: DC | PRN

## 2017-07-27 MED ORDER — ALPRAZOLAM 0.25 MG PO TABS
0.2500 mg | ORAL_TABLET | Freq: Two times a day (BID) | ORAL | Status: DC
  Administered 2017-07-27 – 2017-08-01 (×11): 0.25 mg via ORAL
  Filled 2017-07-27 (×11): qty 1

## 2017-07-27 MED ORDER — PHENYLEPHRINE HCL-NACL 10-0.9 MG/250ML-% IV SOLN
0.0000 ug/min | INTRAVENOUS | Status: DC
  Filled 2017-07-27: qty 250

## 2017-07-27 NOTE — Discharge Summary (Signed)
Physician Discharge Summary       [address]       [place] [id number]             [phone]    Patient ID: Miranda Berger MRN: [id number] DOB/AGE: 62/05/1955 62 y.o.  Admit date: 07/26/2017 Discharge date: 08/01/2017  Admission Diagnoses: Severe aortic stenosis  Discharge Diagnoses:  1. 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 History of Hypertension 3. History of Obstructive sleep apnea 4. History of anxiety 5. History of anemia 6. History of GERD (gastroesophageal reflux disease) 7. History of  PONV (postoperative nausea and vomiting) 8. History of asthma 9. History of Hepatitis A (as a child?)  Procedure (s):   Minimally Invasive Aortic Valve Replacement Right Anterior Mini-thoracotomy Edwards Intuity Elite Rapid Deployment Stented Bovine Pericardial Tissue Valve (size 23mm, model #8300AB, serial [id number]) Plating of right 2nd costal cartilage (KLS titanium plate) by Dr. Cornelius Moraswen on 07/26/2017.   History of Presenting Illness: Patient is a 62 year old female with history of anxiety and hypertension who has been referred for surgical consultation to discuss treatment options for management of recently discovered bicuspid aortic valve with aortic stenosis.  Patient states that she has been fairly healthy all of her life. She recently underwent routine physical examination by her gynecologist and was noted to have a systolic murmur on physical exam. An echocardiogram was performed June 14, 2017 demonstrating the presence of a functionally bicuspid aortic valve with moderate to severe aortic stenosis. Patient was referred for cardiology consultation and evaluated by Dr. Dulce SellarMunley on June 21, 2017 who noted that the echocardiogram Doppler parameters were consistent with moderate aortic stenosis but the patient had low stroke volume index and significant symptoms of atypical chest pain with exertional shortness of breath.  Paradoxical low flow normal ejection fraction severe aortic stenosis was suspected and a CT coronary artery valve calcium score was ordered. CT confirmed the presence of heavy aortic valve calcification and the patient underwent diagnostic cardiac catheterization by Dr. Excell Seltzerooper on Jul 08, 2017. The patient was found to have normal coronary arteries with no significant coronary artery disease. Mean transvalvular gradient across aortic valve was measured 20 mmHg at catheterization, corresponding to aortic valve area calculated 1.25 cm. Right-sided pressures were normal. The patient was referred for elective surgical consultation.  The patient is married and [place]. The patient is a retired Chartered loss adjusterschoolteacher. She has remained reasonably active physically although she has had problems with arthritis in both knees which limits her physical activity to some degree. She describes a gradual progression over the last several months of decreased energy, increased fatigue, and exertional shortness of breath. She has had some chest pain or chest tightness intermittently which does not seem to be related to physical activity. She has had some dizzy spells and palpitations. She denies any exertional chest pain. She denies any PND, orthopnea, or lower extremity edema. She has not had syncope.  Patient returns the office today for follow-up of severe symptomatic aortic stenosis with tentative plan to proceed with elective aortic valve replacement next week. She was originally seen in consultation on Jul 14, 2017. She reports no new problems or complaints although she does admit that she is somewhat anxious about her upcoming surgery. The patientand her husband were againcounseled at length regarding treatment alternatives for management of severe aortic stenosis including continued medical therapy versus proceeding with aortic valve replacement in the near future. The natural  history of aortic stenosis was reviewed, as was long term prognosis with medical therapy alone. Other alternatives including rapid-deployment bioprosthetic tissue valve replacement, transcatheter aortic valve replacement, patch enlargement of the aortic root, stentless porcine aortic root replacement, valve repair, the Ross autograft procedure, and homograft aortic root replacement were discussed. Discussion was held comparing the relative risks of mechanical valve replacement with need for lifelong anticoagulation versus use of a bioprosthetic tissue valve and the associated potential for late structural valve deterioration and failure. This discussion was placed in the context of the patient's particular circumstances, and as a result the patient specifically requests that their valve be replaced using a bioprosthetic tissue valve. The potential advantages and disadvantages associated with use of a rapid-deployment bioprosthetic aortic valve were discussed. Potential risks, benefits, and complications were discussed with the patient and she agreed to proceed with surgery. She underwent a minimally invasive AVR on 07/26/2017.   Brief Hospital Course:  The patient was extubated the evening of surgery without difficulty. She remained afebrile and hemodynamically stable. On EKG, she had a new LBBB. She was weaned off Neo Synephrine drip. Theone Murdoch, a line, and foley were removed early in the post operative course. Chest tubes remained for a few days and were then removed on 07/28/2017.[name]  She was volume over loaded and diuresed. She had ABL anemia. She did require a post op transfusion on 06/06. She was started on Foltx. Last H and H was up to 11.6 and 35.1. She was weaned off the insulin drip.  The patient's HGA1C pre op was 5.5. She was stated on low dose Atenolol on 06/07 as she has ST. She continued to have intermittent heart rates into the 110's. Because of her new LBBB post op, Atenolol was titrated  slowly. As of 06/10 she is on her pre op dose of 50 mg daily (as discussed with Dr. Donata Clay). The patient was felt surgically stable for transfer from the ICU to [facility] for further convalescence on 07/29/2017. She had complaints of throat irritation and some hoarseness. She was given Cepacol lozenges and Chloraseptic,which helped the soreness. She was still slightly hoarse, however. She continues to progress with cardiac rehab. She was ambulating on room air. She has been tolerating a diet and has had a bowel movement. Epicardial pacing wires were previously removed. Chest tube sutures will be removed in the office after discharge. The patient is felt surgically stable for discharge today.   Latest Vital Signs: Blood pressure 113/80, pulse (!) 130, temperature 98.4 F (36.9 C), temperature source Oral, resp. rate 19, height 5\' 4"  (1.626 m), weight 171 lb 1.6 oz (77.6 kg), SpO2 91 %.  Physical Exam: Cardiovascular: RRR, no murmurS Pulmonary: Mostly clear bilaterally Abdomen: Soft, non tender, bowel sounds present. Extremities: Trace bilateral lower extremity edema. Wounds: Clean and dry.  No erythema or signs of infection.     Discharge Condition: Stable and discharged to home.  Recent laboratory studies:  Lab Results  Component Value Date   WBC 8.2 07/30/2017   HGB 11.6 (L) 07/30/2017   HCT 35.1 (L) 07/30/2017   MCV 92.4 07/30/2017   PLT 192 07/30/2017   Lab Results  Component Value Date   NA 141 07/30/2017   K 3.8 07/30/2017   CL 104 07/30/2017   CO2 27 07/30/2017   CREATININE 0.86 07/30/2017   GLUCOSE 154 (H) 07/30/2017     Diagnostic Studies: Dg Chest 2 View  Result Date: 07/30/2017 CLINICAL DATA:  Status post aortic valve  replacement. Pleural effusion. EXAM: CHEST - 2 VIEW COMPARISON:  July 29, 2017 FINDINGS: There is again noted a minimal right apical pneumothorax without tension component. There is a small left pleural effusion, stable. There are areas of patchy  atelectatic change in the right mid lung and both lower lobe regions. There is focal consolidation in the medial left base. Heart is upper normal in size with pulmonary vascularity normal. Patient is status post aortic valve replacement. Postoperative change noted at the sternomanubrial junction. No adenopathy evident. IMPRESSION: Persistent minimal right apical pneumothorax without tension component. Areas of patchy atelectasis bilaterally, more on the right than on the left. Consolidation left lower lobe medially. Stable cardiac silhouette. Areas of postoperative change noted. Electronically Signed   By: Bretta Bang III M.D.   On: 07/30/2017 09:37   Ct Cardiac Scoring  Addendum Date: 07/04/2017   ADDENDUM REPORT: 07/04/2017 07:21 EXAM: OVER-READ INTERPRETATION  CT CHEST The following report is an over-read performed by radiologist Dr. Royal Piedra [facility] on 07/04/2017. This over-read does not include interpretation of cardiac or coronary anatomy or pathology. The coronary calcium score interpretation by the cardiologist is attached. COMPARISON:  Chest CT 06/24/2010 FINDINGS: Linear scarring or subsegmental atelectasis noted throughout visualized portions of the left lower lobe and posterior aspect of the left upper lobe. Within the visualized portions of the thorax there are no suspicious appearing pulmonary nodules or masses, there is no acute consolidative airspace disease, no pleural effusions, no pneumothorax and no lymphadenopathy. Visualized portions of the upper abdomen are unremarkable. There are no aggressive appearing lytic or blastic lesions noted in the visualized portions of the skeleton. IMPRESSION: 1. Areas of mild scarring and/or subsegmental atelectasis are noted in the visualized portions of the left lung, new compared to prior study from 2012. Electronically Signed   By: Trudie Reed M.D.   On: 07/04/2017 07:21   Addendum Date: 07/01/2017   ADDENDUM REPORT:  07/01/2017 17:53 ADDENDUM: Aortic valve calcium score is 1377. Tobias Alexander Electronically Signed   By: Tobias Alexander   On: 07/01/2017 17:53   Result Date: 07/04/2017 CLINICAL DATA:  Risk stratification EXAM: Coronary Calcium Score MEDICATIONS: None. TECHNIQUE: The patient was scanned on a [employer]. Axial non-contrast 3 mm slices were carried out through the heart. The data set was analyzed on a dedicated work station and scored using the Agatson method. FINDINGS: Non-cardiac: See separate report from [facility]. Ascending Aorta: Normal size, mild to moderate calcifications in the aortic root. Pericardium: Normal. Coronary arteries: Normal origin. IMPRESSION: Coronary calcium score of 0. This was 0 percentile for age and sex matched control. Electronically Signed: By: Tobias Alexander On: 07/01/2017 15:00   Ct Coronary Morph W/cta Cor W/score W/ca W/cm &/or Wo/cm  Addendum Date: 07/20/2017   ADDENDUM REPORT: 07/20/2017 12:55 CLINICAL DATA:  62 year old female with severe aortic stenosis being evaluated for a minimally invasive AVR. Evaluate aortic valve, aortic root and thoracic aorta. EXAM: Cardiac TAVR CT TECHNIQUE: The patient was scanned on a [employer]. A 120 kV retrospective scan was triggered in the descending thoracic aorta at 111 HU's. Gantry rotation speed was 250 msecs and collimation was .6 mm. No beta blockade or nitro were given. The 3D data set was reconstructed in 5% intervals of the R-R cycle. Systolic and diastolic phases were analyzed on a dedicated work station using MPR, MIP and VRT modes. The patient received 80 cc of contrast. FINDINGS: Aortic Valve: Trileaflet, but functionally bileaflet aortic valve  with co-joined left and right leaflet with moderate calcifications. Aorta: Normal size, no calcifications or atherosclerosis, no dissection. Sinotubular Junction: 27 x 26 mm Ascending Thoracic Aorta: 34 x 33 mm Aortic Arch: 23 x 21 mm Descending  Thoracic Aorta: 22 x 21 mm Sinus of Valsalva Measurements: Non-coronary: 31 mm Right -coronary: 27 mm Left -coronary: 29 mm Virtual Basal Annulus Measurements: Maximum/Minimum Diameter: 28.3 x 22.2 mm Mean Diameter: 24.4 mm Perimeter: 79.2 mm Area: 468 mm2 Coronary Arteries: Normal origin. Left dominance, minimal non-calcified plaque in the proximal LAD. IMPRESSION: 1. Trileaflet, but functionally bileaflet aortic valve with co-joined left and right leaflet with moderate calcifications. 2. Thoracic aorta: normal size, no calcifications or atherosclerosis, no dissection. 3. No thrombus in the left atrial appendage. 4. Coronary Arteries: Normal origin. Left dominance, minimal non-calcified plaque in the proximal LAD. 5. No other congenital anomalies identified. Electronically Signed   By: Tobias Alexander   On: 07/20/2017 12:55   Result Date: 07/20/2017 EXAM: OVER-READ INTERPRETATION  CT CHEST The following report is an over-read performed by radiologist Dr. Trudie Reed of [facility] on 07/20/2017. This over-read does not include interpretation of cardiac or coronary anatomy or pathology. The coronary calcium score/coronary CTA interpretation by the cardiologist is attached. COMPARISON:  Coronary calcium CT scan 07/01/2017. FINDINGS: Extracardiac findings will be described separately under dictation for contemporaneously obtained CTA chest, abdomen and pelvis. IMPRESSION: Please see separate dictation for contemporaneously obtained CTA chest, abdomen and pelvis 07/20/2017 for full description of relevant extracardiac findings. Electronically Signed: By: Trudie Reed M.D. On: 07/20/2017 10:52   Ct Angio Chest Aorta W/cm &/or Wo/cm  Result Date: 07/20/2017 CLINICAL DATA:  62 year old female with history of severe aortic stenosis. Preprocedural study prior to potential transcatheter aortic valve replacement (TAVR) procedure. EXAM: CT ANGIOGRAPHY CHEST, ABDOMEN AND PELVIS TECHNIQUE:  Multidetector CT imaging through the chest, abdomen and pelvis was performed using the standard protocol during bolus administration of intravenous contrast. Multiplanar reconstructed images and MIPs were obtained and reviewed to evaluate the vascular anatomy. CONTRAST:  ISOVUE-370 IOPAMIDOL (ISOVUE-370) INJECTION 76% COMPARISON:  Chest CT 06/24/2010. Coronary calcium score CT scan 07/01/2017. FINDINGS: CTA CHEST FINDINGS Cardiovascular: Heart size is normal. There is no significant pericardial fluid, thickening or pericardial calcification. Aortic atherosclerosis. No definite coronary artery calcifications. Severe thickening calcification of the aortic valve. Mediastinum/Lymph Nodes: No pathologically enlarged mediastinal or hilar lymph nodes. Esophagus is unremarkable in appearance. No axillary lymphadenopathy. Lungs/Pleura: No suspicious appearing pulmonary nodules or masses. No acute consolidative airspace disease. No pleural effusions. Musculoskeletal/Soft Tissues: There are no aggressive appearing lytic or blastic lesions noted in the visualized portions of the skeleton. CTA ABDOMEN AND PELVIS FINDINGS Hepatobiliary: Diffuse low attenuation throughout the hepatic parenchyma, suggestive of hepatic steatosis (difficult to say for certain on today's contrast enhanced examination). No suspicious cystic or solid hepatic lesions. No intra or extrahepatic biliary ductal dilatation. Status post cholecystectomy. Pancreas: No pancreatic mass. No pancreatic ductal dilatation. No pancreatic or peripancreatic fluid or inflammatory changes. Spleen: Small splenules adjacent to the spleen. Otherwise, unremarkable. Adrenals/Urinary Tract: Bilateral kidneys and bilateral adrenal glands are normal in appearance. No hydroureteronephrosis. Urinary bladder is normal in appearance. Stomach/Bowel: Normal appearance of the stomach. No pathologic dilatation of small bowel or colon. Normal appendix. Vascular/Lymphatic: Aortic  atherosclerosis, without evidence of aneurysm or dissection in the abdominal or pelvic vasculature. Vascular findings and measurements pertinent to potential TAVR procedure, as detailed below. Celiac axis, superior mesenteric artery and inferior mesenteric artery are all widely patent  without hemodynamically significant stenosis. Single renal arteries bilaterally are widely patent without hemodynamically significant stenosis. No lymphadenopathy noted in the abdomen or pelvis. Reproductive: Status post hysterectomy. Ovaries are not confidently identified may be surgically absent or atrophic. Other: No significant volume of ascites.  No pneumoperitoneum. Musculoskeletal: There are no aggressive appearing lytic or blastic lesions noted in the visualized portions of the skeleton. VASCULAR MEASUREMENTS PERTINENT TO TAVR: AORTA: Minimal Aortic Diameter-13 x 14 mm Severity of Aortic Calcification-moderate RIGHT PELVIS: Right Common Iliac Artery - Minimal Diameter-8.9 x 7.8 mm Tortuosity-mild Calcification-mild Right External Iliac Artery - Minimal Diameter-7.1 x 6.4 mm Tortuosity-mild Calcification-none Right Common Femoral Artery - Minimal Diameter-7.1 x 6.4 mm Tortuosity-mild Calcification-none LEFT PELVIS: Left Common Iliac Artery - Minimal Diameter-8.0 x 8.0 mm Tortuosity-mild Calcification-mild Left External Iliac Artery - Minimal Diameter-7.4 x 6.9 mm Tortuosity-mild Calcification-none Left Common Femoral Artery - Minimal Diameter-6.8 x 7.2 mm Tortuosity-mild Calcification-none Review of the MIP images confirms the above findings. IMPRESSION: 1. Vascular findings and measurements pertinent to potential TAVR procedure, as detailed above. 2. Severe thickening and calcification of the aortic valve, compatible with the reported clinical history of severe aortic stenosis. 3. Probable hepatic steatosis. 4. Additional incidental findings, as above. Aortic Atherosclerosis (ICD10-I70.0). Electronically Signed   By: Trudie Reed M.D.   On: 07/20/2017 11:19   Ct Angio Abd/pel W/ And/or W/o  Result Date: 07/20/2017 CLINICAL DATA:  62 year old female with history of severe aortic stenosis. Preprocedural study prior to potential transcatheter aortic valve replacement (TAVR) procedure. EXAM: CT ANGIOGRAPHY CHEST, ABDOMEN AND PELVIS TECHNIQUE: Multidetector CT imaging through the chest, abdomen and pelvis was performed using the standard protocol during bolus administration of intravenous contrast. Multiplanar reconstructed images and MIPs were obtained and reviewed to evaluate the vascular anatomy. CONTRAST:  ISOVUE-370 IOPAMIDOL (ISOVUE-370) INJECTION 76% COMPARISON:  Chest CT 06/24/2010. Coronary calcium score CT scan 07/01/2017. FINDINGS: CTA CHEST FINDINGS Cardiovascular: Heart size is normal. There is no significant pericardial fluid, thickening or pericardial calcification. Aortic atherosclerosis. No definite coronary artery calcifications. Severe thickening calcification of the aortic valve. Mediastinum/Lymph Nodes: No pathologically enlarged mediastinal or hilar lymph nodes. Esophagus is unremarkable in appearance. No axillary lymphadenopathy. Lungs/Pleura: No suspicious appearing pulmonary nodules or masses. No acute consolidative airspace disease. No pleural effusions. Musculoskeletal/Soft Tissues: There are no aggressive appearing lytic or blastic lesions noted in the visualized portions of the skeleton. CTA ABDOMEN AND PELVIS FINDINGS Hepatobiliary: Diffuse low attenuation throughout the hepatic parenchyma, suggestive of hepatic steatosis (difficult to say for certain on today's contrast enhanced examination). No suspicious cystic or solid hepatic lesions. No intra or extrahepatic biliary ductal dilatation. Status post cholecystectomy. Pancreas: No pancreatic mass. No pancreatic ductal dilatation. No pancreatic or peripancreatic fluid or inflammatory changes. Spleen: Small splenules adjacent to the spleen.  Otherwise, unremarkable. Adrenals/Urinary Tract: Bilateral kidneys and bilateral adrenal glands are normal in appearance. No hydroureteronephrosis. Urinary bladder is normal in appearance. Stomach/Bowel: Normal appearance of the stomach. No pathologic dilatation of small bowel or colon. Normal appendix. Vascular/Lymphatic: Aortic atherosclerosis, without evidence of aneurysm or dissection in the abdominal or pelvic vasculature. Vascular findings and measurements pertinent to potential TAVR procedure, as detailed below. Celiac axis, superior mesenteric artery and inferior mesenteric artery are all widely patent without hemodynamically significant stenosis. Single renal arteries bilaterally are widely patent without hemodynamically significant stenosis. No lymphadenopathy noted in the abdomen or pelvis. Reproductive: Status post hysterectomy. Ovaries are not confidently identified may be surgically absent or atrophic. Other: No significant volume of ascites.  No pneumoperitoneum. Musculoskeletal: There are no aggressive appearing lytic or blastic lesions noted in the visualized portions of the skeleton. VASCULAR MEASUREMENTS PERTINENT TO TAVR: AORTA: Minimal Aortic Diameter-13 x 14 mm Severity of Aortic Calcification-moderate RIGHT PELVIS: Right Common Iliac Artery - Minimal Diameter-8.9 x 7.8 mm Tortuosity-mild Calcification-mild Right External Iliac Artery - Minimal Diameter-7.1 x 6.4 mm Tortuosity-mild Calcification-none Right Common Femoral Artery - Minimal Diameter-7.1 x 6.4 mm Tortuosity-mild Calcification-none LEFT PELVIS: Left Common Iliac Artery - Minimal Diameter-8.0 x 8.0 mm Tortuosity-mild Calcification-mild Left External Iliac Artery - Minimal Diameter-7.4 x 6.9 mm Tortuosity-mild Calcification-none Left Common Femoral Artery - Minimal Diameter-6.8 x 7.2 mm Tortuosity-mild Calcification-none Review of the MIP images confirms the above findings. IMPRESSION: 1. Vascular findings and measurements pertinent  to potential TAVR procedure, as detailed above. 2. Severe thickening and calcification of the aortic valve, compatible with the reported clinical history of severe aortic stenosis. 3. Probable hepatic steatosis. 4. Additional incidental findings, as above. Aortic Atherosclerosis (ICD10-I70.0). Electronically Signed   By: Trudie Reed M.D.   On: 07/20/2017 11:19       Discharge Instructions    Amb Referral to Cardiac Rehabilitation   Complete by:  As directed    Diagnosis:  Valve Replacement   Valve:  Aortic      Discharge Medications: Allergies as of 08/01/2017      Reactions   Codeine Itching   Prednisone Itching      Medication List    TAKE these medications   acetaminophen 500 MG tablet Commonly known as:  TYLENOL Take 1,000 mg by mouth every 6 (six) hours as needed.   albuterol 108 (90 Base) MCG/ACT inhaler Commonly known as:  PROVENTIL HFA;VENTOLIN HFA Inhale 2 puffs into the lungs every 6 (six) hours as needed for wheezing or shortness of breath.   ALPRAZolam 0.25 MG tablet Commonly known as:  XANAX Take 0.25 mg by mouth 2 (two) times daily.   amoxicillin 500 MG capsule Commonly known as:  AMOXIL Take 2,000 mg (4 tablets) 45 minutes prior to dental procedures.   aspirin 325 MG EC tablet Take 1 tablet (325 mg total) by mouth daily.   atenolol 50 MG tablet Commonly known as:  TENORMIN Take 50 mg by mouth daily.   buPROPion 150 MG 24 hr tablet Commonly known as:  WELLBUTRIN XL Take 150 mg by mouth daily.   calcium carbonate 600 MG Tabs tablet Commonly known as:  OS-CAL Take 600 mg by mouth daily.   fluticasone 50 MCG/ACT nasal spray Commonly known as:  FLONASE Place 1 spray into both nostrils daily as needed.   Fluticasone-Salmeterol 250-50 MCG/DOSE Aepb Commonly known as:  ADVAIR Inhale 1 puff into the lungs 2 (two) times daily.   furosemide 40 MG tablet Commonly known as:  LASIX Take 1 tablet (40 mg total) by mouth daily. For 5 days then  stop.   mirtazapine 45 MG tablet Commonly known as:  REMERON Take 45 mg by mouth at bedtime.   multivitamin capsule Take 1 capsule by mouth daily.   omeprazole 20 MG capsule Commonly known as:  PRILOSEC Take 20 mg by mouth daily.   potassium chloride SA 20 MEQ tablet Commonly known as:  K-DUR,KLOR-CON Take 1 tablet (20 mEq total) by mouth daily. For 5 days then stop.   traMADol 50 MG tablet Commonly known as:  ULTRAM Take 50 mg by mouth every 4-6 hours PRN severe pain. What changed:    how much to take  how to take this  when to take this  reasons to take this  additional instructions      The patient has been discharged on:   1.Beta Blocker:  Yes [  x ]                              No   [   ]                              If No, reason:  2.Ace Inhibitor/ARB: Yes [   ]                                     No  [  x  ]                                     If No, reason:Labile BP  3.Statin:   Yes [   ]                  No  [  x ]                  If No, reason:No CAD  4.Marlowe Kays:  Yes  [  x ]                  No   [   ]                  If No, reason:  Follow Up Appointments: Follow-up Information    Purcell Nails, MD. Go on 08/22/2017.   Specialty:  Cardiothoracic Surgery Why:  PA/LAT CXR to be taken (at [facility] which is in the same building as Dr. Orvan July office) on 08/22/2017 at 12:30 pm ;Appointment time is at 1:00 pm Contact information: [address] [employer] [address] [place] [address] [phone]        Baldo Daub, MD. Go on 08/09/2017.   Specialty:  Cardiology Why:  Appointment time is at 8:00 am Contact information: [address] [place] [address] [phone]           Signed: Lelon Huh [facility] 08/01/2017, 7:38 AM

## 2017-07-27 NOTE — Anesthesia Preprocedure Evaluation (Signed)
Anesthesia Evaluation  Patient identified by MRN, date of birth, ID band Patient awake    Reviewed: Allergy & Precautions, NPO status , Patient's Chart, lab work & pertinent test results  Airway Mallampati: II  TM Distance: >3 FB Neck ROM: Full    Dental  (+) Teeth Intact, Dental Advisory Given   Pulmonary    breath sounds clear to auscultation       Cardiovascular hypertension,  Rhythm:Regular Rate:Normal + Systolic murmurs    Neuro/Psych    GI/Hepatic   Endo/Other    Renal/GU      Musculoskeletal   Abdominal   Peds  Hematology   Anesthesia Other Findings   Reproductive/Obstetrics                             Anesthesia Physical Anesthesia Plan  ASA: III  Anesthesia Plan: General   Post-op Pain Management:    Induction: Intravenous  PONV Risk Score and Plan: Ondansetron and Dexamethasone  Airway Management Planned: Oral ETT  Additional Equipment: Arterial line, CVP, PA Cath, 3D TEE and Ultrasound Guidance Line Placement  Intra-op Plan:   Post-operative Plan:   Informed Consent: I have reviewed the patients History and Physical, chart, labs and discussed the procedure including the risks, benefits and alternatives for the proposed anesthesia with the patient or authorized representative who has indicated his/her understanding and acceptance.   Dental advisory given  Plan Discussed with: CRNA and Anesthesiologist  Anesthesia Plan Comments:         Anesthesia Quick Evaluation

## 2017-07-27 NOTE — Progress Notes (Signed)
      [address]       [place] [id number]             [phone]        CARDIOTHORACIC SURGERY PROGRESS NOTE   R1 Day Post-Op Procedure(s) (LRB): MINIMALLY INVASIVE AORTIC VALVE REPLACEMENT (AVR) (N/A) TRANSESOPHAGEAL ECHOCARDIOGRAM (TEE) (N/A)  Subjective: Feels sore in chest.  Some nausea.  Mild dizziness and blurry vision both eyes.  Objective: Vital signs: BP Readings from Last 1 Encounters:  07/27/17 (!) 80/55   Pulse Readings from Last 1 Encounters:  07/27/17 87   Resp Readings from Last 1 Encounters:  07/27/17 19   Temp Readings from Last 1 Encounters:  07/27/17 99.1 F (37.3 C)    Hemodynamics: PAP: (14-38)/(7-30) 24/15 CO:  [3.2 L/min-5.2 L/min] 4.5 L/min CI:  [1.7 L/min/m2-2.8 L/min/m2] 2.4 L/min/m2  Physical Exam:  Rhythm:   sinus  Breath sounds: clear  Heart sounds:  RRR  Incisions:  Dressings dry, intact  Abdomen:  Soft, non-distended, non-tender  Extremities:  Warm, well-perfused  Chest tubes:  low volume thin serosanguinous output, no air leak    Intake/Output from previous day: 06/04 0701 - 06/05 0700 In: 7611.4 [I.V.:5611.4; Blood:250; IV Piggyback:1750] Out: 3654 [Urine:2690; Blood:480; Chest Tube:484] Intake/Output this shift: No intake/output data recorded.  Lab Results:  CBC: Recent Labs    07/26/17 1924 07/27/17 0247  WBC 13.3* 11.5*  HGB 8.8* 7.9*  HCT 26.2* 24.5*  PLT 139* 117*    BMET:  Recent Labs    07/26/17 1923 07/26/17 1924 07/27/17 0247  NA 138  --  138  K 4.9  --  4.9  CL 107  --  109  CO2  --   --  26  GLUCOSE 133*  --  122*  BUN 14  --  13  CREATININE 0.60 0.66 0.73  CALCIUM  --   --  7.7*     PT/INR:   Recent Labs    07/26/17 1327  LABPROT 16.9*  INR 1.38    CBG (last 3)  Recent Labs    07/27/17 0005 07/27/17 0310 07/27/17 0800  GLUCAP 138* 115* 96    ABG    Component Value Date/Time   PHART 7.324 (L) 07/27/2017 0313   PCO2ART 42.7 07/27/2017 0313   PO2ART  98.0 07/27/2017 0313   HCO3 22.3 07/27/2017 0313   TCO2 24 07/27/2017 0313   ACIDBASEDEF 4.0 (H) 07/27/2017 0313   O2SAT 97.0 07/27/2017 0313    CXR: Mild atelectasis right lung, otherwise clear  EKG: NSR w/out acute ischemic changes, LBBB (new)   Assessment/Plan: S/P Procedure(s) (LRB): MINIMALLY INVASIVE AORTIC VALVE REPLACEMENT (AVR) (N/A) TRANSESOPHAGEAL ECHOCARDIOGRAM (TEE) (N/A)  Doing well POD1 Maintaining NSR w/ stable BP on low dose Neo for BP support Breathing comfortably w/ O2 sats 97-100% on 2 L/min Expected post op acute blood loss anemia, Hgb 7.9 Expected post op atelectasis, mild Expected post op volume excess, weight reportedly 10 lbs > preop, UOP adequate LBBB (new) OSA on CPAP at home Chronic anxiety   Mobilize  D/C lines  Hold diuretics until BP improves  Hold beta blockers D/C tubes later today or tomorrow, depending on output   Purcell Nailslarence H Jaman Aro, MD 07/27/2017 8:19 AM

## 2017-07-27 NOTE — Progress Notes (Signed)
Anesthesiology Follow-up:  Awake and alert, neuro intact, having some nausea, otherwise in good spirits. Sitting up in bed.  VS: T- 37.3 BP- 95/47 HR- 87 (SR) RR- 19 O2 Sat 100% on 2L [place]. PA- 24/15 CO/CI- 4.5/2.4  K- 4.9 Na- 138 BUN/Cr.- 13/0.73  H/H- 7.9/24.5 Platelets- 117,000  CXR: Moderate- R. Lung atelectasis and volume loss  Extubated 8 hours post-op.  62 year old female one day S/P minimally invasive aortic valve replacement with 23 mm Intuity rapid deployment valve. Stable post-op course.  Kipp Broodavid Raekwon Winkowski

## 2017-07-27 NOTE — Progress Notes (Signed)
Patient ID: Miranda Berger, female   DOB: 12/27/1955, 62 y.o.   MRN: [id number] TCTS Evening rounds:  Hemodynamically stable  but still on neo at 60 mcg to maintain adequate BP. Rhythm is sinus 90's  Good urine output CT output low  BMET    Component Value Date/Time   NA 139 07/27/2017 1658   NA 141 07/06/2017 1055   K 3.9 07/27/2017 1658   CL 102 07/27/2017 1658   CO2 26 07/27/2017 0247   GLUCOSE 117 (H) 07/27/2017 1658   BUN 14 07/27/2017 1658   BUN 12 07/06/2017 1055   CREATININE 0.70 07/27/2017 1658   CALCIUM 7.7 (L) 07/27/2017 0247   GFRNONAA >60 07/27/2017 1649   GFRAA >60 07/27/2017 1649   CBC    Component Value Date/Time   WBC 18.3 (H) 07/27/2017 1649   RBC 2.71 (L) 07/27/2017 1649   HGB 7.8 (L) 07/27/2017 1658   HGB 12.8 07/06/2017 1055   HCT 23.0 (L) 07/27/2017 1658   HCT 37.5 07/06/2017 1055   PLT 156 07/27/2017 1649   PLT 256 07/06/2017 1055   MCV 92.6 07/27/2017 1649   MCV 89 07/06/2017 1055   MCH 30.3 07/27/2017 1649   MCHC 32.7 07/27/2017 1649   RDW 13.2 07/27/2017 1649   RDW 13.3 07/06/2017 1055   Wean neo as tolerated. Anemia may be contributing to vasopressor need but will observe for now.

## 2017-07-27 NOTE — Anesthesia Postprocedure Evaluation (Signed)
Anesthesia Post Note  Patient: Miranda Berger  Procedure(s) Performed: MINIMALLY INVASIVE AORTIC VALVE REPLACEMENT (AVR) (N/A Chest) TRANSESOPHAGEAL ECHOCARDIOGRAM (TEE) (N/A Esophagus)     Patient location during evaluation: SICU Anesthesia Type: General Level of consciousness: sedated Pain management: pain level controlled Vital Signs Assessment: post-procedure vital signs reviewed and stable Respiratory status: patient remains intubated per anesthesia plan Cardiovascular status: stable Postop Assessment: no apparent nausea or vomiting Anesthetic complications: no    Last Vitals:  Vitals:   07/27/17 0645 07/27/17 0700  BP:  (!) 80/55  Pulse: 82 87  Resp: 11 19  Temp: 37.3 C 37.3 C  SpO2: 100% 100%    Last Pain:  Vitals:   07/27/17 0700  TempSrc:   PainSc: 4                  Olufemi Mofield COKER

## 2017-07-28 ENCOUNTER — Inpatient Hospital Stay (HOSPITAL_COMMUNITY): Payer: BC Managed Care – PPO

## 2017-07-28 LAB — PREPARE RBC (CROSSMATCH)

## 2017-07-28 LAB — CBC
HCT: 23.4 % — ABNORMAL LOW (ref 36.0–46.0)
Hemoglobin: 7.5 g/dL — ABNORMAL LOW (ref 12.0–15.0)
MCH: 30 pg (ref 26.0–34.0)
MCHC: 32.1 g/dL (ref 30.0–36.0)
MCV: 93.6 fL (ref 78.0–100.0)
PLATELETS: 139 10*3/uL — AB (ref 150–400)
RBC: 2.5 MIL/uL — AB (ref 3.87–5.11)
RDW: 13.2 % (ref 11.5–15.5)
WBC: 13.7 10*3/uL — AB (ref 4.0–10.5)

## 2017-07-28 LAB — BASIC METABOLIC PANEL
Anion gap: 4 — ABNORMAL LOW (ref 5–15)
BUN: 12 mg/dL (ref 6–20)
CALCIUM: 8 mg/dL — AB (ref 8.9–10.3)
CO2: 27 mmol/L (ref 22–32)
CREATININE: 0.73 mg/dL (ref 0.44–1.00)
Chloride: 106 mmol/L (ref 101–111)
Glucose, Bld: 108 mg/dL — ABNORMAL HIGH (ref 65–99)
Potassium: 4 mmol/L (ref 3.5–5.1)
SODIUM: 137 mmol/L (ref 135–145)

## 2017-07-28 LAB — GLUCOSE, CAPILLARY
GLUCOSE-CAPILLARY: 100 mg/dL — AB (ref 65–99)
Glucose-Capillary: 104 mg/dL — ABNORMAL HIGH (ref 65–99)
Glucose-Capillary: 148 mg/dL — ABNORMAL HIGH (ref 65–99)

## 2017-07-28 MED ORDER — FUROSEMIDE 10 MG/ML IJ SOLN
20.0000 mg | Freq: Once | INTRAMUSCULAR | Status: AC
  Administered 2017-07-28: 20 mg via INTRAVENOUS
  Filled 2017-07-28: qty 2

## 2017-07-28 MED ORDER — METOCLOPRAMIDE HCL 5 MG/ML IJ SOLN: 20.0000 mg | Freq: Four times a day (QID) | INTRAVENOUS | Status: DC

## 2017-07-28 MED ORDER — METOCLOPRAMIDE HCL 5 MG/ML IJ SOLN
5.0000 mg | Freq: Four times a day (QID) | INTRAMUSCULAR | Status: AC
  Administered 2017-07-28 – 2017-07-29 (×4): 5 mg via INTRAVENOUS
  Filled 2017-07-28 (×4): qty 2

## 2017-07-28 MED ORDER — SODIUM CHLORIDE 0.9 % IV SOLN: Freq: Once | INTRAVENOUS | Status: AC

## 2017-07-28 MED ORDER — MOVING RIGHT ALONG BOOK
Freq: Once | Status: AC
  Administered 2017-07-28: 09:00:00
  Filled 2017-07-28: qty 1

## 2017-07-28 MED ORDER — MIRTAZAPINE 15 MG PO TABS
45.0000 mg | ORAL_TABLET | Freq: Every evening | ORAL | Status: DC | PRN
  Administered 2017-07-30 – 2017-07-31 (×3): 45 mg via ORAL
  Filled 2017-07-28 (×3): qty 3

## 2017-07-28 MED ORDER — SODIUM CHLORIDE 0.9 % IV SOLN
Freq: Once | INTRAVENOUS | Status: AC
  Administered 2017-07-28: 08:00:00 via INTRAVENOUS

## 2017-07-28 MED ORDER — FA-PYRIDOXINE-CYANOCOBALAMIN 2.5-25-2 MG PO TABS
1.0000 | ORAL_TABLET | Freq: Every day | ORAL | Status: DC
  Administered 2017-07-28 – 2017-08-01 (×5): 1 via ORAL
  Filled 2017-07-28 (×5): qty 1

## 2017-07-28 MED ORDER — FUROSEMIDE 10 MG/ML IJ SOLN: 20.0000 mg | Freq: Once | INTRAMUSCULAR | Status: DC

## 2017-07-28 NOTE — Discharge Instructions (Signed)
Activity: 1.May walk up steps                2.No lifting more than ten pounds for two weeks.                 3.No driving for two weeks and must NOT be taking any narcotic medication for pain                 4.Stop any activity that causes chest pain, shortness of breath, dizziness, sweating or excessive weakness.                5.Continue with your breathing exercises daily.  Diet: Low salt, low fat diet  Wound Care: May shower.  Clean wounds with mild soap and water daily. Contact the office at [phone] if any problems arise.

## 2017-07-28 NOTE — Progress Notes (Signed)
Patient ID: Assunta GamblesRita S Fomby, female   DOB: 03/13/1955, 62 y.o.   MRN: [id number] EVENING ROUNDS NOTE :     [address]       [place], [id number]             [phone]                 2 Days Post-Op Procedure(s) (LRB): MINIMALLY INVASIVE AORTIC VALVE REPLACEMENT (AVR) (N/A) TRANSESOPHAGEAL ECHOCARDIOGRAM (TEE) (N/A)  Total Length of Stay:  LOS: 2 days  BP 107/63   Pulse (!) 103   Temp 98.3 F (36.8 C) (Oral)   Resp 17   Ht 5\' 4"  (1.626 m)   Wt 191 lb 2.2 oz (86.7 kg)   SpO2 92%   BMI 32.81 kg/m   .Intake/Output      06/06 0701 - 06/07 0700   I.V. (mL/kg) 323.2 (3.7)   Blood 1215   IV Piggyback    Total Intake(mL/kg) 1538.2 (17.7)   Urine (mL/kg/hr) 2375 (2.1)   Stool 0   Chest Tube 0   Total Output 2375   Net -836.8       Urine Occurrence 1 x   Stool Occurrence 1 x     . sodium chloride    . lactated ringers Stopped (07/27/17 0000)  . lactated ringers 20 mL/hr at 07/28/17 1700  . phenylephrine (NEO-SYNEPHRINE) Adult infusion Stopped (07/28/17 1104)     Lab Results  Component Value Date   WBC 13.7 (H) 07/28/2017   HGB 7.5 (L) 07/28/2017   HCT 23.4 (L) 07/28/2017   PLT 139 (L) 07/28/2017   GLUCOSE 108 (H) 07/28/2017   ALT 13 (L) 07/22/2017   AST 22 07/22/2017   NA 137 07/28/2017   K 4.0 07/28/2017   CL 106 07/28/2017   CREATININE 0.73 07/28/2017   BUN 12 07/28/2017   CO2 27 07/28/2017   INR 1.38 07/26/2017   HGBA1C 5.5 07/22/2017    Expected Acute  Blood - loss Anemia- continue to monitor  Stable evening  Delight OvensEdward B Lazarus [place] MD  Beeper [phone] Office [phone] 07/28/2017 8:11 PM

## 2017-07-28 NOTE — Progress Notes (Addendum)
TCTS DAILY ICU PROGRESS NOTE                   [address]            [place] [id number]          [phone]   2 Days Post-Op Procedure(s) (LRB): MINIMALLY INVASIVE AORTIC VALVE REPLACEMENT (AVR) (N/A) TRANSESOPHAGEAL ECHOCARDIOGRAM (TEE) (N/A)  Total Length of Stay:  LOS: 2 days   Subjective: Patient has intermittent nausea and incisional pain. She is receiving blood this am.  Objective: Vital signs in last 24 hours: Temp:  [97.6 F (36.4 C)-98.5 F (36.9 C)] 98 F (36.7 C) (06/06 0830) Pulse Rate:  [79-99] 95 (06/06 1000) Cardiac Rhythm: Normal sinus rhythm (06/06 0815) Resp:  [0-30] 23 (06/06 1000) BP: (80-118)/(48-81) 116/69 (06/06 1000) SpO2:  [90 %-100 %] 91 % (06/06 1000) Arterial Line BP: (72-95)/(47-82) 75/57 (06/05 1500) Weight:  [191 lb 2.2 oz (86.7 kg)] 191 lb 2.2 oz (86.7 kg) (06/06 0500)  Filed Weights   07/26/17 0602 07/27/17 0500 07/28/17 0500  Weight: 178 lb (80.7 kg) 188 lb 15 oz (85.7 kg) 191 lb 2.2 oz (86.7 kg)    Weight change: 2 lb 3.3 oz (1 kg)      Intake/Output from previous day: 06/05 0701 - 06/06 0700 In: 1343.9 [I.V.:1243.9; IV Piggyback:100] Out: 1725 [Urine:1185; Chest Tube:540]  Intake/Output this shift: Total I/O In: 486.8 [I.V.:171.8; Blood:315] Out: 95 [Urine:95]  Current Meds: Scheduled Meds: . acetaminophen  1,000 mg Oral Q6H  . ALPRAZolam  0.25 mg Oral BID  . aspirin EC  325 mg Oral Daily  . bisacodyl  10 mg Oral Daily   Or  . bisacodyl  10 mg Rectal Daily  . buPROPion  150 mg Oral Daily  . Chlorhexidine Gluconate Cloth  6 each Topical Daily  . docusate sodium  200 mg Oral Daily  . folic acid-pyridoxine-cyancobalamin  1 tablet Oral Daily  . furosemide  20 mg Intravenous Once  . furosemide  20 mg Intravenous Once  . mouth rinse  15 mL Mouth Rinse BID  . mometasone-formoterol  2 puff Inhalation BID  . pantoprazole  40 mg Oral Daily  . sodium chloride flush  10-40 mL Intracatheter Q12H  . sodium  chloride flush  3 mL Intravenous Q12H   Continuous Infusions: . sodium chloride    . lactated ringers Stopped (07/27/17 0000)  . lactated ringers 20 mL/hr at 07/28/17 1000  . phenylephrine (NEO-SYNEPHRINE) Adult infusion 15 mcg/min (07/28/17 1001)   PRN Meds:.metoprolol tartrate, mirtazapine, morphine injection, ondansetron (ZOFRAN) IV, oxyCODONE, pneumococcal 23 valent vaccine, sodium chloride flush, sodium chloride flush, traMADol  General appearance: alert, cooperative and no distress Heart: RRR, no murmur Lungs: Slightly diminished at bases R>L Abdomen: Soft, non tender, sporadic bowel sounds present Extremities: Mild LE edema Wound: Aquacel intact on right anterior chest wound  Lab Results: CBC: Recent Labs    07/27/17 1649 07/27/17 1658 07/28/17 0355  WBC 18.3*  --  13.7*  HGB 8.2* 7.8* 7.5*  HCT 25.1* 23.0* 23.4*  PLT 156  --  139*   BMET:  Recent Labs    07/27/17 0247  07/27/17 1658 07/28/17 0355  NA 138  --  139 137  K 4.9  --  3.9 4.0  CL 109  --  102 106  CO2 26  --   --  27  GLUCOSE 122*  --  117* 108*  BUN 13  --  14 12  CREATININE 0.73   < > 0.70 0.73  CALCIUM 7.7*  --   --  8.0*   < > = values in this interval not displayed.    CMET: Lab Results  Component Value Date   WBC 13.7 (H) 07/28/2017   HGB 7.5 (L) 07/28/2017   HCT 23.4 (L) 07/28/2017   PLT 139 (L) 07/28/2017   GLUCOSE 108 (H) 07/28/2017   ALT 13 (L) 07/22/2017   AST 22 07/22/2017   NA 137 07/28/2017   K 4.0 07/28/2017   CL 106 07/28/2017   CREATININE 0.73 07/28/2017   BUN 12 07/28/2017   CO2 27 07/28/2017   INR 1.38 07/26/2017   HGBA1C 5.5 07/22/2017    PT/INR:  Recent Labs    07/26/17 1327  LABPROT 16.9*  INR 1.38   Radiology: Dg Chest Port 1 View  Result Date: 07/28/2017 CLINICAL DATA:  Follow-up CABG. EXAM: PORTABLE CHEST 1 VIEW COMPARISON:  July 27, 2017 FINDINGS: The PA catheter is been removed. A left IJ sheath remains. Chest tubes remain. No pneumothorax. Opacity  in the right mid lung is stable. Opacity in the left base with probable tiny effusion is mildly more prominent the interval. Stable cardiomediastinal silhouette. IMPRESSION: 1. Support apparatus as above. 2. Stable opacity in the right mid lung. 3. Mild increased opacity in the left base with a probable small associated effusion. Electronically Signed   By: Gerome Sam III M.D   On: 07/28/2017 09:34     Assessment/Plan: S/P Procedure(s) (LRB): MINIMALLY INVASIVE AORTIC VALVE REPLACEMENT (AVR) (N/A) TRANSESOPHAGEAL ECHOCARDIOGRAM (TEE) (N/A)  1. CV-SR in the 90's.Just weaned off Neo Synephrine drip 2. Pulmonary-Chest tubes with 540 last 24 hours. Monitor output-will remove once output decreases. On 2 liters of oxygen via [place]. CXR this am shows no pneumothorax, small left pleural effusion and atelectasis. Encourage incentive spirometer 3. Volume overload-given low dose Lasix. Will be more aggressive with diuresis once off Neo 4. ABL anemia- H and H decreased to 7.5 and 23.4. Transfusing PRBC. Continue Foltx 5. Mild thrombocytopenia-platelets 139,000 6. CBGs 130/100/104. Pre op HGA1C 5.5. Will stop accu checks and SS upon transfer 7. GI_intermittent nausea. Will give a few doses of Reglan  Donielle Margaretann Loveless PA-C 07/28/2017 10:37 AM   I have seen and examined the patient and agree with the assessment and plan as outlined.  Transfuse for symptomatic acute blood loss anemia.  Purcell Nails, MD 07/28/2017 11:49 AM

## 2017-07-28 NOTE — Plan of Care (Signed)
Pt progressing well with Post op education

## 2017-07-28 NOTE — Care Management Note (Signed)
Case Management Note Donn PieriniKristi Marene Gilliam RN,BSN [facility]22 Case Manager  [phone]  Patient Details  Name: Miranda Berger MRN: [id number] Date of Birth: 02/28/1955  Subjective/Objective:   Pt admitted s/p mini AVR                 Action/Plan: PTA pt lived at home with spouse- anticipate return home. CM to follow for transition of care needs  Expected Discharge Date:                  Expected Discharge Plan:  Home/Self Care  In-House Referral:     Discharge planning Services  CM Consult  Post Acute Care Choice:    Choice offered to:     DME Arranged:    DME Agency:     HH Arranged:    HH Agency:     Status of Service:  In process, will continue to follow  If discussed at Long Length of Stay Meetings, dates discussed:    Discharge Disposition:   Additional Comments:  Miranda Berger, Miranda Chillemi Hall, RN 07/28/2017, 11:33 AM

## 2017-07-29 ENCOUNTER — Inpatient Hospital Stay (HOSPITAL_COMMUNITY): Payer: BC Managed Care – PPO

## 2017-07-29 ENCOUNTER — Other Ambulatory Visit: Payer: Self-pay

## 2017-07-29 LAB — TYPE AND SCREEN
ABO/RH(D): A POS
Antibody Screen: NEGATIVE
Unit division: 0
Unit division: 0

## 2017-07-29 LAB — BASIC METABOLIC PANEL
ANION GAP: 8 (ref 5–15)
BUN: 15 mg/dL (ref 6–20)
CHLORIDE: 107 mmol/L (ref 101–111)
CO2: 25 mmol/L (ref 22–32)
Calcium: 8.2 mg/dL — ABNORMAL LOW (ref 8.9–10.3)
Creatinine, Ser: 0.69 mg/dL (ref 0.44–1.00)
GFR calc non Af Amer: >60 mL/min (ref >60)
Glucose, Bld: 89 mg/dL (ref 65–99)
Potassium: 3.6 mmol/L (ref 3.5–5.1)
SODIUM: 140 mmol/L (ref 135–145)

## 2017-07-29 LAB — CBC
HCT: 30.2 % — ABNORMAL LOW (ref 36.0–46.0)
HEMOGLOBIN: 10.1 g/dL — AB (ref 12.0–15.0)
MCH: 30.8 pg (ref 26.0–34.0)
MCHC: 33.4 g/dL (ref 30.0–36.0)
MCV: 92.1 fL (ref 78.0–100.0)
Platelets: 109 10*3/uL — ABNORMAL LOW (ref 150–400)
RBC: 3.28 MIL/uL — ABNORMAL LOW (ref 3.87–5.11)
RDW: 14.2 % (ref 11.5–15.5)
WBC: 7.8 10*3/uL (ref 4.0–10.5)

## 2017-07-29 LAB — BPAM RBC
BLOOD PRODUCT EXPIRATION DATE: 201906252359
Blood Product Expiration Date: 201906252359
ISSUE DATE / TIME: 201906061051
ISSUE DATE / TIME: 201906060812
UNIT TYPE AND RH: 6200
Unit Type and Rh: 6200

## 2017-07-29 MED ORDER — POTASSIUM CHLORIDE CRYS ER 20 MEQ PO TBCR: 20.0000 meq | EXTENDED_RELEASE_TABLET | Freq: Every day | ORAL | Status: DC

## 2017-07-29 MED ORDER — ATENOLOL 25 MG PO TABS
12.5000 mg | ORAL_TABLET | Freq: Two times a day (BID) | ORAL | Status: DC
  Administered 2017-07-29 – 2017-07-30 (×4): 12.5 mg via ORAL
  Filled 2017-07-29 (×6): qty 1

## 2017-07-29 MED ORDER — POTASSIUM CHLORIDE CRYS ER 20 MEQ PO TBCR
20.0000 meq | EXTENDED_RELEASE_TABLET | Freq: Two times a day (BID) | ORAL | Status: DC
  Administered 2017-07-29 – 2017-07-30 (×4): 20 meq via ORAL
  Filled 2017-07-29 (×4): qty 1

## 2017-07-29 MED ORDER — FUROSEMIDE 40 MG PO TABS: 40.0000 mg | ORAL_TABLET | Freq: Every day | ORAL | Status: DC

## 2017-07-29 MED ORDER — POTASSIUM CHLORIDE CRYS ER 20 MEQ PO TBCR
40.0000 meq | EXTENDED_RELEASE_TABLET | Freq: Once | ORAL | Status: AC
  Administered 2017-07-29: 40 meq via ORAL
  Filled 2017-07-29: qty 2

## 2017-07-29 MED ORDER — FUROSEMIDE 40 MG PO TABS
40.0000 mg | ORAL_TABLET | Freq: Two times a day (BID) | ORAL | Status: DC
  Administered 2017-07-29 – 2017-07-30 (×4): 40 mg via ORAL
  Filled 2017-07-29 (×4): qty 1

## 2017-07-29 MED FILL — Electrolyte-R (PH 7.4) Solution: INTRAVENOUS | Qty: 3000 | Status: AC

## 2017-07-29 MED FILL — Sodium Bicarbonate IV Soln 8.4%: INTRAVENOUS | Qty: 50 | Status: AC

## 2017-07-29 MED FILL — Magnesium Sulfate Inj 50%: INTRAMUSCULAR | Qty: 10 | Status: AC

## 2017-07-29 MED FILL — Sodium Chloride IV Soln 0.9%: INTRAVENOUS | Qty: 2000 | Status: AC

## 2017-07-29 MED FILL — Heparin Sodium (Porcine) Inj 1000 Unit/ML: INTRAMUSCULAR | Qty: 30 | Status: AC

## 2017-07-29 MED FILL — Mannitol IV Soln 20%: INTRAVENOUS | Qty: 500 | Status: AC

## 2017-07-29 MED FILL — Potassium Chloride Inj 2 mEq/ML: INTRAVENOUS | Qty: 40 | Status: AC

## 2017-07-29 NOTE — Progress Notes (Addendum)
TCTS DAILY ICU PROGRESS NOTE                   [address]            [place] [address]          [phone]   3 Days Post-Op Procedure(s) (LRB): MINIMALLY INVASIVE AORTIC VALVE REPLACEMENT (AVR) (N/A) TRANSESOPHAGEAL ECHOCARDIOGRAM (TEE) (N/A)  Total Length of Stay:  LOS: 3 days   Subjective: Patient has less nausea since given Reglan yesterday.  Objective: Vital signs in last 24 hours: Temp:  [97.9 F (36.6 C)-98.5 F (36.9 C)] 98.1 F (36.7 C) (06/07 0400) Pulse Rate:  [91-118] 102 (06/07 0500) Cardiac Rhythm: Sinus tachycardia (06/07 0000) Resp:  [9-25] 13 (06/07 0500) BP: (92-118)/(57-81) 104/66 (06/07 0500) SpO2:  [89 %-95 %] 91 % (06/07 0500) Weight:  [188 lb 11.4 oz (85.6 kg)] 188 lb 11.4 oz (85.6 kg) (06/07 0500)  Filed Weights   07/27/17 0500 07/28/17 0500 07/29/17 0500  Weight: 188 lb 15 oz (85.7 kg) 191 lb 2.2 oz (86.7 kg) 188 lb 11.4 oz (85.6 kg)    Weight change: -2 lb 6.8 oz (-1.1 kg)      Intake/Output from previous day: 06/06 0701 - 06/07 0700 In: 1538.2 [I.V.:323.2; Blood:1215] Out: 2375 [Urine:2375]  Intake/Output this shift: No intake/output data recorded.  Current Meds: Scheduled Meds: . acetaminophen  1,000 mg Oral Q6H  . ALPRAZolam  0.25 mg Oral BID  . aspirin EC  325 mg Oral Daily  . atenolol  12.5 mg Oral BID  . bisacodyl  10 mg Oral Daily   Or  . bisacodyl  10 mg Rectal Daily  . buPROPion  150 mg Oral Daily  . Chlorhexidine Gluconate Cloth  6 each Topical Daily  . docusate sodium  200 mg Oral Daily  . folic acid-pyridoxine-cyancobalamin  1 tablet Oral Daily  . furosemide  40 mg Oral BID  . mouth rinse  15 mL Mouth Rinse BID  . mometasone-formoterol  2 puff Inhalation BID  . pantoprazole  40 mg Oral Daily  . potassium chloride  20 mEq Oral BID  . sodium chloride flush  10-40 mL Intracatheter Q12H  . sodium chloride flush  3 mL Intravenous Q12H   Continuous Infusions: . sodium chloride    . lactated  ringers Stopped (07/27/17 0000)  . lactated ringers 20 mL/hr at 07/28/17 1700   PRN Meds:.metoprolol tartrate, mirtazapine, morphine injection, ondansetron (ZOFRAN) IV, oxyCODONE, pneumococcal 23 valent vaccine, sodium chloride flush, sodium chloride flush, traMADol  General appearance: alert, cooperative and no distress Heart: Slightly tachycardic, no murmur Lungs: Diminished at bases  Abdomen: Soft, non tender, sporadic bowel sounds present Extremities: Mild LE edema Wound: Aquacel removed and wound is clean and dry.  Lab Results: CBC: Recent Labs    07/28/17 0355 07/29/17 0258  WBC 13.7* 7.8  HGB 7.5* 10.1*  HCT 23.4* 30.2*  PLT 139* 109*   BMET:  Recent Labs    07/28/17 0355 07/29/17 0258  NA 137 140  K 4.0 3.6  CL 106 107  CO2 27 25  GLUCOSE 108* 89  BUN 12 15  CREATININE 0.73 0.69  CALCIUM 8.0* 8.2*    CMET: Lab Results  Component Value Date   WBC 7.8 07/29/2017   HGB 10.1 (L) 07/29/2017   HCT 30.2 (L) 07/29/2017   PLT 109 (L) 07/29/2017   GLUCOSE 89 07/29/2017   ALT 13 (L) 07/22/2017   AST 22 07/22/2017  NA 140 07/29/2017   K 3.6 07/29/2017   CL 107 07/29/2017   CREATININE 0.69 07/29/2017   BUN 15 07/29/2017   CO2 25 07/29/2017   INR 1.38 07/26/2017   HGBA1C 5.5 07/22/2017    PT/INR:  Recent Labs    07/26/17 1327  LABPROT 16.9*  INR 1.38   Radiology: No results found.   Assessment/Plan: S/P Procedure(s) (LRB): MINIMALLY INVASIVE AORTIC VALVE REPLACEMENT (AVR) (N/A) TRANSESOPHAGEAL ECHOCARDIOGRAM (TEE) (N/A)  1. CV-ST in the low 100's. SBP low 100's. Dr. Cornelius Moraswen has stared her on low dose Atenolol. 2. Pulmonary-Chest tubes removed yesterday. On room air this am and  and CPAP at night. CXR this am shows no pneumothorax, small  pleural effusions and atelectasis, and stable opacity right mid lung. Encourage incentive spirometer 3. Volume overload-On Lasix 40 mg bid 4. ABL anemia- H and H increased to 10.1 and 30.2 after 2 units of PRBC  yesterday.. Transfusing PRBC. Continue Foltx 5. Thrombocytopenia-platelets 109,000 6. Supplement potassium 7. Transfer to 4E  Donielle Margaretann LovelessM Zimmerman PA-C 07/29/2017 7:17 AM    I have seen and examined the patient and agree with the assessment and plan as outlined.  Try restarting beta blocker at reduced dose but watch closely due to new LBBB  Purcell Nailslarence H Owen, MD 07/29/2017 9:22 AM

## 2017-07-29 NOTE — Progress Notes (Signed)
CPAP settings changed per pt request. New settings are: Auto CPAP Min 6-Max 12cmH20, with FFM, RA. Pt tolerating current settings. Pt advised to have RT called if any further assistance is needed.

## 2017-07-29 NOTE — Progress Notes (Signed)
Report called to 4E, room 5.  Pt will be transported by the SWOT team.  Pt made aware of the transfer.  Pt verbalizes understanding.  No complaints of pain or discomfort.  Dressing on chest clean dry and intact.

## 2017-07-29 NOTE — Progress Notes (Signed)
Pt alert and oriented x4, no complaints of pain or discomfort.  Bed in low position, call bell within reach.  Bed alarms on and functioning.  Assessment done and charted.  Will continue to monitor and do hourly rounding throughout the shift 

## 2017-07-29 NOTE — Progress Notes (Signed)
[facility],assessment completed see flowsheet, placed on tele CCMD notified patient oriented to room and staff, bed in lowest position, call bell within reach will continue to monitor.

## 2017-07-30 ENCOUNTER — Inpatient Hospital Stay (HOSPITAL_COMMUNITY): Payer: BC Managed Care – PPO

## 2017-07-30 LAB — CBC
HEMATOCRIT: 35.1 % — AB (ref 36.0–46.0)
Hemoglobin: 11.6 g/dL — ABNORMAL LOW (ref 12.0–15.0)
MCH: 30.5 pg (ref 26.0–34.0)
MCHC: 33 g/dL (ref 30.0–36.0)
MCV: 92.4 fL (ref 78.0–100.0)
Platelets: 192 10*3/uL (ref 150–400)
RBC: 3.8 MIL/uL — AB (ref 3.87–5.11)
RDW: 14.1 % (ref 11.5–15.5)
WBC: 8.2 10*3/uL (ref 4.0–10.5)

## 2017-07-30 LAB — BASIC METABOLIC PANEL
ANION GAP: 10 (ref 5–15)
BUN: 11 mg/dL (ref 6–20)
CHLORIDE: 104 mmol/L (ref 101–111)
CO2: 27 mmol/L (ref 22–32)
Calcium: 9 mg/dL (ref 8.9–10.3)
Creatinine, Ser: 0.86 mg/dL (ref 0.44–1.00)
GFR calc non Af Amer: >60 mL/min (ref >60)
Glucose, Bld: 154 mg/dL — ABNORMAL HIGH (ref 65–99)
POTASSIUM: 3.8 mmol/L (ref 3.5–5.1)
SODIUM: 141 mmol/L (ref 135–145)

## 2017-07-30 MED ORDER — MENTHOL 3 MG MT LOZG
1.0000 | LOZENGE | OROMUCOSAL | Status: DC | PRN
  Administered 2017-07-30: 3 mg via ORAL
  Filled 2017-07-30 (×2): qty 9

## 2017-07-30 NOTE — Progress Notes (Addendum)
      [address]       [place] [id number]             [phone]        4 Days Post-Op Procedure(s) (LRB): MINIMALLY INVASIVE AORTIC VALVE REPLACEMENT (AVR) (N/A) TRANSESOPHAGEAL ECHOCARDIOGRAM (TEE) (N/A)  Subjective: Patient about to be taken for CXR. Her only complaint is throat soreness.  Objective: Vital signs in last 24 hours: Temp:  [97.9 F (36.6 C)-98.3 F (36.8 C)] 98.3 F (36.8 C) (06/08 0541) Pulse Rate:  [96-115] 96 (06/07 2349) Cardiac Rhythm: Normal sinus rhythm (06/08 0703) Resp:  [16-22] 18 (06/08 0541) BP: (103-121)/(57-86) 119/86 (06/08 0541) SpO2:  [93 %-100 %] 100 % (06/08 0541) Weight:  [178 lb 12.8 oz (81.1 kg)] 178 lb 12.8 oz (81.1 kg) (06/08 0541)  Pre op weight 80.7 kg Current Weight  07/30/17 178 lb 12.8 oz (81.1 kg)       Intake/Output from previous day: 06/07 0701 - 06/08 0700 In: 453 [P.O.:450; I.V.:3] Out: 1730 [Urine:1730]   Physical Exam:  Cardiovascular: Slightly tachycardic, no murmur Pulmonary: Slightly diminished at bases Abdomen: Soft, non tender, bowel sounds present. Extremities: Mild bilateral lower extremity edema. Wounds: Clean and dry.  No erythema or signs of infection.  Lab Results: CBC: Recent Labs    07/28/17 0355 07/29/17 0258  WBC 13.7* 7.8  HGB 7.5* 10.1*  HCT 23.4* 30.2*  PLT 139* 109*   BMET:  Recent Labs    07/28/17 0355 07/29/17 0258  NA 137 140  K 4.0 3.6  CL 106 107  CO2 27 25  GLUCOSE 108* 89  BUN 12 15  CREATININE 0.73 0.69  CALCIUM 8.0* 8.2*    PT/INR:  Lab Results  Component Value Date   INR 1.38 07/26/2017   INR 0.84 07/22/2017   ABG:  INR: Will add last result for INR, ABG once components are confirmed Will add last 4 CBG results once components are confirmed  Assessment/Plan: 1. CV-Slightly tachy at 100 this am. Had new LBBB post op. On low dose Atenolol. 2. Pulmonary-On room air this am and  and CPAP at night. CXR this am appears stable (trace  right apical pneumothorax, atelectasis, small left pleural effusion) . Encourage incentive spirometer 3. Volume overload-On Lasix 40 mg bid. Will decrease to daily in am. 4. ABL anemia- Await this am's H and H . Continue Foltx 5. Thrombocytopenia-last platelets 109,000 6. Cepacol lozenges PRN sore throat. She has no throat erythema or thrush. 7. Possible discharge in am   Lelon HuhDonielle M [facility] CenterZimmermanPA-C 07/30/2017,7:26 AM [phone]  Chest x-ray clear, images personally reviewed Heart rate improved on atenolol 12.5 p.o. twice daily Agree with above assessment and plan P Zenaida NieceVan Trigt,MD

## 2017-07-30 NOTE — Plan of Care (Signed)
  Problem: Education: Goal: Knowledge of General Education information will improve Outcome: Progressing   Problem: Health Behavior/Discharge Planning: Goal: Ability to manage health-related needs will improve Outcome: Progressing   Problem: Clinical Measurements: Goal: Ability to maintain clinical measurements within normal limits will improve Outcome: Progressing Goal: Will remain free from infection Outcome: Progressing Goal: Diagnostic test results will improve Outcome: Progressing Goal: Respiratory complications will improve Outcome: Progressing Goal: Cardiovascular complication will be avoided Outcome: Progressing   Problem: Activity: Goal: Risk for activity intolerance will decrease Outcome: Progressing   Problem: Nutrition: Goal: Adequate nutrition will be maintained Outcome: Progressing   Problem: Coping: Goal: Level of anxiety will decrease Outcome: Progressing   Problem: Elimination: Goal: Will not experience complications related to bowel motility Outcome: Progressing Goal: Will not experience complications related to urinary retention Outcome: Progressing   Problem: Pain Managment: Goal: General experience of comfort will improve Outcome: Progressing   Problem: Safety: Goal: Ability to remain free from injury will improve Outcome: Progressing   Problem: Skin Integrity: Goal: Risk for impaired skin integrity will decrease Outcome: Progressing   Problem: Education: Goal: Ability to demonstrate proper wound care will improve Outcome: Progressing Goal: Knowledge of disease or condition will improve Outcome: Progressing Goal: Knowledge of the prescribed therapeutic regimen will improve Outcome: Progressing   Problem: Activity: Goal: Risk for activity intolerance will decrease Outcome: Progressing   Problem: Cardiac: Goal: Hemodynamic stability will improve Outcome: Progressing   Problem: Clinical Measurements: Goal: Postoperative  complications will be avoided or minimized Outcome: Progressing   Problem: Respiratory: Goal: Respiratory status will improve Outcome: Progressing   Problem: Skin Integrity: Goal: Wound healing without signs and symptoms of infection Outcome: Progressing Goal: Risk for impaired skin integrity will decrease Outcome: Progressing   Problem: Urinary Elimination: Goal: Ability to achieve and maintain adequate renal perfusion and functioning will improve Outcome: Progressing   

## 2017-07-30 NOTE — Progress Notes (Signed)
CARDIAC REHAB PHASE I   PRE:  Rate/Rhythm: 105 ST   BP:  Sitting: 126/81      SaO2: 96% RA  MODE:  Ambulation: 240 ft   POST:  Rate/Rhythm: 119 ST  BP:  Sitting: 132/75      SaO2: 96% RA   Pt ambulated 240 ft with 1 assist,  gait belt used. Pt had slow steady gait. Pt reported moderate SOB and fatigue during walk. Pt took one rest stop due to SOB. Pt also reported feeling dizzy towards end of walk. After a short rest break. Pt reported feeling better and dizziness subsiding. Pt returned to recliner with feet elevated. Ed completed with pt. Reviewed sternal precautions, restrictions, exercise guidelines, heart healthy nutrition, cardiac rehab and the use of the IS. [facility]. Call bell within reach.   [phone]  York Ceriseyara R Dura Mccormack MS, ACSM CEP  9:34 AM 07/30/2017

## 2017-07-31 MED ORDER — PHENOL 1.4 % MT LIQD
1.0000 | OROMUCOSAL | Status: DC | PRN
  Filled 2017-07-31: qty 177

## 2017-07-31 MED ORDER — FUROSEMIDE 40 MG PO TABS: 40.0000 mg | ORAL_TABLET | Freq: Every day | ORAL | 0 refills | Status: DC

## 2017-07-31 MED ORDER — POTASSIUM CHLORIDE CRYS ER 20 MEQ PO TBCR: 20.0000 meq | EXTENDED_RELEASE_TABLET | Freq: Every day | ORAL | Status: DC

## 2017-07-31 MED ORDER — ASPIRIN 325 MG PO TBEC: 325.0000 mg | DELAYED_RELEASE_TABLET | Freq: Every day | ORAL | 0 refills | Status: DC

## 2017-07-31 MED ORDER — POTASSIUM CHLORIDE CRYS ER 20 MEQ PO TBCR: 20.0000 meq | EXTENDED_RELEASE_TABLET | Freq: Every day | ORAL | 0 refills | Status: DC

## 2017-07-31 MED ORDER — BIOTENE DRY MOUTH MT LIQD: 15.0000 mL | OROMUCOSAL | Status: DC | PRN

## 2017-07-31 MED ORDER — TRAMADOL HCL 50 MG PO TABS: ORAL_TABLET | ORAL | 0 refills | Status: DC

## 2017-07-31 MED ORDER — ATENOLOL 25 MG PO TABS
25.0000 mg | ORAL_TABLET | Freq: Two times a day (BID) | ORAL | Status: DC
  Administered 2017-07-31 – 2017-08-01 (×3): 25 mg via ORAL
  Filled 2017-07-31 (×2): qty 1

## 2017-07-31 MED ORDER — POTASSIUM CHLORIDE CRYS ER 20 MEQ PO TBCR
20.0000 meq | EXTENDED_RELEASE_TABLET | Freq: Two times a day (BID) | ORAL | Status: DC
  Administered 2017-07-31 (×2): 20 meq via ORAL
  Filled 2017-07-31 (×2): qty 1

## 2017-07-31 MED ORDER — FUROSEMIDE 40 MG PO TABS
40.0000 mg | ORAL_TABLET | Freq: Every day | ORAL | Status: DC
Start: 2017-07-31 — End: 2017-08-01
  Administered 2017-07-31: 40 mg via ORAL
  Filled 2017-07-31: qty 1

## 2017-07-31 NOTE — Progress Notes (Addendum)
      [address]       Miranda Berger,[place] [id number]             [phone]        5 Days Post-Op Procedure(s) (LRB): MINIMALLY INVASIVE AORTIC VALVE REPLACEMENT (AVR) (N/A) TRANSESOPHAGEAL ECHOCARDIOGRAM (TEE) (N/A)  Subjective: Patient had an episode of dizziness after walking with cardiac rehab yesterday. Overall, she feels she is getting stronger, however. Also, she sometimes feels heart beating fast and still has the sensation that something is stuck/tickeling sensation in her throat  Objective: Vital signs in last 24 hours: Temp:  [98.4 F (36.9 C)-98.9 F (37.2 C)] 98.6 F (37 C) (06/09 0526) Pulse Rate:  [98-114] 114 (06/09 0526) Cardiac Rhythm: Normal sinus rhythm (06/09 0526) Resp:  [16-30] 30 (06/09 0526) BP: (123-141)/(78-95) 127/82 (06/09 0526) SpO2:  [91 %-96 %] 92 % (06/09 [id number]) Weight:  [173 lb 8 oz (78.7 kg)] 173 lb 8 oz (78.7 kg) (06/09 0526)  Pre op weight 80.7 kg Current Weight  07/31/17 173 lb 8 oz (78.7 kg)       Intake/Output from previous day: 06/08 0701 - 06/09 0700 In: -  Out: 2000 [Urine:2000]   Physical Exam:  Cardiovascular: Slightly tachycardic, no murmur Pulmonary: Mostly clear bilaterally Abdomen: Soft, non tender, bowel sounds present. Extremities: Trace bilateral lower extremity edema. Wounds: Clean and dry.  No erythema or signs of infection.  Lab Results: CBC: Recent Labs    07/29/17 0258 07/30/17 0908  WBC 7.8 8.2  HGB 10.1* 11.6*  HCT 30.2* 35.1*  PLT 109* 192   BMET:  Recent Labs    07/29/17 0258 07/30/17 0908  NA 140 141  K 3.6 3.8  CL 107 104  CO2 25 27  GLUCOSE 89 154*  BUN 15 11  CREATININE 0.69 0.86  CALCIUM 8.2* 9.0    PT/INR:  Lab Results  Component Value Date   INR 1.38 07/26/2017   INR 0.84 07/22/2017   ABG:  INR: Will add last result for INR, ABG once components are confirmed Will add last 4 CBG results once components are confirmed  Assessment/Plan: 1. CV-Tachy at times  into 110's. Has had new LBBB post op. On low dose Atenolol. As discussed with Miranda Berger, will increase Atenolol.  2. Pulmonary-On room air this am and  and CPAP at night. Encourage incentive spirometer 3. Volume overload-On Lasix 40 mg daily 4. ABL anemia- H and H yesterday stable at 11.6 and 35.1. Had previous transfusion. Continue Foltx 5. Thrombocytopenia resolved- platelets yesterday 192,000. 7. Monitor on tele with increased BB;hope to discharge in am   Miranda Berger Miranda [facility] Center[facility]immermanPA-C 07/31/2017,7:09 AM [phone]  patient examined and medical record reviewed,agree with above note. Miranda Berger 07/31/2017

## 2017-08-01 MED ORDER — POTASSIUM CHLORIDE CRYS ER 20 MEQ PO TBCR: 20.0000 meq | EXTENDED_RELEASE_TABLET | Freq: Every day | ORAL | Status: DC

## 2017-08-01 MED ORDER — ALBUTEROL SULFATE (2.5 MG/3ML) 0.083% IN NEBU
2.5000 mg | INHALATION_SOLUTION | Freq: Four times a day (QID) | RESPIRATORY_TRACT | Status: DC | PRN
Start: 2017-08-01 — End: 2017-08-01

## 2017-08-01 MED ORDER — FUROSEMIDE 40 MG PO TABS: 40.0000 mg | ORAL_TABLET | Freq: Every day | ORAL | Status: DC

## 2017-08-01 NOTE — Progress Notes (Addendum)
      [address]       [place] [id number]             [phone]        6 Days Post-Op Procedure(s) (LRB): MINIMALLY INVASIVE AORTIC VALVE REPLACEMENT (AVR) (N/A) TRANSESOPHAGEAL ECHOCARDIOGRAM (TEE) (N/A)  Subjective: Patient states throat less sore, she slept better as she used her on CPAP mask, and overall, she feels better.  Objective: Vital signs in last 24 hours: Temp:  [98.2 F (36.8 C)-98.7 F (37.1 C)] 98.4 F (36.9 C) (06/10 0532) Pulse Rate:  [88-130] 130 (06/10 0532) Cardiac Rhythm: Heart block (06/10 0532) Resp:  [19-26] 19 (06/10 0532) BP: (110-121)/(61-88) 113/80 (06/10 0532) SpO2:  [91 %-98 %] 91 % (06/10 0532) Weight:  [171 lb 1.6 oz (77.6 kg)] 171 lb 1.6 oz (77.6 kg) (06/10 0515)  Pre op weight 80.7 kg Current Weight  08/01/17 171 lb 1.6 oz (77.6 kg)       Intake/Output from previous day: 06/09 0701 - 06/10 0700 In: 120 [P.O.:120] Out: 900 [Urine:900]   Physical Exam:  Cardiovascular: RRR, no murmurS Pulmonary: Mostly clear bilaterally Abdomen: Soft, non tender, bowel sounds present. Extremities: Trace bilateral lower extremity edema. Wounds: Clean and dry.  No erythema or signs of infection.  Lab Results: CBC: Recent Labs    07/30/17 0908  WBC 8.2  HGB 11.6*  HCT 35.1*  PLT 192   BMET:  Recent Labs    07/30/17 0908  NA 141  K 3.8  CL 104  CO2 27  GLUCOSE 154*  BUN 11  CREATININE 0.86  CALCIUM 9.0    PT/INR:  Lab Results  Component Value Date   INR 1.38 07/26/2017   INR 0.84 07/22/2017   ABG:  INR: Will add last result for INR, ABG once components are confirmed Will add last 4 CBG results once components are confirmed  Assessment/Plan: 1. CV-She has been tachy at times into 110's+ but will less frequent episodes since Atenolol increased. Has had new LBBB post op and first degree heart block. As discussed with Dr. Donata ClayVan Trigt, no further titration of Atenolol at this time. 2. Pulmonary-On room  air this am and  and CPAP at night. Encourage incentive spirometer 3. Volume overload-On Lasix 40 mg daily 4. ABL anemia- H and H yesterday stable at 11.6 and 35.1. Had previous transfusion. Continue Foltx 5. As discussed with Dr. Donata ClayVan Trigt, ok to discharge   Donielle M ZimmermanPA-C 08/01/2017,7:03 AM [phone]  Agree with above assessment and plan for DC Wound care instructions reviewed with patient  P Donata ClayVan Trigt MD

## 2017-08-01 NOTE — Progress Notes (Signed)
CARDIAC REHAB PHASE I   PRE:  Rate/Rhythm: 97 SR PACs  BP:  Supine:   Sitting: 100/71  Standing:    SaO2: 92%RA  MODE:  Ambulation: 470 ft   POST:  Rate/Rhythm: 113 ST PACs  BP:  Supine:   Sitting: 104/60  Standing:    SaO2: 96%RA [id number] Pt walked 470 ft on RA with hand held asst. Gait steady and tolerated well. Hopes to go home. Denied SOB. To recliner after walk. Education completed on Saturday.   Luetta Nuttingharlene Harlin Mazzoni, RN BSN  08/01/2017 8:42 AM

## 2017-08-01 NOTE — Progress Notes (Signed)
Order received to discharge patient.  PIV access removed.  Telemetry monitor removed and CCMD notified.  Discharge instructions, follow up, medications and instructions for their use discussed with patient. 

## 2017-08-01 NOTE — Care Management Note (Signed)
Case Management Note Donn PieriniKristi Julieanna Geraci RN,BSN [facility] Case Manager  [phone]  Patient Details  Name: Miranda Berger MRN: [id number] Date of Birth: 09/30/1955  Subjective/Objective:   Pt admitted s/p mini AVR                 Action/Plan: PTA pt lived at home with spouse- anticipate return home. CM to follow for transition of care needs  Expected Discharge Date:  08/01/17               Expected Discharge Plan:  Home/Self Care  In-House Referral:     Discharge planning Services  CM Consult  Post Acute Care Choice:    Choice offered to:     DME Arranged:    DME Agency:     HH Arranged:    HH Agency:     Status of Service:  Completed, signed off  If discussed at [employer] Length of Stay Meetings, dates discussed:    Discharge Disposition: home/self care  Additional Comments:  08/01/17- 1020- Tiana Sivertson RN, CM- pt for d/c home today- no CM needs noted for transition home.   Darrold SpanWebster, Jessilyn Catino Hall, RN 08/01/2017, 10:21 AM

## 2017-08-08 DIAGNOSIS — I447 Left bundle-branch block, unspecified: Secondary | ICD-10-CM | POA: Insufficient documentation

## 2017-08-08 HISTORY — DX: Left bundle-branch block, unspecified: I44.7

## 2017-08-08 NOTE — Progress Notes (Signed)
Cardiology Office Note:    Date:  08/09/2017   ID:  Miranda Berger, DOB 04/07/1955, MRN [id number]  PCP:  Shirlean MylarWebb, Carol, MD  Cardiologist:  Norman HerrlichBrian Allaya Abbasi, MD    Referring MD: Shirlean MylarWebb, Carol, MD    ASSESSMENT:    1. 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 LBBB (left bundle branch block)   3. Essential hypertension    PLAN:    In order of problems listed above:  1. Stable she is making good recovery and is scheduled to follow-up with CT surgery in approximately 1 week and enroll in cardiac rehabilitation. 2. Improved not present on today's EKG 3. Stable blood pressure target   Next appointment: 3 months   Medication Adjustments/Labs and Tests Ordered: Current medicines are reviewed at length with the patient today.  Concerns regarding medicines are outlined above.  No orders of the defined types were placed in this encounter.  No orders of the defined types were placed in this encounter.   Chief Complaint  Patient presents with  . Follow-up    after AVR  . Cardiac Valve Problem    History of Present Illness:    Miranda Berger is a 62 y.o. female with a hx of a bicus[pid aortic valve, aortic stenosis and recent SAVR and new LBBB after surgery last seen by me 07/06/17.  Admit date: 07/26/2017 Discharge date: 08/01/2017 Admission Diagnoses: Severe aortic stenosis Discharge Diagnoses:  1. 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 History of Hypertension 3. History of Obstructive sleep apnea 4. History of anxiety 5. History of anemia 6. History of GERD (gastroesophageal reflux disease) 7. History of  PONV (postoperative nausea and vomiting) 8. History of asthma 9. History of Hepatitis A (as a child?) Procedure (s):   Minimally Invasive Aortic Valve Replacement Right Anterior Mini-thoracotomy EdwardsIntuity Elite Rapid Deployment Stented BovinePericardial Tissue Valve (size4323mm,model#[id number], serial  #[id number]) Plating of right2nd costal cartilage (KLS titanium plate) by Dr. Cornelius Moraswen on 07/26/2017.  Compliance with diet, lifestyle and medications: Yes   Overall is doing well but complains of fatigue was transfused while in hospital we will recheck a CBC today.  She had bruising of the right breast below her cardiac scar there is some organization present but no signs of infection.  This is been present since the time of surgery.  No chest pain shortness of breath palpitation or syncope Past Medical History:  Diagnosis Date  . Anemia    in the past   . Anxiety   . Asthma   . Depression   . Family history of adverse reaction to anesthesia    mom had nausea  . GERD (gastroesophageal reflux disease)   . Hepatitis    Hepatitis A in 3rd grade?  [name]. Hypertension   . PONV (postoperative nausea and vomiting)   . 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 Sleep apnea    uses CPAP, 12, starts at 6    Past Surgical History:  Procedure Laterality Date  . ABDOMINAL HYSTERECTOMY    . AORTIC VALVE REPLACEMENT N/A 07/26/2017   Procedure: MINIMALLY INVASIVE AORTIC VALVE REPLACEMENT (AVR);  Surgeon: Purcell Nailswen, Clarence H, MD;  [facility];  Service: Open Heart Surgery;  Laterality: N/A;  . BLADDER SURGERY  2011  . CARPAL TUNNEL RELEASE  2012  . CHOLECYSTECTOMY    . RIGHT/LEFT HEART CATH AND CORONARY ANGIOGRAPHY N/A 07/08/2017  Procedure: RIGHT/LEFT HEART CATH AND CORONARY ANGIOGRAPHY;  Surgeon: Tonny Bollman, MD;  Location: [facility] INVASIVE CV LAB;  Service: Cardiovascular;  Laterality: N/A;  . TEE WITHOUT CARDIOVERSION N/A 07/26/2017   Procedure: TRANSESOPHAGEAL ECHOCARDIOGRAM (TEE);  Surgeon: Purcell Nails, MD;  Location: [facility] OR;  Service: Open Heart Surgery;  Laterality: N/A;  . TONSILLECTOMY      Current Medications: Current Meds  Medication Sig  . acetaminophen (TYLENOL) 500 MG tablet Take 1,000 mg by mouth every 6 (six)  hours as needed.  [name] albuterol (PROVENTIL HFA;VENTOLIN HFA) 108 (90 Base) MCG/ACT inhaler Inhale 2 puffs into the lungs every 6 (six) hours as needed for wheezing or shortness of breath.  . ALPRAZolam (XANAX) 0.25 MG tablet Take 0.25 mg by mouth 2 (two) times daily.   [name] amoxicillin (AMOXIL) 500 MG capsule Take 2,000 mg (4 tablets) 45 minutes prior to dental procedures.  [name] atenolol (TENORMIN) 50 MG tablet Take 50 mg by mouth daily.  [name] buPROPion (WELLBUTRIN XL) 150 MG 24 hr tablet Take 150 mg by mouth daily.  . calcium carbonate (OS-CAL) 600 MG TABS tablet Take 600 mg by mouth daily.   . fluticasone (FLONASE) 50 MCG/ACT nasal spray Place 1 spray into both nostrils daily as needed.   . Fluticasone-Salmeterol (ADVAIR) 250-50 MCG/DOSE AEPB Inhale 1 puff into the lungs 2 (two) times daily.  . mirtazapine (REMERON) 45 MG tablet Take 45 mg by mouth at bedtime.  . Multiple Vitamin (MULTIVITAMIN) capsule Take 1 capsule by mouth daily.  [name] omeprazole (PRILOSEC) 20 MG capsule Take 20 mg by mouth daily.  . traMADol (ULTRAM) 50 MG tablet Take 50 mg by mouth every 4-6 hours PRN severe pain.     Allergies:   Codeine and Prednisone   Social History   Socioeconomic History  . Marital status: Married    Spouse name: Not on file  . Number of children: 1  . Years of education: BS  . Highest education level: Not on file  Occupational History  . Not on file  Social Needs  . Financial resource strain: Not on file  . Food insecurity:    Worry: Not on file    Inability: Not on file  . Transportation needs:    Medical: Not on file    Non-medical: Not on file  Tobacco Use  . Smoking status: Never Smoker  . Smokeless tobacco: Never Used  Substance and Sexual Activity  . Alcohol use: No    Alcohol/week: 0.0 oz  . Drug use: No  . Sexual activity: Not on file  Lifestyle  . Physical activity:    Days per week: Not on file    Minutes per session: Not on file  . Stress: Not on file  Relationships  .  Social connections:    Talks on phone: Not on file    Gets together: Not on file    Attends religious service: Not on file    Active member of club or organization: Not on file    Attends meetings of clubs or organizations: Not on file    Relationship status: Not on file  Other Topics Concern  . Not on file  Social History Narrative   Occasionally consumes tea or coffee     Family History: The patient's family history includes Heart Problems in her maternal aunt; Leukemia in her mother; Sleep apnea in her father; Stroke in her father and mother; Valvular heart disease in her brother, maternal aunt, and paternal grandfather. ROS:  Please see the history of present illness.    All other systems reviewed and are negative.  EKGs/Labs/Other Studies Reviewed:    The following studies were reviewed today:  EKG:  EKG ordered today.  The ekg ordered today demonstrates sinus rhythm narrow QRS  Recent Labs: 07/22/2017: ALT 13 07/27/2017: Magnesium 2.2 07/30/2017: BUN 11; Creatinine, Ser 0.86; Hemoglobin 11.6; Platelets 192; Potassium 3.8; Sodium 141  Recent Lipid Panel No results found for: CHOL, TRIG, HDL, CHOLHDL, VLDL, LDLCALC, LDLDIRECT  Physical Exam:    VS:  BP 122/78 (BP Location: Right Arm, Patient Position: Sitting, Cuff Size: Normal)   Pulse (!) 103   Ht 5\' 4"  (1.626 m)   Wt 173 lb 12.8 oz (78.8 kg)   SpO2 98%   BMI 29.83 kg/m     Wt Readings from Last 3 Encounters:  08/09/17 173 lb 12.8 oz (78.8 kg)  08/01/17 171 lb 1.6 oz (77.6 kg)  07/22/17 178 lb (80.7 kg)     GEN:  Well nourished, well developed in no acute distress HEENT: Normal NECK: No JVD; No carotid bruits LYMPHATICS: No lymphadenopathy CARDIAC: RRR, no murmurs, rubs, gallops RESPIRATORY:  Clear to auscultation without rales, wheezing or rhonchi  ABDOMEN: Soft, non-tender, non-distended MUSCULOSKELETAL:  No edema; No deformity  SKIN: Warm and dry NEUROLOGIC:  Alert and oriented x 3 PSYCHIATRIC:  Normal  affect    Signed, Norman Herrlich, MD  08/09/2017 8:12 AM    [facility]

## 2017-08-09 ENCOUNTER — Ambulatory Visit: Payer: BC Managed Care – PPO | Admitting: Cardiology

## 2017-08-09 ENCOUNTER — Encounter: Payer: Self-pay | Admitting: Cardiology

## 2017-08-09 ENCOUNTER — Telehealth: Payer: Self-pay

## 2017-08-09 VITALS — BP 122/78 | HR 103 | Ht 64.0 in | Wt 173.8 lb

## 2017-08-09 DIAGNOSIS — I10 Essential (primary) hypertension: Secondary | ICD-10-CM | POA: Diagnosis not present

## 2017-08-09 DIAGNOSIS — Z953 Presence of xenogenic heart valve: Secondary | ICD-10-CM

## 2017-08-09 DIAGNOSIS — I447 Left bundle-branch block, unspecified: Secondary | ICD-10-CM

## 2017-08-09 MED ORDER — ASPIRIN EC 325 MG PO TBEC: 325.0000 mg | DELAYED_RELEASE_TABLET | Freq: Every day | ORAL | 0 refills | Status: DC

## 2017-08-09 NOTE — Telephone Encounter (Signed)
Patient states that she understood that she was to only take the aspirin for 5 days along with her furosemide and potassium. Patient reviewed her paperwork along. I pulled up the after visit summary from the hospital discharge. Discussed with patient that instructions say to take aspirin 325 mg daily and does not say to take for only 5 days. Patient will take one today and begin taking daily. No further questions.

## 2017-08-09 NOTE — Patient Instructions (Signed)
Medication Instructions:  Your physician recommends that you continue on your current medications as directed. Please refer to the Current Medication list given to you today.  Labwork: Your physician recommends that you have the following labs drawn: CBC, BMP  Testing/Procedures: You had an EKG today.  Follow-Up: Your physician wants you to follow-up in: 3 months. You will receive a reminder letter in the mail two months in advance. If you don't receive a letter, please call our office to schedule the follow-up appointment.  Any Other Special Instructions Will Be Listed Below (If Applicable).     If you need a refill on your cardiac medications before your next appointment, please call your pharmacy.   

## 2017-08-09 NOTE — Telephone Encounter (Signed)
-----   Message from Baldo DaubBrian J Munley, MD sent at 08/09/2017 12:37 PM EDT ----- Please call cofirm should be on aspirin 325 mg day new AVR

## 2017-08-10 ENCOUNTER — Ambulatory Visit (INDEPENDENT_AMBULATORY_CARE_PROVIDER_SITE_OTHER): Payer: Self-pay | Admitting: *Deleted

## 2017-08-10 DIAGNOSIS — Z4802 Encounter for removal of sutures: Secondary | ICD-10-CM

## 2017-08-10 DIAGNOSIS — Z953 Presence of xenogenic heart valve: Secondary | ICD-10-CM

## 2017-08-10 DIAGNOSIS — I350 Nonrheumatic aortic (valve) stenosis: Secondary | ICD-10-CM

## 2017-08-10 LAB — BASIC METABOLIC PANEL
BUN / CREAT RATIO: 18 (ref 12–28)
BUN: 14 mg/dL (ref 8–27)
CALCIUM: 9.9 mg/dL (ref 8.7–10.3)
CHLORIDE: 103 mmol/L (ref 96–106)
CO2: 25 mmol/L (ref 20–29)
Creatinine, Ser: 0.8 mg/dL (ref 0.57–1.00)
GFR calc non Af Amer: 80 mL/min/{1.73_m2} (ref >59)
GFR, EST AFRICAN AMERICAN: 92 mL/min/{1.73_m2} (ref >59)
Glucose: 105 mg/dL — ABNORMAL HIGH (ref 65–99)
POTASSIUM: 4.8 mmol/L (ref 3.5–5.2)
SODIUM: 143 mmol/L (ref 134–144)

## 2017-08-10 LAB — CBC
Hematocrit: 35.2 % (ref 34.0–46.6)
Hemoglobin: 11.6 g/dL (ref 11.1–15.9)
MCH: 30.2 pg (ref 26.6–33.0)
MCHC: 33 g/dL (ref 31.5–35.7)
MCV: 92 fL (ref 79–97)
PLATELETS: 356 10*3/uL (ref 150–450)
RBC: 3.84 x10E6/uL (ref 3.77–5.28)
RDW: 13.4 % (ref 12.3–15.4)
WBC: 9 10*3/uL (ref 3.4–10.8)

## 2017-08-10 NOTE — Progress Notes (Signed)
Mrs. Miranda Berger returns for suture removal s/p MINI AVR on 07/26/17. She is doing  well at home. Appetite and bowels are good. She is using Tramadol for discomfort. She has residual bruising at the right breast. The two sutures were easily removed. She is concerned with a hardened are at the top of her right breast. This area has a small pimple and feels lie there is a hard "core" when applying pressure. She said it was tender. I suggested to leave it alone, but notify us if it becomes larger, drains or becomes red. Otherwise, she will return as scheduled with a CXR.

## 2017-08-15 ENCOUNTER — Telehealth (HOSPITAL_COMMUNITY): Payer: Self-pay

## 2017-08-15 NOTE — Telephone Encounter (Signed)
Patients insurance is active and benefits verified through [place] - No co-pay, deductible amount of $1,080/$1,080 has been met, no out of pocket, no co-insurance, and no pre-authorization is required. Reference [id number]  Will contact patient to see if she is interested in the Cardiac Rehab Program. If interested, patient will be contacted for scheduling upon review by the RN Navigator as follow up appt has been completed.

## 2017-08-16 ENCOUNTER — Other Ambulatory Visit: Payer: Self-pay | Admitting: Thoracic Surgery (Cardiothoracic Vascular Surgery)

## 2017-08-17 ENCOUNTER — Ambulatory Visit: Payer: BC Managed Care – PPO | Admitting: Neurology

## 2017-08-19 ENCOUNTER — Other Ambulatory Visit: Payer: Self-pay | Admitting: Thoracic Surgery (Cardiothoracic Vascular Surgery)

## 2017-08-19 DIAGNOSIS — Z953 Presence of xenogenic heart valve: Secondary | ICD-10-CM

## 2017-08-19 DIAGNOSIS — Z952 Presence of prosthetic heart valve: Secondary | ICD-10-CM

## 2017-08-22 ENCOUNTER — Ambulatory Visit: Payer: Self-pay

## 2017-08-23 ENCOUNTER — Ambulatory Visit (INDEPENDENT_AMBULATORY_CARE_PROVIDER_SITE_OTHER): Payer: Self-pay | Admitting: Physician Assistant

## 2017-08-23 ENCOUNTER — Ambulatory Visit
Admission: RE | Admit: 2017-08-23 | Discharge: 2017-08-23 | Disposition: A | Discharge status: Home / Self Care | Payer: BC Managed Care – PPO | Source: Ambulatory Visit | Attending: Thoracic Surgery (Cardiothoracic Vascular Surgery) | Admitting: Thoracic Surgery (Cardiothoracic Vascular Surgery)

## 2017-08-23 ENCOUNTER — Other Ambulatory Visit: Payer: Self-pay

## 2017-08-23 VITALS — BP 119/70 | HR 93 | Resp 18 | Ht 64.0 in | Wt 175.8 lb

## 2017-08-23 DIAGNOSIS — Z952 Presence of prosthetic heart valve: Secondary | ICD-10-CM

## 2017-08-23 DIAGNOSIS — Z953 Presence of xenogenic heart valve: Secondary | ICD-10-CM

## 2017-08-23 NOTE — Progress Notes (Signed)
HPI:   Patient returns for routine postoperative follow-up having undergone Minimally Invasive Aortic Valve Replacement on 07/26/2017.  The patient's early postoperative recovery while in the hospital was notable for hoarseness.  Since hospital discharge the patient reports that she is doing well.  She states that she is no longer taking Tramadol, but has noticed over the past several days that she has been experiencing more discomfort across her right breast area.  She describes the pain as being strange and burns somewhat.  She doesn't like her shirt to even touch it.  She had a hardened area that looked like a pimple along her right breast when she presented for suture removal.  This has resolved on the surface, but she can still feel a little hardened area under her breast.  She is walking without issue and has been really happy overall with the care she received and has been doing since surgery.   Current Outpatient Medications  Medication Sig Dispense Refill  . acetaminophen (TYLENOL) 500 MG tablet Take 1,000 mg by mouth every 6 (six) hours as needed.    [name]. albuterol (PROVENTIL HFA;VENTOLIN HFA) 108 (90 Base) MCG/ACT inhaler Inhale 2 puffs into the lungs every 6 (six) hours as needed for wheezing or shortness of breath.    . ALPRAZolam (XANAX) 0.25 MG tablet Take 0.25 mg by mouth 2 (two) times daily.     [name]. amoxicillin (AMOXIL) 500 MG capsule Take 2,000 mg (4 tablets) 45 minutes prior to dental procedures. 12 capsule 2  . aspirin EC 325 MG tablet Take 1 tablet (325 mg total) by mouth daily. 30 tablet 0  . atenolol (TENORMIN) 50 MG tablet Take 50 mg by mouth daily.    [name]. buPROPion (WELLBUTRIN XL) 150 MG 24 hr tablet Take 150 mg by mouth daily.    . calcium carbonate (OS-CAL) 600 MG TABS tablet Take 600 mg by mouth daily.     . fluticasone (FLONASE) 50 MCG/ACT nasal spray Place 1 spray into both nostrils daily as needed.     . Fluticasone-Salmeterol (ADVAIR) 250-50 MCG/DOSE AEPB Inhale 1 puff into the  lungs 2 (two) times daily.    . mirtazapine (REMERON) 45 MG tablet Take 45 mg by mouth at bedtime.    . Multiple Vitamin (MULTIVITAMIN) capsule Take 1 capsule by mouth daily.    [name]. omeprazole (PRILOSEC) 20 MG capsule Take 20 mg by mouth daily.     No current facility-administered medications for this visit.     Physical Exam:  BP 119/70 (BP Location: Left Arm, Patient Position: Sitting, Cuff Size: Normal)   Pulse 93   Resp 18   Ht 5\' 4"  (1.626 m)   Wt 175 lb 12.8 oz (79.7 kg)   SpO2 96% Comment: RA  BMI 30.18 kg/m   Gen: no apparent distress Heart: RRR Lungs: CTA bilaterally Ext: no edema present Incisions: well healed  Diagnostic Tests:  No pneumothorax or pleural effusions present, rib plating is noted along right chest  A/P:  1. S/P Mini AVR- doing well 2. Activity- patient instructed she may resume driving.  She was instructed to avoid strenuous activity along her right side for at least 6 weeks.  She was instructed to continue to increase her activity level and walking as tolerated.  She was given education on the importance of Endocarditis prophylaxis with dental procedures in the future 3. RTC around 9/6 with Dr. Charna Busmanwen  Erin Barrett, PA-C [facility] [phone]

## 2017-08-26 ENCOUNTER — Telehealth (HOSPITAL_COMMUNITY): Payer: Self-pay

## 2017-08-26 NOTE — Telephone Encounter (Signed)
Attempted to call patient in regards to Cardiac Rehab - LM on VM 

## 2017-08-30 ENCOUNTER — Telehealth (HOSPITAL_COMMUNITY): Payer: Self-pay

## 2017-08-30 NOTE — Telephone Encounter (Signed)
Called and spoke with patient to schedule Cardiac Rehab. Scheduled orientation on 10/06/17 at 8:30am. Patient will attend the 9:45am exc class. Mailed packet.

## 2017-09-01 ENCOUNTER — Telehealth: Payer: Self-pay

## 2017-09-01 ENCOUNTER — Other Ambulatory Visit: Payer: Self-pay | Admitting: Family Medicine

## 2017-09-01 ENCOUNTER — Ambulatory Visit
Admission: RE | Admit: 2017-09-01 | Discharge: 2017-09-01 | Disposition: A | Discharge status: Home / Self Care | Payer: BC Managed Care – PPO | Source: Ambulatory Visit | Attending: Family Medicine | Admitting: Family Medicine

## 2017-09-01 DIAGNOSIS — R0602 Shortness of breath: Secondary | ICD-10-CM

## 2017-09-01 MED ORDER — IOPAMIDOL (ISOVUE-370) INJECTION 76%
75.0000 mL | Freq: Once | INTRAVENOUS | Status: AC | PRN
  Administered 2017-09-01: 75 mL via INTRAVENOUS

## 2017-09-01 NOTE — Telephone Encounter (Signed)
Per Dr. Cornelius Moraswen, will notify patient if her PCP has not already.

## 2017-09-01 NOTE — Telephone Encounter (Signed)
-----   Message from Purcell Nailslarence H Owen, MD sent at 09/01/2017  3:04 PM EDT ----- Her CT scan looked fine.  No PE.  Trivial pericardial effusion.  Trivial left pleural effusion.  It looks good.  ----- Message ----- From: Steve RattlerBurgess, Ashley F, RN Sent: 09/01/2017  12:54 PM To: Purcell Nailslarence H Owen, MD  [facility] nurse from [facility] called to let you know that she was seen in Dr. Delanna AhmadiWesterman's office today with c/o Shortness of breath, Pain/ tightness in her left chest radiating to her back.  They did an EKG and have sent her to have a Chest CT Angio to r/o a PE.  They wanted to make you aware.   Thanks,  Morrie SheldonAshley

## 2017-09-11 NOTE — Progress Notes (Signed)
Cardiology Office Note:    Date:  09/12/2017   ID:  Miranda Berger, DOB 06/06/1955, MRN [id number]  PCP:  Maurice Small, MD  Cardiologist:  Shirlee More, MD    Referring MD: Maurice Small, MD    ASSESSMENT:    1. Other acute pericarditis    PLAN:    In order of problems listed above:  1. She is improved EKG shows no changes no rub on physical exam we will recheck sedimentation rate and CRP and if improved are normal will discontinue nonsteroidal anti-inflammatory drug.  I have offered her reassurance. 2. Hypertension she no longer requires treatment in my opinion if anything she is having symptoms of fatigue and low blood pressure at home will discontinue her beta-blocker after slow wean.   Next appointment: One month   Medication Adjustments/Labs and Tests Ordered: Current medicines are reviewed at length with the patient today.  Concerns regarding medicines are outlined above.  Orders Placed This Encounter  Procedures  . C-reactive protein  . Sed Rate (ESR)  . EKG 12-Lead   Meds ordered this encounter  Medications  . atenolol (TENORMIN) 25 MG tablet    Sig: Take 1 tablet (25 mg total) by mouth daily.    Dispense:  18 tablet    Refill:  0    Chief Complaint  Patient presents with  . Follow-up    recent pericarditis with elevated sed rate 65 mm/hr    History of Present Illness:    Miranda Berger is a 62 y.o. female with a hx of bicus[pid aortic valve, aortic stenosis and recent SAVR 07/26/2017 and new LBBB after surgery last seen 08/09/2017. She had pleuritic chest pain and CTA showed small left pleural and trace pericardial effusion. CBC and CMP were normal sed rate 65. Compliance with diet, lifestyle and medications: Yes  Overall doing well but she had an episode of pleuropericardial pain high sed rate trivial effusion on CT scan.  Despite the lack of rubbery EKG changes was consistent with post open heart surgery pericarditis the symptoms resolved on her NSAID will  recheck her sed rate and CRP today and hopefully be able to stop her nonsteroidal anti-inflammatory drug.  She still has some mild postoperative incisional pain not severe or limiting.  She has had no fever chills or drainage.  She notices blood pressure less than 100 feels fatigued and will decrease and discontinue her beta-blocker. Past Medical History:  Diagnosis Date  . Anemia    in the past   . Anxiety   . Asthma   . Depression   . Family history of adverse reaction to anesthesia    mom had nausea  . GERD (gastroesophageal reflux disease)   . Hepatitis    Hepatitis A in 3rd grade?  [name] Hypertension   . PONV (postoperative nausea and vomiting)   . 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 07/26/2017   [employer], size 23  . Sleep apnea    uses CPAP, 12, starts at 6    Past Surgical History:  Procedure Laterality Date  . ABDOMINAL HYSTERECTOMY    . AORTIC VALVE REPLACEMENT N/A 07/26/2017   Procedure: MINIMALLY INVASIVE AORTIC VALVE REPLACEMENT (AVR);  Surgeon: Rexene Alberts, MD;  Location: [facility];  Service: Open Heart Surgery;  Laterality: N/A;  . BLADDER SURGERY  2011  . CARPAL TUNNEL RELEASE  2012  . CHOLECYSTECTOMY    . RIGHT/LEFT HEART CATH AND CORONARY ANGIOGRAPHY N/A 07/08/2017  Procedure: RIGHT/LEFT HEART CATH AND CORONARY ANGIOGRAPHY;  Surgeon: Sherren Mocha, MD;  Location: [facility];  Service: Cardiovascular;  Laterality: N/A;  . TEE WITHOUT CARDIOVERSION N/A 07/26/2017   Procedure: TRANSESOPHAGEAL ECHOCARDIOGRAM (TEE);  Surgeon: Rexene Alberts, MD;  Location: [facility];  Service: Open Heart Surgery;  Laterality: N/A;  . TONSILLECTOMY      Current Medications: Current Meds  Medication Sig  . acetaminophen (TYLENOL) 500 MG tablet Take 1,000 mg by mouth every 6 (six) hours as needed.  [name] albuterol (PROVENTIL HFA;VENTOLIN HFA) 108 (90 Base) MCG/ACT inhaler Inhale 2 puffs into the lungs every 6 (six) hours as needed for  wheezing or shortness of breath.  . ALPRAZolam (XANAX) 0.25 MG tablet Take 0.25 mg by mouth 2 (two) times daily.   [name] amoxicillin (AMOXIL) 500 MG capsule Take 2,000 mg (4 tablets) 45 minutes prior to dental procedures.  [name] aspirin EC 325 MG tablet Take 1 tablet (325 mg total) by mouth daily. (Patient taking differently: Take 162 mg by mouth daily. )  . atenolol (TENORMIN) 25 MG tablet Take 1 tablet (25 mg total) by mouth daily.  [name] buPROPion (WELLBUTRIN XL) 150 MG 24 hr tablet Take 150 mg by mouth daily.  . calcium carbonate (OS-CAL) 600 MG TABS tablet Take 600 mg by mouth daily.   . fluticasone (FLONASE) 50 MCG/ACT nasal spray Place 1 spray into both nostrils daily as needed.   . Fluticasone-Salmeterol (ADVAIR) 250-50 MCG/DOSE AEPB Inhale 1 puff into the lungs 2 (two) times daily.  . mirtazapine (REMERON) 45 MG tablet Take 45 mg by mouth at bedtime.  . Multiple Vitamin (MULTIVITAMIN) capsule Take 1 capsule by mouth daily.  [name] omeprazole (PRILOSEC) 20 MG capsule Take 20 mg by mouth daily.  . [DISCONTINUED] atenolol (TENORMIN) 50 MG tablet Take 50 mg by mouth daily.     Allergies:   Codeine and Prednisone   Social History   Socioeconomic History  . Marital status: Married    Spouse name: Not on file  . Number of children: 1  . Years of education: BS  . Highest education level: Not on file  Occupational History  . Not on file  Social Needs  . Financial resource strain: Not on file  . Food insecurity:    Worry: Not on file    Inability: Not on file  . Transportation needs:    Medical: Not on file    Non-medical: Not on file  Tobacco Use  . Smoking status: Never Smoker  . Smokeless tobacco: Never Used  Substance and Sexual Activity  . Alcohol use: No    Alcohol/week: 0.0 oz  . Drug use: No  . Sexual activity: Not on file  Lifestyle  . Physical activity:    Days per week: Not on file    Minutes per session: Not on file  . Stress: Not on file  Relationships  . Social  connections:    Talks on phone: Not on file    Gets together: Not on file    Attends religious service: Not on file    Active member of club or organization: Not on file    Attends meetings of clubs or organizations: Not on file    Relationship status: Not on file  Other Topics Concern  . Not on file  Social History Narrative   Occasionally consumes tea or coffee     Family History: The patient's family history includes Heart Problems in her maternal aunt; Leukemia in her mother;  Sleep apnea in her father; Stroke in her father and mother; Valvular heart disease in her brother, maternal aunt, and paternal grandfather. ROS:   Please see the history of present illness.    All other systems reviewed and are negative.  EKGs/Labs/Other Studies Reviewed:    The following studies were reviewed today:  EKG:  EKG ordered today.  The ekg ordered today demonstrates sinus rhythm nonspecific T waves no EKG findings of pericarditis  Recent Labs: 07/22/2017: ALT 13 07/27/2017: Magnesium 2.2 08/09/2017: BUN 14; Creatinine, Ser 0.80; Hemoglobin 11.6; Platelets 356; Potassium 4.8; Sodium 143  Recent Lipid Panel No results found for: CHOL, TRIG, HDL, CHOLHDL, VLDL, LDLCALC, LDLDIRECT  Physical Exam:    VS:  BP 104/72 (BP Location: Right Arm, Patient Position: Sitting, Cuff Size: Normal)   Pulse 94   Ht _0  (1.626 m)   Wt 175 lb (79.4 kg)   SpO2 99%   BMI 30.04 kg/m     Wt Readings from Last 3 Encounters:  09/12/17 175 lb (79.4 kg)  08/23/17 175 lb 12.8 oz (79.7 kg)  08/09/17 173 lb 12.8 oz (78.8 kg)     GEN:  Well nourished, well developed in no acute distress HEENT: Normal NECK: No JVD; No carotid bruits LYMPHATICS: No lymphadenopathy CARDIAC: No pericardial rub or murmur RRR, no murmurs, rubs, gallops RESPIRATORY:  Clear to auscultation without rales, wheezing or rhonchi  ABDOMEN: Soft, non-tender, non-distended MUSCULOSKELETAL:  No edema; No deformity  SKIN: Warm and  dry NEUROLOGIC:  Alert and oriented x 3 PSYCHIATRIC:  Normal affect    Signed, Shirlee More, MD  09/12/2017 2:43 PM    [place]

## 2017-09-12 ENCOUNTER — Encounter: Payer: Self-pay | Admitting: Cardiology

## 2017-09-12 ENCOUNTER — Ambulatory Visit: Payer: BC Managed Care – PPO | Admitting: Cardiology

## 2017-09-12 DIAGNOSIS — I308 Other forms of acute pericarditis: Secondary | ICD-10-CM

## 2017-09-12 DIAGNOSIS — I319 Disease of pericardium, unspecified: Secondary | ICD-10-CM | POA: Insufficient documentation

## 2017-09-12 HISTORY — DX: Disease of pericardium, unspecified: I31.9

## 2017-09-12 MED ORDER — ATENOLOL 25 MG PO TABS: 25.0000 mg | ORAL_TABLET | Freq: Every day | ORAL | 0 refills | Status: DC

## 2017-09-12 NOTE — Patient Instructions (Signed)
Medication Instructions:  Your physician has recommended you make the following change in your medication:   DECREASE: Atenolol 30m daily for 2 weeks. Then take 1 tablet every other day for 1 week. Then STOP.   Labwork: Your physician recommends that you return for lab work today: Sed rate, CRP.  Testing/Procedures: None.  Follow-Up: Your physician wants you to follow-up in: 4 months. You will receive a reminder letter in the mail two months in advance. If you don't receive a letter, please call our office to schedule the follow-up appointment.   Any Other Special Instructions Will Be Listed Below (If Applicable).     If you need a refill on your cardiac medications before your next appointment, please call your pharmacy.  Erythrocyte Sedimentation Rate Why am I having this test? The erythrocyte sedimentation rate (ESR) test is used to find illnesses associated with:  Acute and chronic infection.  Inflammation.  Noncancerous abnormal tissue growth.  Tissue death.  This test measures how long it takes for your red blood cells to settle in a saline or plasma solution over a specified amount of time. If you have vague symptoms associated with these illnesses, your health care provider may do this test before doing more specific tests. This test can also be used to help monitor therapy if you have an inflammatory immune disease, such as rheumatoid arthritis. What kind of sample is taken? A blood sample is required for this test. It is usually collected by inserting a needle into a vein. How do I prepare for this test? There is no preparation required for this test. Your health care provider will tell you if you need to stop any of your medicines before the test. Some medicines can affect the test results. What are the reference values? Reference values are considered healthy values established after testing a large group of healthy people. Reference values may vary among  different people, labs, and hospitals. It is your responsibility to obtain your test results. Ask the lab or department performing the test when and how you will get your results. Reference values using the Westergren method are the following:  Female: up to 15 mm/hr.  Female:up to 20 mm/hr.  Child: up to 10 mm/hr.  Newborn: 0-2 mm/hr.  The ESR results depend on which test the lab uses. What do the results mean? Increased ESR levels may indicate:  Kidney failure.  Certain types of cancer.  Inflammatory diseases.  Tissue death or damage.  Severe anemia.  Certain diseases may cause ESR levels to be falsely decreased. Talk with your health care provider to discuss your results, treatment options, and if necessary, the need for more tests. Talk with your health care provider if you have any questions about your results. Talk with your health care provider to discuss your results, treatment options, and if necessary, the need for more tests. Talk with your health care provider if you have any questions about your results. This information is not intended to replace advice given to you by your health care provider. Make sure you discuss any questions you have with your health care provider. Document Released: 03/03/2004 Document Revised: 10/14/2015 Document Reviewed: 07/06/2013 Elsevier Interactive Patient Education  2018 [place]  C-Reactive Protein Test C-reactive protein (CRP) is a substance released by the liver in response to inflammation. The level of CRP in your body increases greatly after a heart attack, an infection, or an injury. High levels of CRP in your body can indicate an infection. This  can help your health care provider determine whether you have a serious bacterial or fungal infection. Elevated levels of CRP can also help health care providers identify conditions that can be difficult to diagnose. These include:  Cancer.  Arthritis.  Inflammatory bowel  disease.  Lupus or other autoimmune diseases.  A high-sensitivity C-reactive protein (hs-CRP) assay is a more sensitive test. It is used to measure your risk for heart disease. The test for CRP is a blood test. It requires a blood sample from a vein in your arm. What do the results mean? It is your responsibility to obtain your test results. Ask the lab or department performing the test when and how you will get your results. Contact your health care provider to discuss any questions you have about your results. The results of a CRP test will be a range of values. CRP levels that are above the normal range may indicate a problem. Range of Normal Values Ranges for normal values vary among different labs and hospitals. You should always check with your health care provider after having lab work or other tests done to discuss whether your values are considered within normal limits. For a standard CRP test, the normal value is zero. The standard CRP test usually measures levels of CRP from 10 to 1,000 milligrams per liter (mg/L). The hs-CRP can detect CRP levels between 0.5 and 10 mg/L.  A level of 1.0 mg/L and lower may indicate a low risk for heart disease.  A level between 1.0 and 3.0 mg/L may represent an average risk for heart disease.  A level of 3.0 mg/L or higher may indicate a high risk for heart disease.  Meaning of Results Outside Normal Range Values A high CRP level only indicates that there is inflammation. This can be caused by many different conditions, includinginfection, arthritis, and autoimmune diseases. It can also be a marker for cancer, a heart attack, or internal injury. Health care providers also use other tests and consider your signs and symptoms when diagnosing your condition. Talk with your health care provider to discuss your results, treatment options, and if necessary, the need for more tests. Talk with your health care provider if you have any questions about your  results. This information is not intended to replace advice given to you by your health care provider. Make sure you discuss any questions you have with your health care provider. Document Released: 03/03/2004 Document Revised: 10/14/2015 Document Reviewed: 05/08/2013 Elsevier Interactive Patient Education  [employer].

## 2017-09-13 LAB — C-REACTIVE PROTEIN: CRP: 44 mg/L — ABNORMAL HIGH (ref 0–10)

## 2017-09-13 LAB — SEDIMENTATION RATE: Sed Rate: 43 mm/hr — ABNORMAL HIGH (ref 0–40)

## 2017-09-14 ENCOUNTER — Telehealth: Payer: Self-pay

## 2017-09-14 DIAGNOSIS — I3139 Other pericardial effusion (noninflammatory): Secondary | ICD-10-CM

## 2017-09-14 DIAGNOSIS — I313 Pericardial effusion (noninflammatory): Secondary | ICD-10-CM

## 2017-09-14 NOTE — Telephone Encounter (Signed)
Left message for patient to return call for lab results, and to notify her of echocardiogram appointment date and time.

## 2017-09-26 ENCOUNTER — Telehealth: Payer: Self-pay | Admitting: Cardiology

## 2017-09-26 NOTE — Telephone Encounter (Signed)
Left message for patient's husband, Onalee HuaDavid, to return [facility].

## 2017-09-26 NOTE — Telephone Encounter (Signed)
Miranda PocheDavid Neidlinger Spouse [phone]  Onalee HuaDavid called to say that Curlew [name] is in pain all the time and she is very concerned about having test tomorrow and then again next week, he feels like she needs to be seen and someone go over and explain everything to them.

## 2017-09-27 ENCOUNTER — Other Ambulatory Visit: Payer: Self-pay | Admitting: *Deleted

## 2017-09-27 ENCOUNTER — Ambulatory Visit (HOSPITAL_BASED_OUTPATIENT_CLINIC_OR_DEPARTMENT_OTHER)
Admission: RE | Admit: 2017-09-27 | Discharge: 2017-09-27 | Disposition: A | Discharge status: Home / Self Care | Payer: BC Managed Care – PPO | Source: Ambulatory Visit | Attending: Cardiology | Admitting: Cardiology

## 2017-09-27 DIAGNOSIS — I313 Pericardial effusion (noninflammatory): Secondary | ICD-10-CM | POA: Diagnosis not present

## 2017-09-27 DIAGNOSIS — I447 Left bundle-branch block, unspecified: Secondary | ICD-10-CM | POA: Insufficient documentation

## 2017-09-27 DIAGNOSIS — Z953 Presence of xenogenic heart valve: Secondary | ICD-10-CM | POA: Insufficient documentation

## 2017-09-27 DIAGNOSIS — I340 Nonrheumatic mitral (valve) insufficiency: Secondary | ICD-10-CM | POA: Insufficient documentation

## 2017-09-27 DIAGNOSIS — I119 Hypertensive heart disease without heart failure: Secondary | ICD-10-CM | POA: Diagnosis not present

## 2017-09-27 DIAGNOSIS — R7982 Elevated C-reactive protein (CRP): Secondary | ICD-10-CM

## 2017-09-27 NOTE — Telephone Encounter (Signed)
Patient's husband, Onalee HuaDavid, needed clarification of plan of care for patient. Per DPR explained to Onalee HuaDavid that an echocardiogram is being done today due to chest pain and as protocol after having surgery per Dr. Dulce SellarMunley. Patient was started on mobic after having an elevated c-reactive protein level indicating inflammation. Advised per Dr. Dulce SellarMunley, that after echocardiogram is completed downstairs, to come to our office to have a repeat CRP drawn. Onalee HuaDavid verbalized understanding. No further questions.

## 2017-09-27 NOTE — Telephone Encounter (Signed)
Patient's husband, Onalee HuaDavid returned your call. Please call back at [phone], they will be in building for testing at 10:30 also.

## 2017-09-27 NOTE — Progress Notes (Signed)
  Echocardiogram 2D Echocardiogram has been performed.  Edmund Rick T Arjun Hard 09/27/2017, 11:32 AM

## 2017-09-28 ENCOUNTER — Telehealth (HOSPITAL_COMMUNITY): Payer: Self-pay | Admitting: Pharmacist

## 2017-09-28 LAB — C-REACTIVE PROTEIN: CRP: 96 mg/L — ABNORMAL HIGH (ref 0–10)

## 2017-09-28 NOTE — Telephone Encounter (Signed)
Cardiac Rehab Medication Review by a Pharmacist  Does the patient  feel that his/her medications are working for him/her?  yes  Has the patient been experiencing any side effects to the medications prescribed?  yes, patient has experienced hypotension with atenolol. At times, notes she is feeling dizziness upon positional changes  Does the patient measure his/her own blood pressure or blood glucose at home?  yes, BP runs ~110/70  Does the patient have any problems obtaining medications due to transportation or finances?   no  Understanding of regimen: good Understanding of indications: good Potential of compliance: good    Pharmacist comments: Patient has good understanding of medication regimen. Concerns for orthostatic hypotension.  Wendelyn Breslowylan Lyall Faciane, PharmD PGY1 Pharmacy Resident Phone: [phone] 09/28/2017 3:56 PM

## 2017-10-06 ENCOUNTER — Encounter (HOSPITAL_COMMUNITY): Payer: Self-pay

## 2017-10-06 ENCOUNTER — Encounter (HOSPITAL_COMMUNITY)
Admission: RE | Admit: 2017-10-06 | Discharge: 2017-10-06 | Disposition: A | Discharge status: Home / Self Care | Payer: BC Managed Care – PPO | Source: Ambulatory Visit | Attending: Cardiology | Admitting: Cardiology

## 2017-10-06 VITALS — BP 124/72 | HR 92 | Ht 64.0 in | Wt 177.9 lb

## 2017-10-06 DIAGNOSIS — Z952 Presence of prosthetic heart valve: Secondary | ICD-10-CM

## 2017-10-06 NOTE — Progress Notes (Signed)
Cardiac Individual Treatment Plan  Patient Details  Name: Miranda Berger MRN: [id number] Date of Birth: 1955/10/14 Referring Provider:     CARDIAC REHAB PHASE II ORIENTATION from 10/06/2017 in [facility] CARDIAC [facility]  Referring Provider  Norman Herrlich Md      Initial Encounter Date:    CARDIAC REHAB PHASE II ORIENTATION from 10/06/2017 in [facility] CARDIAC REHAB  Date  10/06/17      Visit Diagnosis: S/P AVR (aortic valve replacement) S/P minimally invasive 07/26/17  Patient's Home Medications on Admission:  Current Outpatient Medications:  .  acetaminophen (TYLENOL) 500 MG tablet, Take 1,000 mg by mouth every 6 (six) hours as needed., Disp: , Rfl:  .  albuterol (PROVENTIL HFA;VENTOLIN HFA) 108 (90 Base) MCG/ACT inhaler, Inhale 2 puffs into the lungs every 6 (six) hours as needed for wheezing or shortness of breath., Disp: , Rfl:  .  ALPRAZolam (XANAX) 0.25 MG tablet, Take 0.25 mg by mouth 2 (two) times daily. , Disp: , Rfl:  .  amoxicillin (AMOXIL) 500 MG capsule, Take 2,000 mg (4 tablets) 45 minutes prior to dental procedures., Disp: 12 capsule, Rfl: 2 .  aspirin EC 325 MG tablet, Take 1 tablet (325 mg total) by mouth daily. (Patient taking differently: Take 162 mg by mouth daily. ), Disp: 30 tablet, Rfl: 0 .  atenolol (TENORMIN) 25 MG tablet, Take 25 mg by mouth every other day., Disp: , Rfl:  .  buPROPion (WELLBUTRIN XL) 150 MG 24 hr tablet, Take 150 mg by mouth daily., Disp: , Rfl:  .  calcium carbonate (OS-CAL) 600 MG TABS tablet, Take 600 mg by mouth daily. , Disp: , Rfl:  .  cyclobenzaprine (FLEXERIL) 10 MG tablet, TK 1 T PO QD HS PRN MUSCLE TIGHTNESS, Disp: , Rfl: 1 .  fluticasone (FLONASE) 50 MCG/ACT nasal spray, Place 1 spray into both nostrils daily as needed. , Disp: , Rfl:  .  Fluticasone-Salmeterol (ADVAIR) 250-50 MCG/DOSE AEPB, Inhale 1 puff into the lungs 2 (two) times daily., Disp: , Rfl:  .  meloxicam (MOBIC) 15 MG tablet, TK 1 T PO QD  IN THE MORNING WF, Disp: , Rfl: 1 .  mirtazapine (REMERON) 45 MG tablet, Take 45 mg by mouth at bedtime., Disp: , Rfl:  .  Multiple Vitamin (MULTIVITAMIN) capsule, Take 1 capsule by mouth daily., Disp: , Rfl:  .  omeprazole (PRILOSEC) 20 MG capsule, Take 20 mg by mouth daily., Disp: , Rfl:   Past Medical History: Past Medical History:  Diagnosis Date  . Anemia    in the past   . Anxiety   . Asthma   . Depression   . Family history of adverse reaction to anesthesia    mom had nausea  . GERD (gastroesophageal reflux disease)   . Hepatitis    Hepatitis A in 3rd grade?  [name] Hypertension   . PONV (postoperative nausea and vomiting)   . 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 Sleep apnea    uses CPAP, 12, starts at 6    Tobacco Use: Social History   Tobacco Use  Smoking Status Never Smoker  Smokeless Tobacco Never Used    Labs: Recent Review Flowsheet Data    Labs for ITP Cardiac and Pulmonary Rehab Latest Ref Rng & Units 07/26/2017 07/26/2017 07/26/2017 07/27/2017 07/27/2017   Hemoglobin A1c 4.8 - 5.6 % - - - - -   PHART 7.350 -  7.450 7.330(L) - 7.293(L) 7.324(L) -   PCO2ART 32.0 - 48.0 mmHg 41.2 - 44.7 42.7 -   HCO3 20.0 - 28.0 mmol/L 21.9 - 21.8 22.3 -   TCO2 22 - 32 mmol/L 23 22 23 24 23    ACIDBASEDEF 0.0 - 2.0 mmol/L 4.0(H) - 5.0(H) 4.0(H) -   O2SAT % 99.0 - 99.0 97.0 -      Capillary Blood Glucose: Lab Results  Component Value Date   GLUCAP 104 (H) 07/28/2017   GLUCAP 100 (H) 07/28/2017   GLUCAP 148 (H) 07/27/2017   GLUCAP 130 (H) 07/27/2017   GLUCAP 123 (H) 07/27/2017     Exercise Target Goals: Exercise Program Goal: Individual exercise prescription set using results from initial 6 min walk test and THRR while considering  patient's activity barriers and safety.   Exercise Prescription Goal: Initial exercise prescription builds to 30-45 minutes a day of aerobic activity, 2-3 days per  week.  Home exercise guidelines will be given to patient during program as part of exercise prescription that the participant will acknowledge.  Activity Barriers & Risk Stratification: Activity Barriers & Cardiac Risk Stratification - 10/06/17 1225      Activity Barriers & Cardiac Risk Stratification   Activity Barriers  Arthritis;Deconditioning;Muscular Weakness;Other (comment)    Comments  hip discomfort B (R>L) R knee discomfort     Cardiac Risk Stratification  High       6 Minute Walk: 6 Minute Walk    Row Name 10/06/17 1224         6 Minute Walk   Phase  Initial     Distance  1693 feet     Walk Time  6 minutes     # of Rest Breaks  0     MPH  3.2     METS  4.1     RPE  12     Symptoms  No     Resting HR  92 bpm     Resting BP  124/72     Resting Oxygen Saturation   97 %     Exercise Oxygen Saturation  during 6 min walk  98 %     Max Ex. HR  129 bpm     Max Ex. BP  126/80     2 Minute Post BP  128/80        Oxygen Initial Assessment:   Oxygen Re-Evaluation:   Oxygen Discharge (Final Oxygen Re-Evaluation):   Initial Exercise Prescription: Initial Exercise Prescription - 10/06/17 1200      Date of Initial Exercise RX and Referring Provider   Date  10/06/17    Referring Provider  Norman HerrlichMunley, Brian Md      Treadmill   MPH  2.8    Grade  1    Minutes  10    METs  3.53      Bike   Level  2.5   upright scifit   Minutes  10    METs  3      NuStep   Level  3    SPM  80    Minutes  10    METs  2      Prescription Details   Frequency (times per week)  3    Duration  Progress to 30 minutes of continuous aerobic without signs/symptoms of physical distress      Intensity   THRR 40-80% of Max Heartrate  64-127    Ratings of Perceived Exertion  11-13  Perceived Dyspnea  0-4      Progression   Progression  Continue to progress workloads to maintain intensity without signs/symptoms of physical distress.      Resistance Training   Training  Prescription  Yes    Weight  2lbs    Reps  10-15       Perform Capillary Blood Glucose checks as needed.  Exercise Prescription Changes:   Exercise Comments:   Exercise Goals and Review: Exercise Goals    Row Name 10/06/17 0909             Exercise Goals   Increase Physical Activity  Yes       Intervention  Provide advice, education, support and counseling about physical activity/exercise needs.;Develop an individualized exercise prescription for aerobic and resistive training based on initial evaluation findings, risk stratification, comorbidities and participant's personal goals.       Expected Outcomes  Short Term: Attend rehab on a regular basis to increase amount of physical activity.;Long Term: Exercising regularly at least 3-5 days a week.;Long Term: Add in home exercise to make exercise part of routine and to increase amount of physical activity.       Increase Strength and Stamina  Yes be able to walk without fear of having another cardiac event and without knee pain       Intervention  Provide advice, education, support and counseling about physical activity/exercise needs.;Develop an individualized exercise prescription for aerobic and resistive training based on initial evaluation findings, risk stratification, comorbidities and participant's personal goals.       Expected Outcomes  Short Term: Increase workloads from initial exercise prescription for resistance, speed, and METs.;Short Term: Perform resistance training exercises routinely during rehab and add in resistance training at home;Long Term: Improve cardiorespiratory fitness, muscular endurance and strength as measured by increased METs and functional capacity ( )       Able to understand and use rate of perceived exertion (RPE) scale  Yes       Intervention  Provide education and explanation on how to use RPE scale       Expected Outcomes  Short Term: Able to use RPE daily in rehab to express subjective  intensity level;Long Term:  Able to use RPE to guide intensity level when exercising independently       Knowledge and understanding of Target Heart Rate Range (THRR)  Yes       Intervention  Provide education and explanation of THRR including how the numbers were predicted and where they are located for reference       Expected Outcomes  Short Term: Able to state/look up THRR;Long Term: Able to use THRR to govern intensity when exercising independently;Short Term: Able to use daily as guideline for intensity in rehab       Able to check pulse independently  Yes       Intervention  Provide education and demonstration on how to check pulse in carotid and radial arteries.;Review the importance of being able to check your own pulse for safety during independent exercise       Expected Outcomes  Short Term: Able to explain why pulse checking is important during independent exercise;Long Term: Able to check pulse independently and accurately       Understanding of Exercise Prescription  Yes       Intervention  Provide education, explanation, and written materials on patient's individual exercise prescription       Expected Outcomes  Short Term: Able to explain program  exercise prescription;Long Term: Able to explain home exercise prescription to exercise independently          Exercise Goals Re-Evaluation :   Discharge Exercise Prescription (Final Exercise Prescription Changes):   Nutrition:  Target Goals: Understanding of nutrition guidelines, daily intake of sodium 1500mg , cholesterol 200mg , calories 30% from fat and 7% or less from saturated fats, daily to have 5 or more servings of fruits and vegetables.  Biometrics: Pre Biometrics - 10/06/17 1226      Pre Biometrics   Height  5\' 4"  (1.626 m)    Weight  80.7 kg    Waist Circumference  37 inches    Hip Circumference  43 inches    Waist to Hip Ratio  0.86 %    BMI (Calculated)  30.52    Triceps Skinfold  40 mm    % Body Fat  42.6 %     Grip Strength  30 kg    Flexibility  13 in    Single Leg Stand  30 seconds        Nutrition Therapy Plan and Nutrition Goals: Nutrition Therapy & Goals - 10/06/17 1053      Nutrition Therapy   Diet  heart healthy      Personal Nutrition Goals   Nutrition Goal  Pt to identify and limit food sources of saturated fat, trans fat, refined carbohydrates.    Personal Goal #2  Pt to identify food quantities necessary to achieve weight loss of 6-24 lbs. at graduation from cardiac rehab.      Intervention Plan   Intervention  Prescribe, educate and counsel regarding individualized specific dietary modifications aiming towards targeted core components such as weight, hypertension, lipid management, diabetes, heart failure and other comorbidities.    Expected Outcomes  Short Term Goal: Understand basic principles of dietary content, such as calories, fat, sodium, cholesterol and nutrients.       Nutrition Assessments: Nutrition Assessments - 10/06/17 1054      MEDFICTS Scores   Pre Score  18       Nutrition Goals Re-Evaluation: Nutrition Goals Re-Evaluation    Row Name 10/06/17 1053             Goals   Current Weight  177 lb 14.6 oz (80.7 kg)          Nutrition Goals Re-Evaluation: Nutrition Goals Re-Evaluation    Row Name 10/06/17 1053             Goals   Current Weight  177 lb 14.6 oz (80.7 kg)          Nutrition Goals Discharge (Final Nutrition Goals Re-Evaluation): Nutrition Goals Re-Evaluation - 10/06/17 1053      Goals   Current Weight  177 lb 14.6 oz (80.7 kg)       Psychosocial: Target Goals: Acknowledge presence or absence of significant depression and/or stress, maximize coping skills, provide positive support system. Participant is able to verbalize types and ability to use techniques and skills needed for reducing stress and depression.  Initial Review & Psychosocial Screening: Initial Psych Review & Screening - 10/06/17 1628      Initial  Review   Current issues with  None Identified      Family Dynamics   Good Support System?  Yes   Loda has her husband, several friends and family members for support     Barriers   Psychosocial barriers to participate in program  The patient should benefit from training in stress management  and relaxation.      Screening Interventions   Interventions  Encouraged to exercise;Provide feedback about the scores to participant;To provide support and resources with identified psychosocial needs    Expected Outcomes  Short Term goal: Utilizing psychosocial counselor, staff and physician to assist with identification of specific Stressors or current issues interfering with healing process. Setting desired goal for each stressor or current issue identified.;Long Term Goal: Stressors or current issues are controlled or eliminated.;Short Term goal: Identification and review with participant of any Quality of Life or Depression concerns found by scoring the questionnaire.;Long Term goal: The participant improves quality of Life and PHQ9 Scores as seen by post scores and/or verbalization of changes       Quality of Life Scores: Quality of Life - 10/06/17 1234      Quality of Life   Select  Quality of Life      Quality of Life Scores   Health/Function Pre  22 %    Socioeconomic Pre  30 %    Psych/Spiritual Pre  26.57 %    Family Pre  20.4 %    GLOBAL Pre  24.35 %      Scores of 19 and below usually indicate a poorer quality of life in these areas.  A difference of  2-3 points is a clinically meaningful difference.  A difference of 2-3 points in the total score of the Quality of Life Index has been associated with significant improvement in overall quality of life, self-image, physical symptoms, and general health in studies assessing change in quality of life.  PHQ-9: Recent Review Flowsheet Data    There is no flowsheet data to display.     Interpretation of Total Score  Total Score  Depression Severity:  1-4 = Minimal depression, 5-9 = Mild depression, 10-14 = Moderate depression, 15-19 = Moderately severe depression, 20-27 = Severe depression   Psychosocial Evaluation and Intervention:   Psychosocial Re-Evaluation:   Psychosocial Discharge (Final Psychosocial Re-Evaluation):   Vocational Rehabilitation: Provide vocational rehab assistance to qualifying candidates.   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 Participant will state understanding/return demonstration of topics presented.  Learning Barriers/Preferences: Learning Barriers/Preferences - 10/06/17 1630      Learning Barriers/Preferences   Learning Barriers  Sight   [place] S[place] wears contacts   Clinical cytogeneticist Material;Video       Education Topics: Count Your Pulse:  -Group instruction provided by verbal instruction, demonstration, patient participation and written materials to support subject.  Instructors address importance of being able to find your pulse and how to count your pulse when at home without a heart monitor.  Patients get hands on experience counting their pulse with staff help and individually.   Heart Attack, Angina, and Risk Factor Modification:  -Group instruction provided by verbal instruction, video, and written materials to support subject.  Instructors address signs and symptoms of angina and heart attacks.    Also discuss risk factors for heart disease and how to make changes to improve heart health risk factors.   Functional Fitness:  -Group instruction provided by verbal instruction, demonstration, patient  participation, and written materials to  support subject.  Instructors address safety measures for doing things around the house.  Discuss how to get up and down off the floor, how to pick things up properly, how to safely get out of a chair without assistance, and balance training.   Meditation and Mindfulness:  -Group instruction provided by verbal instruction, patient participation, and written materials to support subject.  Instructor addresses importance of mindfulness and meditation practice to help reduce stress and improve awareness.  Instructor also leads participants through a meditation exercise.    Stretching for Flexibility and Mobility:  -Group instruction provided by verbal instruction, patient participation, and written materials to support subject.  Instructors lead participants through series of stretches that are designed to increase flexibility thus improving mobility.  These stretches are additional exercise for major muscle groups that are typically performed during regular warm up and cool down.   Hands Only CPR:  -Group verbal, video, and participation provides a basic overview of AHA guidelines for community CPR. Role-play of emergencies allow participants the opportunity to practice calling for help and chest compression technique with discussion of AED use.   Hypertension: -Group verbal and written instruction that provides a basic overview of hypertension including the most recent diagnostic guidelines, risk factor reduction with self-care instructions and medication management.    Nutrition I class: Heart Healthy Eating:  -Group instruction provided by PowerPoint slides, verbal discussion, and written materials to support subject matter. The instructor gives an explanation and review of the Therapeutic Lifestyle Changes diet recommendations, which includes a discussion on lipid goals, dietary fat, sodium, fiber, plant stanol/sterol esters, sugar, and the components of  a well-balanced, healthy diet.   Nutrition II class: Lifestyle Skills:  -Group instruction provided by PowerPoint slides, verbal discussion, and written materials to support subject matter. The instructor gives an explanation and review of label reading, grocery shopping for heart health, heart healthy recipe modifications, and ways to make healthier choices when eating out.   Diabetes Question & Answer:  -Group instruction provided by PowerPoint slides, verbal discussion, and written materials to support subject matter. The instructor gives an explanation and review of diabetes co-morbidities, pre- and post-prandial blood glucose goals, pre-exercise blood glucose goals, signs, symptoms, and treatment of hypoglycemia and hyperglycemia, and foot care basics.   Diabetes Blitz:  -Group instruction provided by PowerPoint slides, verbal discussion, and written materials to support subject matter. The instructor gives an explanation and review of the physiology behind type 1 and type 2 diabetes, diabetes medications and rational behind using different medications, pre- and post-prandial blood glucose recommendations and Hemoglobin A1c goals, diabetes diet, and exercise including blood glucose guidelines for exercising safely.    Portion Distortion:  -Group instruction provided by PowerPoint slides, verbal discussion, written materials, and food models to support subject matter. The instructor gives an explanation of serving size versus portion size, changes in portions sizes over the last 20 years, and what consists of a serving from each food group.   Stress Management:  -Group instruction provided by verbal instruction, video, and written materials to support subject matter.  Instructors review role of stress in heart disease and how to cope with stress positively.     Exercising on Your Own:  -Group instruction provided by verbal instruction, power point, and written materials to support  subject.  Instructors discuss benefits of exercise, components of exercise, frequency and intensity of exercise, and end points for exercise.  Also discuss use of nitroglycerin and activating EMS.  Review options of places  to exercise outside of rehab.  Review guidelines for sex with heart disease.   Cardiac Drugs I:  -Group instruction provided by verbal instruction and written materials to support subject.  Instructor reviews cardiac drug classes: antiplatelets, anticoagulants, beta blockers, and statins.  Instructor discusses reasons, side effects, and lifestyle considerations for each drug class.   Cardiac Drugs II:  -Group instruction provided by verbal instruction and written materials to support subject.  Instructor reviews cardiac drug classes: angiotensin converting enzyme inhibitors (ACE-I), angiotensin II receptor blockers (ARBs), nitrates, and calcium channel blockers.  Instructor discusses reasons, side effects, and lifestyle considerations for each drug class.   Anatomy and Physiology of the Circulatory System:  Group verbal and written instruction and models provide basic cardiac anatomy and physiology, with the coronary electrical and arterial systems. Review of: AMI, Angina, Valve disease, Heart Failure, Peripheral Artery Disease, Cardiac Arrhythmia, Pacemakers, and the ICD.   Other Education:  -Group or individual verbal, written, or video instructions that support the educational goals of the cardiac rehab program.   Holiday Eating Survival Tips:  -Group instruction provided by PowerPoint slides, verbal discussion, and written materials to support subject matter. The instructor gives patients tips, tricks, and techniques to help them not only survive but enjoy the holidays despite the onslaught of food that accompanies the holidays.   Knowledge Questionnaire Score: Knowledge Questionnaire Score - 10/06/17 1242      Knowledge Questionnaire Score   Pre Score  21/24        Core Components/Risk Factors/Patient Goals at Admission: Personal Goals and Risk Factors at Admission - 10/06/17 1635      Core Components/Risk Factors/Patient Goals on Admission    Weight Management  Weight Maintenance;Weight Loss;Yes    Intervention  Weight Management: Develop a combined nutrition and exercise program designed to reach desired caloric intake, while maintaining appropriate intake of nutrient and fiber, sodium and fats, and appropriate energy expenditure required for the weight goal.;Weight Management: Provide education and appropriate resources to help participant work on and attain dietary goals.;Weight Management/Obesity: Establish reasonable short term and long term weight goals.    Goal Weight: Short Term  150 lb (68 kg)    Goal Weight: Long Term  145 lb (65.8 kg)    Expected Outcomes  Short Term: Continue to assess and modify interventions until short term weight is achieved;Weight Maintenance: Understanding of the daily nutrition guidelines, which includes 25-35% calories from fat, 7% or less cal from saturated fats, less than 200mg  cholesterol, less than 1.5gm of sodium, & 5 or more servings of fruits and vegetables daily;Weight Loss: Understanding of general recommendations for a balanced deficit meal plan, which promotes 1-2 lb weight loss per week and includes a negative energy balance of [phone] kcal/d;Understanding recommendations for meals to include 15-35% energy as protein, 25-35% energy from fat, 35-60% energy from carbohydrates, less than 200mg  of dietary cholesterol, 20-35 gm of total fiber daily;Understanding of distribution of calorie intake throughout the day with the consumption of 4-5 meals/snacks    Hypertension  Yes    Intervention  Provide education on lifestyle modifcations including regular physical activity/exercise, weight management, moderate sodium restriction and increased consumption of fresh fruit, vegetables, and low fat dairy, alcohol  moderation, and smoking cessation.;Monitor prescription use compliance.    Expected Outcomes  Short Term: Continued assessment and intervention until BP is < 140/70mm HG in hypertensive participants. < 130/46mm HG in hypertensive participants with diabetes, heart failure or chronic kidney disease.;Long Term: Maintenance of blood pressure at  goal levels.    Stress  Yes    Intervention  Offer individual and/or small group education and counseling on adjustment to heart disease, stress management and health-related lifestyle change. Teach and support self-help strategies.    Expected Outcomes  Short Term: Participant demonstrates changes in health-related behavior, relaxation and other stress management skills, ability to obtain effective social support, and compliance with psychotropic medications if prescribed.;Long Term: Emotional wellbeing is indicated by absence of clinically significant psychosocial distress or social isolation.       Core Components/Risk Factors/Patient Goals Review:    Core Components/Risk Factors/Patient Goals at Discharge (Final Review):    ITP Comments: ITP Comments    Row Name 10/06/17 1223           ITP Comments  Dr. Armanda Magicraci Turner, Medical Director          Comments: [place] [name] attended orientation from [phone] to [phone] to review rules and guidelines for program. Completed 6 minute walk test, Intitial ITP, and exercise prescription.  VSS. Telemetry-Sinus Rhythm, Sinus Tachycardia.  Asymptomatic. See previous progress note for further details.[place] LighterMaria Jeanette Moffatt, RN,BSN 10/06/2017 4:43 PM

## 2017-10-06 NOTE — Progress Notes (Signed)
[place] [name] was here this morning for orientation for cardiac rehab. [place] [name] was noted to be in Sinus Tach with a rate of 105-112. Patient asymptomatic. Verina's heart rate came down after sitting down to rest into the 90's. Room air oxygen saturation 97%. Jimena's max heart rate was noted at 129. [place] [name] said that [facility] told her to stop taking her atenolol due to problems with hypotension. [facility] and [facility].Will fax exercise flow sheets to Dr. Hulen ShoutsMunley's office for review.[place] LighterMaria Carnelia Oscar, RN,BSN 10/06/2017 4:17 PM

## 2017-10-06 NOTE — Progress Notes (Signed)
Miranda GamblesRita S Berger 62 y.o. female DOB: 11/11/1955 MRN: [id number]      Nutrition Note  No diagnosis found. Past Medical History:  Diagnosis Date  . Anemia    in the past   . Anxiety   . Asthma   . Depression   . Family history of adverse reaction to anesthesia    mom had nausea  . GERD (gastroesophageal reflux disease)   . Hepatitis    Hepatitis A in 3rd grade?  [name]. Hypertension   . PONV (postoperative nausea and vomiting)   . 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 Sleep apnea    uses CPAP, 12, starts at 6   Meds reviewed. Calcium-carbonate, remeron, MVI prilosec noted  HT: Ht Readings from Last 1 Encounters:  10/06/17 5\' 4"  (1.626 m)    WT: Wt Readings from Last 5 Encounters:  10/06/17 177 lb 14.6 oz (80.7 kg)  09/12/17 175 lb (79.4 kg)  08/23/17 175 lb 12.8 oz (79.7 kg)  08/09/17 173 lb 12.8 oz (78.8 kg)  08/01/17 171 lb 1.6 oz (77.6 kg)     Body mass index is 30.54 kg/m.   Current tobacco use? no  Labs:  Lipid Panel  No results found for: CHOL, TRIG, HDL, CHOLHDL, VLDL, LDLCALC, LDLDIRECT  Lab Results  Component Value Date   HGBA1C 5.5 07/22/2017   CBG (last 3)  No results for input(s): GLUCAP in the last 72 hours.  Nutrition Note Spoke with pt. Nutrition plan and goals reviewed with pt. Pt is following Step 2 of the Therapeutic Lifestyle Changes diet. Pt wants to lose wt. Pt has been trying to lose wt by eating out less, eating smaller portions, cooking more meals at home. Wt loss tips reviewed (label reading, how to build a healthy plate, portion sizes, eating frequently across the day).  Per discussion, pt does use canned/convenience foods often. Pt rarely adds salt to food. Pt eats out infrequently. Pt shared she has had lower blood pressure reading, has come off of her blood pressure medication as advised by medical provider. As her blood pressure has been low she was advised to consume  more sodium, reportedly ~2000 mg a day. Pt expressed understanding of the information reviewed. Pt aware of nutrition education classes offered and plans on attending nutrition classes.  Nutrition Diagnosis ? Food-and nutrition-related knowledge deficit related to lack of exposure to information as related to diagnosis of: ? CVD  ? Obesity related to excessive energy intake as evidenced by a Body mass index is 30.54 kg/m.  Nutrition Intervention ? Pt's individual nutrition plan and goals reviewed with pt.  Nutrition Goal(s):   ? Pt to identify and limit food sources of saturated fat, trans fat, refined carbohydrates. ? Pt to identify food quantities necessary to achieve weight loss of 6-24 lbs. at graduation from cardiac rehab.    Plan:  ? Pt to attend nutrition classes ? Nutrition I ? Nutrition II ? Portion Distortion  ? Will provide client-centered nutrition education as part of interdisciplinary care ? Monitor and evaluate progress toward nutrition goal with team.   Ross MarcusAubrey Burklin, MS, RD, LDN 10/06/2017 10:49 AM

## 2017-10-12 ENCOUNTER — Encounter (HOSPITAL_COMMUNITY): Payer: Self-pay

## 2017-10-12 ENCOUNTER — Ambulatory Visit (HOSPITAL_COMMUNITY): Payer: BC Managed Care – PPO

## 2017-10-12 ENCOUNTER — Encounter (HOSPITAL_COMMUNITY)
Admission: RE | Admit: 2017-10-12 | Discharge: 2017-10-12 | Disposition: A | Discharge status: Home / Self Care | Payer: BC Managed Care – PPO | Source: Ambulatory Visit | Attending: Cardiology | Admitting: Cardiology

## 2017-10-12 DIAGNOSIS — Z952 Presence of prosthetic heart valve: Secondary | ICD-10-CM | POA: Diagnosis not present

## 2017-10-12 NOTE — Progress Notes (Signed)
Daily Session Note  Patient Details  Name: Miranda Berger MRN: [id number] Date of Birth: 11/11/1955 Referring Provider:     CARDIAC REHAB PHASE II ORIENTATION from 10/06/2017 in [facility]  Referring Provider  Munley, Brian Md      Encounter Date: 10/12/2017  Check In: Session Check In - 10/12/17 0941      Check-In   Supervising physician immediately available to respond to emergencies  [facility] immediately available    Physician(s)  Dr. Hongalgi       Capillary Blood Glucose: No results found for this or any previous visit (from the past 24 hour(s)).  Exercise Prescription Changes - 10/12/17 0800      Response to Exercise   Blood Pressure (Admit)  140/80    Blood Pressure (Exercise)  130/68    Blood Pressure (Exit)  112/78    Heart Rate (Admit)  102 bpm    Heart Rate (Exercise)  137 bpm    Heart Rate (Exit)  109 bpm    Rating of Perceived Exertion (Exercise)  13    Perceived Dyspnea (Exercise)  0    Symptoms  None     Comments  Pt oriented to exercise equipment     Duration  Progress to 30 minutes of  aerobic without signs/symptoms of physical distress    Intensity  THRR New      Progression   Progression  Continue to progress workloads to maintain intensity without signs/symptoms of physical distress.    Average METs  3      Resistance Training   Training Prescription  No      Interval Training   Interval Training  No      Treadmill   MPH  2.8    Grade  1    Minutes  10    METs  3.53      Bike   Level  2.5    Minutes  10    METs  3      NuStep   Level  3    SPM  80    Minutes  10    METs  2.1       Social History   Tobacco Use  Smoking Status Never Smoker  Smokeless Tobacco Never Used    Goals Met:  Exercise tolerated well  Goals Unmet:  Not Applicable  Comments: Pt started cardiac rehab today.  Pt tolerated light exercise without difficulty. VSS, telemetry-ST, asymptomatic.  Medication list  reconciled. Pt denies barriers to medicaiton compliance.  PSYCHOSOCIAL ASSESSMENT:  PHQ-0. Pt exhibits positive coping skills, hopeful outlook with supportive family. No psychosocial needs identified at this time, no psychosocial interventions necessary.  Pt oriented to exercise equipment and routine.    Understanding verbalized.    Dr. Traci Turner is Medical Director for Cardiac Rehab at [facility]. 

## 2017-10-13 ENCOUNTER — Telehealth: Payer: Self-pay

## 2017-10-13 NOTE — Telephone Encounter (Signed)
I called pt, left a detailed message on her cell phone, per DPR, reminding her to bring her cpap to her appt on Monday and to call us back with any questions or concerns.

## 2017-10-14 ENCOUNTER — Ambulatory Visit (HOSPITAL_COMMUNITY): Payer: BC Managed Care – PPO

## 2017-10-14 ENCOUNTER — Encounter (HOSPITAL_COMMUNITY)
Admission: RE | Admit: 2017-10-14 | Discharge: 2017-10-14 | Disposition: A | Discharge status: Home / Self Care | Payer: BC Managed Care – PPO | Source: Ambulatory Visit | Attending: Cardiology | Admitting: Cardiology

## 2017-10-14 DIAGNOSIS — Z952 Presence of prosthetic heart valve: Secondary | ICD-10-CM

## 2017-10-16 ENCOUNTER — Encounter: Payer: Self-pay | Admitting: Neurology

## 2017-10-17 ENCOUNTER — Encounter: Payer: Self-pay | Admitting: Neurology

## 2017-10-17 ENCOUNTER — Ambulatory Visit: Payer: BC Managed Care – PPO | Admitting: Neurology

## 2017-10-17 ENCOUNTER — Encounter (HOSPITAL_COMMUNITY): Payer: BC Managed Care – PPO

## 2017-10-17 VITALS — BP 124/86 | HR 108 | Ht 64.0 in | Wt 175.0 lb

## 2017-10-17 DIAGNOSIS — Z9989 Dependence on other enabling machines and devices: Secondary | ICD-10-CM

## 2017-10-17 DIAGNOSIS — G4733 Obstructive sleep apnea (adult) (pediatric): Secondary | ICD-10-CM

## 2017-10-17 NOTE — Progress Notes (Signed)
Subjective:    Patient ID: Miranda Berger is a 62 y.o. female.  HPI     Interim history:   Ms. Miranda Berger is a 62 year old right-handed woman with an underlying medical history of major depression, panic disorder, hypertension, carpal tunnel syndrome, status post surgery on the right wrist, left sided plantar fasciitis, and obesity, who presents for follow-up consultation of her obstructive sleep apnea, on treatment with CPAP at home. The patient is unaccompanied today. I last saw her on 06/10/2016, at which time she was doing well with CPAP. She had increase in stress at home unfortunately. She had undergone arthroscopic knee surgery on the right recently.  Today, 10/17/2017: I reviewed her CPAP compliance data from 09/17/2017 through 10/16/2017 which is a total of 30 days, during which time she used her CPAP every night with percent used days greater than 4 hours at 96.7%, indicating excellent compliance with an average usage of 7 hours and 39 minutes, residual AHI at goal at 0.7 per hour, leak very low, pressure of 13 cm. She reports that CPAP is going well. She had valve replacement surgery recently, on 07/26/17, she was diagnosed recently with severe AS. It runs in her family, but she had no significant Sx and was found to have a new murmur during a routine exam with her Gyn. She is feeling better, has started cardiac rehabilitation and is looking forward to that. Her energy level is not quite there yet but she is very helpful. She has recuperated from the surgery quite well thus far and did cope with the stress quite well. She felt that she did not even have time to process all of it.    The patient's allergies, current medications, family history, past medical history, past social history, past surgical history and problem list were reviewed and updated as appropriate.    Previously (copied from previous notes for reference):   I saw her on 06/11/2015, at which time she reported doing well, we  discussed her sleep study findings at the time, she was sleeping better with CPAP therapy was compliant with it. I suggested a one-year checkup.    I reviewed her CPAP compliance data from 05/11/16 through 06/09/16, which is a total of 30 days, during which time she used her machine 28 days with percent used days greater than 4 hours at 73.3%, indicating adequate compliance with an average usage of 5 hours and 4 minutes, residual AHI low at 0.5 per hour, leak acceptable, pressure of 13 cm.    I reviewed her CPAP compliance data from 05/12/2015 through 06/10/2015 which is a total of 30 days during which time she used her machine every night with percent used days greater than 4 hours at 96.7%, indicating excellent compliance with an average usage of 7 hours and 13 minutes, residual AHI 1.1 per hour, leak at times high.   I first met her on 07/31/2014 at the request of her psychiatrist, at which time the patient reported snoring, poor sleep consolidation, excessive daytime somnolence and witnessed apneic pauses while asleep. I invited her back for sleep study. She had a baseline sleep study, followed by a CPAP titration study and I went over her test results with her in detail today. Her baseline sleep study from 08/20/2014 showed a sleep efficiency of 89.6% with a latency to sleep of 34 minutes and wake after sleep onset of 10.5 minutes with mild sleep fragmentation noted. She had an increased percentage of slow-wave sleep and a normal percentage of  REM sleep with a normal REM latency. She had moderate PLMS with mild arousals with a PLM index of 35.2 per hour and a PLM associated arousal index of 4.1 per hour. She had no significant EKG or EEG changes. She had mild to moderate and at times loud snoring. Total AHI was 10.7 per hour, rising to 26.7 per hour during REM sleep and 32.2 per hour in the supine position. Baseline oxygen saturation was 92%, nadir was 71% during REM sleep. Based on her test results I  invited her back for a full night CPAP titration study. She had this on 09/17/2014. Sleep efficiency was 88%, latency to sleep of 16.5 minutes and wake after sleep onset was 38 minutes with mild to moderate sleep fragmentation noted. She had a normal arousal index. She had an increased percentage of slow-wave sleep and an increased percentage of REM sleep with a normal REM latency. She had severe PLMS with an index of 74.1 per hour, with an associated arousal index of only 3.7 per hour. Average oxygen saturation was 95%, nadir was 90%. CPAP was titrated from a pressure of 5 cm to 15 cm. Her AHI was 2.2 per hour on a pressure of 13 cm. Based on her test results are prescribed CPAP for home use.   I reviewed her CPAP compliance data from 11/03/2014 through 12/02/2014 which is a total of 30 days during which time she used her machine every night with percent used days greater than 4 hours at 80%, indicating very good compliance with an average usage of 5 hours and 12 minutes, residual AHI low at 0.8 per hour, leak at times high, pressure at 13 cm.   07/31/2014: She reports snoring, witnessed apneas, poor sleep consolidation and excessive daytime somnolence. I reviewed your office note from 06/25/2014 which you kindly included. She had a sleep study about a year ago but since then her sleep issues have become worse. She can take prolonged during the day. She is currently on Xanax 0.25 mg up to 4 times a day as needed for anxiety and Remeron 45 mg each night. She has been off of Zoloft and on Remeron for the past 1+ year. She had a sleep study over a year ago which per her report showed leg twitching at night. She does have some restless leg symptoms. She wakes up to use the bathroom perhaps once per night. She has occasional morning headaches. She does not wake up rested despite getting enough sleep. Bedtime is around 10:30 to 11 and usual price time is around 8:30. Epworth sleepiness score is 17 out of 24 today  and fatigue score is 15 out of 63 today. Her father has obstructive sleep apnea and uses a CPAP machine. She had a tonsillectomy in the past. She would be willing to use a CPAP machine if the need arises. She drinks usually coffee a few times a week but does not have to have caffeine every day. She is a nonsmoker and drinks alcohol very rarely. 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 Snoring can be quite loud. She makes abnormal noises in her sleep. She has some leg twitching at night.    Her Past Medical History Is Significant For: Past Medical History:  Diagnosis Date  . Anemia    in the past   . Anxiety   . Asthma   . Depression   . Family history of adverse reaction to anesthesia    mom had nausea  .  GERD (gastroesophageal reflux disease)   . Hepatitis    Hepatitis A in 3rd grade?  [name] Hypertension   . PONV (postoperative nausea and vomiting)   . 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 07/26/2017   [employer]wards [employer], size 23  . Sleep apnea    uses CPAP, 12, starts at 6    Her Past Surgical History Is Significant For: Past Surgical History:  Procedure Laterality Date  . ABDOMINAL HYSTERECTOMY    . AORTIC VALVE REPLACEMENT N/A 07/26/2017   Procedure: MINIMALLY INVASIVE AORTIC VALVE REPLACEMENT (AVR);  Surgeon: Rexene Alberts, MD;  Location: [facility];  Service: Open Heart Surgery;  Laterality: N/A;  . BLADDER SURGERY  2011  . CARDIAC CATHETERIZATION    . CARPAL TUNNEL RELEASE  2012  . CHOLECYSTECTOMY    . RIGHT/LEFT HEART CATH AND CORONARY ANGIOGRAPHY N/A 07/08/2017   Procedure: RIGHT/LEFT HEART CATH AND CORONARY ANGIOGRAPHY;  Surgeon: Sherren Mocha, MD;  Location: [facility];  Service: Cardiovascular;  Laterality: N/A;  . TEE WITHOUT CARDIOVERSION N/A 07/26/2017   Procedure: TRANSESOPHAGEAL ECHOCARDIOGRAM (TEE);  Surgeon: Rexene Alberts, MD;  Location: [facility];  Service: Open Heart Surgery;  Laterality:  N/A;  . TONSILLECTOMY      Her Family History Is Significant For: Family History  Problem Relation Age of Onset  . Stroke Mother   . Leukemia Mother   . Stroke Father   . Sleep apnea Father   . Valvular heart disease Brother   . Heart Problems Maternal Aunt   . Valvular heart disease Paternal Grandfather   . Valvular heart disease Maternal Aunt     Her Social History Is Significant For: Social History   Socioeconomic History  . Marital status: Married    Spouse name: Not on file  . Number of children: 1  . Years of education: BS  . Highest education level: Not on file  Occupational History  . Not on file  Social Needs  . Financial resource strain: Not on file  . Food insecurity:    Worry: Never true    Inability: Never true  . Transportation needs:    Medical: No    Non-medical: No  Tobacco Use  . Smoking status: Never Smoker  . Smokeless tobacco: Never Used  Substance and Sexual Activity  . Alcohol use: No    Alcohol/week: 0.0 standard drinks  . Drug use: No  . Sexual activity: Not on file  Lifestyle  . Physical activity:    Days per week: 0 days    Minutes per session: 0 min  . Stress: Rather much  Relationships  . Social connections:    Talks on phone: Not on file    Gets together: Not on file    Attends religious service: Not on file    Active member of club or organization: Not on file    Attends meetings of clubs or organizations: Not on file    Relationship status: Not on file  Other Topics Concern  . Not on file  Social History Narrative   Occasionally consumes tea or coffee    Her Allergies Are:  Allergies  Allergen Reactions  . Codeine Itching  . Prednisone Itching  :   Her Current Medications Are:  Outpatient Encounter Medications as of 10/17/2017  Medication Sig  . acetaminophen (TYLENOL) 500 MG tablet Take 1,000 mg by mouth every 6 (six) hours as needed.  [name] albuterol (PROVENTIL HFA;VENTOLIN HFA) 108 (90 Base) MCG/ACT inhaler  Inhale  2 puffs into the lungs every 6 (six) hours as needed for wheezing or shortness of breath.  . ALPRAZolam (XANAX) 0.25 MG tablet Take 0.25 mg by mouth 2 (two) times daily.   [name] amoxicillin (AMOXIL) 500 MG capsule Take 2,000 mg (4 tablets) 45 minutes prior to dental procedures.  [name] aspirin EC 325 MG tablet Take 1 tablet (325 mg total) by mouth daily. (Patient taking differently: Take 162 mg by mouth daily. )  . buPROPion (WELLBUTRIN XL) 150 MG 24 hr tablet Take 150 mg by mouth daily.  . calcium carbonate (OS-CAL) 600 MG TABS tablet Take 600 mg by mouth daily.   . cyclobenzaprine (FLEXERIL) 10 MG tablet TK 1 T PO QD HS PRN MUSCLE TIGHTNESS  . fluticasone (FLONASE) 50 MCG/ACT nasal spray Place 1 spray into both nostrils daily as needed.   . Fluticasone-Salmeterol (ADVAIR) 250-50 MCG/DOSE AEPB Inhale 1 puff into the lungs 2 (two) times daily.  . meloxicam (MOBIC) 15 MG tablet TK 1 T PO QD IN THE MORNING WF  . mirtazapine (REMERON) 45 MG tablet Take 45 mg by mouth at bedtime.  . Multiple Vitamin (MULTIVITAMIN) capsule Take 1 capsule by mouth daily.  [name] omeprazole (PRILOSEC) 20 MG capsule Take 20 mg by mouth daily.  . [DISCONTINUED] atenolol (TENORMIN) 25 MG tablet Take 25 mg by mouth every other day.   No facility-administered encounter medications on file as of 10/17/2017.   :  Review of Systems:  Out of a complete 14 point review of systems, all are reviewed and negative with the exception of these symptoms as listed below: Review of Systems  Neurological:       Pt presents today to discuss her cpap. Pt reports that her cpap is going well. She had a heart valve replacement surgery this summer.    Objective:  Neurological Exam  Physical Exam Physical Examination:   Vitals:   10/17/17 1045  BP: 124/86  Pulse: (!) 108   General Examination: The patient is a very pleasant 62 y.o. female in no acute distress. She appears well-developed and well-nourished and well groomed.   HEENT:  Normocephalic, atraumatic, pupils are equal, round and reactive to light and accommodation. Extraocular tracking is good without limitation to gaze excursion or nystagmus noted. Normal smooth pursuit is noted. Hearing is grossly intact. Face is symmetric with normal facial animation and normal facial sensation. Speech is clear with no dysarthria noted. There is no hypophonia. There is no lip, neck/head, jaw or voice tremor. Neck is supple with full range of passive and active motion. There are no carotid bruits on auscultation. Oropharynx exam reveals: mild to moderate mouth dryness, adequate dental hygiene and mild airway crowding. Mallampati is class II. Tongue protrudes centrally and palate elevates symmetrically. Tonsils are absent.   Chest: Clear to auscultation without wheezing, rhonchi or crackles noted. Small horizontal scar from AVS. Well-healing.  Heart: S1+S2+0, regular and normal without murmurs, rubs or gallops noted, mildly tachycardic.   Abdomen: Soft, non-tender and non-distended with normal bowel sounds appreciated on auscultation.  Extremities: There is no pitting edema in the distal lower extremities bilaterally.   Skin: Warm and dry without trophic changes noted.  Musculoskeletal: exam reveals no obvious joint deformities, tenderness or joint swelling or erythema.   Neurologically:  Mental status: The patient is awake, alert and oriented in all 4 spheres. Her immediate and remote memory, attention, language skills and fund of knowledge are appropriate. There is no evidence of aphasia, agnosia, apraxia or  anomia. Speech is clear with normal prosody and enunciation. Thought process is linear. Mood is normal and affect is normal.  Cranial nerves II - XII are as described above under HEENT exam.  Motor exam: Normal bulk, strength and tone is noted. There is no drift, tremor or rebound. Romberg is negative. Reflexes are 1-2+ throughout. Fine motor skills and coordination:  grossly intact.  Cerebellar testing: No dysmetria or intention tremor. There is no truncal or gait ataxia.  Sensory exam: intact to light touch in the upper and lower extremities.  Gait, station and balance: She stands with slight difficulty.   Assessment and Plan:   In summary, AIGNER HORSEMAN is a very pleasant 62 year old female with an underlying medical history of major depression, panic disorder, hypertension, carpal tunnel syndrome, status post surgery on the right wrist, left sided plantar fasciitis, and obesity, who presents for follow-up consultation of her obstructive sleep apnea. She is well established on CPAP therapy with excellent compliance. She had surgery for severe aortic stenosis in June 2019, has so for recuperated well and is in cardiac rehabilitation. From the sleep standpoint, she had baseline sleep study testing in June 2016 and a CPAP titration study in July 2016. She is commended for her excellent treatment adherence. She is advised to follow-up routinely in one year, she can see one of our nurse practitioners at the time. I answered all her questions today and she was in agreement.

## 2017-10-17 NOTE — Patient Instructions (Addendum)
You have continued to do well on CPAP, please be sure to change your filter every 1-2 months, your mask about every 3 months, headgear about 3-6 months, hose about 6 months, humidifier chamber about yearly. Some restrictions are imposed by her insurance carrier with regard to how frequently you can get certain supplies.   Please continue using your CPAP regularly. While your insurance requires that you use CPAP at least 4 hours each night on 70% of the nights, I recommend, that you not skip any nights and use it throughout the night if you can. Getting used to CPAP and staying with the treatment long term does take time and patience and discipline. Untreated obstructive sleep apnea when it is moderate to severe can have an adverse impact on cardiovascular health and raise her risk for heart disease, arrhythmias, hypertension, congestive heart failure, stroke and diabetes. Untreated obstructive sleep apnea causes sleep disruption, nonrestorative sleep, and sleep deprivation. This can have an impact on your day to day functioning and cause daytime sleepiness and impairment of cognitive function, memory loss, mood disturbance, and problems focussing. Using CPAP regularly can improve these symptoms.  Keep up the good work! We will see you in one year. You can see Butch PennyMegan Millikan, NP next time.

## 2017-10-19 ENCOUNTER — Ambulatory Visit (HOSPITAL_COMMUNITY): Payer: BC Managed Care – PPO

## 2017-10-19 ENCOUNTER — Encounter (HOSPITAL_COMMUNITY)
Admission: RE | Admit: 2017-10-19 | Discharge: 2017-10-19 | Disposition: A | Discharge status: Home / Self Care | Payer: BC Managed Care – PPO | Source: Ambulatory Visit | Attending: Cardiology | Admitting: Cardiology

## 2017-10-19 VITALS — Ht 64.0 in | Wt 177.0 lb

## 2017-10-19 DIAGNOSIS — Z952 Presence of prosthetic heart valve: Secondary | ICD-10-CM

## 2017-10-19 NOTE — Progress Notes (Signed)
Assunta GamblesRita S Tino 62 y.o. female Nutrition Note Spoke with pt. Nutrition plan and goals reviewed with pt. Pt is following a heart healthy diet. Pt wants to lose wt. Pt has been trying to lose wt by eating out less, eating smaller portions, cooking more meals at home. Additional weightt loss tips reviewed with patient today (label reading, how to build a healthy plate, portion sizes, eating frequently across the day).  Per discussion, pt does use canned/convenience foods often. Pt rarely adds salt to food. Pt eats out infrequently. As pts A1c is near pre-diabetes discussed managing blood glucose by replacing refined carbohydrates with complex carbohydrates. Pt shared she has had lower blood pressure reading, is still off of her blood pressure medication as advised by medical provider. Pt continues to consume ~2000 mg sodium a day, as advised by her medical provider. Pt expressed understanding of the information reviewed. Pt aware of nutrition education classes offered and plans on attending nutrition classes.  Lab Results  Component Value Date   HGBA1C 5.5 07/22/2017    Wt Readings from Last 3 Encounters:  10/19/17 177 lb 0.5 oz (80.3 kg)  10/17/17 175 lb (79.4 kg)  10/06/17 177 lb 14.6 oz (80.7 kg)    Nutrition Diagnosis  ? Obesity related to excessive energy intake as evidenced by a BMI of Body mass index is 30.39 kg/m.  Nutrition Intervention ? Pt's individual nutrition plan reviewed with pt. ? Benefits of adopting Heart Healthy diet discussed when Medficts reviewed.    Goal(s)  Pt to identify and limit food sources of saturated fat, trans fat, refined carbohydrates.  Pt to identify food quantities necessary to achieve weight loss of 6-24 lbs. at graduation from cardiac rehab.   Plan:   Pt to attend nutrition classes ? Nutrition I ? Nutrition II ? Portion Distortion   Will provide client-centered nutrition education as part of interdisciplinary care  Monitor and evaluate progress  toward nutrition goal with team.    Ross MarcusAubrey Burklin, MS, RD, LDN 10/19/2017 7:54 AM

## 2017-10-21 ENCOUNTER — Ambulatory Visit (HOSPITAL_COMMUNITY): Payer: BC Managed Care – PPO

## 2017-10-21 ENCOUNTER — Encounter (HOSPITAL_COMMUNITY)
Admission: RE | Admit: 2017-10-21 | Discharge: 2017-10-21 | Disposition: A | Discharge status: Home / Self Care | Payer: BC Managed Care – PPO | Source: Ambulatory Visit | Attending: Cardiology | Admitting: Cardiology

## 2017-10-21 ENCOUNTER — Encounter (HOSPITAL_COMMUNITY): Payer: Self-pay

## 2017-10-21 DIAGNOSIS — Z952 Presence of prosthetic heart valve: Secondary | ICD-10-CM

## 2017-10-26 ENCOUNTER — Ambulatory Visit (HOSPITAL_COMMUNITY): Payer: BC Managed Care – PPO

## 2017-10-26 ENCOUNTER — Encounter (HOSPITAL_COMMUNITY)
Admission: RE | Admit: 2017-10-26 | Discharge: 2017-10-26 | Disposition: A | Discharge status: Home / Self Care | Payer: BC Managed Care – PPO | Source: Ambulatory Visit | Attending: Cardiology | Admitting: Cardiology

## 2017-10-26 DIAGNOSIS — Z952 Presence of prosthetic heart valve: Secondary | ICD-10-CM | POA: Diagnosis present

## 2017-10-27 ENCOUNTER — Telehealth: Payer: Self-pay | Admitting: Cardiology

## 2017-10-27 NOTE — Telephone Encounter (Signed)
Patient having issues with elevated heartrate, 105 upon getting out of bed, rehab is having her put feet up before rehab due to elevation.

## 2017-10-27 NOTE — Telephone Encounter (Signed)
Patient informed that with conditioning and cardiac rehab this should stop per Dr. Dulce Sellar. Patient verbally understands.

## 2017-10-27 NOTE — Progress Notes (Signed)
Cardiac Individual Treatment Plan  Patient Details  Name: LAQUESHA HOLCOMB MRN: [id number] Date of Birth: 1955/12/27 Referring Provider:     CARDIAC REHAB PHASE II ORIENTATION from 10/06/2017 in [facility] CARDIAC [facility]  Referring Provider  Norman Herrlich Md      Initial Encounter Date:    CARDIAC REHAB PHASE II ORIENTATION from 10/06/2017 in [facility] CARDIAC REHAB  Date  10/06/17      Visit Diagnosis: S/P AVR (aortic valve replacement) S/P minimally invasive 07/26/17  Patient's Home Medications on Admission:  Current Outpatient Medications:  .  acetaminophen (TYLENOL) 500 MG tablet, Take 1,000 mg by mouth every 6 (six) hours as needed., Disp: , Rfl:  .  albuterol (PROVENTIL HFA;VENTOLIN HFA) 108 (90 Base) MCG/ACT inhaler, Inhale 2 puffs into the lungs every 6 (six) hours as needed for wheezing or shortness of breath., Disp: , Rfl:  .  ALPRAZolam (XANAX) 0.25 MG tablet, Take 0.25 mg by mouth 2 (two) times daily. , Disp: , Rfl:  .  amoxicillin (AMOXIL) 500 MG capsule, Take 2,000 mg (4 tablets) 45 minutes prior to dental procedures., Disp: 12 capsule, Rfl: 2 .  aspirin EC 325 MG tablet, Take 1 tablet (325 mg total) by mouth daily. (Patient taking differently: Take 162 mg by mouth daily. ), Disp: 30 tablet, Rfl: 0 .  buPROPion (WELLBUTRIN XL) 150 MG 24 hr tablet, Take 150 mg by mouth daily., Disp: , Rfl:  .  calcium carbonate (OS-CAL) 600 MG TABS tablet, Take 600 mg by mouth daily. , Disp: , Rfl:  .  cyclobenzaprine (FLEXERIL) 10 MG tablet, TK 1 T PO QD HS PRN MUSCLE TIGHTNESS, Disp: , Rfl: 1 .  fluticasone (FLONASE) 50 MCG/ACT nasal spray, Place 1 spray into both nostrils daily as needed. , Disp: , Rfl:  .  Fluticasone-Salmeterol (ADVAIR) 250-50 MCG/DOSE AEPB, Inhale 1 puff into the lungs 2 (two) times daily., Disp: , Rfl:  .  meloxicam (MOBIC) 15 MG tablet, TK 1 T PO QD IN THE MORNING WF, Disp: , Rfl: 1 .  mirtazapine (REMERON) 45 MG tablet, Take 45 mg by  mouth at bedtime., Disp: , Rfl:  .  Multiple Vitamin (MULTIVITAMIN) capsule, Take 1 capsule by mouth daily., Disp: , Rfl:  .  omeprazole (PRILOSEC) 20 MG capsule, Take 20 mg by mouth daily., Disp: , Rfl:   Past Medical History: Past Medical History:  Diagnosis Date  . Anemia    in the past   . Anxiety   . Asthma   . Depression   . Family history of adverse reaction to anesthesia    mom had nausea  . GERD (gastroesophageal reflux disease)   . Hepatitis    Hepatitis A in 3rd grade?  [name] Hypertension   . PONV (postoperative nausea and vomiting)   . 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 Sleep apnea    uses CPAP, 12, starts at 6    Tobacco Use: Social History   Tobacco Use  Smoking Status Never Smoker  Smokeless Tobacco Never Used    Labs: Recent Review Flowsheet Data    Labs for ITP Cardiac and Pulmonary Rehab Latest Ref Rng & Units 07/26/2017 07/26/2017 07/26/2017 07/27/2017 07/27/2017   Hemoglobin A1c 4.8 - 5.6 % - - - - -   PHART 7.350 - 7.450 7.330(L) - 7.293(L) 7.324(L) -   PCO2ART 32.0 - 48.0 mmHg 41.2 - 44.7 42.7 -  HCO3 20.0 - 28.0 mmol/L 21.9 - 21.8 22.3 -   TCO2 22 - 32 mmol/L 23 22 23 24 23    ACIDBASEDEF 0.0 - 2.0 mmol/L 4.0(H) - 5.0(H) 4.0(H) -   O2SAT % 99.0 - 99.0 97.0 -      Capillary Blood Glucose: Lab Results  Component Value Date   GLUCAP 104 (H) 07/28/2017   GLUCAP 100 (H) 07/28/2017   GLUCAP 148 (H) 07/27/2017   GLUCAP 130 (H) 07/27/2017   GLUCAP 123 (H) 07/27/2017     Exercise Target Goals: Exercise Program Goal: Individual exercise prescription set using results from initial 6 min walk test and THRR while considering  patient's activity barriers and safety.   Exercise Prescription Goal: Initial exercise prescription builds to 30-45 minutes a day of aerobic activity, 2-3 days per week.  Home exercise guidelines will be given to patient during program as part of exercise  prescription that the participant will acknowledge.  Activity Barriers & Risk Stratification: Activity Barriers & Cardiac Risk Stratification - 10/06/17 1225      Activity Barriers & Cardiac Risk Stratification   Activity Barriers  Arthritis;Deconditioning;Muscular Weakness;Other (comment)    Comments  hip discomfort B (R>L) R knee discomfort     Cardiac Risk Stratification  High       6 Minute Walk: 6 Minute Walk    Row Name 10/06/17 1224         6 Minute Walk   Phase  Initial     Distance  1693 feet     Walk Time  6 minutes     # of Rest Breaks  0     MPH  3.2     METS  4.1     RPE  12     Symptoms  No     Resting HR  92 bpm     Resting BP  124/72     Resting Oxygen Saturation   97 %     Exercise Oxygen Saturation  during 6 min walk  98 %     Max Ex. HR  129 bpm     Max Ex. BP  126/80     2 Minute Post BP  128/80        Oxygen Initial Assessment:   Oxygen Re-Evaluation:   Oxygen Discharge (Final Oxygen Re-Evaluation):   Initial Exercise Prescription: Initial Exercise Prescription - 10/06/17 1200      Date of Initial Exercise RX and Referring Provider   Date  10/06/17    Referring Provider  Norman Herrlich Md      Treadmill   MPH  2.8    Grade  1    Minutes  10    METs  3.53      Bike   Level  2.5   upright scifit   Minutes  10    METs  3      NuStep   Level  3    SPM  80    Minutes  10    METs  2      Prescription Details   Frequency (times per week)  3    Duration  Progress to 30 minutes of continuous aerobic without signs/symptoms of physical distress      Intensity   THRR 40-80% of Max Heartrate  64-127    Ratings of Perceived Exertion  11-13    Perceived Dyspnea  0-4      Progression   Progression  Continue to progress workloads to  maintain intensity without signs/symptoms of physical distress.      Resistance Training   Training Prescription  Yes    Weight  2lbs    Reps  10-15       Perform Capillary Blood Glucose checks  as needed.  Exercise Prescription Changes: Exercise Prescription Changes    Row Name 10/12/17 0800 10/21/17 1623           Response to Exercise   Blood Pressure (Admit)  140/80  109/74      Blood Pressure (Exercise)  130/68  130/62      Blood Pressure (Exit)  112/78  112/64      Heart Rate (Admit)  102 bpm  100 bpm      Heart Rate (Exercise)  137 bpm  138 bpm      Heart Rate (Exit)  109 bpm  92 bpm      Rating of Perceived Exertion (Exercise)  13  14      Perceived Dyspnea (Exercise)  0  0      Symptoms  None   None       Comments  Pt oriented to exercise equipment   -      Duration  Progress to 30 minutes of  aerobic without signs/symptoms of physical distress  Continue with 30 min of aerobic exercise without signs/symptoms of physical distress.      Intensity  THRR New  THRR unchanged        Progression   Progression  Continue to progress workloads to maintain intensity without signs/symptoms of physical distress.  -      Average METs  3  2.81        Resistance Training   Training Prescription  No  Yes      Weight  -  2lbs      Reps  -  10-15      Time  -  10 Minutes        Interval Training   Interval Training  No  No        Treadmill   MPH  2.8  2.8      Grade  1  1      Minutes  10  10      METs  3.53  3.53        Bike   Level  2.5  2.5      Minutes  10  10      METs  3  2.4        NuStep   Level  3  3      SPM  80  85      Minutes  10  10      METs  2.1  2.5         Exercise Comments: Exercise Comments    Row Name 10/12/17 0830           Exercise Comments  Pt's first day of exercise. Pt oriented to exercise equipment. Will continue to increase workloads as tolerated by pt.           Exercise Goals and Review: Exercise Goals    Row Name 10/06/17 0909             Exercise Goals   Increase Physical Activity  Yes       Intervention  Provide advice, education, support and counseling about physical activity/exercise needs.;Develop an  individualized exercise prescription for aerobic and resistive training based on initial  evaluation findings, risk stratification, comorbidities and participant's personal goals.       Expected Outcomes  Short Term: Attend rehab on a regular basis to increase amount of physical activity.;Long Term: Exercising regularly at least 3-5 days a week.;Long Term: Add in home exercise to make exercise part of routine and to increase amount of physical activity.       Increase Strength and Stamina  Yes be able to walk without fear of having another cardiac event and without knee pain       Intervention  Provide advice, education, support and counseling about physical activity/exercise needs.;Develop an individualized exercise prescription for aerobic and resistive training based on initial evaluation findings, risk stratification, comorbidities and participant's personal goals.       Expected Outcomes  Short Term: Increase workloads from initial exercise prescription for resistance, speed, and METs.;Short Term: Perform resistance training exercises routinely during rehab and add in resistance training at home;Long Term: Improve cardiorespiratory fitness, muscular endurance and strength as measured by increased METs and functional capacity ( )       Able to understand and use rate of perceived exertion (RPE) scale  Yes       Intervention  Provide education and explanation on how to use RPE scale       Expected Outcomes  Short Term: Able to use RPE daily in rehab to express subjective intensity level;Long Term:  Able to use RPE to guide intensity level when exercising independently       Knowledge and understanding of Target Heart Rate Range (THRR)  Yes       Intervention  Provide education and explanation of THRR including how the numbers were predicted and where they are located for reference       Expected Outcomes  Short Term: Able to state/look up THRR;Long Term: Able to use THRR to govern intensity when  exercising independently;Short Term: Able to use daily as guideline for intensity in rehab       Able to check pulse independently  Yes       Intervention  Provide education and demonstration on how to check pulse in carotid and radial arteries.;Review the importance of being able to check your own pulse for safety during independent exercise       Expected Outcomes  Short Term: Able to explain why pulse checking is important during independent exercise;Long Term: Able to check pulse independently and accurately       Understanding of Exercise Prescription  Yes       Intervention  Provide education, explanation, and written materials on patient's individual exercise prescription       Expected Outcomes  Short Term: Able to explain program exercise prescription;Long Term: Able to explain home exercise prescription to exercise independently          Exercise Goals Re-Evaluation :   Discharge Exercise Prescription (Final Exercise Prescription Changes): Exercise Prescription Changes - 10/21/17 1623      Response to Exercise   Blood Pressure (Admit)  109/74    Blood Pressure (Exercise)  130/62    Blood Pressure (Exit)  112/64    Heart Rate (Admit)  100 bpm    Heart Rate (Exercise)  138 bpm    Heart Rate (Exit)  92 bpm    Rating of Perceived Exertion (Exercise)  14    Perceived Dyspnea (Exercise)  0    Symptoms  None     Duration  Continue with 30 min of aerobic exercise without signs/symptoms of physical  distress.    Intensity  THRR unchanged      Progression   Average METs  2.81      Resistance Training   Training Prescription  Yes    Weight  2lbs    Reps  10-15    Time  10 Minutes      Interval Training   Interval Training  No      Treadmill   MPH  2.8    Grade  1    Minutes  10    METs  3.53      Bike   Level  2.5    Minutes  10    METs  2.4      NuStep   Level  3    SPM  85    Minutes  10    METs  2.5       Nutrition:  Target Goals: Understanding of  nutrition guidelines, daily intake of sodium 1500mg , cholesterol 200mg , calories 30% from fat and 7% or less from saturated fats, daily to have 5 or more servings of fruits and vegetables.  Biometrics: Pre Biometrics - 10/06/17 1226      Pre Biometrics   Height  5\' 4"  (1.626 m)    Weight  80.7 kg    Waist Circumference  37 inches    Hip Circumference  43 inches    Waist to Hip Ratio  0.86 %    BMI (Calculated)  30.52    Triceps Skinfold  40 mm    % Body Fat  42.6 %    Grip Strength  30 kg    Flexibility  13 in    Single Leg Stand  30 seconds        Nutrition Therapy Plan and Nutrition Goals: Nutrition Therapy & Goals - 10/06/17 1053      Nutrition Therapy   Diet  heart healthy      Personal Nutrition Goals   Nutrition Goal  Pt to identify and limit food sources of saturated fat, trans fat, refined carbohydrates.    Personal Goal #2  Pt to identify food quantities necessary to achieve weight loss of 6-24 lbs. at graduation from cardiac rehab.      Intervention Plan   Intervention  Prescribe, educate and counsel regarding individualized specific dietary modifications aiming towards targeted core components such as weight, hypertension, lipid management, diabetes, heart failure and other comorbidities.    Expected Outcomes  Short Term Goal: Understand basic principles of dietary content, such as calories, fat, sodium, cholesterol and nutrients.       Nutrition Assessments: Nutrition Assessments - 10/06/17 1054      MEDFICTS Scores   Pre Score  18       Nutrition Goals Re-Evaluation: Nutrition Goals Re-Evaluation    Row Name 10/06/17 1053             Goals   Current Weight  177 lb 14.6 oz (80.7 kg)          Nutrition Goals Re-Evaluation: Nutrition Goals Re-Evaluation    Row Name 10/06/17 1053             Goals   Current Weight  177 lb 14.6 oz (80.7 kg)          Nutrition Goals Discharge (Final Nutrition Goals Re-Evaluation): Nutrition Goals  Re-Evaluation - 10/06/17 1053      Goals   Current Weight  177 lb 14.6 oz (80.7 kg)       Psychosocial:  Target Goals: Acknowledge presence or absence of significant depression and/or stress, maximize coping skills, provide positive support system. Participant is able to verbalize types and ability to use techniques and skills needed for reducing stress and depression.  Initial Review & Psychosocial Screening: Initial Psych Review & Screening - 10/06/17 1628      Initial Review   Current issues with  None Identified      Family Dynamics   Good Support System?  Yes   Ladasia has her husband, several friends and family members for support     Barriers   Psychosocial barriers to participate in program  The patient should benefit from training in stress management and relaxation.      Screening Interventions   Interventions  Encouraged to exercise;Provide feedback about the scores to participant;To provide support and resources with identified psychosocial needs    Expected Outcomes  Short Term goal: Utilizing psychosocial counselor, staff and physician to assist with identification of specific Stressors or current issues interfering with healing process. Setting desired goal for each stressor or current issue identified.;Long Term Goal: Stressors or current issues are controlled or eliminated.;Short Term goal: Identification and review with participant of any Quality of Life or Depression concerns found by scoring the questionnaire.;Long Term goal: The participant improves quality of Life and PHQ9 Scores as seen by post scores and/or verbalization of changes       Quality of Life Scores: Quality of Life - 10/06/17 1234      Quality of Life   Select  Quality of Life      Quality of Life Scores   Health/Function Pre  22 %    Socioeconomic Pre  30 %    Psych/Spiritual Pre  26.57 %    Family Pre  20.4 %    GLOBAL Pre  24.35 %      Scores of 19 and below usually indicate a poorer  quality of life in these areas.  A difference of  2-3 points is a clinically meaningful difference.  A difference of 2-3 points in the total score of the Quality of Life Index has been associated with significant improvement in overall quality of life, self-image, physical symptoms, and general health in studies assessing change in quality of life.  PHQ-9: Recent Review Flowsheet Data    Depression screen [facility] 2/9 10/12/2017   Decreased Interest 0   Down, Depressed, Hopeless 0   PHQ - 2 Score 0     Interpretation of Total Score  Total Score Depression Severity:  1-4 = Minimal depression, 5-9 = Mild depression, 10-14 = Moderate depression, 15-19 = Moderately severe depression, 20-27 = Severe depression   Psychosocial Evaluation and Intervention: Psychosocial Evaluation - 10/12/17 0827      Psychosocial Evaluation & Interventions   Interventions  Stress management education;Encouraged to exercise with the program and follow exercise prescription;Relaxation education    Comments  Riddhi endorses anxiety that she has had since a child.  She denotes herself as a "people pleaser."  Amana is currently on medications and sees a Veterinary surgeon.  Arrayah enjoys scrapbooking, sewing, reading, and traveling.    Expected Outcomes  Briar will report the ability to manager her anxiety.    Continue Psychosocial Services   Follow up required by staff       Psychosocial Re-Evaluation: Psychosocial Re-Evaluation    Row Name 10/21/17 [phone]             Psychosocial Re-Evaluation   Current issues with  History of  Depression;Current Anxiety/Panic       Comments  Evelin reports that she has "always been an anxious person even since a child." Cordie Beazley to meet with spiritual care department.         Expected Outcomes  Lesta will report additional coping skills for her anxiety.       Interventions  Relaxation education;Stress management education;Encouraged to attend Cardiac Rehabilitation for the exercise        Continue Psychosocial Services   Follow up required by staff          Psychosocial Discharge (Final Psychosocial Re-Evaluation): Psychosocial Re-Evaluation - 10/21/17 0813      Psychosocial Re-Evaluation   Current issues with  History of Depression;Current Anxiety/Panic    Comments  Kip reports that she has "always been an anxious person even since a child." Madoline Bhatt to meet with spiritual care department.      Expected Outcomes  Alveena will report additional coping skills for her anxiety.    Interventions  Relaxation education;Stress management education;Encouraged to attend Cardiac Rehabilitation for the exercise    Continue Psychosocial Services   Follow up required by staff       Vocational Rehabilitation: Provide vocational rehab assistance to qualifying candidates.   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 Participant will state understanding/return demonstration of topics presented.  Learning Barriers/Preferences: Learning Barriers/Preferences - 10/06/17 1630      Learning Barriers/Preferences   Learning Barriers  Sight   [place] Sink wears contacts   Clinical cytogeneticist Material;Video       Education Topics: Count Your Pulse:  -Group instruction provided by verbal instruction, demonstration, patient participation and written materials to support subject.  Instructors address importance of being able to find your pulse and how to count your pulse when at home without a heart monitor.  Patients get hands on experience counting their pulse with staff help and individually.   Heart Attack, Angina, and Risk Factor  Modification:  -Group instruction provided by verbal instruction, video, and written materials to support subject.  Instructors address signs and symptoms of angina and heart attacks.    Also discuss risk factors for heart disease and how to make changes to improve heart health risk factors.   Functional Fitness:  -Group instruction provided by verbal instruction, demonstration, patient participation, and written materials to support subject.  Instructors address safety measures for doing things around the house.  Discuss how to get up and down off the floor, how to pick things up properly, how to safely get out of a chair without assistance, and balance training.   Meditation and Mindfulness:  -Group instruction provided by verbal instruction, patient participation, and written materials to support subject.  Instructor addresses importance of mindfulness and meditation practice to help reduce stress and improve awareness.  Instructor also leads participants through a meditation exercise.    CARDIAC REHAB PHASE II EXERCISE from 10/19/2017 in [facility] CARDIAC REHAB  Date  10/19/17  Educator  Theda Belfast  Instruction Review Code  2- Demonstrated Understanding      Stretching for Flexibility and Mobility:  -Group instruction provided by verbal instruction,  patient participation, and written materials to support subject.  Instructors lead participants through series of stretches that are designed to increase flexibility thus improving mobility.  These stretches are additional exercise for major muscle groups that are typically performed during regular warm up and cool down.   Hands Only CPR:  -Group verbal, video, and participation provides a basic overview of AHA guidelines for community CPR. Role-play of emergencies allow participants the opportunity to practice calling for help and chest compression technique with discussion of AED use.   Hypertension: -Group verbal and  written instruction that provides a basic overview of hypertension including the most recent diagnostic guidelines, risk factor reduction with self-care instructions and medication management.   CARDIAC REHAB PHASE II EXERCISE from 10/19/2017 in [facility] CARDIAC REHAB  Date  10/14/17  Educator  RN  Instruction Review Code  2- Demonstrated Understanding       Nutrition I class: Heart Healthy Eating:  -Group instruction provided by PowerPoint slides, verbal discussion, and written materials to support subject matter. The instructor gives an explanation and review of the Therapeutic Lifestyle Changes diet recommendations, which includes a discussion on lipid goals, dietary fat, sodium, fiber, plant stanol/sterol esters, sugar, and the components of a well-balanced, healthy diet.   Nutrition II class: Lifestyle Skills:  -Group instruction provided by PowerPoint slides, verbal discussion, and written materials to support subject matter. The instructor gives an explanation and review of label reading, grocery shopping for heart health, heart healthy recipe modifications, and ways to make healthier choices when eating out.   Diabetes Question & Answer:  -Group instruction provided by PowerPoint slides, verbal discussion, and written materials to support subject matter. The instructor gives an explanation and review of diabetes co-morbidities, pre- and post-prandial blood glucose goals, pre-exercise blood glucose goals, signs, symptoms, and treatment of hypoglycemia and hyperglycemia, and foot care basics.   Diabetes Blitz:  -Group instruction provided by PowerPoint slides, verbal discussion, and written materials to support subject matter. The instructor gives an explanation and review of the physiology behind type 1 and type 2 diabetes, diabetes medications and rational behind using different medications, pre- and post-prandial blood glucose recommendations and Hemoglobin A1c  goals, diabetes diet, and exercise including blood glucose guidelines for exercising safely.    Portion Distortion:  -Group instruction provided by PowerPoint slides, verbal discussion, written materials, and food models to support subject matter. The instructor gives an explanation of serving size versus portion size, changes in portions sizes over the last 20 years, and what consists of a serving from each food group.   Stress Management:  -Group instruction provided by verbal instruction, video, and written materials to support subject matter.  Instructors review role of stress in heart disease and how to cope with stress positively.     Exercising on Your Own:  -Group instruction provided by verbal instruction, power point, and written materials to support subject.  Instructors discuss benefits of exercise, components of exercise, frequency and intensity of exercise, and end points for exercise.  Also discuss use of nitroglycerin and activating EMS.  Review options of places to exercise outside of rehab.  Review guidelines for sex with heart disease.   CARDIAC REHAB PHASE II EXERCISE from 10/19/2017 in [facility] CARDIAC REHAB  Date  10/12/17  Educator  EP  Instruction Review Code  2- Demonstrated Understanding      Cardiac Drugs I:  -Group instruction provided by verbal instruction and written materials to support subject.  Instructor  reviews cardiac drug classes: antiplatelets, anticoagulants, beta blockers, and statins.  Instructor discusses reasons, side effects, and lifestyle considerations for each drug class.   Cardiac Drugs II:  -Group instruction provided by verbal instruction and written materials to support subject.  Instructor reviews cardiac drug classes: angiotensin converting enzyme inhibitors (ACE-I), angiotensin II receptor blockers (ARBs), nitrates, and calcium channel blockers.  Instructor discusses reasons, side effects, and lifestyle considerations  for each drug class.   Anatomy and Physiology of the Circulatory System:  Group verbal and written instruction and models provide basic cardiac anatomy and physiology, with the coronary electrical and arterial systems. Review of: AMI, Angina, Valve disease, Heart Failure, Peripheral Artery Disease, Cardiac Arrhythmia, Pacemakers, and the ICD.   Other Education:  -Group or individual verbal, written, or video instructions that support the educational goals of the cardiac rehab program.   Holiday Eating Survival Tips:  -Group instruction provided by PowerPoint slides, verbal discussion, and written materials to support subject matter. The instructor gives patients tips, tricks, and techniques to help them not only survive but enjoy the holidays despite the onslaught of food that accompanies the holidays.   Knowledge Questionnaire Score: Knowledge Questionnaire Score - 10/06/17 1242      Knowledge Questionnaire Score   Pre Score  21/24       Core Components/Risk Factors/Patient Goals at Admission: Personal Goals and Risk Factors at Admission - 10/06/17 1635      Core Components/Risk Factors/Patient Goals on Admission    Weight Management  Weight Maintenance;Weight Loss;Yes    Intervention  Weight Management: Develop a combined nutrition and exercise program designed to reach desired caloric intake, while maintaining appropriate intake of nutrient and fiber, sodium and fats, and appropriate energy expenditure required for the weight goal.;Weight Management: Provide education and appropriate resources to help participant work on and attain dietary goals.;Weight Management/Obesity: Establish reasonable short term and long term weight goals.    Goal Weight: Short Term  150 lb (68 kg)    Goal Weight: Long Term  145 lb (65.8 kg)    Expected Outcomes  Short Term: Continue to assess and modify interventions until short term weight is achieved;Weight Maintenance: Understanding of the daily  nutrition guidelines, which includes 25-35% calories from fat, 7% or less cal from saturated fats, less than 200mg  cholesterol, less than 1.5gm of sodium, & 5 or more servings of fruits and vegetables daily;Weight Loss: Understanding of general recommendations for a balanced deficit meal plan, which promotes 1-2 lb weight loss per week and includes a negative energy balance of [id number] kcal/d;Understanding recommendations for meals to include 15-35% energy as protein, 25-35% energy from fat, 35-60% energy from carbohydrates, less than 200mg  of dietary cholesterol, 20-35 gm of total fiber daily;Understanding of distribution of calorie intake throughout the day with the consumption of 4-5 meals/snacks    Hypertension  Yes    Intervention  Provide education on lifestyle modifcations including regular physical activity/exercise, weight management, moderate sodium restriction and increased consumption of fresh fruit, vegetables, and low fat dairy, alcohol moderation, and smoking cessation.;Monitor prescription use compliance.    Expected Outcomes  Short Term: Continued assessment and intervention until BP is < 140/89mm HG in hypertensive participants. < 130/18mm HG in hypertensive participants with diabetes, heart failure or chronic kidney disease.;Long Term: Maintenance of blood pressure at goal levels.    Stress  Yes    Intervention  Offer individual and/or small group education and counseling on adjustment to heart disease, stress management and health-related lifestyle change. Teach  and support self-help strategies.    Expected Outcomes  Short Term: Participant demonstrates changes in health-related behavior, relaxation and other stress management skills, ability to obtain effective social support, and compliance with psychotropic medications if prescribed.;Long Term: Emotional wellbeing is indicated by absence of clinically significant psychosocial distress or social isolation.       Core Components/Risk  Factors/Patient Goals Review:  Goals and Risk Factor Review    Row Name 10/12/17 0830 10/21/17 0817           Core Components/Risk Factors/Patient Goals Review   Personal Goals Review  Weight Management/Obesity;Hypertension;Stress  Weight Management/Obesity;Hypertension;Stress      Review  Pt with multiple CAD RFs willing to participate in CR exercise. Lelania would like to develop an exercise routine and without fear.  Pt with multiple CAD RFs willing to participate in CR exercise. Dearia would like to develop an exercise routine and without fear.      Expected Outcomes  Talli will continue to participate in CR exercise, nutrition, and lifestyle modification opportunities.  Evalene will continue to participate in CR exercise, nutrition, and lifestyle modification opportunities.         Core Components/Risk Factors/Patient Goals at Discharge (Final Review):  Goals and Risk Factor Review - 10/21/17 0817      Core Components/Risk Factors/Patient Goals Review   Personal Goals Review  Weight Management/Obesity;Hypertension;Stress    Review  Pt with multiple CAD RFs willing to participate in CR exercise. Blayre would like to develop an exercise routine and without fear.    Expected Outcomes  Mohini will continue to participate in CR exercise, nutrition, and lifestyle modification opportunities.       ITP Comments: ITP Comments    Row Name 10/06/17 1223 10/12/17 0745 10/21/17 [id number]       ITP Comments  Dr. Armanda Magic, Medical Director  Pt started exercise today and tolerated it well.  30 Day ITP Review.  Malloree has started CR recently and is tolerating exercise well.  HR is increased initially and will come down with rest.  MD is aware and no chnages have been made.         Comments: See ITP Comments.

## 2017-10-27 NOTE — Telephone Encounter (Signed)
Patient reports her heart rate being high every time she goes to cardiac rehab and afterwards, they will have to hold her after sometimes until her heart rate comes down. She also reports her fit bit reading 103 when laying down at night. Patient unsure of heart rate readings before and after cardiac rehab she just knows that they are high. 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 Will notify Dr. Dulce Sellar.

## 2017-10-28 ENCOUNTER — Encounter (HOSPITAL_COMMUNITY)
Admission: RE | Admit: 2017-10-28 | Discharge: 2017-10-28 | Disposition: A | Discharge status: Home / Self Care | Payer: BC Managed Care – PPO | Source: Ambulatory Visit | Attending: Cardiology | Admitting: Cardiology

## 2017-10-28 ENCOUNTER — Ambulatory Visit (HOSPITAL_COMMUNITY): Payer: BC Managed Care – PPO

## 2017-10-28 DIAGNOSIS — Z952 Presence of prosthetic heart valve: Secondary | ICD-10-CM

## 2017-10-31 ENCOUNTER — Encounter: Payer: Self-pay | Admitting: Thoracic Surgery (Cardiothoracic Vascular Surgery)

## 2017-10-31 ENCOUNTER — Ambulatory Visit (HOSPITAL_COMMUNITY): Payer: BC Managed Care – PPO

## 2017-10-31 ENCOUNTER — Encounter (HOSPITAL_COMMUNITY): Payer: BC Managed Care – PPO

## 2017-10-31 ENCOUNTER — Ambulatory Visit: Payer: BC Managed Care – PPO | Admitting: Thoracic Surgery (Cardiothoracic Vascular Surgery)

## 2017-10-31 VITALS — BP 135/84 | HR 117 | Resp 20 | Ht 64.0 in | Wt 175.0 lb

## 2017-10-31 DIAGNOSIS — I350 Nonrheumatic aortic (valve) stenosis: Secondary | ICD-10-CM | POA: Diagnosis not present

## 2017-10-31 DIAGNOSIS — Z953 Presence of xenogenic heart valve: Secondary | ICD-10-CM | POA: Diagnosis not present

## 2017-10-31 NOTE — Progress Notes (Signed)
[address]       [name] [address]             [phone]     CARDIOTHORACIC SURGERY OFFICE NOTE  Referring Provider is Baldo Daub, MD PCP is Shirlean Mylar, MD   HPI:  Patient is a 62 year old female with chronic anxiety who returns to the office today for routine follow-up status post minimally invasive aortic valve replacement using a rapid deployment stented bovine pericardial tissue valve on July 26, 2017 for bicuspid aortic valve with severe symptomatic aortic stenosis.  The patient's early postoperative recovery was uneventful although she was noted to have left bundle branch block on postoperative EKG.  Initially beta-blockers were held and then atenolol was slowly resumed for sinus tachycardia.  Early following hospital discharge she presented with pleuritic chest wall pain consistent with musculoskeletal pain.  A CT scan revealed a trivial pericardial effusion.  She was treated with nonsteroidal anti-inflammatory drugs for presumed pericarditis.  She was last seen here in our office on August 23, 2017 at which time she was making slow but steady progress.  She was seen in follow-up by Dr. Dulce Sellar on September 12, 2017.  Her blood pressure was running borderline low at the time, and subsequently her atenolol was weaned off.  She returns to our office today and reports that she is doing very well.  Her only concern is that her baseline heart rate is relatively high and that has been concerning when she presents for routine outpatient cardiac rehab visits.  She states that her energy level is much better.  She does not have any pain in her chest.  She reports no shortness of breath.  She admits that she continues to suffer from severe chronic anxiety and she sometimes wakes up from her sleep at night feeling as though her heart is pounding.   Current Outpatient Medications  Medication Sig Dispense Refill  . acetaminophen (TYLENOL) 500 MG tablet Take 1,000 mg by mouth  every 6 (six) hours as needed.    [name] albuterol (PROVENTIL HFA;VENTOLIN HFA) 108 (90 Base) MCG/ACT inhaler Inhale 2 puffs into the lungs every 6 (six) hours as needed for wheezing or shortness of breath.    . ALPRAZolam (XANAX) 0.25 MG tablet Take 0.25 mg by mouth 2 (two) times daily.     [name] amoxicillin (AMOXIL) 500 MG capsule Take 2,000 mg (4 tablets) 45 minutes prior to dental procedures. 12 capsule 2  . aspirin EC 325 MG tablet Take 1 tablet (325 mg total) by mouth daily. (Patient taking differently: Take 162 mg by mouth daily. ) 30 tablet 0  . buPROPion (WELLBUTRIN XL) 150 MG 24 hr tablet Take 150 mg by mouth daily.    . calcium carbonate (OS-CAL) 600 MG TABS tablet Take 600 mg by mouth daily.     . cyclobenzaprine (FLEXERIL) 10 MG tablet TK 1 T PO QD HS PRN MUSCLE TIGHTNESS  1  . fluticasone (FLONASE) 50 MCG/ACT nasal spray Place 1 spray into both nostrils daily as needed.     . Fluticasone-Salmeterol (ADVAIR) 250-50 MCG/DOSE AEPB Inhale 1 puff into the lungs 2 (two) times daily.    . meloxicam (MOBIC) 15 MG tablet TK 1 T PO QD IN THE MORNING WF  1  . mirtazapine (REMERON) 45 MG tablet Take 45 mg by mouth at bedtime.    . Multiple Vitamin (MULTIVITAMIN) capsule Take 1 capsule by mouth daily.    [name] omeprazole (PRILOSEC)  20 MG capsule Take 20 mg by mouth daily.     No current facility-administered medications for this visit.       Physical Exam:   BP 135/84   Pulse (!) 117   Resp 20   Ht 5\' 4"  (1.626 m)   Wt 175 lb (79.4 kg)   SpO2 95% Comment: RA  BMI 30.04 kg/m   General:  Well-appearing  Chest:   Clear to auscultation  CV:   Tachycardic but regular rate with no murmur  Incisions:  Completely healed  Abdomen:  Soft nontender  Extremities:  Warm and well-perfused  Diagnostic Tests:  Transthoracic Echocardiography  Patient:    Miranda Berger, Road[place] MR #:       [id number] Study Date: 09/27/2017 Gender:     F Age:        66 Height:     162.6 cm Weight:     79.4 kg BSA:        1.92  m^2 Pt. Status: Room:   ATTENDING    Norman Herrlich, MD  ORDERING     Norman Herrlich, MD  REFERRING    Norman Herrlich, MD  PERFORMING   [facility] [place]  SONOGRAPHER  Sinda Du, RDCS  cc:  ------------------------------------------------------------------- LV EF: 50% -   55%  ------------------------------------------------------------------- Indications:      Pericardial effusion 423.9.  ------------------------------------------------------------------- History:   PMH:  LBBB. Pericarditis S/P Aortic valve Replacement. Risk factors:  Hypertension.  ------------------------------------------------------------------- Study Conclusions  - Left ventricle: The cavity size was normal. Wall thickness was   increased in a pattern of mild LVH. Systolic function was normal.   The estimated ejection fraction was in the range of 50% to 55%.   Wall motion was normal; there were no regional wall motion   abnormalities. Doppler parameters are consistent with abnormal   left ventricular relaxation (grade 1 diastolic dysfunction). - Aortic valve: A bioprosthesis was present, normal appearance and   doppler profile Valve area (VTI): 1.19 cm^2. Valve area (Vmax):   1.05 cm^2. Valve area (Vmean): 1.24 cm^2. - Mitral valve: There was mild regurgitation. - Pericardium, extracardiac: There was no pericardial effusion.  ------------------------------------------------------------------- Study data:  Comparison was made to the study of 06/14/2017.  Study status:  Routine.  Procedure:  The patient&'s pain level was 2 on a scale of 1 to 10. Transthoracic echocardiography. Image quality was adequate.  Study completion:  There were no complications. Transthoracic echocardiography.  M-mode, complete 2D, spectral Doppler, and color Doppler.  Birthdate:  Patient birthdate: [birth year].  Age:  Patient is 62 yr old.  Sex:  Gender: female. BMI: 30 kg/m^2.  Blood pressure:     104/72  Patient  status: Outpatient.  Study date:  Study date: 09/27/2017. Study time: 10:40 AM.  Location:  Echo laboratory.  -------------------------------------------------------------------  ------------------------------------------------------------------- Left ventricle:  The cavity size was normal. Wall thickness was increased in a pattern of mild LVH. Systolic function was normal. The estimated ejection fraction was in the range of 50% to 55%. Wall motion was normal; there were no regional wall motion abnormalities. Doppler parameters are consistent with abnormal left ventricular relaxation (grade 1 diastolic dysfunction). There was no evidence of elevated ventricular filling pressure by Doppler parameters.  ------------------------------------------------------------------- Aortic valve:   Normal thickness leaflets. A bioprosthesis was present, normal appearance and doppler profile Cusp separation was normal. Mobility was not restricted.  Doppler:  Transvalvular velocity was within the normal range. There was no stenosis. There was no regurgitation.  VTI ratio of LVOT to aortic valve: 0.53. Valve area (VTI): 1.19 cm^2. Indexed valve area (VTI): 0.62 cm^2/m^2. Peak velocity ratio of LVOT to aortic valve: 0.46. Valve area (Vmax): 1.05 cm^2. Indexed valve area (Vmax): 0.55 cm^2/m^2. Mean velocity ratio of LVOT to aortic valve: 0.55. Valve area (Vmean): 1.24 cm^2. Indexed valve area (Vmean): 0.65 cm^2/m^2. Mean gradient (S): 6 mm Hg. Peak gradient (S): 11 mm Hg.  ------------------------------------------------------------------- Aorta:  Aortic root: The aortic root was normal in size.  ------------------------------------------------------------------- Mitral valve:   Structurally normal valve.   Mobility was not restricted.  Doppler:  Transvalvular velocity was within the normal range. There was no evidence for stenosis. There was mild regurgitation.    Peak gradient (D): 2 mm  Hg.  ------------------------------------------------------------------- Left atrium:  The atrium was normal in size.  ------------------------------------------------------------------- Atrial septum:  The septum was normal.  ------------------------------------------------------------------- Right ventricle:  The cavity size was normal. Wall thickness was normal. Systolic function was normal.  ------------------------------------------------------------------- Pulmonic valve:    Structurally normal valve.   Cusp separation was normal.  Doppler:  Transvalvular velocity was within the normal range. There was no evidence for stenosis. There was no regurgitation.  ------------------------------------------------------------------- Tricuspid valve:   Structurally normal valve.    Doppler: Transvalvular velocity was within the normal range. There was mild regurgitation.  ------------------------------------------------------------------- Pulmonary artery:   The main pulmonary artery was normal-sized. Systolic pressure was within the normal range.  ------------------------------------------------------------------- Right atrium:  The atrium was normal in size.  ------------------------------------------------------------------- Pericardium:  The pericardium was normal in appearance. There was no pericardial effusion.  ------------------------------------------------------------------- Systemic veins: Inferior vena cava: The vessel was normal in size. The respirophasic diameter changes were in the normal range (= 50%), consistent with normal central venous pressure.  ------------------------------------------------------------------- Measurements   Left ventricle                           Value          Reference  LV ID, ED, PLAX chordal                  43.1  mm       43 - 52  LV ID, ES, PLAX chordal                  26.4  mm       23 - 38  LV fx shortening, PLAX  chordal           39    %        >=29  LV PW thickness, ED                      12.2  mm       ----------  IVS/LV PW ratio, ED                      1.11           <=1.3  Stroke volume, 2D                        33    ml       ----------  Stroke volume/bsa, 2D                    17    ml/m^2   ----------  LV e&', lateral  8.59  cm/s     ----------  LV E/e&', lateral                         8.72           ----------  LV e&', medial                            7.83  cm/s     ----------  LV E/e&', medial                          9.57           ----------  LV e&', average                           8.21  cm/s     ----------  LV E/e&', average                         9.12           ----------    Ventricular septum                       Value          Reference  IVS thickness, ED                        13.5  mm       ----------    LVOT                                     Value          Reference  LVOT ID, S                               17    mm       ----------  LVOT area                                2.27  cm^2     ----------  LVOT peak velocity, S                    76.7  cm/s     ----------  LVOT mean velocity, S                    57.9  cm/s     ----------  LVOT VTI, S                              14.4  cm       ----------    Aortic valve                             Value          Reference  Aortic valve peak velocity, S            166   cm/s     ----------  Aortic valve  mean velocity, S            106   cm/s     ----------  Aortic valve VTI, S                      27.4  cm       ----------  Aortic mean gradient, S                  6     mm Hg    ----------  Aortic peak gradient, S                  11    mm Hg    ----------  VTI ratio, LVOT/AV                       0.53           ----------  Aortic valve area, VTI                   1.19  cm^2     ----------  Aortic valve area/bsa, VTI               0.62  cm^2/m^2 ----------  Velocity ratio, peak, LVOT/AV             0.46           ----------  Aortic valve area, peak velocity         1.05  cm^2     ----------  Aortic valve area/bsa, peak              0.55  cm^2/m^2 ----------  velocity  Velocity ratio, mean, LVOT/AV            0.55           ----------  Aortic valve area, mean velocity         1.24  cm^2     ----------  Aortic valve area/bsa, mean              0.65  cm^2/m^2 ----------  velocity    Aorta                                    Value          Reference  Aortic root ID, ED                       22    mm       ----------  Ascending aorta ID, A-P, S               30    mm       ----------    Left atrium                              Value          Reference  LA ID, A-P, ES                           30    mm       ----------  LA ID/bsa, A-P  1.56  cm/m^2   <=2.2  LA volume, S                             32.7  ml       ----------  LA volume/bsa, S                         17    ml/m^2   ----------  LA volume, ES, 1-p A4C                   27.2  ml       ----------  LA volume/bsa, ES, 1-p A4C               14.2  ml/m^2   ----------  LA volume, ES, 1-p A2C                   36.2  ml       ----------  LA volume/bsa, ES, 1-p A2C               18.9  ml/m^2   ----------    Mitral valve                             Value          Reference  Mitral E-wave peak velocity              74.9  cm/s     ----------  Mitral A-wave peak velocity              102   cm/s     ----------  Mitral deceleration time         (L)     99    ms       150 - 230  Mitral peak gradient, D                  2     mm Hg    ----------  Mitral E/A ratio, peak                   1.6            ----------    Pulmonary arteries                       Value          Reference  PA pressure, S, DP                       22    mm Hg    <=30    Tricuspid valve                          Value          Reference  Tricuspid regurg peak velocity           216   cm/s     ----------  Tricuspid peak RV-RA gradient             19    mm Hg    ----------    Right atrium  Value          Reference  RA ID, S-I, ES, A4C                      43.5  mm       34 - 49  RA area, ES, A4C                         15.4  cm^2     8.3 - 19.5  RA volume, ES, A/L                       43.8  ml       ----------  RA volume/bsa, ES, A/L                   22.8  ml/m^2   ----------    Systemic veins                           Value          Reference  Estimated CVP                            3     mm Hg    ----------    Right ventricle                          Value          Reference  RV ID, minor axis, ED, A4C base          34.2  mm       ----------  TAPSE                                    15.1  mm       ----------  RV pressure, S, DP                       22    mm Hg    <=30  RV s&', lateral, S                        10.7  cm/s     ----------  Legend: (L)  and  (H)  mark values outside specified reference range.  ------------------------------------------------------------------- Prepared and Electronically Authenticated by  Norman Herrlich, MD 2019-08-06T13:29:53   2 channel telemetry rhythm strip demonstrates sinus tachycardia  Impression:  Patient is doing well approximately 3 months status post aortic valve replacement using a rapid deployment stented bovine pericardial tissue valve.  I have personally reviewed the patient's follow-up echocardiogram which was performed 6 weeks ago.  There is normal left ventricular function with normal functioning bioprosthetic tissue valve in aortic position.  There is no sign of paravalvular leak in the transvalvular gradient is quite low.  There is no significant pericardial effusion    Plan:  I have encouraged the patient to continue to gradually increase her physical activity as tolerated.  We have not recommended any changes to her current medications and will defer whether or not it would be reasonable to consider resuming low-dose beta-blocker to  the discretion of Dr. Dulce Sellar.  The patient has been reminded regarding the  importance of dental hygiene and the lifelong need for antibiotic prophylaxis for all dental cleanings and other related invasive procedures.  The patient will continue to follow-up regularly with Dr. Dulce Sellar and return to our office for follow-up next June, approximately 1 year following her surgery.  She will call and return sooner should specific problems or questions arise.   I spent in excess of 15 minutes during the conduct of this office consultation and >50% of this time involved direct face-to-face encounter with the patient for counseling and/or coordination of their care.    Salvatore Decent. Cornelius Moras, MD 10/31/2017 1:48 PM

## 2017-10-31 NOTE — Patient Instructions (Signed)

## 2017-11-02 ENCOUNTER — Encounter (HOSPITAL_COMMUNITY): Payer: BC Managed Care – PPO

## 2017-11-02 ENCOUNTER — Ambulatory Visit (HOSPITAL_COMMUNITY): Payer: BC Managed Care – PPO

## 2017-11-04 ENCOUNTER — Ambulatory Visit (HOSPITAL_COMMUNITY): Payer: BC Managed Care – PPO

## 2017-11-04 ENCOUNTER — Encounter (HOSPITAL_COMMUNITY): Payer: BC Managed Care – PPO

## 2017-11-07 ENCOUNTER — Ambulatory Visit (HOSPITAL_COMMUNITY): Payer: BC Managed Care – PPO

## 2017-11-07 ENCOUNTER — Encounter (HOSPITAL_COMMUNITY)
Admission: RE | Admit: 2017-11-07 | Discharge: 2017-11-07 | Disposition: A | Discharge status: Home / Self Care | Payer: BC Managed Care – PPO | Source: Ambulatory Visit | Attending: Cardiology | Admitting: Cardiology

## 2017-11-07 DIAGNOSIS — Z952 Presence of prosthetic heart valve: Secondary | ICD-10-CM

## 2017-11-09 ENCOUNTER — Ambulatory Visit (HOSPITAL_COMMUNITY): Payer: BC Managed Care – PPO

## 2017-11-09 ENCOUNTER — Encounter (HOSPITAL_COMMUNITY)
Admission: RE | Admit: 2017-11-09 | Discharge: 2017-11-09 | Disposition: A | Discharge status: Home / Self Care | Payer: BC Managed Care – PPO | Source: Ambulatory Visit | Attending: Cardiology | Admitting: Cardiology

## 2017-11-09 DIAGNOSIS — Z952 Presence of prosthetic heart valve: Secondary | ICD-10-CM

## 2017-11-11 ENCOUNTER — Ambulatory Visit (HOSPITAL_COMMUNITY): Payer: BC Managed Care – PPO

## 2017-11-11 ENCOUNTER — Encounter (HOSPITAL_COMMUNITY): Payer: BC Managed Care – PPO

## 2017-11-14 ENCOUNTER — Ambulatory Visit (HOSPITAL_COMMUNITY): Payer: BC Managed Care – PPO

## 2017-11-14 ENCOUNTER — Encounter (HOSPITAL_COMMUNITY): Payer: BC Managed Care – PPO

## 2017-11-16 ENCOUNTER — Encounter (HOSPITAL_COMMUNITY): Payer: BC Managed Care – PPO

## 2017-11-16 ENCOUNTER — Ambulatory Visit (HOSPITAL_COMMUNITY): Payer: BC Managed Care – PPO

## 2017-11-17 ENCOUNTER — Telehealth (HOSPITAL_COMMUNITY): Payer: Self-pay | Admitting: *Deleted

## 2017-11-17 NOTE — Telephone Encounter (Signed)
Pt called to inquire about absences from CR since returning from vacation that was 9/11 & 9/13.  Left VM on mobile number to call back.

## 2017-11-18 ENCOUNTER — Encounter (HOSPITAL_COMMUNITY): Payer: BC Managed Care – PPO

## 2017-11-18 ENCOUNTER — Ambulatory Visit (HOSPITAL_COMMUNITY): Payer: BC Managed Care – PPO

## 2017-11-21 ENCOUNTER — Ambulatory Visit (HOSPITAL_COMMUNITY): Payer: BC Managed Care – PPO

## 2017-11-21 ENCOUNTER — Encounter (HOSPITAL_COMMUNITY): Payer: BC Managed Care – PPO

## 2017-11-21 ENCOUNTER — Telehealth: Payer: Self-pay | Admitting: Cardiology

## 2017-11-21 NOTE — Telephone Encounter (Signed)
Pt called to inquire about continued absences. Last visit to CR 10/28/17.  Pt states that she is having difficulty walking due to some issues with her knee as well as feeling lightheaded and dizzy.  She has called her MD office.  Informed pt that per telephone note MD office had called patient and left message to call office.  At this time due to ongoing medical issues and lack of attendance patient will be discharged from CR program.

## 2017-11-21 NOTE — Telephone Encounter (Signed)
Patient reports being dizzy since taking benadryl for "itching eyes" this morning. She reports her blood pressure 102/77 and her resting heart rate is 102. Patient concerned and want's advice from Dr. Dulce Sellar, will consult with him.

## 2017-11-21 NOTE — Telephone Encounter (Signed)
I would avoid benadryl, if needed claritin or zyrtec are less likely to cause this

## 2017-11-21 NOTE — Telephone Encounter (Signed)
Has been having issues with BP, lightheadedness and dizziness, took some allergy medicine and didn't know if that had something to do with it

## 2017-11-21 NOTE — Telephone Encounter (Signed)
Informed patient of Dr. Hulen Shouts recommendation. Patient verbally understands

## 2017-11-21 NOTE — Telephone Encounter (Signed)
Left message for patient to return call.

## 2017-11-23 ENCOUNTER — Encounter (HOSPITAL_COMMUNITY): Payer: BC Managed Care – PPO

## 2017-11-23 ENCOUNTER — Ambulatory Visit (HOSPITAL_COMMUNITY): Payer: BC Managed Care – PPO

## 2017-11-23 NOTE — Addendum Note (Signed)
Encounter addended by: York Cerise on: 11/23/2017 11:10 AM  Actions taken: Visit Navigator Flowsheet section accepted

## 2017-11-24 NOTE — Addendum Note (Signed)
Encounter addended by: Enid Skeens, RD on: 11/24/2017 8:02 AM  Actions taken: Flowsheet data copied forward, Visit Navigator Flowsheet section accepted

## 2017-11-24 NOTE — Addendum Note (Signed)
Encounter addended by: Nikki Dom, RN on: 11/24/2017 4:06 PM  Actions taken: Visit Navigator Flowsheet section accepted, Sign clinical note

## 2017-11-24 NOTE — Addendum Note (Signed)
Encounter addended by: Nikki Dom, RN on: 11/24/2017 4:10 PM  Actions taken: Episode resolved

## 2017-11-24 NOTE — Progress Notes (Signed)
Discharge Progress Report  Patient Details  Name: Miranda Berger MRN: [id number] Date of Birth: [birth year] Referring Provider:     CARDIAC REHAB PHASE II ORIENTATION from 10/06/2017 in [facility] CARDIAC [facility]  Referring Provider  Norman Herrlich Md       Number of Visits: 7  Reason for Discharge:  Early Exit:  Lack of attendance  Smoking History:  Social History   Tobacco Use  Smoking Status Never Smoker  Smokeless Tobacco Never Used    Diagnosis:  S/P AVR (aortic valve replacement) S/P minimally invasive 07/26/17  ADL [facility]SD:   Initial Exercise Prescription: Initial Exercise Prescription - 10/06/17 1200      Date of Initial Exercise RX and Referring Provider   Date  10/06/17    Referring Provider  Norman Herrlich Md      Treadmill   MPH  2.8    Grade  1    Minutes  10    METs  3.53      Bike   Level  2.5   upright scifit   Minutes  10    METs  3      NuStep   Level  3    SPM  80    Minutes  10    METs  2      Prescription Details   Frequency (times per week)  3    Duration  Progress to 30 minutes of continuous aerobic without signs/symptoms of physical distress      Intensity   THRR 40-80% of Max Heartrate  64-127    Ratings of Perceived Exertion  11-13    Perceived Dyspnea  0-4      Progression   Progression  Continue to progress workloads to maintain intensity without signs/symptoms of physical distress.      Resistance Training   Training Prescription  Yes    Weight  2lbs    Reps  10-15       Discharge Exercise Prescription (Final Exercise Prescription Changes): Exercise Prescription Changes - 10/28/17 1106      Response to Exercise   Blood Pressure (Admit)  112/82    Blood Pressure (Exercise)  122/76    Blood Pressure (Exit)  112/60    Heart Rate (Admit)  100 bpm    Heart Rate (Exercise)  134 bpm    Heart Rate (Exit)  101 bpm    Rating of Perceived Exertion (Exercise)  13    Perceived Dyspnea (Exercise)  0    Symptoms   None     Comments  None    Duration  Continue with 30 min of aerobic exercise without signs/symptoms of physical distress.    Intensity  THRR unchanged      Progression   Progression  Continue to progress workloads to maintain intensity without signs/symptoms of physical distress.    Average METs  3.04      Resistance Training   Training Prescription  Yes    Weight  3lbs    Reps  10-15    Time  10 Minutes      Interval Training   Interval Training  No      Treadmill   MPH  2.8    Grade  1    Minutes  10    METs  3.53      Bike   Level  2.5    Minutes  10    METs  2.8      NuStep  Level  3    SPM  85    Minutes  10    METs  2.8       Functional Capacity: 6 Minute Walk    Row Name 10/06/17 1224         6 Minute Walk   Phase  Initial     Distance  1693 feet     Walk Time  6 minutes     # of Rest Breaks  0     MPH  3.2     METS  4.1     RPE  12     Symptoms  No     Resting HR  92 bpm     Resting BP  124/72     Resting Oxygen Saturation   97 %     Exercise Oxygen Saturation  during 6 min walk  98 %     Max Ex. HR  129 bpm     Max Ex. BP  126/80     2 Minute Post BP  128/80        Psychological, QOL, Others - Outcomes: PHQ 2/9: Depression screen PHQ 2/9 10/12/2017  Decreased Interest 0  Down, Depressed, Hopeless 0  PHQ - 2 Score 0    Quality of Life: Quality of Life - 10/06/17 1234      Quality of Life   Select  Quality of Life      Quality of Life Scores   Health/Function Pre  22 %    Socioeconomic Pre  30 %    Psych/Spiritual Pre  26.57 %    Family Pre  20.4 %    GLOBAL Pre  24.35 %       Personal Goals: Goals established at orientation with interventions provided to work toward goal. Personal Goals and Risk Factors at Admission - 10/06/17 1635      Core Components/Risk Factors/Patient Goals on Admission    Weight Management  Weight Maintenance;Weight Loss;Yes    Intervention  Weight Management: Develop a combined nutrition and  exercise program designed to reach desired caloric intake, while maintaining appropriate intake of nutrient and fiber, sodium and fats, and appropriate energy expenditure required for the weight goal.;Weight Management: Provide education and appropriate resources to help participant work on and attain dietary goals.;Weight Management/Obesity: Establish reasonable short term and long term weight goals.    Goal Weight: Short Term  150 lb (68 kg)    Goal Weight: Long Term  145 lb (65.8 kg)    Expected Outcomes  Short Term: Continue to assess and modify interventions until short term weight is achieved;Weight Maintenance: Understanding of the daily nutrition guidelines, which includes 25-35% calories from fat, 7% or less cal from saturated fats, less than 200mg  cholesterol, less than 1.5gm of sodium, & 5 or more servings of fruits and vegetables daily;Weight Loss: Understanding of general recommendations for a balanced deficit meal plan, which promotes 1-2 lb weight loss per week and includes a negative energy balance of [phone] kcal/d;Understanding recommendations for meals to include 15-35% energy as protein, 25-35% energy from fat, 35-60% energy from carbohydrates, less than 200mg  of dietary cholesterol, 20-35 gm of total fiber daily;Understanding of distribution of calorie intake throughout the day with the consumption of 4-5 meals/snacks    Hypertension  Yes    Intervention  Provide education on lifestyle modifcations including regular physical activity/exercise, weight management, moderate sodium restriction and increased consumption of fresh fruit, vegetables, and low fat dairy, alcohol moderation, and smoking  cessation.;Monitor prescription use compliance.    Expected Outcomes  Short Term: Continued assessment and intervention until BP is < 140/73mm HG in hypertensive participants. < 130/29mm HG in hypertensive participants with diabetes, heart failure or chronic kidney disease.;Long Term: Maintenance of  blood pressure at goal levels.    Stress  Yes    Intervention  Offer individual and/or small group education and counseling on adjustment to heart disease, stress management and health-related lifestyle change. Teach and support self-help strategies.    Expected Outcomes  Short Term: Participant demonstrates changes in health-related behavior, relaxation and other stress management skills, ability to obtain effective social support, and compliance with psychotropic medications if prescribed.;Long Term: Emotional wellbeing is indicated by absence of clinically significant psychosocial distress or social isolation.        Personal Goals Discharge: Goals and Risk Factor Review    Row Name 10/12/17 0830 10/21/17 0817           Core Components/Risk Factors/Patient Goals Review   Personal Goals Review  Weight Management/Obesity;Hypertension;Stress  Weight Management/Obesity;Hypertension;Stress      Review  Pt with multiple CAD RFs willing to participate in CR exercise. Gazelle would like to develop an exercise routine and without fear.  Pt with multiple CAD RFs willing to participate in CR exercise. Mika would like to develop an exercise routine and without fear.      Expected Outcomes  Crystelle will continue to participate in CR exercise, nutrition, and lifestyle modification opportunities.  Mirissa will continue to participate in CR exercise, nutrition, and lifestyle modification opportunities.         Exercise Goals and Review: Exercise Goals    Row Name 10/06/17 0909             Exercise Goals   Increase Physical Activity  Yes       Intervention  Provide advice, education, support and counseling about physical activity/exercise needs.;Develop an individualized exercise prescription for aerobic and resistive training based on initial evaluation findings, risk stratification, comorbidities and participant's personal goals.       Expected Outcomes  Short Term: Attend rehab on a regular basis to  increase amount of physical activity.;Long Term: Exercising regularly at least 3-5 days a week.;Long Term: Add in home exercise to make exercise part of routine and to increase amount of physical activity.       Increase Strength and Stamina  Yes be able to walk without fear of having another cardiac event and without knee pain       Intervention  Provide advice, education, support and counseling about physical activity/exercise needs.;Develop an individualized exercise prescription for aerobic and resistive training based on initial evaluation findings, risk stratification, comorbidities and participant's personal goals.       Expected Outcomes  Short Term: Increase workloads from initial exercise prescription for resistance, speed, and METs.;Short Term: Perform resistance training exercises routinely during rehab and add in resistance training at home;Long Term: Improve cardiorespiratory fitness, muscular endurance and strength as measured by increased METs and functional capacity ( )       Able to understand and use rate of perceived exertion (RPE) scale  Yes       Intervention  Provide education and explanation on how to use RPE scale       Expected Outcomes  Short Term: Able to use RPE daily in rehab to express subjective intensity level;Long Term:  Able to use RPE to guide intensity level when exercising independently       Knowledge  and understanding of Target Heart Rate Range (THRR)  Yes       Intervention  Provide education and explanation of THRR including how the numbers were predicted and where they are located for reference       Expected Outcomes  Short Term: Able to state/look up THRR;Long Term: Able to use THRR to govern intensity when exercising independently;Short Term: Able to use daily as guideline for intensity in rehab       Able to check pulse independently  Yes       Intervention  Provide education and demonstration on how to check pulse in carotid and radial arteries.;Review  the importance of being able to check your own pulse for safety during independent exercise       Expected Outcomes  Short Term: Able to explain why pulse checking is important during independent exercise;Long Term: Able to check pulse independently and accurately       Understanding of Exercise Prescription  Yes       Intervention  Provide education, explanation, and written materials on patient's individual exercise prescription       Expected Outcomes  Short Term: Able to explain program exercise prescription;Long Term: Able to explain home exercise prescription to exercise independently          Nutrition & Weight - Outcomes: Pre Biometrics - 10/06/17 1226      Pre Biometrics   Height  5\' 4"  (1.626 m)    Weight  80.7 kg    Waist Circumference  37 inches    Hip Circumference  43 inches    Waist to Hip Ratio  0.86 %    BMI (Calculated)  30.52    Triceps Skinfold  40 mm    % Body Fat  42.6 %    Grip Strength  30 kg    Flexibility  13 in    Single Leg Stand  30 seconds        Nutrition: Nutrition Therapy & Goals - 11/24/17 0801      Nutrition Therapy   Diet  heart healthy      Personal Nutrition Goals   Nutrition Goal  Pt to identify and limit food sources of saturated fat, trans fat, refined carbohydrates.    Personal Goal #2  Pt to identify food quantities necessary to achieve weight loss of 6-24 lbs. at graduation from cardiac rehab.      Intervention Plan   Intervention  Prescribe, educate and counsel regarding individualized specific dietary modifications aiming towards targeted core components such as weight, hypertension, lipid management, diabetes, heart failure and other comorbidities.    Expected Outcomes  Short Term Goal: Understand basic principles of dietary content, such as calories, fat, sodium, cholesterol and nutrients.       Nutrition Discharge: Nutrition Assessments - 11/24/17 0759      MEDFICTS Scores   Pre Score  18    Post Score  --   pt did not  return       Education Questionnaire Score: Knowledge Questionnaire Score - 10/06/17 1242      Knowledge Questionnaire Score   Pre Score  21/24       Goals reviewed with patient; copy given to patient.

## 2017-11-25 ENCOUNTER — Ambulatory Visit (HOSPITAL_COMMUNITY): Payer: BC Managed Care – PPO

## 2017-11-25 ENCOUNTER — Encounter (HOSPITAL_COMMUNITY): Payer: BC Managed Care – PPO

## 2017-11-28 ENCOUNTER — Ambulatory Visit (HOSPITAL_COMMUNITY): Payer: BC Managed Care – PPO

## 2017-11-28 ENCOUNTER — Encounter (HOSPITAL_COMMUNITY): Payer: BC Managed Care – PPO

## 2017-11-30 ENCOUNTER — Ambulatory Visit (HOSPITAL_COMMUNITY): Payer: BC Managed Care – PPO

## 2017-11-30 ENCOUNTER — Encounter (HOSPITAL_COMMUNITY): Payer: BC Managed Care – PPO

## 2017-12-02 ENCOUNTER — Ambulatory Visit (HOSPITAL_COMMUNITY): Payer: BC Managed Care – PPO

## 2017-12-02 ENCOUNTER — Encounter (HOSPITAL_COMMUNITY): Payer: BC Managed Care – PPO

## 2017-12-05 ENCOUNTER — Ambulatory Visit (HOSPITAL_COMMUNITY): Payer: BC Managed Care – PPO

## 2017-12-05 ENCOUNTER — Encounter (HOSPITAL_COMMUNITY): Payer: BC Managed Care – PPO

## 2017-12-07 ENCOUNTER — Encounter (HOSPITAL_COMMUNITY): Payer: BC Managed Care – PPO

## 2017-12-07 ENCOUNTER — Ambulatory Visit (HOSPITAL_COMMUNITY): Payer: BC Managed Care – PPO

## 2017-12-09 ENCOUNTER — Ambulatory Visit (HOSPITAL_COMMUNITY): Payer: BC Managed Care – PPO

## 2017-12-09 ENCOUNTER — Encounter (HOSPITAL_COMMUNITY): Payer: BC Managed Care – PPO

## 2017-12-12 ENCOUNTER — Ambulatory Visit (HOSPITAL_COMMUNITY): Payer: BC Managed Care – PPO

## 2017-12-12 ENCOUNTER — Encounter (HOSPITAL_COMMUNITY): Payer: BC Managed Care – PPO

## 2017-12-12 ENCOUNTER — Telehealth: Payer: Self-pay | Admitting: *Deleted

## 2017-12-12 NOTE — Telephone Encounter (Signed)
Pt is having chest pain, has been on and off for last month but is worse today. Please advise.

## 2017-12-12 NOTE — Telephone Encounter (Signed)
Patient called again regarding chest pain

## 2017-12-12 NOTE — Telephone Encounter (Signed)
Patient states she still experiences chest pain off and on at times. She describes left chest muscular pain that brings her to tears with movement every now and then. Dr. Dulce Sellar advised to take ibuprofen or aleve as needed for chest pain and she will follow up in our office next Friday, 12/23/17 at 1:20 pm. Patient and patient's husband, Miranda Berger, verbalized understanding. No further questions.

## 2017-12-14 ENCOUNTER — Ambulatory Visit (HOSPITAL_COMMUNITY): Payer: BC Managed Care – PPO

## 2017-12-14 ENCOUNTER — Encounter (HOSPITAL_COMMUNITY): Payer: BC Managed Care – PPO

## 2017-12-16 ENCOUNTER — Ambulatory Visit (HOSPITAL_COMMUNITY): Payer: BC Managed Care – PPO

## 2017-12-16 ENCOUNTER — Encounter (HOSPITAL_COMMUNITY): Payer: BC Managed Care – PPO

## 2017-12-19 ENCOUNTER — Encounter (HOSPITAL_COMMUNITY): Payer: BC Managed Care – PPO

## 2017-12-19 ENCOUNTER — Ambulatory Visit (HOSPITAL_COMMUNITY): Payer: BC Managed Care – PPO

## 2017-12-21 ENCOUNTER — Ambulatory Visit (HOSPITAL_COMMUNITY): Payer: BC Managed Care – PPO

## 2017-12-21 ENCOUNTER — Encounter (HOSPITAL_COMMUNITY): Payer: BC Managed Care – PPO

## 2017-12-21 ENCOUNTER — Ambulatory Visit: Payer: BC Managed Care – PPO | Admitting: Cardiology

## 2017-12-23 ENCOUNTER — Ambulatory Visit (HOSPITAL_COMMUNITY): Payer: BC Managed Care – PPO

## 2017-12-23 ENCOUNTER — Encounter: Payer: Self-pay | Admitting: Cardiology

## 2017-12-23 ENCOUNTER — Encounter (HOSPITAL_COMMUNITY): Payer: BC Managed Care – PPO

## 2017-12-23 ENCOUNTER — Ambulatory Visit: Payer: BC Managed Care – PPO | Admitting: Cardiology

## 2017-12-23 VITALS — BP 128/82 | HR 94 | Ht 63.0 in | Wt 176.0 lb

## 2017-12-23 DIAGNOSIS — I447 Left bundle-branch block, unspecified: Secondary | ICD-10-CM

## 2017-12-23 DIAGNOSIS — Z953 Presence of xenogenic heart valve: Secondary | ICD-10-CM | POA: Diagnosis not present

## 2017-12-23 DIAGNOSIS — R002 Palpitations: Secondary | ICD-10-CM

## 2017-12-23 MED ORDER — ACEBUTOLOL HCL 200 MG PO CAPS: 200.0000 mg | ORAL_CAPSULE | Freq: Every day | ORAL | 3 refills | Status: DC

## 2017-12-23 NOTE — Progress Notes (Signed)
Cardiology Office Note:    Date:  12/23/2017   ID:  Miranda Berger, DOB 02/20/1956, MRN [id number]  PCP:  Shirlean Mylar, MD  Cardiologist:  Norman Herrlich, MD    Referring MD: Shirlean Mylar, MD    ASSESSMENT:    1. Palpitation   2. LBBB (left bundle branch block)   3. 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    PLAN:    In order of problems listed above:  1. I suspect this is multifactorial and more aware numbness of her heart as opposed to abnormal heart rhythm.  I have asked her to wear a extended monitor for 1 week and then I will place her on a low-dose of a selective beta-blocker to alleviate symptoms I think that her long-acting maintenance inhaler and Remeron are playing a role accelerating resting heart rate.  She has no indication of pericarditis or valve dysfunction and I do not think she needs extensive cardiac imaging at this time. 2. Stable remains in sinus rhythm will use a 1 week ambulatory heart rhythm monitor 3. Stable good recovery she has been recently seen by CT surgery   Next appointment: 6 weeks   Medication Adjustments/Labs and Tests Ordered: Current medicines are reviewed at length with the patient today.  Concerns regarding medicines are outlined above.  Orders Placed This Encounter  Procedures  . LONG TERM MONITOR (3-14 DAYS)  . EKG 12-Lead   Meds ordered this encounter  Medications  . acebutolol (SECTRAL) 200 MG capsule    Sig: Take 1 capsule (200 mg total) by mouth daily.    Dispense:  30 capsule    Refill:  3    No chief complaint on file.   History of Present Illness:    Miranda Berger is a 62 y.o. female with a hx of bicus[pid aortic valve, aortic stenosis and recent SAVR 07/26/2017 and new LBBB after surgery last seen 08/09/2017. She had pleuritic chest pain and CTA showed small left pleural and trace pericardial effusion. CBC and CMP were normal sed rate 65. last seen 09/12/17. She was seen by CTS in October with a  good visit and at that time had no complaint of chest pain.   12/23/17 10/31/17 10/17/17  Vitals     BP 128/82 135/84 124/86  Pulse Rate 94 117 108    Compliance with diet, lifestyle and medications: Yes  She is been aware of her harsh at times it feels forceful but not as normal and resting heart rate is running in the range of the high 90s to she takes 2 medications in the consent can accelerate heart rate her long-acting maintenance bronchodilator as well as Remeron.  She is not having chest pain or pericardial discomfort and has had no syncope.  I asked her where 1 week monitor to exclude atrial arrhythmia and if we need to use a low-dose of a selective beta-blocker to mitigate symptoms.  She also is having her anxiety addressed through her primary care physician Past Medical History:  Diagnosis Date  . Anemia    in the past   . Anxiety   . Asthma   . Depression   . Family history of adverse reaction to anesthesia    mom had nausea  . GERD (gastroesophageal reflux disease)   . Hepatitis    Hepatitis A in 3rd grade?  [name] Hypertension   . PONV (postoperative nausea and vomiting)   . S/P minimally invasive aortic valve replacement with bioprosthetic  valve 07/26/2017   [employer], size 23  . Sleep apnea    uses CPAP, 12, starts at 6    Past Surgical History:  Procedure Laterality Date  . ABDOMINAL HYSTERECTOMY    . AORTIC VALVE REPLACEMENT N/A 07/26/2017   Procedure: MINIMALLY INVASIVE AORTIC VALVE REPLACEMENT (AVR);  Surgeon: Purcell Nails, MD;  Location: [facility] OR;  Service: Open Heart Surgery;  Laterality: N/A;  . BLADDER SURGERY  2011  . CARDIAC CATHETERIZATION    . CARPAL TUNNEL RELEASE  2012  . CHOLECYSTECTOMY    . RIGHT/LEFT HEART CATH AND CORONARY ANGIOGRAPHY N/A 07/08/2017   Procedure: RIGHT/LEFT HEART CATH AND CORONARY ANGIOGRAPHY;  Surgeon: Tonny Bollman, MD;  Location: [facility] INVASIVE CV LAB;  Service: Cardiovascular;  Laterality: N/A;  . TEE WITHOUT CARDIOVERSION  N/A 07/26/2017   Procedure: TRANSESOPHAGEAL ECHOCARDIOGRAM (TEE);  Surgeon: Purcell Nails, MD;  Location: [facility] OR;  Service: Open Heart Surgery;  Laterality: N/A;  . TONSILLECTOMY      Current Medications: Current Meds  Medication Sig  . acetaminophen (TYLENOL) 500 MG tablet Take 1,000 mg by mouth every 6 (six) hours as needed.  [name] albuterol (PROVENTIL HFA;VENTOLIN HFA) 108 (90 Base) MCG/ACT inhaler Inhale 2 puffs into the lungs every 6 (six) hours as needed for wheezing or shortness of breath.  . ALPRAZolam (XANAX) 0.5 MG tablet Take 0.5 mg by mouth 2 (two) times daily.   [name] amoxicillin (AMOXIL) 500 MG capsule Take 2,000 mg (4 tablets) 45 minutes prior to dental procedures.  [name] aspirin EC 325 MG tablet Take 1 tablet (325 mg total) by mouth daily. (Patient taking differently: Take 162 mg by mouth daily. )  . buPROPion (WELLBUTRIN XL) 150 MG 24 hr tablet Take 150 mg by mouth daily.  . calcium carbonate (OS-CAL) 600 MG TABS tablet Take 600 mg by mouth daily.   . cyclobenzaprine (FLEXERIL) 10 MG tablet TK 1 T PO QD HS PRN MUSCLE TIGHTNESS  . fluticasone (FLONASE) 50 MCG/ACT nasal spray Place 1 spray into both nostrils daily as needed.   . Fluticasone-Salmeterol (ADVAIR) 250-50 MCG/DOSE AEPB Inhale 1 puff into the lungs 2 (two) times daily.  . mirtazapine (REMERON) 45 MG tablet Take 45 mg by mouth at bedtime.  . Multiple Vitamin (MULTIVITAMIN) capsule Take 1 capsule by mouth daily.  [name] omeprazole (PRILOSEC) 20 MG capsule Take 20 mg by mouth daily.     Allergies:   Codeine and Prednisone   Social History   Socioeconomic History  . Marital status: Married    Spouse name: Not on file  . Number of children: 1  . Years of education: BS  . Highest education level: Not on file  Occupational History  . Not on file  Social Needs  . Financial resource strain: Not on file  . Food insecurity:    Worry: Never true    Inability: Never true  . Transportation needs:    Medical: No    Non-medical: No   Tobacco Use  . Smoking status: Never Smoker  . Smokeless tobacco: Never Used  Substance and Sexual Activity  . Alcohol use: No    Alcohol/week: 0.0 standard drinks  . Drug use: No  . Sexual activity: Not on file  Lifestyle  . Physical activity:    Days per week: 0 days    Minutes per session: 0 min  . Stress: Rather much  Relationships  . Social connections:    Talks on phone: Not on file    Gets  together: Not on file    Attends religious service: Not on file    Active member of club or organization: Not on file    Attends meetings of clubs or organizations: Not on file    Relationship status: Not on file  Other Topics Concern  . Not on file  Social History Narrative   Occasionally consumes tea or coffee     Family History: The patient's family history includes Heart Problems in her maternal aunt; Leukemia in her mother; Sleep apnea in her father; Stroke in her father and mother; Valvular heart disease in her brother, maternal aunt, and paternal grandfather. ROS:   Please see the history of present illness.    All other systems reviewed and are negative.  EKGs/Labs/Other Studies Reviewed:    The following studies were reviewed today:  EKG:  EKG ordered today.  The ekg ordered today demonstrates sinus rhythm  Recent Labs:   Ref Range & Units 28mo ago 74mo ago  Sed Rate 0 - 40 mm/hr 43High   9 R   Echo 09/27/17 no pericardial effusion, normal AVR function EF 50-55% 07/22/2017: ALT 13 07/27/2017: Magnesium 2.2 08/09/2017: BUN 14; Creatinine, Ser 0.80; Hemoglobin 11.6; Platelets 356; Potassium 4.8; Sodium 143    Physical Exam:    VS:  BP 128/82 (BP Location: Right Arm, Patient Position: Sitting, Cuff Size: Normal)   Pulse 94   Ht 5\' 3"  (1.6 m)   Wt 176 lb (79.8 kg)   SpO2 97%   BMI 31.18 kg/m     Wt Readings from Last 3 Encounters:  12/23/17 176 lb (79.8 kg)  10/31/17 175 lb (79.4 kg)  10/19/17 177 lb 0.5 oz (80.3 kg)     GEN:  Well nourished, well developed  in no acute distress HEENT: Normal NECK: No JVD; No carotid bruits LYMPHATICS: No lymphadenopathy CARDIAC: No rub no murmur RRR, no murmurs, rubs, gallops RESPIRATORY:  Clear to auscultation without rales, wheezing or rhonchi  ABDOMEN: Soft, non-tender, non-distended MUSCULOSKELETAL:  No edema; No deformity  SKIN: Warm and dry NEUROLOGIC:  Alert and oriented x 3 PSYCHIATRIC:  Normal affect    Signed, Norman Herrlich, MD  12/23/2017 2:41 PM    [facility]

## 2017-12-23 NOTE — Patient Instructions (Signed)
Medication Instructions:  Your physician has recommended you make the following change in your medication:   START acebutolol (sectral) 200 mg: Take 1 tablet daily AFTER you hear back about the ZIO patch results  If you need a refill on your cardiac medications before your next appointment, please call your pharmacy.   Lab work: None  If you have labs (blood work) drawn today and your tests are completely normal, you will receive your results only by: [name] MyChart Message (if you have MyChart) OR . A paper copy in the mail If you have any lab test that is abnormal or we need to change your treatment, we will call you to review the results.  Testing/Procedures: You had an EKG today.   Your physician has recommended that you wear a ZIO patch monitor. ZIO patch monitors are medical devices that record the heart's electrical activity. Doctors most often use these monitors to diagnose arrhythmias. Arrhythmias are problems with the speed or rhythm of the heartbeat. The monitor is a small, portable device. You can wear one while you do your normal daily activities. This is usually used to diagnose what is causing palpitations/syncope (passing out). Wear for 7 days.   Follow-Up: At [facility] you and your health needs are our priority.  As part of our continuing mission to provide you with exceptional heart care, we have created designated Provider Care Teams.  These Care Teams include your primary Cardiologist (physician) and Advanced Practice Providers (APPs -  Physician Assistants and Nurse Practitioners) who all work together to provide you with the care you need, when you need it. You will need a follow up appointment in 6 weeks.        Acebutolol capsules What is this medicine? ACEBUTOLOL (a se BYOO toe lole) is a beta-blocker. Beta-blockers reduce the workload on the heart and help it to beat more regularly. This medicine is used to treat high blood pressure and to treat or prevent  certain heart rhythm problems. This medicine may be used for other purposes; ask your health care provider or pharmacist if you have questions. COMMON BRAND NAME(S): Sectral What should I tell my health care provider before I take this medicine? They need to know if you have any of these conditions: -diabetes -heart or vessel disease like slow heartrate, worsening heart failure, heart block, sick sinus syndrome or Raynaud's disease -kidney disease -liver disease -lung or breathing disease, like asthma or emphysema -pheochromocytoma -thyroid disease -an unusual or allergic reaction to acebutolol, other beta-blockers, medicines, foods, dyes, or preservatives -pregnant or trying to get pregnant -breast-feeding How should I use this medicine? Take this medicine by mouth with a glass of water. Follow the directions on the prescription label. You can take this medicine with or without food. Take your doses at regular intervals. Do not take your medicine more often than directed. Do not stop taking this medicine suddenly. This could lead to serious heart-related effects. Talk to your pediatrician regarding the use of this medicine in children. Special care may be needed. Overdosage: If you think you have taken too much of this medicine contact a poison control center or emergency room at once. NOTE: This medicine is only for you. Do not share this medicine with others. What if I miss a dose? If you miss a dose, take it as soon as you can. If it is almost time for your next dose, take only that dose. Do not take double or extra doses. What may interact with  this medicine? This medicine may interact with the following medications: -certain medicines for blood pressure, heart disease, irregular heart beat -NSAIDS, medicines for pain and inflammation, like ibuprofen or naproxen This list may not describe all possible interactions. Give your health care provider a list of all the medicines, herbs,  non-prescription drugs, or dietary supplements you use. Also tell them if you smoke, drink alcohol, or use illegal drugs. Some items may interact with your medicine. What should I watch for while using this medicine? Visit your doctor or health care professional for regular checks on your progress. Check your heart rate and blood pressure regularly while you are taking this medicine. Ask your doctor or health care professional what your heart rate and blood pressure should be, and when you should contact him or her. You may get drowsy or dizzy. Do not drive, use machinery, or do anything that needs mental alertness until you know how this drug affects you. Do not stand or sit up quickly, especially if you are an older patient. This reduces the risk of dizzy or fainting spells. Alcohol can make you more drowsy and dizzy. Avoid alcoholic drinks. This medicine can affect blood sugar levels. If you have diabetes, check with your doctor or health care professional before you change your diet or the dose of your diabetic medicine. Do not treat yourself for coughs, colds, or pain while you are taking this medicine without asking your doctor or health care professional for advice. Some ingredients may increase your blood pressure. What side effects may I notice from receiving this medicine? Side effects that you should report to your doctor or health care professional as soon as possible: -allergic reactions like skin rash, itching or hives, swelling of the face, lips, or tongue -breathing problems -chest pain -cold, tingling, or numb hands or feet -confusion -irregular heartbeat -muscle aches and pains -slow heart rate -sweating -swollen legs or ankles -tremor, shakes -vomiting Side effects that usually do not require medical attention (report to your doctor or health care professional if they continue or are bothersome): -anxiety -change in sex drive or performance -depression -diarrhea -dry or  burning eyes -headache -nausea This list may not describe all possible side effects. Call your doctor for medical advice about side effects. You may report side effects to FDA at 1-800-FDA-1088. Where should I keep my medicine? Keep out of the reach of children. Store at room temperature between 15 and 30 degrees C (59 and 86 degrees F). Protect from light. Keep container tightly closed. Throw away any unused medicine after the expiration date. NOTE: This sheet is a summary. It may not cover all possible information. If you have questions about this medicine, talk to your doctor, pharmacist, or health care provider.  2018 Elsevier/Gold Standard (2012-10-15 14:20:28)

## 2017-12-26 ENCOUNTER — Ambulatory Visit (HOSPITAL_COMMUNITY): Payer: BC Managed Care – PPO

## 2017-12-26 ENCOUNTER — Ambulatory Visit: Payer: BC Managed Care – PPO

## 2017-12-26 ENCOUNTER — Encounter (HOSPITAL_COMMUNITY): Payer: BC Managed Care – PPO

## 2017-12-26 DIAGNOSIS — R002 Palpitations: Secondary | ICD-10-CM | POA: Diagnosis not present

## 2017-12-26 DIAGNOSIS — I447 Left bundle-branch block, unspecified: Secondary | ICD-10-CM

## 2017-12-28 ENCOUNTER — Ambulatory Visit (HOSPITAL_COMMUNITY): Payer: BC Managed Care – PPO

## 2017-12-28 ENCOUNTER — Encounter (HOSPITAL_COMMUNITY): Payer: BC Managed Care – PPO

## 2017-12-30 ENCOUNTER — Ambulatory Visit (HOSPITAL_COMMUNITY): Payer: BC Managed Care – PPO

## 2017-12-30 ENCOUNTER — Encounter (HOSPITAL_COMMUNITY): Payer: BC Managed Care – PPO

## 2018-01-02 ENCOUNTER — Encounter (HOSPITAL_COMMUNITY): Payer: BC Managed Care – PPO

## 2018-01-02 ENCOUNTER — Ambulatory Visit (HOSPITAL_COMMUNITY): Payer: BC Managed Care – PPO

## 2018-01-04 ENCOUNTER — Ambulatory Visit (HOSPITAL_COMMUNITY): Payer: BC Managed Care – PPO

## 2018-01-04 ENCOUNTER — Encounter (HOSPITAL_COMMUNITY): Payer: BC Managed Care – PPO

## 2018-01-06 ENCOUNTER — Ambulatory Visit (HOSPITAL_COMMUNITY): Payer: BC Managed Care – PPO

## 2018-01-06 ENCOUNTER — Encounter (HOSPITAL_COMMUNITY): Payer: BC Managed Care – PPO

## 2018-01-09 ENCOUNTER — Encounter (HOSPITAL_COMMUNITY): Payer: BC Managed Care – PPO

## 2018-01-09 ENCOUNTER — Ambulatory Visit (HOSPITAL_COMMUNITY): Payer: BC Managed Care – PPO

## 2018-01-10 ENCOUNTER — Ambulatory Visit: Payer: BC Managed Care – PPO | Admitting: Cardiology

## 2018-01-11 ENCOUNTER — Ambulatory Visit (HOSPITAL_COMMUNITY): Payer: BC Managed Care – PPO

## 2018-01-11 ENCOUNTER — Encounter (HOSPITAL_COMMUNITY): Payer: BC Managed Care – PPO

## 2018-01-13 ENCOUNTER — Encounter (HOSPITAL_COMMUNITY): Payer: BC Managed Care – PPO

## 2018-01-13 ENCOUNTER — Ambulatory Visit (HOSPITAL_COMMUNITY): Payer: BC Managed Care – PPO

## 2018-01-30 ENCOUNTER — Telehealth: Payer: Self-pay | Admitting: Cardiology

## 2018-01-30 NOTE — Telephone Encounter (Signed)
Patient has concerns about chest pain that has worsened. Patient has been having upper respiratory issues for the past week in addition to her asthma. She reports that the chest pain happens when she is breathing and when she moves her arms.   Spoke with Dr. Dulce SellarMunley and he recommended patient follow up with her PCP due to upper respiratory issues. Patient verbalized understanding and states she will contact her PCP office now. No further questions.

## 2018-02-03 ENCOUNTER — Ambulatory Visit: Payer: BC Managed Care – PPO | Admitting: Cardiology

## 2018-02-03 ENCOUNTER — Encounter: Payer: Self-pay | Admitting: Cardiology

## 2018-02-03 VITALS — BP 130/80 | HR 74 | Ht 63.0 in | Wt 167.0 lb

## 2018-02-03 DIAGNOSIS — I10 Essential (primary) hypertension: Secondary | ICD-10-CM

## 2018-02-03 DIAGNOSIS — G4733 Obstructive sleep apnea (adult) (pediatric): Secondary | ICD-10-CM

## 2018-02-03 DIAGNOSIS — Z953 Presence of xenogenic heart valve: Secondary | ICD-10-CM

## 2018-02-03 DIAGNOSIS — I447 Left bundle-branch block, unspecified: Secondary | ICD-10-CM | POA: Diagnosis not present

## 2018-02-03 MED ORDER — NAPROXEN SODIUM 220 MG PO TABS: 220.0000 mg | ORAL_TABLET | Freq: Every day | ORAL | Status: DC | PRN

## 2018-02-03 NOTE — Patient Instructions (Signed)
Medication Instructions:  Your physician has recommended you make the following change in your medication:   START over the counter Aleve: Take 1 tablet daily for 2 weeks  If you need a refill on your cardiac medications before your next appointment, please call your pharmacy.   Lab work: None  If you have labs (blood work) drawn today and your tests are completely normal, you will receive your results only by: [name]. MyChart Message (if you have MyChart) OR . A paper copy in the mail If you have any lab test that is abnormal or we need to change your treatment, we will call you to review the results.  Testing/Procedures: None  Follow-Up: At [facility], you and your health needs are our priority.  As part of our continuing mission to provide you with exceptional heart care, we have created designated Provider Care Teams.  These Care Teams include your primary Cardiologist (physician) and Advanced Practice Providers (APPs -  Physician Assistants and Nurse Practitioners) who all work together to provide you with the care you need, when you need it. You will need a follow up appointment in 1 years.  Please call our office 2 months in advance to schedule this appointment.

## 2018-02-03 NOTE — Progress Notes (Signed)
Cardiology Office Note:    Date:  02/03/2018   ID:  ARDYS HATAWAY, DOB 1956/01/25, MRN [id number]  PCP:  Shirlean Mylar, MD  Cardiologist:  Norman Herrlich, MD    Referring MD: Shirlean Mylar, MD    ASSESSMENT:    1. 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 Essential hypertension   3. LBBB (left bundle branch block)   4. Obstructive sleep apnea    PLAN:    In order of problems listed above:  1. Stable course of plan is seen in the office in 1 year 2. Stable blood pressure target takes a minimum dose of selective beta-blocker continue the same 3. Stable pattern on office EKGs 4. Stable on CPAP 5. Sinus tachycardia is improved continue low-dose beta-blocker 6. Presently is having chest wall pain associated respiratory infection so she can take over-the-counter Aleve daily for 2 weeks if symptoms persist-referral for physical therapy modalities   Next appointment: 6 months although I usually see this case is at 1 year interval she continues to have cardiovascular symptoms   Medication Adjustments/Labs and Tests Ordered: Current medicines are reviewed at length with the patient today.  Concerns regarding medicines are outlined above.  No orders of the defined types were placed in this encounter.  No orders of the defined types were placed in this encounter.   Chief Complaint  Patient presents with  . Follow-up    after AVR    History of Present Illness:    Miranda Berger is a 62 y.o. female with a hx of LFLG paradoxical AS with tissue AVR hypertension sleep apnea sinus tachycardia  and LBBB last seen in the last 6 months. Compliance with diet, lifestyle and medications: Yes  She is under intense family stress at home and finds her self tearful and anxious.  Fortunately the low-dose selective beta-blocker blood pressure and heart rate are under control in the setting respiratory infection she is having chest wall pain.  No angina dyspnea  palpitation or syncope. Past Medical History:  Diagnosis Date  . Anemia    in the past   . Anxiety   . Asthma   . Depression   . Family history of adverse reaction to anesthesia    mom had nausea  . GERD (gastroesophageal reflux disease)   . Hepatitis    Hepatitis A in 3rd grade?  [name] Hypertension   . PONV (postoperative nausea and vomiting)   . 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 Sleep apnea    uses CPAP, 12, starts at 6    Past Surgical History:  Procedure Laterality Date  . ABDOMINAL HYSTERECTOMY    . AORTIC VALVE REPLACEMENT N/A 07/26/2017   Procedure: MINIMALLY INVASIVE AORTIC VALVE REPLACEMENT (AVR);  Surgeon: Purcell Nails, MD;  Location: [facility] OR;  Service: Open Heart Surgery;  Laterality: N/A;  . BLADDER SURGERY  2011  . CARDIAC CATHETERIZATION    . CARPAL TUNNEL RELEASE  2012  . CHOLECYSTECTOMY    . RIGHT/LEFT HEART CATH AND CORONARY ANGIOGRAPHY N/A 07/08/2017   Procedure: RIGHT/LEFT HEART CATH AND CORONARY ANGIOGRAPHY;  Surgeon: Tonny Bollman, MD;  [facility]VASIVE CV LAB;  Service: Cardiovascular;  Laterality: N/A;  . TEE WITHOUT CARDIOVERSION N/A 07/26/2017   Procedure: TRANSESOPHAGEAL ECHOCARDIOGRAM (TEE);  Surgeon: Purcell Nails, MD;  Location: [facility] OR;  Service: Open Heart Surgery;  Laterality: N/A;  . TONSILLECTOMY  Current Medications: Current Meds  Medication Sig  . acebutolol (SECTRAL) 200 MG capsule Take 1 capsule (200 mg total) by mouth daily.  [name]. acetaminophen (TYLENOL) 500 MG tablet Take 1,000 mg by mouth every 6 (six) hours as needed.  [name]. albuterol (PROVENTIL HFA;VENTOLIN HFA) 108 (90 Base) MCG/ACT inhaler Inhale 2 puffs into the lungs every 6 (six) hours as needed for wheezing or shortness of breath.  . ALPRAZolam (XANAX) 0.5 MG tablet Take 0.5 mg by mouth 2 (two) times daily.   [name]. amoxicillin (AMOXIL) 500 MG capsule Take 2,000 mg (4 tablets) 45 minutes prior to  dental procedures.  [name]. buPROPion (WELLBUTRIN XL) 150 MG 24 hr tablet Take 150 mg by mouth daily.  . calcium carbonate (OS-CAL) 600 MG TABS tablet Take 600 mg by mouth daily.   . cyclobenzaprine (FLEXERIL) 10 MG tablet TK 1 T PO QD HS PRN MUSCLE TIGHTNESS  . fluticasone (FLONASE) 50 MCG/ACT nasal spray Place 1 spray into both nostrils daily as needed.   . Fluticasone-Salmeterol (ADVAIR) 250-50 MCG/DOSE AEPB Inhale 1 puff into the lungs 2 (two) times daily.  . meloxicam (MOBIC) 15 MG tablet Take 15 mg by mouth daily.  . mirtazapine (REMERON) 45 MG tablet Take 45 mg by mouth at bedtime.  . Multiple Vitamin (MULTIVITAMIN) capsule Take 1 capsule by mouth daily.  [name]. omeprazole (PRILOSEC) 20 MG capsule Take 20 mg by mouth daily.     Allergies:   Codeine and Prednisone   Social History   Socioeconomic History  . Marital status: Married    Spouse name: Not on file  . Number of children: 1  . Years of education: BS  . Highest education level: Not on file  Occupational History  . Not on file  Social Needs  . Financial resource strain: Not on file  . Food insecurity:    Worry: Never true    Inability: Never true  . Transportation needs:    Medical: No    Non-medical: No  Tobacco Use  . Smoking status: Never Smoker  . Smokeless tobacco: Never Used  Substance and Sexual Activity  . Alcohol use: No    Alcohol/week: 0.0 standard drinks  . Drug use: No  . Sexual activity: Not on file  Lifestyle  . Physical activity:    Days per week: 0 days    Minutes per session: 0 min  . Stress: Rather much  Relationships  . Social connections:    Talks on phone: Not on file    Gets together: Not on file    Attends religious service: Not on file    Active member of club or organization: Not on file    Attends meetings of clubs or organizations: Not on file    Relationship status: Not on file  Other Topics Concern  . Not on file  Social History Narrative   Occasionally consumes tea or coffee       Family History: The patient's family history includes Heart Problems in her maternal aunt; Leukemia in her mother; Sleep apnea in her father; Stroke in her father and mother; Valvular heart disease in her brother, maternal aunt, and paternal grandfather. ROS:   Please see the history of present illness.    All other systems reviewed and are negative.  EKGs/Labs/Other Studies Reviewed:    The following studies were reviewed today  Recent Labs: 07/22/2017: ALT 13 07/27/2017: Magnesium 2.2 08/09/2017: BUN 14; Creatinine, Ser 0.80; Hemoglobin 11.6; Platelets 356; Potassium 4.8; Sodium 143  Recent Lipid  Panel No results found for: CHOL, TRIG, HDL, CHOLHDL, VLDL, LDLCALC, LDLDIRECT  Physical Exam:    VS:  BP 130/80 (BP Location: Right Arm, Patient Position: Sitting, Cuff Size: Normal)   Pulse 74   Ht 5\' 3"  (1.6 m)   Wt 167 lb (75.8 kg)   SpO2 96%   BMI 29.58 kg/m     Wt Readings from Last 3 Encounters:  02/03/18 167 lb (75.8 kg)  12/23/17 176 lb (79.8 kg)  10/31/17 175 lb (79.4 kg)     GEN:  Well nourished, well developed in no acute distress HEENT: Normal NECK: No JVD; No carotid bruits LYMPHATICS: No lymphadenopathy CARDIAC: RRR, no murmurs, rubs, gallops RESPIRATORY:  Clear to auscultation without rales, wheezing or rhonchi  ABDOMEN: Soft, non-tender, non-distended MUSCULOSKELETAL:  No edema; No deformity  SKIN: Warm and dry NEUROLOGIC:  Alert and oriented x 3 PSYCHIATRIC:  Normal affect    Signed, Norman Herrlich, MD  02/03/2018 8:47 AM    [facility]

## 2018-03-13 ENCOUNTER — Telehealth: Payer: Self-pay | Admitting: Cardiology

## 2018-03-13 NOTE — Telephone Encounter (Signed)
Yes re hot tub

## 2018-03-13 NOTE — Telephone Encounter (Signed)
Please advise. Thanks.  

## 2018-03-13 NOTE — Telephone Encounter (Signed)
Wants to know if she can get in the hot tub

## 2018-03-14 NOTE — Telephone Encounter (Signed)
Left message on patient's home phone per DPR advising that she can get in the hot tub per Dr. Dulce Sellar. Informed patient to contact our office with any further questions or concerns.

## 2018-03-31 ENCOUNTER — Telehealth: Payer: Self-pay | Admitting: Cardiology

## 2018-03-31 MED ORDER — CELECOXIB 100 MG PO CAPS: 100.0000 mg | ORAL_CAPSULE | Freq: Two times a day (BID) | ORAL | 0 refills | Status: DC

## 2018-03-31 NOTE — Telephone Encounter (Signed)
Patient called stating she is having her chest discomfort symptoms again. Please advise.

## 2018-03-31 NOTE — Telephone Encounter (Signed)
Spoke with patient who reports intermittent chest discomfort for the past week "above her breasts." Patient states the pain started off as sharp but now the area just seems very tender. Dr. Dulce Sellar recommends patient take celebrex 100 mg twice daily for 2 weeks to see if this will resolve pain. Patient agreeable and prescription sent to pharmacy. Advised patient to contact our office on Monday if symptoms have not improved.   Patient has anxiety and wants Dr. Dulce Sellar to know that she is now seeing a counselor in addition to taking anti-anxiety medications. Will make Dr. Dulce Sellar aware.

## 2018-04-03 NOTE — Telephone Encounter (Signed)
Pt called back today and is still having chest pain, (dull ache mostly) no shortness of breath but pain with deep breath. Please advise

## 2018-04-03 NOTE — Telephone Encounter (Signed)
Patient states her chest pain has worsened since starting celebrex on Friday. Patient reports worsening of chest pain when she takes deep breaths. Her BP is 126/85 and her HR is 81. Dr. Dulce Sellar recommends patient be seen in the office. An appointment has been scheduled for tomorrow, 04/04/2018, at 9:40 am in the [facility] office. Patient is agreeable and verbalized understanding.

## 2018-04-04 ENCOUNTER — Encounter: Payer: Self-pay | Admitting: Cardiology

## 2018-04-04 ENCOUNTER — Ambulatory Visit: Payer: BC Managed Care – PPO | Admitting: Cardiology

## 2018-04-04 VITALS — BP 130/82 | HR 85 | Ht 63.0 in | Wt 176.4 lb

## 2018-04-04 DIAGNOSIS — I10 Essential (primary) hypertension: Secondary | ICD-10-CM

## 2018-04-04 DIAGNOSIS — R071 Chest pain on breathing: Secondary | ICD-10-CM | POA: Diagnosis not present

## 2018-04-04 DIAGNOSIS — R0789 Other chest pain: Secondary | ICD-10-CM | POA: Insufficient documentation

## 2018-04-04 DIAGNOSIS — Z953 Presence of xenogenic heart valve: Secondary | ICD-10-CM

## 2018-04-04 DIAGNOSIS — I447 Left bundle-branch block, unspecified: Secondary | ICD-10-CM | POA: Diagnosis not present

## 2018-04-04 DIAGNOSIS — I308 Other forms of acute pericarditis: Secondary | ICD-10-CM

## 2018-04-04 HISTORY — DX: Other chest pain: R07.89

## 2018-04-04 NOTE — Patient Instructions (Signed)
Medication Instructions:  Your physician recommends that you continue on your current medications as directed. Please refer to the Current Medication list given to you today. If you need a refill on your cardiac medications before your next appointment, please call your pharmacy.   Lab work: Your physician recommends that you return for lab work today: CBC,CRP and sedimentation rate to be drawn.   If you have labs (blood work) drawn today and your tests are completely normal, you will receive your results only by: [name] MyChart Message (if you have MyChart) OR . A paper copy in the mail If you have any lab test that is abnormal or we need to change your treatment, we will call you to review the results.  Testing/Procedures: An EKG was performed today.  You have been referred to Physical Therapy for evaluation and treatment. You will be contacted to schedule this appointment.   Please contact Dr. Barry Dienes office to schedule an appointment to be seen, per Dr. Dulce Sellar.  Follow-Up: At [facility], you and your health needs are our priority.  As part of our continuing mission to provide you with exceptional heart care, we have created designated Provider Care Teams.  These Care Teams include your primary Cardiologist (physician) and Advanced Practice Providers (APPs -  Physician Assistants and Nurse Practitioners) who all work together to provide you with the care you need, when you need it. You will need a follow up appointment in 3 months.  Please call our office 2 months in advance to schedule this appointment.

## 2018-04-04 NOTE — Progress Notes (Signed)
Cardiology Office Note:    Date:  04/04/2018   ID:  Miranda Berger, DOB [birth year], MRN [id number]  PCP:  Maurice Small, MD  Cardiologist:  Shirlee More, MD    Referring MD: Maurice Small, MD    ASSESSMENT:    1. Costochondral chest pain   2. 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   3. LBBB (left bundle branch block)   4. Essential hypertension   5. Other acute pericarditis    PLAN:    In order of problems listed above:  1. She is having reproducible chest wall pain continue her nonsteroidal anti-inflammatory drug and will refer to physical therapy modalities and for assurance follow-up with CT surgery.  Note she had minimally invasive aortic valve replacement 2. Stable no evidence of valvular dysfunction 3. Stable pattern on EKG 4. Stable with beta-blocker 5. Mainly postoperatively she had pleuritic chest pain she had an elevated sed rate and CRP responded to nonsteroidal anti-inflammatory drug I do not think she is having pleuropericarditis but I will recheck those labs today along with CBC   Next appointment: 6 months   Medication Adjustments/Labs and Tests Ordered: Current medicines are reviewed at length with the patient today.  Concerns regarding medicines are outlined above.  Orders Placed This Encounter  Procedures  . C-reactive protein  . Sed Rate (ESR)  . CBC  . Ambulatory referral to Physical Therapy  . EKG 12-Lead   No orders of the defined types were placed in this encounter.   Chief Complaint  Patient presents with  . Chest Pain    History of Present Illness:    Miranda Berger is a 63 y.o. female with a hx of  LFLG paradoxical AS with tissue AVR 07/26/17 hypertension sleep apnea sinus tachycardia  and LBBB  last seen 02/03/18. She has had recurrent chest pain off and on since surgery. A FU CT chest was unremarkable 09/01/17. Preop left heart cath with normal coronary arteriography.  Miranda Berger.IMPRESSION: 1.  Negative for acute  pulmonary embolus. 2. Small layering left pleural effusion and trace pericardial effusion are new since the preoperative CTA on 07/20/2017. Interval aortic valve replacement and plating of the right 2nd rib costosternal junction. There is a healing right 2nd rib fracture along the lateral aspect of the plate. 3. Left lower lobe and anterior right lung atelectasis. 4. Mild reactive appearing mediastinal lymph nodes.   Compliance with diet, lifestyle and medications: Yes  She continues under intense family stress at home.  She is having chest pain which is positional localized along the body of the sternum costochondral junction bilaterally nonexertional and nature and not pleuropericardial.  She been unrelieved with a nonsteroidal anti-inflammatory drug I will refer for physical therapy modalities and for assurance and was seen by CT surgery.  Note she had normal coronary arteriography prior to valve replacement Past Medical History:  Diagnosis Date  . Anemia    in the past   . Anxiety   . Asthma   . Depression   . Family history of adverse reaction to anesthesia    mom had nausea  . GERD (gastroesophageal reflux disease)   . Hepatitis    Hepatitis A in 3rd grade?  Miranda Berger Hypertension   . PONV (postoperative nausea and vomiting)   . 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 07/26/2017   [employer], size 23  . Sleep apnea    uses CPAP, 12, starts at 6    Past  Surgical History:  Procedure Laterality Date  . ABDOMINAL HYSTERECTOMY    . AORTIC VALVE REPLACEMENT N/A 07/26/2017   Procedure: MINIMALLY INVASIVE AORTIC VALVE REPLACEMENT (AVR);  Surgeon: Rexene Alberts, MD;  Location: [facility];  Service: Open Heart Surgery;  Laterality: N/A;  . BLADDER SURGERY  2011  . CARDIAC CATHETERIZATION    . CARPAL TUNNEL RELEASE  2012  . CHOLECYSTECTOMY    . RIGHT/LEFT HEART CATH AND CORONARY ANGIOGRAPHY N/A 07/08/2017   Procedure: RIGHT/LEFT HEART CATH AND  CORONARY ANGIOGRAPHY;  Surgeon: Sherren Mocha, MD;  Location: [facility];  Service: Cardiovascular;  Laterality: N/A;  . TEE WITHOUT CARDIOVERSION N/A 07/26/2017   Procedure: TRANSESOPHAGEAL ECHOCARDIOGRAM (TEE);  Surgeon: Rexene Alberts, MD;  Location: [facility];  Service: Open Heart Surgery;  Laterality: N/A;  . TONSILLECTOMY      Current Medications: Current Meds  Medication Sig  . acebutolol (SECTRAL) 200 MG capsule Take 1 capsule (200 mg total) by mouth daily.  Miranda Berger acetaminophen (TYLENOL) 500 MG tablet Take 1,000 mg by mouth every 6 (six) hours as needed.  Miranda Berger albuterol (PROVENTIL HFA;VENTOLIN HFA) 108 (90 Base) MCG/ACT inhaler Inhale 2 puffs into the lungs every 6 (six) hours as needed for wheezing or shortness of breath.  . ALPRAZolam (XANAX) 0.5 MG tablet Take 0.5 mg by mouth 2 (two) times daily.   Miranda Berger amoxicillin (AMOXIL) 500 MG capsule Take 2,000 mg (4 tablets) 45 minutes prior to dental procedures.  Miranda Berger aspirin EC 81 MG tablet Take 81 mg by mouth daily.  Miranda Berger buPROPion (WELLBUTRIN XL) 150 MG 24 hr tablet Take 150 mg by mouth daily.  . calcium carbonate (OS-CAL) 600 MG TABS tablet Take 600 mg by mouth daily.   . celecoxib (CELEBREX) 100 MG capsule Take 1 capsule (100 mg total) by mouth 2 (two) times daily.  . cyclobenzaprine (FLEXERIL) 10 MG tablet TK 1 T PO QD HS PRN MUSCLE TIGHTNESS  . fluticasone (FLONASE) 50 MCG/ACT nasal spray Place 1 spray into both nostrils daily as needed.   . Fluticasone-Salmeterol (ADVAIR) 250-50 MCG/DOSE AEPB Inhale 1 puff into the lungs 2 (two) times daily.  . mirtazapine (REMERON) 45 MG tablet Take 45 mg by mouth at bedtime.  . Multiple Vitamin (MULTIVITAMIN) capsule Take 1 capsule by mouth daily.  . naproxen sodium (ALEVE) 220 MG tablet Take 1 tablet (220 mg total) by mouth daily as needed.  Miranda Berger omeprazole (PRILOSEC) 20 MG capsule Take 20 mg by mouth daily.     Allergies:   Codeine and Prednisone   Social History   Socioeconomic History  . Marital  status: Married    Spouse name: Not on file  . Number of children: 1  . Years of education: BS  . Highest education level: Not on file  Occupational History  . Not on file  Social Needs  . Financial resource strain: Not on file  . Food insecurity:    Worry: Never true    Inability: Never true  . Transportation needs:    Medical: No    Non-medical: No  Tobacco Use  . Smoking status: Never Smoker  . Smokeless tobacco: Never Used  Substance and Sexual Activity  . Alcohol use: No    Alcohol/week: 0.0 standard drinks  . Drug use: No  . Sexual activity: Not on file  Lifestyle  . Physical activity:    Days per week: 0 days    Minutes per session: 0 min  . Stress: Rather much  Relationships  .  Social connections:    Talks on phone: Not on file    Gets together: Not on file    Attends religious service: Not on file    Active member of club or organization: Not on file    Attends meetings of clubs or organizations: Not on file    Relationship status: Not on file  Other Topics Concern  . Not on file  Social History Narrative   Occasionally consumes tea or coffee     Family History: The patient's family history includes Heart Problems in her maternal aunt; Leukemia in her mother; Sleep apnea in her father; Stroke in her father and mother; Valvular heart disease in her brother, maternal aunt, and paternal grandfather. ROS:   Please see the history of present illness.    All other systems reviewed and are negative.  EKGs/Labs/Other Studies Reviewed:    The following studies were reviewed today:  EKG:  EKG ordered today.  The ekg ordered today demonstrates [facility] and is normal  Recent Labs: 07/22/2017: ALT 13 07/27/2017: Magnesium 2.2 08/09/2017: BUN 14; Creatinine, Ser 0.80; Hemoglobin 11.6; Platelets 356; Potassium 4.8; Sodium 143  Recent Lipid Panel No results found for: CHOL, TRIG, HDL, CHOLHDL, VLDL, LDLCALC, LDLDIRECT  Physical Exam:    VS:  BP 130/82 (BP Location:  Right Arm, Patient Position: Sitting, Cuff Size: Normal)   Pulse 85   Ht 5' 3"  (1.6 m)   Wt 176 lb 6.4 oz (80 kg)   SpO2 96%   BMI 31.25 kg/m     Wt Readings from Last 3 Encounters:  04/04/18 176 lb 6.4 oz (80 kg)  02/03/18 167 lb (75.8 kg)  12/23/17 176 lb (79.8 kg)     GEN:  Well nourished, well developed in no acute distress HEENT: Normal NECK: No JVD; No carotid bruits LYMPHATICS: No lymphadenopathy CARDIAC:  RRR, no murmurs, rubs, gallops  Tender body sternum and CCJ bilaterally RESPIRATORY:  Clear to auscultation without rales, wheezing or rhonchi  ABDOMEN: Soft, non-tender, non-distended MUSCULOSKELETAL:  No edema; No deformity  SKIN: Warm and dry NEUROLOGIC:  Alert and oriented x 3 PSYCHIATRIC:  Normal affect    Signed, Shirlee More, MD  04/04/2018 12:25 PM    [facility]

## 2018-04-05 LAB — CBC
Hematocrit: 35.5 % (ref 34.0–46.6)
Hemoglobin: 12 g/dL (ref 11.1–15.9)
MCH: 30 pg (ref 26.6–33.0)
MCHC: 33.8 g/dL (ref 31.5–35.7)
MCV: 89 fL (ref 79–97)
Platelets: 200 10*3/uL (ref 150–450)
RBC: 4 x10E6/uL (ref 3.77–5.28)
RDW: 13 % (ref 11.7–15.4)
WBC: 5.7 10*3/uL (ref 3.4–10.8)

## 2018-04-05 LAB — SEDIMENTATION RATE: Sed Rate: 6 mm/hr (ref 0–40)

## 2018-04-05 LAB — C-REACTIVE PROTEIN: CRP: 1 mg/L (ref 0–10)

## 2018-04-14 ENCOUNTER — Telehealth: Payer: Self-pay | Admitting: Cardiology

## 2018-04-14 NOTE — Telephone Encounter (Signed)
Patient still has not heard from cardiac rehab.

## 2018-04-17 ENCOUNTER — Telehealth: Payer: Self-pay

## 2018-04-17 ENCOUNTER — Other Ambulatory Visit: Payer: Self-pay

## 2018-04-17 ENCOUNTER — Telehealth: Payer: Self-pay | Admitting: *Deleted

## 2018-04-17 DIAGNOSIS — R0789 Other chest pain: Secondary | ICD-10-CM

## 2018-04-17 NOTE — Telephone Encounter (Signed)
Patient's husband, [name], per DPR informed to contact [facility] at [facility] at [phone] to schedule an appointment from the referral placed during last office visit on 04/04/2018 for costochondral chest pain. [name] verbalized understanding and will contact our office with any further questions or concerns.

## 2018-04-17 NOTE — Telephone Encounter (Signed)
Please advise if patient should continue taking celebrex 100 mg twice daily? She started taking this medication on Friday, 03/31/2018.

## 2018-04-17 NOTE — Telephone Encounter (Signed)
If improved OTC aleve one day if unimproved renew once

## 2018-04-17 NOTE — Telephone Encounter (Signed)
*  STAT* If patient is at the pharmacy, call can be transferred to refill team.   1. Which medications need to be refilled? (please list name of each medication and dose if known) Celecoxib 100 mg, bid  2. Which pharmacy/location (including street and city if local pharmacy) is medication to be sent to?[facility] on [address]  3. Do they need a 30 day or 90 day supply? 30

## 2018-04-17 NOTE — Telephone Encounter (Signed)
Mrs. Alphonse contacted the office requesting a follow-up appointment with Dr. Cornelius Moras at the request of Dr. Dulce Sellar.  She is s/p AVR in 07/2017 with Dr. Cornelius Moras who is to follow-up with her in June.  Patient stated that she is having ongoing chest pain to which she has seen Dr. Dulce Sellar several times for.  Patient has since been referred to PT for costochondral chest pain.  Patient stated that Dr. Dulce Sellar wanted Dr. Cornelius Moras to see patient for "reassurance".  Patient's appointment made for next available with chest xray.  Patient contacted and left a message regarding appointment time.  Will await a return call for confirmation.

## 2018-04-18 MED ORDER — CELECOXIB 100 MG PO CAPS: 100.0000 mg | ORAL_CAPSULE | Freq: Two times a day (BID) | ORAL | 0 refills | Status: DC

## 2018-04-18 NOTE — Telephone Encounter (Signed)
Patient's husband, [name], per DPR advised that the celebrex thus far has not improved patient's chest pain. Advised that celebrex would be refilled once only for 15 days and patient should continue taking until she has used all of this medication per Dr. Dulce Sellar. Prescription has been sent to [facility] in [place] as requested. [name] verbalized understanding. No further questions.

## 2018-04-18 NOTE — Addendum Note (Signed)
Addended by: Crist Fat on: 04/18/2018 08:30 AM   Modules accepted: Orders

## 2018-04-19 ENCOUNTER — Ambulatory Visit: Payer: BC Managed Care – PPO | Attending: Cardiology | Admitting: Physical Therapy

## 2018-04-19 ENCOUNTER — Encounter: Payer: Self-pay | Admitting: Physical Therapy

## 2018-04-19 ENCOUNTER — Other Ambulatory Visit: Payer: Self-pay

## 2018-04-19 DIAGNOSIS — M546 Pain in thoracic spine: Secondary | ICD-10-CM | POA: Diagnosis present

## 2018-04-19 DIAGNOSIS — M940 Chondrocostal junction syndrome [Tietze]: Secondary | ICD-10-CM | POA: Insufficient documentation

## 2018-04-19 DIAGNOSIS — R293 Abnormal posture: Secondary | ICD-10-CM | POA: Diagnosis present

## 2018-04-19 NOTE — Patient Instructions (Addendum)

## 2018-04-19 NOTE — Therapy (Signed)
[facility] [address] [place], [place], [address] Phone: [phone]   Fax:  [phone]  Physical Therapy Evaluation  Patient Details  Name: Miranda Berger MRN: [id number] Date of Birth: 1955-05-27 Referring Provider (PT): Baldo Daub, MD   Encounter Date: 04/19/2018  PT End of Session - 04/19/18 1056    Visit Number  1    Number of Visits  17    Date for PT Re-Evaluation  06/14/18    PT Start Time  0845    PT Stop Time  0932    PT Time Calculation (min)  47 min    Activity Tolerance  Patient tolerated treatment well    Behavior During Therapy  [facility] for tasks assessed/performed       Past Medical History:  Diagnosis Date  . Anemia    in the past   . Anxiety   . Asthma   . Depression   . Family history of adverse reaction to anesthesia    mom had nausea  . GERD (gastroesophageal reflux disease)   . Hepatitis    Hepatitis A in 3rd grade?  [name] Hypertension   . PONV (postoperative nausea and vomiting)   . 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 Sleep apnea    uses CPAP, 12, starts at 6    Past Surgical History:  Procedure Laterality Date  . ABDOMINAL HYSTERECTOMY    . AORTIC VALVE REPLACEMENT N/A 07/26/2017   Procedure: MINIMALLY INVASIVE AORTIC VALVE REPLACEMENT (AVR);  Surgeon: Purcell Nails, MD;  Location: [facility] OR;  Service: Open Heart Surgery;  Laterality: N/A;  . BLADDER SURGERY  2011  . CARDIAC CATHETERIZATION    . CARPAL TUNNEL RELEASE  2012  . CHOLECYSTECTOMY    . RIGHT/LEFT HEART CATH AND CORONARY ANGIOGRAPHY N/A 07/08/2017   Procedure: RIGHT/LEFT HEART CATH AND CORONARY ANGIOGRAPHY;  Surgeon: Tonny Bollman, MD;  Location: [facility] INVASIVE CV LAB;  Service: Cardiovascular;  Laterality: N/A;  . TEE WITHOUT CARDIOVERSION N/A 07/26/2017   Procedure: TRANSESOPHAGEAL ECHOCARDIOGRAM (TEE);  Surgeon: Purcell Nails, MD;  Location: [facility] OR;   Service: Open Heart Surgery;  Laterality: N/A;  . TONSILLECTOMY      There were no vitals filed for this visit.   Subjective Assessment - 04/19/18 0845    Subjective  pt is a 63 y.o F with CC of chest pain that started since she had aortic valve replacement on 07/26/2017. She reports symptoms have progressively worsened since onset. She reports having alot of stress at home which may contributes to her anxiety which she notes difficulty differntiating between chest pain and anxiety.     Pertinent History  Hx of open heart surgery (valve replacement)    Limitations  Lifting;House hold activities;Sitting    How long can you sit comfortably?  30 min     How long can you stand comfortably?  30 min    How long can you walk comfortably?   30 min    Diagnostic tests  Ct scan 7/11    Patient Stated Goals  to decrease pain, reduce limitation, to return to regular funciton    Currently in Pain?  Yes    Pain Score  7    at worst 10/01   Pain Location  Chest    Pain Orientation  Right    Pain Descriptors / Indicators  Tightness;Sharp;Aching;Sore   pulled   Pain Type  Chronic pain    Pain Onset  More than a month ago    Pain Frequency  Constant    Aggravating Factors   direct pressure, deep breathing, raising the arms.     Pain Relieving Factors  ibuprofen,          [facility] PT Assessment - 04/19/18 0845      Assessment   Medical Diagnosis  Costochondral chest pain pain    Referring Provider (PT)  Baldo Daub, MD    Onset Date/Surgical Date  --   since June 2019   Hand Dominance  Right    Prior Therapy  yes   cardiac     Precautions   Precautions  None    Precaution Comments  no lifting      Restrictions   Weight Bearing Restrictions  No      Balance Screen   Has the patient fallen in the past 6 months  No    Has the patient had a decrease in activity level because of a fear of falling?   No    Is the patient reluctant to leave their home because of a fear of falling?   No       Home Public house manager residence    Living Arrangements  Spouse/significant other    Available Help at Discharge  Family    Type of Home  House    Home Access  Stairs to enter    Entrance Stairs-Number of Steps  3    Entrance Stairs-Rails  Right   ascending   Home Layout  Two level    Alternate Level Stairs-Number of Steps  10    Alternate Level Stairs-Rails  Left   ascending   Home Equipment  Walker - 2 wheels      Prior Function   Level of Independence  Independent with basic ADLs    Vocation  Retired    Leisure  reading, sewing, Building surveyor   Overall Cognitive Status  Within Functional Limits for tasks assessed      Observation/Other Assessments   Focus on Therapeutic Outcomes (FOTO)   63% limited    51% limited     Posture/Postural Control   Posture/Postural Control  Postural limitations    Postural Limitations  Forward head;Rounded Shoulders      ROM / Strength   AROM / PROM / Strength  AROM;Strength      AROM   AROM Assessment Site  Shoulder;Thoracic    Right/Left Shoulder  Right;Left    Right Shoulder Flexion  111 Degrees    Right Shoulder ABduction  80 Degrees    Right Shoulder Horizontal ABduction  100 Degrees    Right Shoulder Horizontal  ADduction  24 Degrees    Left Shoulder Flexion  130 Degrees    Left Shoulder ABduction  92 Degrees    Left Shoulder Horizontal ABduction  94 Degrees    Left Shoulder Horizontal ADduction  14 Degrees      Strength   Strength Assessment Site  Shoulder;Hand    Right/Left Shoulder  Right;Left    Right Shoulder Flexion  4/5    Right Shoulder Extension  4+/5    Right Shoulder ABduction  4/5    Left Shoulder Flexion  4-/5   in available ROM   Left Shoulder Extension  4/5   in available ROM   Left Shoulder ABduction  4/5   in available ROM  Right Hand Grip (lbs)  35   41,33,31   Left Hand Grip (lbs)  22   19,21,26     Palpation   Palpation comment  TTP along the bil costal  cartilage of ribs 2-6 bil with L>R                Objective measurements completed on examination: See above findings.      [facility] Adult PT Treatment/Exercise - 04/19/18 0845      Exercises   Exercises  Shoulder;Lumbar      Lumbar Exercises: Seated   Other Seated Lumbar Exercises  seated anterior pelvic tilt 1 x 10 holding 3 seconds      Shoulder Exercises: Seated   Other Seated Exercises  seated thoracic rotation 1 x 10      Modalities   Modalities  Iontophoresis      Iontophoresis   Type of Iontophoresis  Dexamethasone    Location  L costal cartilage located around 4th rib    Dose  /ml    Time  6 hour patch             PT Education - 04/19/18 0856    Education Details  evaluation findings, POC, goals, HEP with proper form/ rationale    Person(s) Educated  Patient    Methods  Explanation;Verbal cues;Handout    Comprehension  Verbalized understanding;Verbal cues required       PT Short Term Goals - 04/19/18 1125      PT SHORT TERM GOAL #1   Title  pt to be i with inital HEP    Time  4    Period  Weeks    Status  New    Target Date  05/17/18      PT SHORT TERM GOAL #2   Title  pt to verbalize / demo proper posture and lifting mechanics to reduce and prevent chest / shoulder back     Time  4    Period  Weeks    Status  New    Target Date  05/17/18      PT SHORT TERM GOAL #3   Title  increase L grip strength by >/= 8 # to demo improvement in function     Time  4    Period  Weeks    Status  New    Target Date  05/17/18        PT Long Term Goals - 04/19/18 1126      PT LONG TERM GOAL #1   Title  promote shoulder L shoulder AROM grossly WFL compared bil with </= 2/10 pain for functional mobility required for ADLs    Time  8    Period  Weeks    Status  New    Target Date  06/14/18      PT LONG TERM GOAL #2   Title  increase bil shoulder strength to >/= 4+/5 in all planes to assist with functional lifting/ carrying activities with      Time  8    Period  Weeks    Status  New    Target Date  06/14/18      PT LONG TERM GOAL #3   Title  pt to be able to lift/ lower from overhead shelf >/= 8# and push/pull >/= 10# for functional              Plan - 04/19/18 1056    Clinical Impression Statement  pt presents to OPP[facility]  with dx of costochondral chest pain that started since she had cardiac surgery back in september of 2019. she is TTP along costochondral junctions as it attaches to the sternum bil with L>R. she demonstates limited L shoulder mobility and strength due to pain / guarding. she demonstrates limited thoracic mobility which plan to assess next visit, and she demonstrates rounded shoulder and slumped posture throughout session. she would benefit from physical therapy to decrease costochondral pain, promote shoulder and thoracic mobility, improve strength/ posture and maximize her function by addressing the deficits listed.     History and Personal Factors relevant to plan of care:  hx or cardiac surgery, hx of anxiety, depression, limited household activity/ mobliity due to pain     Clinical Presentation  Unstable    Clinical Presentation due to:  symptoms worsening since onset. chest pain, limited shoulder ROM, limited strength, abnormal posture    Clinical Decision Making  High    Rehab Potential  Good    PT Frequency  2x / week    PT Duration  8 weeks    PT Treatment/Interventions  ADLs/Self Care Home Management;Cryotherapy;Electrical Stimulation;Iontophoresis 4mg /ml Dexamethasone;Moist Heat;Ultrasound;Therapeutic exercise;Therapeutic activities;Patient/family education;Dry needling;Taping;Passive range of motion    PT Next Visit Plan  review/ update HEP, how was iontophoresis, AAROM for shoulder, assess thoracic ROM, posture eduation, pec STW and stretching, trial MHP, give PROM shoulder for HEP,    PT Home Exercise Plan  seated posture, anterior pelvic tilts, seated thoracic rotation, doorway stretch     Consulted and Agree with Plan of Care  Patient;Family member/caregiver    Family Member Consulted  husband       Patient will benefit from skilled therapeutic intervention in order to improve the following deficits and impairments:  Abnormal gait, Pain, Impaired UE functional use, Decreased strength, Increased fascial restricitons, Decreased endurance, Decreased activity tolerance, Postural dysfunction, Improper body mechanics  Visit Diagnosis: Pain in thoracic spine  Costochondritis  Abnormal posture     Problem List Patient Active Problem List   Diagnosis Date Noted  . Costochondral chest pain 04/04/2018  . Pericarditis 09/12/2017  . LBBB (left bundle branch block) 08/08/2017  . Obstructive sleep apnea   . Anxiety   . Asthma   . 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 07/26/2017  . Hypertension 06/21/2017  . Aortic stenosis 06/20/2017  . Degenerative tear of medial meniscus of right knee 04/21/2016   Lulu Riding PT, DPT, LAT, ATC  04/19/18  12:54 PM      [facility] [address] [place], [place], [address] Phone: [phone]   Fax:  [phone]  Name: Miranda Berger MRN: [id number] Date of Birth: 06/22/1955

## 2018-04-26 ENCOUNTER — Ambulatory Visit: Payer: BC Managed Care – PPO | Attending: Cardiology | Admitting: Physical Therapy

## 2018-04-26 DIAGNOSIS — R293 Abnormal posture: Secondary | ICD-10-CM | POA: Insufficient documentation

## 2018-04-26 DIAGNOSIS — M940 Chondrocostal junction syndrome [Tietze]: Secondary | ICD-10-CM

## 2018-04-26 DIAGNOSIS — M546 Pain in thoracic spine: Secondary | ICD-10-CM | POA: Diagnosis not present

## 2018-04-26 NOTE — Therapy (Signed)
[facility] [address] [place], [place], [address] Phone: [phone]   Fax:  [phone]  Physical Therapy Treatment  Patient Details  Name: Miranda Berger MRN: [id number] Date of Birth: Nov 08, 1955 Referring Provider (PT): Baldo Daub, MD   Encounter Date: 04/26/2018  PT End of Session - 04/26/18 0937    Visit Number  2    Number of Visits  17    Date for PT Re-Evaluation  06/14/18    PT Start Time  0930    PT Stop Time  1010    PT Time Calculation (min)  40 min       Past Medical History:  Diagnosis Date  . Anemia    in the past   . Anxiety   . Asthma   . Depression   . Family history of adverse reaction to anesthesia    mom had nausea  . GERD (gastroesophageal reflux disease)   . Hepatitis    Hepatitis A in 3rd grade?  [name] Hypertension   . PONV (postoperative nausea and vomiting)   . 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 Sleep apnea    uses CPAP, 12, starts at 6    Past Surgical History:  Procedure Laterality Date  . ABDOMINAL HYSTERECTOMY    . AORTIC VALVE REPLACEMENT N/A 07/26/2017   Procedure: MINIMALLY INVASIVE AORTIC VALVE REPLACEMENT (AVR);  Surgeon: Purcell Nails, MD;  Location: [facility] OR;  Service: Open Heart Surgery;  Laterality: N/A;  . BLADDER SURGERY  2011  . CARDIAC CATHETERIZATION    . CARPAL TUNNEL RELEASE  2012  . CHOLECYSTECTOMY    . RIGHT/LEFT HEART CATH AND CORONARY ANGIOGRAPHY N/A 07/08/2017   Procedure: RIGHT/LEFT HEART CATH AND CORONARY ANGIOGRAPHY;  Surgeon: Tonny Bollman, MD;  Location: [facility] INVASIVE CV LAB;  Service: Cardiovascular;  Laterality: N/A;  . TEE WITHOUT CARDIOVERSION N/A 07/26/2017   Procedure: TRANSESOPHAGEAL ECHOCARDIOGRAM (TEE);  Surgeon: Purcell Nails, MD;  Location: [facility] OR;  Service: Open Heart Surgery;  Laterality: N/A;  . TONSILLECTOMY      There were no vitals filed for this  visit.                    [facility] Adult PT Treatment/Exercise - 04/26/18 0001      Lumbar Exercises: Stretches   Lower Trunk Rotation  10 seconds    Lower Trunk Rotation Limitations  with opposite head turn      Shoulder Exercises: Supine   Diagonals Limitations  scap retract and chin tucks     Other Supine Exercises  cane pullovers       Shoulder Exercises: Seated   Other Seated Exercises  seated thoracic rotation 1 x 10, seated Chin tucks x 10 with verbal cues , scap retractions x 10       Shoulder Exercises: Stretch   Corner Stretch Limitations  doorway x 3                PT Short Term Goals - 04/19/18 1125      PT SHORT TERM GOAL #1   Title  pt to be i with inital HEP    Time  4    Period  Weeks    Status  New    Target Date  05/17/18      PT SHORT TERM GOAL #2   Title  pt to verbalize / demo proper posture  and lifting mechanics to reduce and prevent chest / shoulder back     Time  4    Period  Weeks    Status  New    Target Date  05/17/18      PT SHORT TERM GOAL #3   Title  increase L grip strength by >/= 8 # to demo improvement in function     Time  4    Period  Weeks    Status  New    Target Date  05/17/18        PT Long Term Goals - 04/19/18 1126      PT LONG TERM GOAL #1   Title  promote shoulder L shoulder AROM grossly WFL compared bil with </= 2/10 pain for functional mobility required for ADLs    Time  8    Period  Weeks    Status  New    Target Date  06/14/18      PT LONG TERM GOAL #2   Title  increase bil shoulder strength to >/= 4+/5 in all planes to assist with functional lifting/ carrying activities with     Time  8    Period  Weeks    Status  New    Target Date  06/14/18      PT LONG TERM GOAL #3   Title  pt to be able to lift/ lower from overhead shelf >/= 8# and push/pull >/= 10# for functional             Plan - 04/26/18 0935    Clinical Impression Statement  Pt reports she is having fatigue with  exercises. She reports stress at home is contributing. Reviewed HEP requiring mod cues. Advaned with shoulder supine pullovers as well as postural scap squeezes and chicn tucks. She could feel stretching in her chest with most exercises. No increased pain.     PT Next Visit Plan  review/ update HEP, how was iontophoresis, AAROM for shoulder, assess thoracic ROM, posture eduation, pec STW and stretching, trial MHP, give PROM shoulder for HEP,    PT Home Exercise Plan  seated posture, anterior pelvic tilts, seated thoracic rotation, doorway stretch, seated scap squeeze chin tuck, supine pullovers     Consulted and Agree with Plan of Care  Patient;Family member/caregiver       Patient will benefit from skilled therapeutic intervention in order to improve the following deficits and impairments:  Abnormal gait, Pain, Impaired UE functional use, Decreased strength, Increased fascial restricitons, Decreased endurance, Decreased activity tolerance, Postural dysfunction, Improper body mechanics  Visit Diagnosis: Pain in thoracic spine  Costochondritis  Abnormal posture     Problem List Patient Active Problem List   Diagnosis Date Noted  . Costochondral chest pain 04/04/2018  . Pericarditis 09/12/2017  . LBBB (left bundle branch block) 08/08/2017  . Obstructive sleep apnea   . Anxiety   . Asthma   . 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 07/26/2017  . Hypertension 06/21/2017  . Aortic stenosis 06/20/2017  . Degenerative tear of medial meniscus of right knee 04/21/2016    Sherrie Mustache, PTA 04/26/2018, 11:37 AM  [facility] [address] [place], [place], [address] Phone: [phone]   Fax:  [phone]  Name: Miranda Berger MRN: [id number] Date of Birth: 01-22-1956

## 2018-04-28 ENCOUNTER — Encounter: Payer: Self-pay | Admitting: Physical Therapy

## 2018-04-28 ENCOUNTER — Ambulatory Visit: Payer: BC Managed Care – PPO | Admitting: Physical Therapy

## 2018-04-28 DIAGNOSIS — R293 Abnormal posture: Secondary | ICD-10-CM

## 2018-04-28 DIAGNOSIS — M546 Pain in thoracic spine: Secondary | ICD-10-CM

## 2018-04-28 DIAGNOSIS — M940 Chondrocostal junction syndrome [Tietze]: Secondary | ICD-10-CM

## 2018-04-28 NOTE — Patient Instructions (Signed)
Access Code: [email or web address]  URL: [email or web address]  Date: 04/28/2018  Prepared by: Jannette Spanner   Exercises  Standing Bilateral Low Shoulder Row with Anchored Resistance - 10 reps - 2 sets - 5 hold - 7x weekly  Shoulder extension with resistance - Neutral - 10 reps - 1 sets - 5 hold - 1x daily - 7x weekly

## 2018-04-28 NOTE — Therapy (Addendum)
[place], [place], [address] Phone: [phone]   Fax:  [phone]  Physical Therapy Treatment / Discharge  Patient Details  Name: Miranda Berger MRN: [id number] Date of Birth: 1955-09-26 Referring Provider (PT): Richardo Priest, MD   Encounter Date: 04/28/2018  PT End of Session - 04/28/18 1034    Visit Number  3    Number of Visits  17    Date for PT Re-Evaluation  06/14/18    PT Start Time  1030   15 min late   PT Stop Time  1058    PT Time Calculation (min)  28 min       Past Medical History:  Diagnosis Date  . Anemia    in the past   . Anxiety   . Asthma   . Depression   . Family history of adverse reaction to anesthesia    mom had nausea  . GERD (gastroesophageal reflux disease)   . Hepatitis    Hepatitis A in 3rd grade?  [name] Hypertension   . PONV (postoperative nausea and vomiting)   . 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 07/26/2017   [employer], size 23  . Sleep apnea    uses CPAP, 12, starts at 6    Past Surgical History:  Procedure Laterality Date  . ABDOMINAL HYSTERECTOMY    . AORTIC VALVE REPLACEMENT N/A 07/26/2017   Procedure: MINIMALLY INVASIVE AORTIC VALVE REPLACEMENT (AVR);  Surgeon: Rexene Alberts, MD;  Location: [place];  Service: Open Heart Surgery;  Laterality: N/A;  . BLADDER SURGERY  2011  . CARDIAC CATHETERIZATION    . CARPAL TUNNEL RELEASE  2012  . CHOLECYSTECTOMY    . RIGHT/LEFT HEART CATH AND CORONARY ANGIOGRAPHY N/A 07/08/2017   Procedure: RIGHT/LEFT HEART CATH AND CORONARY ANGIOGRAPHY;  Surgeon: Sherren Mocha, MD;  Location: [place] CV LAB;  Service: Cardiovascular;  Laterality: N/A;  . TEE WITHOUT CARDIOVERSION N/A 07/26/2017   Procedure: TRANSESOPHAGEAL ECHOCARDIOGRAM (TEE);  Surgeon: Rexene Alberts, MD;  Location: [place];  Service: Open Heart Surgery;  Laterality: N/A;  . TONSILLECTOMY      There were no vitals  filed for this visit.  Subjective Assessment - 04/28/18 1033    Subjective  Pt reports pain is no longer constant. She has pain with deep breathing.     Currently in Pain?  Yes    Pain Score  3     Pain Location  Chest    Pain Orientation  Left    Pain Descriptors / Indicators  Tightness    Aggravating Factors   deep breath    Pain Relieving Factors  ibuprofen                       [facility] Adult PT Treatment/Exercise - 04/28/18 0001      Lumbar Exercises: Stretches   Lower Trunk Rotation  10 seconds    Lower Trunk Rotation Limitations  with opposite head turn      Shoulder Exercises: Supine   Diagonals Limitations  scap retract and chin tucks     Other Supine Exercises  seated thoracic extension -seat elevated with foam pad- cues for chin tuck    Other Supine Exercises  cane pullovers       Shoulder Exercises: Standing   Extension  10 reps    Theraband Level (Shoulder Extension)  Level 2 (Red)    Row  20  reps    Theraband Level (Shoulder Row)  Level 2 (Red)      Shoulder Exercises: Stretch   Corner Stretch Limitations  doorway x 6      Iontophoresis   Type of Iontophoresis  Dexamethasone    Location  L costal cartilage located around 4th rib    Dose  16m/ml    Time  6 hour patch             PT Education - 04/28/18 1052    Education Details  HEP    Person(s) Educated  Patient    Methods  Explanation;Handout    Comprehension  Verbalized understanding       PT Short Term Goals - 04/19/18 1125      PT SHORT TERM GOAL #1   Title  pt to be i with inital HEP    Time  4    Period  Weeks    Status  New    Target Date  05/17/18      PT SHORT TERM GOAL #2   Title  pt to verbalize / demo proper posture and lifting mechanics to reduce and prevent chest / shoulder back     Time  4    Period  Weeks    Status  New    Target Date  05/17/18      PT SHORT TERM GOAL #3   Title  increase L grip strength by >/= 8 # to demo improvement in function      Time  4    Period  Weeks    Status  New    Target Date  05/17/18        PT Long Term Goals - 04/19/18 1126      PT LONG TERM GOAL #1   Title  promote shoulder L shoulder AROM grossly WFL compared bil with </= 2/10 pain for functional mobility required for ADLs    Time  8    Period  Weeks    Status  New    Target Date  06/14/18      PT LONG TERM GOAL #2   Title  increase bil shoulder strength to >/= 4+/5 in all planes to assist with functional lifting/ carrying activities with     Time  8    Period  Weeks    Status  New    Target Date  06/14/18      PT LONG TERM GOAL #3   Title  pt to be able to lift/ lower from overhead shelf >/= 8# and push/pull >/= 10# for functional             Plan - 04/28/18 1100    Clinical Impression Statement  Pt arrives reporting lack of sleep lately. She ie trying to sleep hooklying and has bought a wedge. Continued streeors at home. Pregressed with scapular bands and updated HEP. Reviewed HEP to date. Increased soreness in chest on left so ionto patch applied today.     PT Next Visit Plan  review/ update HEP, how was iontophoresis, AAROM for shoulder, assess thoracic ROM, posture eduation, pec STW and stretching, trial MHP, give PROM shoulder for HEP,    PT Home Exercise Plan  seated posture, anterior pelvic tilts, seated thoracic rotation, doorway stretch, seated scap squeeze chin tuck, supine pullovers        Patient will benefit from skilled therapeutic intervention in order to improve the following deficits and impairments:  Abnormal gait, Pain, Impaired UE  functional use, Decreased strength, Increased fascial restricitons, Decreased endurance, Decreased activity tolerance, Postural dysfunction, Improper body mechanics  Visit Diagnosis: Pain in thoracic spine  Costochondritis  Abnormal posture     Problem List Patient Active Problem List   Diagnosis Date Noted  . Costochondral chest pain 04/04/2018  . Pericarditis 09/12/2017  .  LBBB (left bundle branch block) 08/08/2017  . Obstructive sleep apnea   . Anxiety   . Asthma   . 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 07/26/2017  . Hypertension 06/21/2017  . Aortic stenosis 06/20/2017  . Degenerative tear of medial meniscus of right knee 04/21/2016    Dorene Ar , PTA 04/28/2018, 11:07 AM  [place], [place], [address] Phone: [phone]   Fax:  [phone]  Name: Miranda Berger MRN: [id number] Date of Birth: [birth year]        PHYSICAL THERAPY DISCHARGE SUMMARY  Visits from Start of Care: 3  Current functional level related to goals / functional outcomes: See goals   Remaining deficits: unknown   Education / Equipment: HEP  Plan: Patient agrees to discharge.  Patient goals were not met. Patient is being discharged due to not returning since the last visit.  ?????         Kristoffer Leamon PT, DPT, LAT, ATC  10/18/18  1:21 PM

## 2018-05-01 ENCOUNTER — Other Ambulatory Visit: Payer: Self-pay

## 2018-05-01 ENCOUNTER — Ambulatory Visit
Admission: RE | Admit: 2018-05-01 | Discharge: 2018-05-01 | Disposition: A | Discharge status: Home / Self Care | Payer: BC Managed Care – PPO | Source: Ambulatory Visit | Attending: Thoracic Surgery (Cardiothoracic Vascular Surgery) | Admitting: Thoracic Surgery (Cardiothoracic Vascular Surgery)

## 2018-05-01 ENCOUNTER — Encounter: Payer: Self-pay | Admitting: Thoracic Surgery (Cardiothoracic Vascular Surgery)

## 2018-05-01 ENCOUNTER — Ambulatory Visit: Payer: BC Managed Care – PPO | Admitting: Thoracic Surgery (Cardiothoracic Vascular Surgery)

## 2018-05-01 VITALS — BP 135/83 | HR 85 | Resp 18 | Ht 63.0 in | Wt 172.2 lb

## 2018-05-01 DIAGNOSIS — R0789 Other chest pain: Secondary | ICD-10-CM

## 2018-05-01 DIAGNOSIS — Z953 Presence of xenogenic heart valve: Secondary | ICD-10-CM | POA: Diagnosis not present

## 2018-05-01 DIAGNOSIS — R071 Chest pain on breathing: Secondary | ICD-10-CM | POA: Diagnosis not present

## 2018-05-01 NOTE — Progress Notes (Signed)
[address]       Miranda Berger [address]             [phone]     CARDIOTHORACIC SURGERY OFFICE NOTE  Referring Provider is Baldo Daub, MD PCP is Shirlean Mylar, MD   HPI:  Patient is a 63 year old female with chronic anxiety who returns to the office today for follow-up related to atypical chest pain originally status post minimally invasive aortic valve replacement using a rapid deployment stented bovine pericardial tissue valve on July 26, 2017 for bicuspid aortic valve with severe symptomatic aortic stenosis.  She was last seen in our office on October 31, 2017 at which time she was doing well.  She had no pain in her chest at that time.  She states that approximately 2 months later she began to experience increased "tightness" across her chest.  Symptoms did not seem to be related to physical exertion.  Of note, she has been under considerable stress over the last several months related to issues with her family.  This is apparently continued to escalate, and the patient burst into tears here in our office today describing the circumstances.  She admits that she is extremely anxious and upset.  Under the circumstances her chest pain is continued to get worse.  She has been seen in follow-up by Dr. Dulce Sellar on several occasions and and she has recently been referred to physical therapy to help her with exercises to improve strength and use of her shoulders.  She was referred to our office for concerns related to her chest wall discomfort.  Most recent follow-up echocardiogram was performed September 27, 2017.  At that time the aortic valve prosthesis was functioning normally and left ventricular function was normal with ejection fraction estimated 50 to 55%.   Current Outpatient Medications  Medication Sig Dispense Refill  . acebutolol (SECTRAL) 200 MG capsule Take 1 capsule (200 mg total) by mouth daily. 30 capsule 3  . acetaminophen (TYLENOL) 500 MG tablet Take 1,000 mg  by mouth every 6 (six) hours as needed.    [name] albuterol (PROVENTIL HFA;VENTOLIN HFA) 108 (90 Base) MCG/ACT inhaler Inhale 2 puffs into the lungs every 6 (six) hours as needed for wheezing or shortness of breath.    . ALPRAZolam (XANAX) 0.5 MG tablet Take 0.5 mg by mouth 2 (two) times daily.     [name] amoxicillin (AMOXIL) 500 MG capsule Take 2,000 mg (4 tablets) 45 minutes prior to dental procedures. 12 capsule 2  . aspirin EC 81 MG tablet Take 81 mg by mouth daily.    [name] buPROPion (WELLBUTRIN XL) 150 MG 24 hr tablet Take 150 mg by mouth daily.    . calcium carbonate (OS-CAL) 600 MG TABS tablet Take 600 mg by mouth daily.     . cyclobenzaprine (FLEXERIL) 10 MG tablet TK 1 T PO QD HS PRN MUSCLE TIGHTNESS  1  . fluticasone (FLONASE) 50 MCG/ACT nasal spray Place 1 spray into both nostrils daily as needed.     . Fluticasone-Salmeterol (ADVAIR) 250-50 MCG/DOSE AEPB Inhale 1 puff into the lungs 2 (two) times daily.    . mirtazapine (REMERON) 45 MG tablet Take 45 mg by mouth at bedtime.    . Multiple Vitamin (MULTIVITAMIN) capsule Take 1 capsule by mouth daily.    . naproxen sodium (ALEVE) 220 MG tablet Take 1 tablet (220 mg total) by mouth daily as needed.    [name] omeprazole (PRILOSEC) 20 MG capsule Take  20 mg by mouth daily.     No current facility-administered medications for this visit.       Physical Exam:   BP 135/83 (BP Location: Right Arm, Patient Position: Sitting, Cuff Size: Normal)   Pulse 85   Resp 18   Ht 5\' 3"  (1.6 m)   Wt 172 lb 3.2 oz (78.1 kg)   SpO2 97% Comment: RA  BMI 30.50 kg/m   General:  Anxious female in acute emotional distress  Chest:   Clear to auscultation with symmetrical breath sounds  CV:   Regular rate and rhythm without murmur  Incisions:  Completely healed with no sensation of clicking or motion on palpation.  Palpation of the anterior sternum does elicit discomfort or "sensitivity"  Abdomen:  Soft nontender  Extremities:  Warm and well-perfused  Diagnostic  Tests:  CHEST - 2 VIEW  COMPARISON:  August 23, 2017  FINDINGS: The heart size and mediastinal contours are within normal limits. Both lungs are clear. The visualized skeletal structures are unremarkable.  IMPRESSION: No active cardiopulmonary disease.   Electronically Signed   By: Gerome Sam III M.D   On: 05/01/2018 14:00    Impression:  I have personally reviewed the patient's chest x-ray and the CT scan of the chest performed last July.  On both radiographic examinations the second costal cartilage appears intact and well approximated to the sternum.  There is no sign of sternal fracture.  There is no sign of fracture of the costal cartilage.  There is no surrounding fluid.  At this point it seems clear that the patient has some degree of residual discomfort and/or "sensitivity" of the anterior chest wall related to her surgery.  Is not clear whether or not she has costochondritis and it may be that her discomfort is more neuropathic in origin.  It is very clear that her symptoms are exacerbated by her underlying stress and anxiety.    Plan:  It might be reasonable to consider a trial of oral Neurontin to see if this alleviates some of the patient's anterior chest wall discomfort.  I have discussed this with the patient and she prefers to discuss any changes in her medications further with Dr. Dulce Sellar before making a decision.  I have reassured the patient that the patient's chest appears to have healed nicely and there is no sign of any instability of the costal cartilage or the costochondral junction with the sternum.  All of her questions have been addressed.  She will call and return to see [place] as needed.  I spent in excess of 15 minutes during the conduct of this office consultation and >50% of this time involved direct face-to-face encounter with the patient for counseling and/or coordination of their care.    Salvatore Decent. Cornelius Moras, MD 05/01/2018 3:05 PM

## 2018-05-01 NOTE — Patient Instructions (Signed)
Continue all previous medications without any changes at this time  

## 2018-05-03 ENCOUNTER — Other Ambulatory Visit: Payer: Self-pay

## 2018-05-03 ENCOUNTER — Encounter: Payer: BC Managed Care – PPO | Admitting: Physical Therapy

## 2018-05-03 MED ORDER — ACEBUTOLOL HCL 200 MG PO CAPS: 200.0000 mg | ORAL_CAPSULE | Freq: Every day | ORAL | 3 refills | Status: DC

## 2018-05-05 ENCOUNTER — Encounter: Payer: BC Managed Care – PPO | Admitting: Physical Therapy

## 2018-05-08 ENCOUNTER — Ambulatory Visit: Payer: BC Managed Care – PPO | Admitting: Physical Therapy

## 2018-05-10 ENCOUNTER — Encounter: Payer: BC Managed Care – PPO | Admitting: Physical Therapy

## 2018-05-17 ENCOUNTER — Encounter: Payer: BC Managed Care – PPO | Admitting: Physical Therapy

## 2018-05-19 ENCOUNTER — Encounter: Payer: BC Managed Care – PPO | Admitting: Physical Therapy

## 2018-05-24 ENCOUNTER — Encounter: Payer: BC Managed Care – PPO | Admitting: Physical Therapy

## 2018-05-26 ENCOUNTER — Encounter: Payer: BC Managed Care – PPO | Admitting: Physical Therapy

## 2018-06-30 ENCOUNTER — Telehealth: Payer: Self-pay | Admitting: Cardiology

## 2018-06-30 NOTE — Telephone Encounter (Signed)
Virtual Visit Pre-Appointment Phone Call  "(Name), I am calling you today to discuss your upcoming appointment. We are currently trying to limit exposure to the virus that causes COVID-19 by seeing patients at home rather than in the office."  1. "What is the BEST phone number to call the day of the visit?" - include this in appointment notes  2. Do you have or have access to (through a family member/friend) a smartphone with video capability that we can use for your visit?" a. If yes - list this number in appt notes as cell (if different from BEST phone #) and list the appointment type as a VIDEO visit in appointment notes b. If no - list the appointment type as a PHONE visit in appointment notes  3. Confirm consent - "In the setting of the current Covid19 crisis, you are scheduled for a (phone or video) visit with your provider on (date) at (time).  Just as we do with many in-office visits, in order for you to participate in this visit, we must obtain consent.  If you'd like, I can send this to your mychart (if signed up) or email for you to review.  Otherwise, I can obtain your verbal consent now.  All virtual visits are billed to your insurance company just like a normal visit would be.  By agreeing to a virtual visit, we'd like you to understand that the technology does not allow for your provider to perform an examination, and thus may limit your provider's ability to fully assess your condition. If your provider identifies any concerns that need to be evaluated in person, we will make arrangements to do so.  Finally, though the technology is pretty good, we cannot assure that it will always work on either your or our end, and in the setting of a video visit, we may have to convert it to a phone-only visit.  In either situation, we cannot ensure that we have a secure connection.  Are you willing to proceed?" STAFF: Did the patient verbally acknowledge consent to telehealth visit? Document  YES/NO here: Yes  4. Advise patient to be prepared - "Two hours prior to your appointment, go ahead and check your blood pressure, pulse, oxygen saturation, and your weight (if you have the equipment to check those) and write them all down. When your visit starts, your provider will ask you for this information. If you have an Apple Watch or Kardia device, please plan to have heart rate information ready on the day of your appointment. Please have a pen and paper handy nearby the day of the visit as well."  5. Give patient instructions for MyChart download to smartphone OR Doximity/Doxy.me as below if video visit (depending on what platform provider is using)  6. Inform patient they will receive a phone call 15 minutes prior to their appointment time (may be from unknown caller ID) so they should be prepared to answer    TELEPHONE CALL NOTE  Miranda Berger has been deemed a candidate for a follow-up tele-health visit to limit community exposure during the Covid-19 pandemic. I spoke with the patient via phone to ensure availability of phone/video source, confirm preferred email & phone number, and discuss instructions and expectations.  I reminded Miranda GamblesRita S Ekholm to be prepared with any vital sign and/or heart rhythm information that could potentially be obtained via home monitoring, at the time of her visit. I reminded Miranda GamblesRita S Salada to expect a phone call prior to  her visit.  Alvy Beal 06/30/2018 3:19 PM   INSTRUCTIONS FOR DOWNLOADING THE MYCHART APP TO SMARTPHONE  - The patient must first make sure to have activated MyChart and know their login information - If Apple, go to [employer] and type in MyChart in the search bar and download the app. If Android, ask patient to go to [employer] and type in [place] in the search bar and download the app. The app is free but as with any other app downloads, their phone may require them to verify saved payment information or Apple/Android password.  -  The patient will need to then log into the app with their MyChart username and password, and select [facility] as their healthcare provider to link the account. When it is time for your visit, go to the MyChart app, find appointments, and click Begin Video Visit. Be sure to Select Allow for your device to access the Microphone and Camera for your visit. You will then be connected, and your provider will be with you shortly.  **If they have any issues connecting, or need assistance please contact MyChart service desk [phone])**  **If using a computer, in order to ensure the best quality for their visit they will need to use either of the following Internet Browsers: [employer] or Google Chrome**  IF USING DOXIMITY or DOXY.ME - The patient will receive a link just prior to their visit by text.     FULL LENGTH CONSENT FOR TELE-HEALTH VISIT   I hereby voluntarily request, consent and authorize [facility] and its employed or contracted physicians, physician assistants, nurse practitioners or other licensed health care professionals (the Practitioner), to provide me with telemedicine health care services (the Services") as deemed necessary by the treating Practitioner. I acknowledge and consent to receive the Services by the Practitioner via telemedicine. I understand that the telemedicine visit will involve communicating with the Practitioner through live audiovisual communication technology and the disclosure of certain medical information by electronic transmission. I acknowledge that I have been given the opportunity to request an in-person assessment or other available alternative prior to the telemedicine visit and am voluntarily participating in the telemedicine visit.  I understand that I have the right to withhold or withdraw my consent to the use of telemedicine in the course of my care at any time, without affecting my right to future care or treatment, and that the  Practitioner or I may terminate the telemedicine visit at any time. I understand that I have the right to inspect all information obtained and/or recorded in the course of the telemedicine visit and may receive copies of available information for a reasonable fee.  I understand that some of the potential risks of receiving the Services via telemedicine include:   Delay or interruption in medical evaluation due to technological equipment failure or disruption;  Information transmitted may not be sufficient (e.g. poor resolution of images) to allow for appropriate medical decision making by the Practitioner; and/or   In rare instances, security protocols could fail, causing a breach of personal health information.  Furthermore, I acknowledge that it is my responsibility to provide information about my medical history, conditions and care that is complete and accurate to the best of my ability. I acknowledge that Practitioner's advice, recommendations, and/or decision may be based on factors not within their control, such as incomplete or inaccurate data provided by me or distortions of diagnostic images or specimens that may result from electronic transmissions. I  understand that the practice of medicine is not an exact science and that Practitioner makes no warranties or guarantees regarding treatment outcomes. I acknowledge that I will receive a copy of this consent concurrently upon execution via email to the email address I last provided but may also request a printed copy by calling the office of [facility].    I understand that my insurance will be billed for this visit.   I have read or had this consent read to me.  I understand the contents of this consent, which adequately explains the benefits and risks of the Services being provided via telemedicine.   I have been provided ample opportunity to ask questions regarding this consent and the Services and have had my questions answered to my  satisfaction.  I give my informed consent for the services to be provided through the use of telemedicine in my medical care  By participating in this telemedicine visit I agree to the above.

## 2018-07-10 ENCOUNTER — Emergency Department (HOSPITAL_COMMUNITY)
Admission: EM | Admit: 2018-07-10 | Discharge: 2018-07-11 | Disposition: A | Discharge status: Other Acute Inpatient Hospital | Payer: BC Managed Care – PPO | Source: Home / Self Care | Attending: Emergency Medicine | Admitting: Emergency Medicine

## 2018-07-10 ENCOUNTER — Encounter (HOSPITAL_COMMUNITY): Payer: Self-pay

## 2018-07-10 ENCOUNTER — Ambulatory Visit (HOSPITAL_COMMUNITY)
Admission: RE | Admit: 2018-07-10 | Discharge: 2018-07-10 | Disposition: A | Discharge status: Home / Self Care | Payer: BC Managed Care – PPO | Source: Home / Self Care | Attending: Psychiatry | Admitting: Psychiatry

## 2018-07-10 ENCOUNTER — Other Ambulatory Visit: Payer: Self-pay

## 2018-07-10 ENCOUNTER — Emergency Department (HOSPITAL_COMMUNITY): Payer: BC Managed Care – PPO

## 2018-07-10 ENCOUNTER — Telehealth: Payer: BC Managed Care – PPO | Admitting: Cardiology

## 2018-07-10 DIAGNOSIS — I10 Essential (primary) hypertension: Secondary | ICD-10-CM | POA: Insufficient documentation

## 2018-07-10 DIAGNOSIS — Z7982 Long term (current) use of aspirin: Secondary | ICD-10-CM | POA: Insufficient documentation

## 2018-07-10 DIAGNOSIS — F329 Major depressive disorder, single episode, unspecified: Secondary | ICD-10-CM | POA: Insufficient documentation

## 2018-07-10 DIAGNOSIS — F333 Major depressive disorder, recurrent, severe with psychotic symptoms: Secondary | ICD-10-CM | POA: Diagnosis not present

## 2018-07-10 DIAGNOSIS — J45909 Unspecified asthma, uncomplicated: Secondary | ICD-10-CM | POA: Insufficient documentation

## 2018-07-10 DIAGNOSIS — G4730 Sleep apnea, unspecified: Secondary | ICD-10-CM | POA: Insufficient documentation

## 2018-07-10 DIAGNOSIS — F23 Brief psychotic disorder: Secondary | ICD-10-CM | POA: Insufficient documentation

## 2018-07-10 DIAGNOSIS — Z885 Allergy status to narcotic agent status: Secondary | ICD-10-CM | POA: Insufficient documentation

## 2018-07-10 DIAGNOSIS — Z79899 Other long term (current) drug therapy: Secondary | ICD-10-CM | POA: Insufficient documentation

## 2018-07-10 DIAGNOSIS — F22 Delusional disorders: Secondary | ICD-10-CM | POA: Diagnosis not present

## 2018-07-10 DIAGNOSIS — Z8249 Family history of ischemic heart disease and other diseases of the circulatory system: Secondary | ICD-10-CM | POA: Insufficient documentation

## 2018-07-10 DIAGNOSIS — Z888 Allergy status to other drugs, medicaments and biological substances status: Secondary | ICD-10-CM | POA: Insufficient documentation

## 2018-07-10 DIAGNOSIS — F332 Major depressive disorder, recurrent severe without psychotic features: Secondary | ICD-10-CM | POA: Diagnosis present

## 2018-07-10 DIAGNOSIS — F419 Anxiety disorder, unspecified: Secondary | ICD-10-CM | POA: Insufficient documentation

## 2018-07-10 DIAGNOSIS — R440 Auditory hallucinations: Secondary | ICD-10-CM | POA: Insufficient documentation

## 2018-07-10 DIAGNOSIS — Z836 Family history of other diseases of the respiratory system: Secondary | ICD-10-CM | POA: Insufficient documentation

## 2018-07-10 DIAGNOSIS — Z1159 Encounter for screening for other viral diseases: Secondary | ICD-10-CM | POA: Insufficient documentation

## 2018-07-10 HISTORY — DX: Major depressive disorder, recurrent severe without psychotic features: F33.2

## 2018-07-10 LAB — COMPREHENSIVE METABOLIC PANEL
ALT: 19 U/L (ref 0–44)
AST: 17 U/L (ref 15–41)
Albumin: 4.7 g/dL (ref 3.5–5.0)
Alkaline Phosphatase: 53 U/L (ref 38–126)
Anion gap: 10 (ref 5–15)
BUN: 14 mg/dL (ref 8–23)
CO2: 27 mmol/L (ref 22–32)
Calcium: 9.8 mg/dL (ref 8.9–10.3)
Chloride: 105 mmol/L (ref 98–111)
Creatinine, Ser: 0.9 mg/dL (ref 0.44–1.00)
GFR calc Af Amer: >60 mL/min (ref >60)
GFR calc non Af Amer: >60 mL/min (ref >60)
Glucose, Bld: 118 mg/dL — ABNORMAL HIGH (ref 70–99)
Potassium: 3.8 mmol/L (ref 3.5–5.1)
Sodium: 142 mmol/L (ref 135–145)
Total Bilirubin: 0.6 mg/dL (ref 0.3–1.2)
Total Protein: 7.1 g/dL (ref 6.5–8.1)

## 2018-07-10 LAB — CBC
HCT: 39.4 % (ref 36.0–46.0)
Hemoglobin: 13.1 g/dL (ref 12.0–15.0)
MCH: 30 pg (ref 26.0–34.0)
MCHC: 33.2 g/dL (ref 30.0–36.0)
MCV: 90.4 fL (ref 80.0–100.0)
Platelets: 186 10*3/uL (ref 150–400)
RBC: 4.36 MIL/uL (ref 3.87–5.11)
RDW: 12 % (ref 11.5–15.5)
WBC: 5.9 10*3/uL (ref 4.0–10.5)
nRBC: 0 % (ref 0.0–0.2)

## 2018-07-10 LAB — RAPID URINE DRUG SCREEN, HOSP PERFORMED
Amphetamines: NOT DETECTED
Barbiturates: NOT DETECTED
Benzodiazepines: POSITIVE — AB
Cocaine: NOT DETECTED
Opiates: NOT DETECTED
Tetrahydrocannabinol: NOT DETECTED

## 2018-07-10 LAB — ACETAMINOPHEN LEVEL: Acetaminophen (Tylenol), Serum: <10 ug/mL — ABNORMAL LOW (ref 10–30)

## 2018-07-10 LAB — SALICYLATE LEVEL: Salicylate Lvl: <7 mg/dL (ref 2.8–30.0)

## 2018-07-10 LAB — ETHANOL: Alcohol, Ethyl (B): <10 mg/dL (ref <10)

## 2018-07-10 NOTE — H&P (Addendum)
Behavioral Health Medical Screening Exam  Miranda GamblesRita S Berger is an 63 y.o. female.  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  Patient presents today with disorganized thought, paranoia, depression, labile affect.  Patient reports that she has been to [employer] 5 times getting her phone checked out because she thought someone was following her.  She reports that she thinks her 63 year old son is going to come after her and she became tearful when talking about him coming back to the house.  She reports seeing things in the house that appeared to change.  She makes some report about some oxycodone that was old that she found and then makes a statement of never taking drugs when she was in high school.  Patient bounces from topic to topic.  Patient denies any significant mental health history other than some minor depression and some anxiety.  Patient also reports that she was attempting to contact her outpatient provider's office today but no one would answer back.  She stated that then she called 911 but patient could not be specific about why she was calling 911. Has been presented with the patient and he reports that this was fairly sudden onset of about a week to a week and a half ago.  He states that she does have some anxiety and depression but nothing has ever been this severe.  He reports that she has never been hospitalized.  He does report that she has a lot of medical issues and she is on a lot of medications.  Patient's husband states that he is concerned about if there is a interaction with her medications causing some issues but he could not detail any new medications that have been started recently.  Patient and patient's husband report that the patient takes Xanax, aspirin, mirtazapine, bupropion, amoxicillin since she had heart surgery in 2019, took amoxicillin before her dental visit, and acebutolol.  Due to the patient's sudden onset of symptoms with no significant history  of mental health there is concern for a medical complication.  [facility].  If patient is medically cleared, then request that patient be admitted to call behavioral health Hospital.  Total Time spent with patient: 30 minutes  Psychiatric Specialty Exam: Physical Exam  Nursing note and vitals reviewed. Constitutional: She is oriented to person, place, and time. She appears well-developed and well-nourished.  Cardiovascular: Normal rate.  Respiratory: Effort normal.  Musculoskeletal: Normal range of motion.  Neurological: She is alert and oriented to person, place, and time.  Skin: Skin is warm.    Review of Systems  Constitutional: Negative.   HENT: Negative.   Eyes: Negative.   Respiratory: Negative.   Cardiovascular: Negative.   Gastrointestinal: Negative.   Genitourinary: Negative.   Musculoskeletal: Negative.   Skin: Negative.   Neurological: Negative.   Endo/Heme/Allergies: Negative.   Psychiatric/Behavioral: Positive for depression. The patient is nervous/anxious.        Delusional thoughts, disorganized thoughts, paranoia    Blood pressure (!) 139/96, pulse 92, temperature 98.3 F (36.8 C), temperature source Oral, resp. rate 18, SpO2 99 %.There is no height or weight on file to calculate BMI.  General Appearance: Disheveled  Eye Contact:  Good  Speech:  Clear and Coherent and Slow  Volume:  Decreased  Mood:  Dysphoric  Affect:  Labile  Thought Process:  Disorganized and Descriptions of Associations: Loose  Orientation:  Full (Time, Place, and Person)  Thought Content:  Delusions and Paranoid Ideation  Suicidal Thoughts:  No  Homicidal Thoughts:  No  Memory:  Immediate;   Good Recent;   Fair Remote;   Fair  Judgement:  Impaired  Insight:  Lacking  Psychomotor Activity:  Decreased  Concentration: Concentration: Fair  Recall:  Good  Fund of Knowledge:Fair  Language: Fair  Akathisia:  No  Handed:  Right   AIMS (if indicated):     Assets:  Communication Skills Desire for Improvement Financial Resources/Insurance Housing Physical Health Resilience Social Support Transportation  Sleep:       Musculoskeletal: Strength & Muscle Tone: within normal limits Gait & Station: normal Patient leans: N/A  Blood pressure (!) 139/96, pulse 92, temperature 98.3 F (36.8 C), temperature source Oral, resp. rate 18, SpO2 99 %.  Recommendations:  Based on my evaluation the patient appears to have an emergency medical condition for which I recommend the patient be transferred to the emergency department for further evaluation.  [name], FNP 07/10/2018, 4:39 PM

## 2018-07-10 NOTE — Progress Notes (Signed)
Pt accepted to [facility]; room 300-2 Reola Calkins, NP is the accepting provider.   Dr. Jama Flavors is the attending provider.   Call report to [phone]   [facility] @ [facility] ED notified.    Pt is voluntary and can be transported by [facility].    Pt may be transported back to [facility] once [facility] ED provider documents pt is medically cleared.  Wells Guiles, LCSW, LCAS Disposition CSW [facility]/TTS [phone]
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
## 2018-07-10 NOTE — ED Notes (Signed)
Pt denies SI/HI/AVH at this time ° °

## 2018-07-10 NOTE — BH Assessment (Signed)
Assessment Note  Miranda Berger is a 63 y.o. female walk-in brought to [facility] to be evaluated due to anxiety and wanting to be alone.  Pt stated "I don't know what's wrong. Every marriage have problems but my son and his girlfriend moved in with [place] in Nov and they had a baby now things got worse.  I keep hearing voices in the house.  I just think everyone is against me.  I was holding the baby and I just saw everyone move around me.  I told them to take the baby because I envisioned myself falling over with the baby in my arms.  I'm not sure if the baby is really here or not."  Pt denies SA.  Pt states "I use to drink a glass of wine socially or on a cruise but stopped drinking when my son moved in Nov 2019."  Pt denies HI/V-hallucinations.   Pt resides with her husband, son and his girlfriend.  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  Pt denies having a history of physical, sexual, and verbal abuse.  Pt denies having a history of inpatient MH/SA treatment.  Pt reports that receives outpatient treatment from Deatra Robinson at [facility].  Pt reports that she also receive counseling with her minister Quillian Quince at [facility].  Patient was wearing layered winter clothes and appeared inappropriately groomed.  Pt was alert throughout the assessment.  Patient made good eye contact and had normal psychomotor activity.  Patient spoke in a soft voice with pressured speech.  Pt expressed feeling paranoid.  Pt's affect appeared dysphoric and congruent with stated mood. Pt's thought process was tangential.  Pt presented with partial insight and judgement.  Pt did not appear to be responding to internal stimuli.  Pt was not able to reliably contract for safety.  Pt is open sign herself involuntarily for inpatient treatment.   Disposition: [facility] discussed case with BH provider, Reola Calkins, NP who recommends the that pt is transferred to [facility] to be medically cleared due to sudden onset  psychosis and no prior MH history.  Diagnosis: F23 Brief Psychotic Disorder  Past Medical History:  Past Medical History:  Diagnosis Date  . Anemia    in the past   . Anxiety   . Asthma   . Depression   . Family history of adverse reaction to anesthesia    mom had nausea  . GERD (gastroesophageal reflux disease)   . Hepatitis    Hepatitis A in 3rd grade?  [name] Hypertension   . PONV (postoperative nausea and vomiting)   . 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 Sleep apnea    uses CPAP, 12, starts at 6    Past Surgical History:  Procedure Laterality Date  . ABDOMINAL HYSTERECTOMY    . AORTIC VALVE REPLACEMENT N/A 07/26/2017   Procedure: MINIMALLY INVASIVE AORTIC VALVE REPLACEMENT (AVR);  Surgeon: Purcell Nails, MD;  Location: [facility] OR;  Service: Open Heart Surgery;  Laterality: N/A;  . BLADDER SURGERY  2011  . CARDIAC CATHETERIZATION    . CARPAL TUNNEL RELEASE  2012  . CHOLECYSTECTOMY    . RIGHT/LEFT HEART CATH AND CORONARY ANGIOGRAPHY N/A 07/08/2017   Procedure: RIGHT/LEFT HEART CATH AND CORONARY ANGIOGRAPHY;  Surgeon: Tonny Bollman, MD;  Location: [facility] INVASIVE CV LAB;  Service: Cardiovascular;  Laterality: N/A;  . TEE WITHOUT CARDIOVERSION N/A 07/26/2017   Procedure:  TRANSESOPHAGEAL ECHOCARDIOGRAM (TEE);  Surgeon: Purcell Nailswen, Clarence H, MD;  [facility];  Service: Open Heart Surgery;  Laterality: N/A;  . TONSILLECTOMY      Family History:  Family History  Problem Relation Age of Onset  . Stroke Mother   . Leukemia Mother   . Stroke Father   . Sleep apnea Father   . Valvular heart disease Brother   . Heart Problems Maternal Aunt   . Valvular heart disease Paternal Grandfather   . Valvular heart disease Maternal Aunt     Social History:  reports that she has never smoked. She has never used smokeless tobacco. She reports that she does not drink alcohol or use drugs.  Additional Social History:   Alcohol / Drug Use Pain Medications: See MARs Prescriptions: See MARs Over the Counter: See MARs History of alcohol / drug use?: Yes Longest period of sobriety (when/how long): "Months, I only drink socially with a friends" Substance #1 Name of Substance 1: Alcohol 1 - Age of First Use: unknown 1 - Amount (size/oz): "1 or 2 glasses of wine" 1 - Frequency: "when on a cruise or out" 1 - Duration: ongoing 1 - Last Use / Amount: over 6 months ago  CIWA: CIWA-Ar BP: (!) 139/96 Pulse Rate: 92 COWS:    Allergies:  Allergies  Allergen Reactions  . Codeine Itching  . Prednisone Itching    Home Medications: (Not in a hospital admission)   OB/GYN Status:  No LMP recorded. Patient has had a hysterectomy.  General Assessment Data [facility] TTS Assessment: In system Is this a Tele or Face-to-Face Assessment?: Face-to-Face Is this an Initial Assessment or a Re-assessment for this encounter?: Initial Assessment Patient Accompanied by:: N/A Language Other than English: No Living Arrangements: Other (Comment) What gender do you identify as?: Female Marital status: Married(Since [id number]) Living Arrangements: Spouse/significant other, Children(Pt lives with her husband and her son, and his girlfriend) Can pt return to current living arrangement?: Yes Admission Status: Voluntary Is patient capable of signing voluntary admission?: Yes Referral Source: Self/Family/Friend  Medical Screening Exam [facility] Walk-in ONLY) Medical Exam completed: Yes  Crisis Care Plan Living Arrangements: Spouse/significant other, Children(Pt lives with her husband and her son, and his girlfriend) Armed forces operational officerLegal Guardian: Other:(Self) Name of Psychiatrist: Clydie BraunKaren Jones([place]) Name of Therapist: Designer, fashion/clothingteven Goode([employer])  Education Status Is patient currently in school?: No Is the patient employed, unemployed or receiving disability?: Unemployed(Retired)  Risk to  self with the past 6 months Suicidal Ideation: No(Pt denies SI) Has patient been a risk to self within the past 6 months prior to admission? : No Suicidal Intent: No Has patient had any suicidal intent within the past 6 months prior to admission? : No Is patient at risk for suicide?: No Suicidal Plan?: No Has patient had any suicidal plan within the past 6 months prior to admission? : No Access to Means: No Previous Attempts/Gestures: No Triggers for Past Attempts: None known Intentional Self Injurious Behavior: None Family Suicide History: Unknown Recent stressful life event(s): Other (Comment)(Son moved back in the home) Persecutory voices/beliefs?: No Depression: Yes Depression Symptoms: Insomnia, Isolating Substance abuse history and/or treatment for substance abuse?: No Suicide prevention information given to non-admitted patients: Not applicable  Risk to Others within the past 6 months Homicidal Ideation: No Does patient have any lifetime risk of violence toward others beyond the six months prior to admission? : No Thoughts of Harm to Others: No Current Homicidal Intent: No Current Homicidal Plan:  No Access to Homicidal Means: No History of harm to others?: No Assessment of Violence: None Noted Does patient have access to weapons?: No Criminal Charges Pending?: No Does patient have a court date: No Is patient on probation?: No  Psychosis Hallucinations: Auditory Delusions: Unspecified  Mental Status Report Appearance/Hygiene: Body odor, Layered clothes, Poor hygiene Eye Contact: Fair Motor Activity: Freedom of movement Speech: Incoherent, Pressured, Soft, Tangential Level of Consciousness: Alert, Quiet/awake Mood: Suspicious, Despair Affect: Anxious, Fearful, Preoccupied Thought Processes: Circumstantial, Tangential Judgement: Partial Orientation: Person, Place, Time, Situation Obsessive Compulsive Thoughts/Behaviors: Minimal  Cognitive  Functioning Concentration: Decreased Memory: Recent Impaired, Remote Impaired Is patient IDD: No Insight: Poor Impulse Control: Fair Appetite: Poor(Pt says "I try to eat a meal a day") Have you had any weight changes? : No Change Sleep: Decreased Total Hours of Sleep: 5 Vegetative Symptoms: None  ADLScreening [facility] Assessment Services) Patient's cognitive ability adequate to safely complete daily activities?: Yes Patient able to express need for assistance with ADLs?: Yes Independently performs ADLs?: Yes (appropriate for developmental age)  Prior Inpatient Therapy Prior Inpatient Therapy: No  Prior Outpatient Therapy Prior Outpatient Therapy: Yes Prior Therapy Dates: ongoing Prior Therapy Facilty/Provider(s): [facility] Robinson Reason for Treatment: anxiety and depression Does patient have an ACCT team?: No Does patient have Intensive In-House Services?  : No Does patient have [facility] services? : No Does patient have P4CC services?: No  ADL Screening (condition at time of admission) Patient's cognitive ability adequate to safely complete daily activities?: Yes Is the patient deaf or have difficulty hearing?: No Does the patient have difficulty seeing, even when wearing glasses/contacts?: No Does the patient have difficulty concentrating, remembering, or making decisions?: No Patient able to express need for assistance with ADLs?: Yes Does the patient have difficulty dressing or bathing?: No Independently performs ADLs?: Yes (appropriate for developmental age) Does the patient have difficulty walking or climbing stairs?: No Weakness of Legs: None Weakness of Arms/Hands: None  Home Assistive Devices/Equipment Home Assistive Devices/Equipment: None    Abuse/Neglect Assessment (Assessment to be complete while patient is alone) Abuse/Neglect Assessment Can Be Completed: Yes Physical Abuse: Denies Verbal Abuse: Denies Sexual Abuse: Denies Exploitation of patient/patient's  resources: Denies Self-Neglect: Denies Values / Beliefs Cultural Requests During Hospitalization: None Spiritual Requests During Hospitalization: None   Advance Directives (For Healthcare) Does Patient Have a Medical Advance Directive?: No Would patient like information on creating a medical advance directive?: No - Patient declined Nutrition Screen- [facility] Adult/WL/AP Patient's home diet: NPO        Disposition: [facility] discussed case with BH provider, Reola Calkins, NP who recommends the that pt is transferred to [facility] to be medically cleared due to sudden onset psychosis and no prior MH history.  Disposition Initial Assessment Completed for this Encounter: Yes Disposition of Patient: Transfer(to [facility] for medical clearance Per Reola Calkins, NP ) Type of outpatient treatment: Other Patient refused recommended treatment: ([facility] for medical clearance prior to inpt) Mode of transportation if patient is discharged/movement?: [place]  On Site Evaluation by:   Reviewed with Physician:    Tyron Russell, MS, [facility], NCC 07/10/2018 4:29 PM

## 2018-07-10 NOTE — Progress Notes (Deleted)
{Choose 1 Note Type (Telehealth Visit or Telephone Visit):[phone]}   Date:  07/10/2018   ID:  Miranda Berger, DOB 10/26/1955, MRN [id number]  Patient Location: Home Provider Location: Office  PCP:  Shirlean MylarWebb, Carol, MD  Cardiologist:  Norman HerrlichBrian Latoshia Monrroy, MD  Electrophysiologist:  None   Evaluation Performed:  Follow-Up Visit  Chief Complaint:  FU after SAVR  History of Present Illness:    Miranda Berger is a 63 y.o. 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 She has had recurrent chest pain off and on since surgery. A FU CT chest was unremarkable 09/01/17. Preop left heart cath with normal coronary arteriography. She was seen by Dr Cornelius Moraswen CTS recently with a normal postop exam.  The patient does not have symptoms concerning for COVID-19 infection (fever, chills, cough, or new shortness of breath).    Past Medical History:  Diagnosis Date  . Anemia    in the past   . Anxiety   . Asthma   . Depression   . Family history of adverse reaction to anesthesia    mom had nausea  . GERD (gastroesophageal reflux disease)   . Hepatitis    Hepatitis A in 3rd grade?  [name]. Hypertension   . PONV (postoperative nausea and vomiting)   . 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 Sleep apnea    uses CPAP, 12, starts at 6   Past Surgical History:  Procedure Laterality Date  . ABDOMINAL HYSTERECTOMY    . AORTIC VALVE REPLACEMENT N/A 07/26/2017   Procedure: MINIMALLY INVASIVE AORTIC VALVE REPLACEMENT (AVR);  Surgeon: Purcell Nailswen, Clarence H, MD;  [facility];  Service: Open Heart Surgery;  Laterality: N/A;  . BLADDER SURGERY  2011  . CARDIAC CATHETERIZATION    . CARPAL TUNNEL RELEASE  2012  . CHOLECYSTECTOMY    . RIGHT/LEFT HEART CATH AND CORONARY ANGIOGRAPHY N/A 07/08/2017   Procedure: RIGHT/LEFT HEART CATH AND CORONARY ANGIOGRAPHY;  Surgeon:  Tonny Bollmanooper, Michael, MD;  [facility];  Service: Cardiovascular;  Laterality: N/A;  . TEE WITHOUT CARDIOVERSION N/A 07/26/2017   Procedure: TRANSESOPHAGEAL ECHOCARDIOGRAM (TEE);  Surgeon: Purcell Nailswen, Clarence H, MD;  [facility];  Service: Open Heart Surgery;  Laterality: N/A;  . TONSILLECTOMY       No outpatient medications have been marked as taking for the 07/10/18 encounter (Appointment) with Baldo DaubMunley, Floree Zuniga J, MD.     Allergies:   Codeine and Prednisone   Social History   Tobacco Use  . Smoking status: Never Smoker  . Smokeless tobacco: Never Used  Substance Use Topics  . Alcohol use: No    Alcohol/week: 0.0 standard drinks  . Drug use: No     Family Hx: The patient's family history includes Heart Problems in her maternal aunt; Leukemia in her mother; Sleep apnea in her father; Stroke in her father and mother; Valvular heart disease in her brother, maternal aunt, and paternal grandfather.  ROS:   Please see the history of present illness.     All other systems reviewed and are negative.   Prior CV studies:   The following studies were reviewed today:  ***  Labs/Other Tests and Data Reviewed:    EKG:  An ECG dated 04/04/18 was personally reviewed today and demonstrated:  SRTH normal EKG  Recent Labs: 07/22/2017: ALT 13 07/27/2017:  Magnesium 2.2 08/09/2017: BUN 14; Creatinine, Ser 0.80; Potassium 4.8; Sodium 143 04/04/2018: Hemoglobin 12.0; Platelets 200   Recent Lipid Panel No results found for: CHOL, TRIG, HDL, CHOLHDL, LDLCALC, LDLDIRECT  Wt Readings from Last 3 Encounters:  05/01/18 172 lb 3.2 oz (78.1 kg)  04/04/18 176 lb 6.4 oz (80 kg)  02/03/18 167 lb (75.8 kg)     Objective:    Vital Signs:  There were no vitals taken for this visit.   {HeartCare Virtual Exam (Optional):[phone]::"VITAL SIGNS:  reviewed"}  ASSESSMENT & PLAN:    1. ***  COVID-19 Education: The signs and symptoms of COVID-19 were discussed with the patient and how to seek  care for testing (follow up with PCP or arrange E-visit).  ***The importance of social distancing was discussed today.  Time:   Today, I have spent *** minutes with the patient with telehealth technology discussing the above problems.     Medication Adjustments/Labs and Tests Ordered: Current medicines are reviewed at length with the patient today.  Concerns regarding medicines are outlined above.   Tests Ordered: No orders of the defined types were placed in this encounter.   Medication Changes: No orders of the defined types were placed in this encounter.   Disposition:  Follow up {follow up:15908}  Signed, Norman Herrlich, MD  07/10/2018 3:50 PM    [facility]

## 2018-07-10 NOTE — ED Triage Notes (Addendum)
Pt reports increased depression and stress at home, states she doesn't feel safe at her home anymore. Denies SI/HI but reports she is hearing voices of family members but states no one believes her. Pt tearful in triage. Pt was seen at [facility] but told to come here for medical clearance.

## 2018-07-11 ENCOUNTER — Telehealth: Payer: BC Managed Care – PPO | Admitting: Cardiology

## 2018-07-11 ENCOUNTER — Inpatient Hospital Stay (HOSPITAL_COMMUNITY)
Admission: AD | Admit: 2018-07-11 | Discharge: 2018-07-17 | DRG: 885 | Disposition: A | Discharge status: Home / Self Care | Payer: BC Managed Care – PPO | Source: Intra-hospital | Attending: Psychiatry | Admitting: Psychiatry

## 2018-07-11 ENCOUNTER — Encounter (HOSPITAL_COMMUNITY): Payer: Self-pay

## 2018-07-11 DIAGNOSIS — Z1159 Encounter for screening for other viral diseases: Secondary | ICD-10-CM | POA: Diagnosis not present

## 2018-07-11 DIAGNOSIS — R45851 Suicidal ideations: Secondary | ICD-10-CM | POA: Diagnosis present

## 2018-07-11 DIAGNOSIS — F323 Major depressive disorder, single episode, severe with psychotic features: Secondary | ICD-10-CM | POA: Diagnosis present

## 2018-07-11 DIAGNOSIS — G4700 Insomnia, unspecified: Secondary | ICD-10-CM | POA: Diagnosis present

## 2018-07-11 DIAGNOSIS — K219 Gastro-esophageal reflux disease without esophagitis: Secondary | ICD-10-CM | POA: Diagnosis present

## 2018-07-11 DIAGNOSIS — J440 Chronic obstructive pulmonary disease with acute lower respiratory infection: Secondary | ICD-10-CM | POA: Diagnosis present

## 2018-07-11 DIAGNOSIS — F333 Major depressive disorder, recurrent, severe with psychotic symptoms: Secondary | ICD-10-CM | POA: Diagnosis present

## 2018-07-11 DIAGNOSIS — F332 Major depressive disorder, recurrent severe without psychotic features: Secondary | ICD-10-CM | POA: Diagnosis not present

## 2018-07-11 DIAGNOSIS — Z952 Presence of prosthetic heart valve: Secondary | ICD-10-CM | POA: Diagnosis not present

## 2018-07-11 DIAGNOSIS — F22 Delusional disorders: Secondary | ICD-10-CM | POA: Diagnosis present

## 2018-07-11 DIAGNOSIS — F411 Generalized anxiety disorder: Secondary | ICD-10-CM | POA: Diagnosis present

## 2018-07-11 DIAGNOSIS — I10 Essential (primary) hypertension: Secondary | ICD-10-CM | POA: Diagnosis present

## 2018-07-11 DIAGNOSIS — M7918 Myalgia, other site: Secondary | ICD-10-CM | POA: Diagnosis present

## 2018-07-11 HISTORY — DX: Major depressive disorder, single episode, severe with psychotic features: F32.3

## 2018-07-11 LAB — URINALYSIS, COMPLETE (UACMP) WITH MICROSCOPIC
Bilirubin Urine: NEGATIVE
Glucose, UA: NEGATIVE mg/dL
Ketones, ur: NEGATIVE mg/dL
Nitrite: NEGATIVE
Protein, ur: NEGATIVE mg/dL
RBC / HPF: >50 RBC/hpf — ABNORMAL HIGH (ref 0–5)
Specific Gravity, Urine: 1.013 (ref 1.005–1.030)
pH: 6 (ref 5.0–8.0)

## 2018-07-11 LAB — FOLATE: Folate: 22.5 ng/mL (ref >5.9)

## 2018-07-11 LAB — SARS CORONAVIRUS 2 BY RT PCR (HOSPITAL ORDER, PERFORMED IN ~~LOC~~ HOSPITAL LAB): SARS Coronavirus 2: NEGATIVE

## 2018-07-11 LAB — TSH: TSH: 1.834 u[IU]/mL (ref 0.350–4.500)

## 2018-07-11 MED ORDER — ACEBUTOLOL HCL 200 MG PO CAPS
200.0000 mg | ORAL_CAPSULE | Freq: Every day | ORAL | Status: DC
  Administered 2018-07-11 – 2018-07-16 (×5): 200 mg via ORAL
  Filled 2018-07-11 (×10): qty 1

## 2018-07-11 MED ORDER — ASPIRIN EC 81 MG PO TBEC
81.0000 mg | DELAYED_RELEASE_TABLET | Freq: Every day | ORAL | Status: DC
  Administered 2018-07-11 – 2018-07-17 (×7): 81 mg via ORAL
  Filled 2018-07-11 (×10): qty 1

## 2018-07-11 MED ORDER — ACETAMINOPHEN 500 MG PO TABS
1000.0000 mg | ORAL_TABLET | Freq: Four times a day (QID) | ORAL | Status: DC | PRN
  Administered 2018-07-11 – 2018-07-12 (×3): 1000 mg via ORAL
  Filled 2018-07-11 (×3): qty 2

## 2018-07-11 MED ORDER — BUPROPION HCL ER (XL) 150 MG PO TB24: 150.0000 mg | ORAL_TABLET | Freq: Every day | ORAL | Status: DC

## 2018-07-11 MED ORDER — CYCLOBENZAPRINE HCL 10 MG PO TABS: 5.0000 mg | ORAL_TABLET | Freq: Two times a day (BID) | ORAL | Status: DC | PRN

## 2018-07-11 MED ORDER — ACEBUTOLOL HCL 200 MG PO CAPS: 200.0000 mg | ORAL_CAPSULE | Freq: Every day | ORAL | Status: DC

## 2018-07-11 MED ORDER — ALBUTEROL SULFATE HFA 108 (90 BASE) MCG/ACT IN AERS: 2.0000 | INHALATION_SPRAY | Freq: Four times a day (QID) | RESPIRATORY_TRACT | Status: DC | PRN

## 2018-07-11 MED ORDER — MOMETASONE FURO-FORMOTEROL FUM 200-5 MCG/ACT IN AERO: 2.0000 | INHALATION_SPRAY | Freq: Two times a day (BID) | RESPIRATORY_TRACT | Status: DC

## 2018-07-11 MED ORDER — BUPROPION HCL ER (XL) 150 MG PO TB24
150.0000 mg | ORAL_TABLET | Freq: Every day | ORAL | Status: DC
  Administered 2018-07-11 – 2018-07-12 (×2): 150 mg via ORAL
  Filled 2018-07-11 (×3): qty 1

## 2018-07-11 MED ORDER — PANTOPRAZOLE SODIUM 40 MG PO TBEC: 40.0000 mg | DELAYED_RELEASE_TABLET | Freq: Every day | ORAL | Status: DC

## 2018-07-11 MED ORDER — QUETIAPINE FUMARATE 50 MG PO TABS
50.0000 mg | ORAL_TABLET | Freq: Every day | ORAL | Status: DC
  Administered 2018-07-11 – 2018-07-12 (×2): 50 mg via ORAL
  Filled 2018-07-11 (×4): qty 1

## 2018-07-11 MED ORDER — ALPRAZOLAM 0.25 MG PO TABS
0.5000 mg | ORAL_TABLET | Freq: Two times a day (BID) | ORAL | Status: DC
  Administered 2018-07-11: 02:00:00 0.5 mg via ORAL
  Filled 2018-07-11: qty 2

## 2018-07-11 MED ORDER — MAGNESIUM HYDROXIDE 400 MG/5ML PO SUSP: 30.0000 mL | Freq: Every day | ORAL | Status: DC | PRN

## 2018-07-11 MED ORDER — ALUM & MAG HYDROXIDE-SIMETH 200-200-20 MG/5ML PO SUSP: 30.0000 mL | ORAL | Status: DC | PRN

## 2018-07-11 MED ORDER — MIRTAZAPINE 45 MG PO TABS
45.0000 mg | ORAL_TABLET | Freq: Every day | ORAL | Status: DC
  Administered 2018-07-11: 45 mg via ORAL
  Filled 2018-07-11: qty 3
  Filled 2018-07-11: qty 1

## 2018-07-11 MED ORDER — ALPRAZOLAM 0.5 MG PO TABS
0.5000 mg | ORAL_TABLET | Freq: Three times a day (TID) | ORAL | Status: DC | PRN
  Administered 2018-07-11 – 2018-07-12 (×5): 0.5 mg via ORAL
  Filled 2018-07-11 (×5): qty 1

## 2018-07-11 MED ORDER — PANTOPRAZOLE SODIUM 40 MG PO TBEC
40.0000 mg | DELAYED_RELEASE_TABLET | Freq: Every day | ORAL | Status: DC
  Administered 2018-07-11 – 2018-07-17 (×7): 40 mg via ORAL
  Filled 2018-07-11 (×10): qty 1

## 2018-07-11 MED ORDER — CALCIUM CARBONATE 1250 (500 CA) MG PO TABS
1250.0000 mg | ORAL_TABLET | Freq: Every day | ORAL | Status: DC
  Administered 2018-07-11 – 2018-07-17 (×6): 1250 mg via ORAL
  Filled 2018-07-11 (×11): qty 1

## 2018-07-11 MED ORDER — MIRTAZAPINE 45 MG PO TABS
45.0000 mg | ORAL_TABLET | Freq: Every day | ORAL | Status: DC
  Administered 2018-07-13 – 2018-07-16 (×4): 45 mg via ORAL
  Filled 2018-07-11 (×4): qty 1
  Filled 2018-07-11: qty 3
  Filled 2018-07-11 (×4): qty 1

## 2018-07-11 MED ORDER — ADULT MULTIVITAMIN W/MINERALS CH
1.0000 | ORAL_TABLET | Freq: Every day | ORAL | Status: DC
  Administered 2018-07-11 – 2018-07-17 (×7): 1 via ORAL
  Filled 2018-07-11 (×10): qty 1
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
## 2018-07-11 NOTE — H&P (Signed)
Psychiatric Admission Assessment Adult  Patient Identification: Miranda Berger MRN:  [id number] Date of Evaluation:  07/11/2018 Chief Complaint:  GAD Principal Diagnosis: MDD (major depressive disorder), recurrent severe, without psychosis (HCC) Diagnosis:  Principal Problem:   MDD (major depressive disorder), recurrent severe, without psychosis (HCC) Active Problems:   Major depressive disorder, single episode, severe, with psychosis (HCC)  History of Present Illness: Patient is seen and examined.  Patient is a 63 year old female with a longstanding past psychiatric history significant for major depression who presented to the behavioral health hospital as a walk-in patient on 07/10/2018.  She came with her husband.  She stated she did not know exactly what was wrong, but her son and his girlfriend had moved in with them in November, and they had a baby coming and thinks this got worse.  The patient stated that she heard voices in the house.  She thought it might be the girlfriend's voice from the room next door, but she looked in there and was not there.  She also felt paranoid that everyone was against her.  She stated she had been holding the baby, and salt everyone move around her.  She stated that she had been treated for depression for many years.  She been previously treated with Zoloft, but apparently that was not effective and she has been placed on mirtazapine and Wellbutrin for at least a year.  She stated that her sleep is been doing poorly as well.  She admitted to crying spells, having helplessness, hopelessness and worthlessness.  She was admitted to the hospital for evaluation and stabilization.  Associated Signs/Symptoms: Depression Symptoms:  depressed mood, anhedonia, insomnia, psychomotor agitation, fatigue, feelings of worthlessness/guilt, difficulty concentrating, hopelessness, suicidal thoughts without plan, anxiety, loss of energy/fatigue, disturbed sleep, (Hypo) Manic  Symptoms:  Delusions, Distractibility, Impulsivity, Irritable Mood, Labiality of Mood, Anxiety Symptoms:  Excessive Worry, Psychotic Symptoms:  Delusions, Hallucinations: Auditory PTSD Symptoms: Negative Total Time spent with patient: 45 minutes  Past Psychiatric History: Patient has a longstanding history of depression.  She has been treated as an outpatient for several years.  She has previously been treated with Zoloft, Wellbutrin, mirtazapine.  This is her first psychiatric admission.  Is the patient at risk to self? Yes.    Has the patient been a risk to self in the past 6 months? No.  Has the patient been a risk to self within the distant past? No.  Is the patient a risk to others? No.  Has the patient been a risk to others in the past 6 months? No.  Has the patient been a risk to others within the distant past? No.   Prior Inpatient Therapy:   Prior Outpatient Therapy:    Alcohol Screening: 1. How often do you have a drink containing alcohol?: Never 2. How many drinks containing alcohol do you have on a typical day when you are drinking?: 1 or 2 3. How often do you have six or more drinks on one occasion?: Never AUDIT-C Score: 0 4. How often during the last year have you found that you were not able to stop drinking once you had started?: Never 5. How often during the last year have you failed to do what was normally expected from you becasue of drinking?: Never 6. How often during the last year have you needed a first drink in the morning to get yourself going after a heavy drinking session?: Never 7. How often during the last year have you had a feeling  of guilt of remorse after drinking?: Never 8. How often during the last year have you been unable to remember what happened the night before because you had been drinking?: Never 9. Have you or someone else been injured as a result of your drinking?: No 10. Has a relative or friend or a doctor or another health worker been  concerned about your drinking or suggested you cut down?: No Alcohol Use Disorder Identification Test Final Score (AUDIT): 0 Alcohol Brief Interventions/Follow-up: Patient Refused, AUDIT Score <7 follow-up not indicated Substance Abuse History in the last 12 months:  No. Consequences of Substance Abuse: Negative Previous Psychotropic Medications: Yes  Psychological Evaluations: Yes  Past Medical History:  Past Medical History:  Diagnosis Date  . Anemia    in the past   . Anxiety   . Asthma   . Depression   . Family history of adverse reaction to anesthesia    mom had nausea  . GERD (gastroesophageal reflux disease)   . Hepatitis    Hepatitis A in 3rd grade?  [name] Hypertension   . PONV (postoperative nausea and vomiting)   . 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 Sleep apnea    uses CPAP, 12, starts at 6    Past Surgical History:  Procedure Laterality Date  . ABDOMINAL HYSTERECTOMY    . AORTIC VALVE REPLACEMENT N/A 07/26/2017   Procedure: MINIMALLY INVASIVE AORTIC VALVE REPLACEMENT (AVR);  Surgeon: Purcell Nails, MD;  Location: [facility] OR;  Service: Open Heart Surgery;  Laterality: N/A;  . BLADDER SURGERY  2011  . CARDIAC CATHETERIZATION    . CARPAL TUNNEL RELEASE  2012  . CHOLECYSTECTOMY    . RIGHT/LEFT HEART CATH AND CORONARY ANGIOGRAPHY N/A 07/08/2017   Procedure: RIGHT/LEFT HEART CATH AND CORONARY ANGIOGRAPHY;  Surgeon: Tonny Bollman, MD;  Location: [facility] INVASIVE CV LAB;  Service: Cardiovascular;  Laterality: N/A;  . TEE WITHOUT CARDIOVERSION N/A 07/26/2017   Procedure: TRANSESOPHAGEAL ECHOCARDIOGRAM (TEE);  Surgeon: Purcell Nails, MD;  Location: [facility] OR;  Service: Open Heart Surgery;  Laterality: N/A;  . TONSILLECTOMY     Family History:  Family History  Problem Relation Age of Onset  . Stroke Mother   . Leukemia Mother   . Stroke Father   . Sleep apnea Father   . Valvular heart disease Brother    . Heart Problems Maternal Aunt   . Valvular heart disease Paternal Grandfather   . Valvular heart disease Maternal Aunt    Family Psychiatric  History: She had an uncle with similar problems. Tobacco Screening: Have you used any form of tobacco in the last 30 days? (Cigarettes, Smokeless Tobacco, Cigars, and/or Pipes): No Social History:  Social History   Substance and Sexual Activity  Alcohol Use No  . Alcohol/week: 0.0 standard drinks     Social History   Substance and Sexual Activity  Drug Use No    Additional Social History:      Pain Medications: See MARs Prescriptions: See MARs Over the Counter: See MARs History of alcohol / drug use?: Yes Longest period of sobriety (when/how long): "Months, I only drink socially with a friends" Negative Consequences of Use: (none) Withdrawal Symptoms: Other (Comment)(none) Name of Substance 1: Alcohol 1 - Age of First Use: unknown 1 - Amount (size/oz): "1 or 2 glasses of wine" 1 - Frequency: "when on a cruise or out" 1 - Duration: ongoing 1 - Last Use / Amount:  over 6 months ago                  Allergies:   Allergies  Allergen Reactions  . Codeine Itching  . Prednisone Itching   Lab Results:  Results for orders placed or performed during the hospital encounter of 07/10/18 (from the past 48 hour(s))  Rapid urine drug screen (hospital performed)     Status: Abnormal   Collection Time: 07/10/18  5:31 PM  Result Value Ref Range   Opiates NONE DETECTED NONE DETECTED   Cocaine NONE DETECTED NONE DETECTED   Benzodiazepines POSITIVE (A) NONE DETECTED   Amphetamines NONE DETECTED NONE DETECTED   Tetrahydrocannabinol NONE DETECTED NONE DETECTED   Barbiturates NONE DETECTED NONE DETECTED    Comment: (NOTE) DRUG SCREEN FOR MEDICAL PURPOSES ONLY.  IF CONFIRMATION IS NEEDED FOR ANY PURPOSE, NOTIFY LAB WITHIN 5 DAYS. LOWEST DETECTABLE LIMITS FOR URINE DRUG SCREEN Drug Class                     Cutoff (ng/mL) Amphetamine  and metabolites    1000 Barbiturate and metabolites    200 Benzodiazepine                 200 Tricyclics and metabolites     300 Opiates and metabolites        300 Cocaine and metabolites        300 THC                            50 Performed at [facility] Lab, [address] [place], [place] [address]   Comprehensive metabolic panel     Status: Abnormal   Collection Time: 07/10/18  5:33 PM  Result Value Ref Range   Sodium 142 135 - 145 mmol/L   Potassium 3.8 3.5 - 5.1 mmol/L   Chloride 105 98 - 111 mmol/L   CO2 27 22 - 32 mmol/L   Glucose, Bld 118 (H) 70 - 99 mg/dL   BUN 14 8 - 23 mg/dL   Creatinine, Ser 6.04 0.44 - 1.00 mg/dL   Calcium 9.8 8.9 - 54.0 mg/dL   Total Protein 7.1 6.5 - 8.1 g/dL   Albumin 4.7 3.5 - 5.0 g/dL   AST 17 15 - 41 U/L   ALT 19 0 - 44 U/L   Alkaline Phosphatase 53 38 - 126 U/L   Total Bilirubin 0.6 0.3 - 1.2 mg/dL   GFR calc non Af Amer >60 >60 mL/min   GFR calc Af Amer >60 >60 mL/min   Anion gap 10 5 - 15    Comment: Performed at [facility] Lab, [address], [place], [place] [address]  Ethanol     Status: None   Collection Time: 07/10/18  5:33 PM  Result Value Ref Range   Alcohol, Ethyl (B) <10 <10 mg/dL    Comment: (NOTE) Lowest detectable limit for serum alcohol is 10 mg/dL. For medical purposes only. Performed at [facility] Lab, [address] [place], [place] [address]   Salicylate level     Status: None   Collection Time: 07/10/18  5:33 PM  Result Value Ref Range   Salicylate Lvl <7.0 2.8 - 30.0 mg/dL    Comment: Performed at [facility] Lab, [address], [place], [place] [address]  Acetaminophen level     Status: Abnormal   Collection Time: 07/10/18  5:33 PM  Result  Value Ref Range   Acetaminophen (Tylenol), Serum <10 (L) 10 - 30 ug/mL    Comment: (NOTE) Therapeutic concentrations vary significantly. A range of 10-30 ug/mL  may be an effective concentration for many patients. However, some  are best treated at  concentrations outside of this range. Acetaminophen concentrations >150 ug/mL at 4 hours after ingestion  and >50 ug/mL at 12 hours after ingestion are often associated with  toxic reactions. Performed at [facility] Lab, [address] [place], [place] [address]   cbc     Status: None   Collection Time: 07/10/18  5:33 PM  Result Value Ref Range   WBC 5.9 4.0 - 10.5 K/uL   RBC 4.36 3.87 - 5.11 MIL/uL   Hemoglobin 13.1 12.0 - 15.0 g/dL   HCT 60.4 54.0 - 98.1 %   MCV 90.4 80.0 - 100.0 fL   MCH 30.0 26.0 - 34.0 pg   MCHC 33.2 30.0 - 36.0 g/dL   RDW 19.1 47.8 - 29.5 %   Platelets 186 150 - 400 K/uL   nRBC 0.0 0.0 - 0.2 %    Comment: Performed at [facility] Lab, [address] [place], [place] [address]  SARS Coronavirus 2 (CEPHEID - Performed in [facility] hospital lab), Hosp Order     Status: None   Collection Time: 07/10/18 11:50 PM  Result Value Ref Range   SARS Coronavirus 2 NEGATIVE NEGATIVE    Comment: (NOTE) If result is NEGATIVE SARS-CoV-2 target nucleic acids are NOT DETECTED. The SARS-CoV-2 RNA is generally detectable in upper and lower  respiratory specimens during the acute phase of infection. The lowest  concentration of SARS-CoV-2 viral copies this assay can detect is 250  copies / mL. A negative result does not preclude SARS-CoV-2 infection  and should not be used as the sole basis for treatment or other  patient management decisions.  A negative result may occur with  improper specimen collection / handling, submission of specimen other  than nasopharyngeal swab, presence of viral mutation(s) within the  areas targeted by this assay, and inadequate number of viral copies  (<250 copies / mL). A negative result must be combined with clinical  observations, patient history, and epidemiological information. If result is POSITIVE SARS-CoV-2 target nucleic acids are DETECTED. The SARS-CoV-2 RNA is generally detectable in upper and lower  respiratory specimens  dur ing the acute phase of infection.  Positive  results are indicative of active infection with SARS-CoV-2.  Clinical  correlation with patient history and other diagnostic information is  necessary to determine patient infection status.  Positive results do  not rule out bacterial infection or co-infection with other viruses. If result is PRESUMPTIVE POSTIVE SARS-CoV-2 nucleic acids MAY BE PRESENT.   A presumptive positive result was obtained on the submitted specimen  and confirmed on repeat testing.  While 2019 novel coronavirus  (SARS-CoV-2) nucleic acids may be present in the submitted sample  additional confirmatory testing may be necessary for epidemiological  and / or clinical management purposes  to differentiate between  SARS-CoV-2 and other Sarbecovirus currently known to infect humans.  If clinically indicated additional testing with an alternate test  methodology [phone]) is advised. The SARS-CoV-2 RNA is generally  detectable in upper and lower respiratory sp ecimens during the acute  phase of infection. The expected result is Negative. Fact Sheet for Patients:  BoilerBrush.com.cy Fact Sheet for Healthcare Providers: https://pope.com/ This test is not yet approved or cleared by the [place]  FDA and has been authorized for detection and/or diagnosis of SARS-CoV-2 by FDA under an Emergency Use Authorization (EUA).  This EUA will remain in effect (meaning this test can be used) for the duration of the COVID-19 declaration under Section 564(b)(1) of the Act, 21 U.S.C. section 360bbb-3(b)(1), unless the authorization is terminated or revoked sooner. Performed at [facility] Lab, [address] [place], [place] [address]     Blood Alcohol level:  Lab Results  Component Value Date   ETH <10 07/10/2018    Metabolic Disorder Labs:  Lab Results  Component Value Date   HGBA1C 5.5 07/22/2017   MPG 111.15  07/22/2017   No results found for: PROLACTIN No results found for: CHOL, TRIG, HDL, CHOLHDL, VLDL, LDLCALC  Current Medications: Current Facility-Administered Medications  Medication Dose Route Frequency Provider Last Rate Last Dose  . acebutolol (SECTRAL) capsule 200 mg  200 mg Oral Daily Charm Rings, NP   200 mg at 07/11/18 1031  . acetaminophen (TYLENOL) tablet 1,000 mg  1,000 mg Oral Q6H PRN Charm Rings, NP   1,000 mg at 07/11/18 0829  . albuterol (VENTOLIN HFA) 108 (90 Base) MCG/ACT inhaler 2 puff  2 puff Inhalation Q6H PRN Charm Rings, NP      . ALPRAZolam Prudy Feeler) tablet 0.5 mg  0.5 mg Oral TID PRN Antonieta Pert, MD   0.5 mg at 07/11/18 0829  . alum & mag hydroxide-simeth (MAALOX/MYLANTA) 200-200-20 MG/5ML suspension 30 mL  30 mL Oral Q4H PRN Charm Rings, NP      . aspirin EC tablet 81 mg  81 mg Oral Daily Charm Rings, NP   81 mg at 07/11/18 0757  . buPROPion (WELLBUTRIN XL) 24 hr tablet 150 mg  150 mg Oral Daily Charm Rings, NP   150 mg at 07/11/18 0757  . calcium carbonate (OS-CAL - dosed in mg of elemental calcium) tablet 1,250 mg  1,250 mg Oral Q breakfast Charm Rings, NP   1,250 mg at 07/11/18 1031  . magnesium hydroxide (MILK OF MAGNESIA) suspension 30 mL  30 mL Oral Daily PRN Charm Rings, NP      . mirtazapine (REMERON) tablet 45 mg  45 mg Oral QHS Charm Rings, NP      . multivitamin with minerals tablet 1 tablet  1 tablet Oral Daily Charm Rings, NP   1 tablet at 07/11/18 0757  . pantoprazole (PROTONIX) EC tablet 40 mg  40 mg Oral Daily Charm Rings, NP   40 mg at 07/11/18 0757  . QUEtiapine (SEROQUEL) tablet 50 mg  50 mg Oral QHS Antonieta Pert, MD       PTA Medications: Medications Prior to Admission  Medication Sig Dispense Refill Last Dose  . acebutolol (SECTRAL) 200 MG capsule Take 1 capsule (200 mg total) by mouth daily. 30 capsule 3 07/10/2018 at 0800  . acetaminophen (TYLENOL) 500 MG tablet Take 1,000 mg by mouth every  6 (six) hours as needed for mild pain or fever.    Past Month at Unknown time  . albuterol (PROVENTIL HFA;VENTOLIN HFA) 108 (90 Base) MCG/ACT inhaler Inhale 2 puffs into the lungs every 6 (six) hours as needed for wheezing or shortness of breath.   Past Month at Unknown time  . ALPRAZolam (XANAX) 0.5 MG tablet Take 0.5 mg by mouth 2 (two) times daily.    07/10/2018 at Unknown time  . amoxicillin (AMOXIL) 500 MG capsule Take 2,000 mg (  4 tablets) 45 minutes prior to dental procedures. 12 capsule 2 Past Month at Unknown time  . aspirin EC 81 MG tablet Take 81 mg by mouth daily.   07/10/2018 at Unknown time  . buPROPion (WELLBUTRIN XL) 150 MG 24 hr tablet Take 150 mg by mouth daily.   07/10/2018 at Unknown time  . calcium carbonate (OS-CAL) 600 MG TABS tablet Take 600 mg by mouth daily.    07/10/2018 at Unknown time  . cyclobenzaprine (FLEXERIL) 10 MG tablet Take 10 mg by mouth at bedtime as needed for muscle spasms.   1 Past Month at Unknown time  . fluticasone (FLONASE) 50 MCG/ACT nasal spray Place 1 spray into both nostrils daily as needed for allergies.    Past Month at Unknown time  . Fluticasone-Salmeterol (ADVAIR) 250-50 MCG/DOSE AEPB Inhale 1 puff into the lungs 2 (two) times daily.   07/10/2018 at Unknown time  . mirtazapine (REMERON) 45 MG tablet Take 45 mg by mouth at bedtime.   07/09/2018 at Unknown time  . Multiple Vitamin (MULTIVITAMIN) capsule Take 1 capsule by mouth daily.   07/10/2018 at Unknown time  . naproxen sodium (ALEVE) 220 MG tablet Take 1 tablet (220 mg total) by mouth daily as needed. (Patient taking differently: Take 220 mg by mouth daily as needed (for pain). )   Past Month at Unknown time  . omeprazole (PRILOSEC) 20 MG capsule Take 20 mg by mouth daily.   07/10/2018 at Unknown time    Musculoskeletal: Strength & Muscle Tone: within normal limits Gait & Station: normal Patient leans: N/A  Psychiatric Specialty Exam: Physical Exam  Nursing note and vitals  reviewed. Constitutional: She is oriented to person, place, and time. She appears well-developed and well-nourished.  HENT:  Head: Normocephalic and atraumatic.  Respiratory: Effort normal.  Neurological: She is oriented to person, place, and time.    ROS  Blood pressure 120/74, pulse (!) 115, temperature 97.9 F (36.6 C), temperature source Oral, resp. rate 18, height 5\' 3"  (1.6 m), weight 75.8 kg, SpO2 100 %.Body mass index is 29.58 kg/m.  General Appearance: Disheveled  Eye Contact:  Fair  Speech:  Normal Rate  Volume:  Normal  Mood:  Anxious and Depressed  Affect:  Congruent  Thought Process:  Coherent and Descriptions of Associations: Loose  Orientation:  Full (Time, Place, and Person)  Thought Content:  Delusions and Hallucinations: Auditory  Suicidal Thoughts:  Yes.  without intent/plan  Homicidal Thoughts:  No  Memory:  Immediate;   Fair Recent;   Fair Remote;   Fair  Judgement:  Impaired  Insight:  Lacking  Psychomotor Activity:  Increased  Concentration:  Concentration: Fair and Attention Span: Fair  Recall:  [employer]  Fund of Knowledge:  Fair  Language:  Fair  Akathisia:  Negative  Handed:  Right  AIMS (if indicated):     Assets:  Desire for Improvement Resilience  ADL's:  Intact  Cognition:  WNL  Sleep:  Number of Hours: 1.5    Treatment Plan Summary: Daily contact with patient to assess and evaluate symptoms and progress in treatment, Medication management and Plan :  Patient is seen and examined.  Patient is a 63 year old female with the above-stated past psychiatric history who presented to the behavioral health hospital with depression, anxiety, crying spells, poor sleep and what appears to be auditory hallucinations and paranoid delusions.  She will be admitted to the hospital.  She will be integrated into the milieu.  She will be encouraged to  attend groups.  She will be encouraged to work on her coping skills.  She has been taking Xanax as well as bupropion  and mirtazapine.  I will restart her Xanax at 0.5 mg p.o. 3 times daily as needed anxiety.  We will continue the Wellbutrin XL at 150 mg p.o. daily, and we will continue the mirtazapine at 45 mg p.o. nightly.  I am going to add Seroquel 50 mg p.o. nightly for sleep and psychosis.  Hopefully this will be beneficial.  They did not do a urinalysis on her, and as well had not done a thyroid function study.  I will order those today.  Hopefully we can get her turned around.  There is some concern for underlying dementia, and her CT scan of the brain was essentially negative.  I will also order a B12, folate and an RPR for the reversible dementia work-up.  Observation Level/Precautions:  15 minute checks  Laboratory:  Chemistry Profile  Psychotherapy:    Medications:    Consultations:    Discharge Concerns:    Estimated LOS:  Other:     Physician Treatment Plan for Primary Diagnosis: MDD (major depressive disorder), recurrent severe, without psychosis (HCC) Long Term Goal(s): Improvement in symptoms so as ready for discharge  Short Term Goals: Ability to identify changes in lifestyle to reduce recurrence of condition will improve, Ability to verbalize feelings will improve, Ability to disclose and discuss suicidal ideas, Ability to demonstrate self-control will improve, Ability to identify and develop effective coping behaviors will improve and Ability to maintain clinical measurements within normal limits will improve  Physician Treatment Plan for Secondary Diagnosis: Principal Problem:   MDD (major depressive disorder), recurrent severe, without psychosis (HCC) Active Problems:   Major depressive disorder, single episode, severe, with psychosis (HCC)  Long Term Goal(s): Improvement in symptoms so as ready for discharge  Short Term Goals: Ability to identify changes in lifestyle to reduce recurrence of condition will improve, Ability to verbalize feelings will improve, Ability to disclose and  discuss suicidal ideas, Ability to demonstrate self-control will improve, Ability to identify and develop effective coping behaviors will improve and Ability to maintain clinical measurements within normal limits will improve  I certify that inpatient services furnished can reasonably be expected to improve the patient's condition.    Antonieta Pert, MD 5/19/202012:39 PM

## 2018-07-11 NOTE — Plan of Care (Signed)
Progress note  Pt found in bed; compliant with medication administration. Pt is visibly anxious and has complaints of sinus pain. Pt denies any other symptoms. Pt provided support and encouragement. Pt given medication per protocol and standing orders. Pt has been resting most of the day but still is suffering from anxiety. PRN's provided. Q78m safety checks implemented and continued. Pt denies si/hi/ah/vh and verbally agrees to approach staff if these become apparent or before harming herself/others while at [facility]. Pt safe on the unit. Will continue to monitor.   Pt progressing in the following metrics  Problem: Education: Goal: Knowledge of [facility] Education information/materials will improve Outcome: Progressing Goal: Emotional status will improve Outcome: Progressing Goal: Mental status will improve Outcome: Progressing Goal: Verbalization of understanding the information provided will improve Outcome: Progressing

## 2018-07-11 NOTE — Progress Notes (Signed)
Miranda Berger is a 63 y.o. female Voluntary admitted due to increased anxiety. Pt stated she does not feel safe at home since they found out that her son and girl friend were expecting a baby. She stated they take over the house, moving her things around in the house and some of her things has been missing. Pt feels like her husband has been taking their side. Pt state she has tried to run away 3 times in the past. Pt stated she is not sure if she will be going back to the same living as she think that both her husband and her son are being manipulated. Pt stated she is looking for help with her medications, her mental and physical needs. Pt calm and cooperative with the admission process , denies SI/HI. AVH and verbally contracted for safety. Consents signed, skin/belongings search completed and pt oriented to unit. Pt stable at this time. Pt given the opportunity to express concerns and ask questions. Pt given toiletries. Will continue to monitor.

## 2018-07-11 NOTE — [facility] Suicide Risk Assessment (Signed)
[facility]   Nursing information obtained from:  Patient Demographic factors:  Unemployed, Caucasian Current Mental Status:  NA Loss Factors:  Decline in physical health, Loss of significant relationship Historical Factors:  NA Risk Reduction Factors:  Religious beliefs about death, Living with another person, especially a relative  Total Time spent with patient: 30 minutes Principal Problem: MDD (major depressive disorder), recurrent severe, without psychosis (HCC) Diagnosis:  Principal Problem:   MDD (major depressive disorder), recurrent severe, without psychosis (HCC) Active Problems:   Major depressive disorder, single episode, severe, with psychosis (HCC)  Subjective Data: Patient is seen and examined.  Patient is a 63 year old female with a longstanding past psychiatric history significant for major depression who presented to the behavioral health hospital as a walk-in patient on 07/10/2018.  She came with her husband.  She stated she did not know exactly what was wrong, but her son and his girlfriend had moved in with them in November, and they had a baby coming and thinks this got worse.  The patient stated that she heard voices in the house.  She thought it might be the girlfriend's voice from the room next door, but she looked in there and was not there.  She also felt paranoid that everyone was against her.  She stated she had been holding the baby, and salt everyone move around her.  She stated that she had been treated for depression for many years.  She been previously treated with Zoloft, but apparently that was not effective and she has been placed on mirtazapine and Wellbutrin for at least a year.  She stated that her sleep is been doing poorly as well.  She admitted to crying spells, having helplessness, hopelessness and worthlessness.  She was admitted to the hospital for evaluation and stabilization.  Continued Clinical Symptoms:  Alcohol Use  Disorder Identification Test Final Score (AUDIT): 0 The "Alcohol Use Disorders Identification Test", Guidelines for Use in Primary Care, Second Edition.  World Science writerHealth [employer] [facility](WHO). Score between 0-7:  no or low risk or alcohol related problems. Score between 8-15:  moderate risk of alcohol related problems. Score between 16-19:  high risk of alcohol related problems. Score 20 or above:  warrants further diagnostic evaluation for alcohol dependence and treatment.   CLINICAL FACTORS:   Depression:   Anhedonia Delusional Hopelessness Impulsivity Insomnia   Musculoskeletal: Strength & Muscle Tone: within normal limits Gait & Station: normal Patient leans: N/A  Psychiatric Specialty Exam: Physical Exam  Nursing note and vitals reviewed. Constitutional: She is oriented to person, place, and time. She appears well-developed and well-nourished.  HENT:  Head: Normocephalic and atraumatic.  Respiratory: Effort normal.  Neurological: She is alert and oriented to person, place, and time.    ROS  Blood pressure 120/74, pulse (!) 115, temperature 97.9 F (36.6 C), temperature source Oral, resp. rate 18, height 5\' 3"  (1.6 m), weight 75.8 kg, SpO2 100 %.Body mass index is 29.58 kg/m.  General Appearance: Disheveled  Eye Contact:  Fair  Speech:  Normal Rate  Volume:  Decreased  Mood:  Anxious and Depressed  Affect:  Congruent  Thought Process:  Coherent and Descriptions of Associations: Loose  Orientation:  Full (Time, Place, and Person)  Thought Content:  Delusions and Hallucinations: Auditory  Suicidal Thoughts:  Yes.  without intent/plan  Homicidal Thoughts:  No  Memory:  Immediate;   Fair Recent;   Fair Remote;   Fair  Judgement:  Impaired  Insight:  Lacking  Psychomotor Activity:  Increased  Concentration:  Concentration: Fair and Attention Span: Fair  Recall:  [employer] of Knowledge:  Fair  Language:  Fair  Akathisia:  Negative  Handed:  Right  AIMS (if  indicated):     Assets:  Desire for Improvement Housing Intimacy Physical Health Resilience Social Support  ADL's:  Intact  Cognition:  WNL  Sleep:  Number of Hours: 1.5      COGNITIVE FEATURES THAT CONTRIBUTE TO RISK:  None    SUICIDE RISK:   Mild:  Suicidal ideation of limited frequency, intensity, duration, and specificity.  There are no identifiable plans, no associated intent, mild dysphoria and related symptoms, good self-control (both objective and subjective assessment), few other risk factors, and identifiable protective factors, including available and accessible social support.  PLAN OF CARE: Patient is seen and examined.  Patient is a 63 year old female with the above-stated past psychiatric history who presented to the behavioral health hospital with depression, anxiety, crying spells, poor sleep and what appears to be auditory hallucinations and paranoid delusions.  She will be admitted to the hospital.  She will be integrated into the milieu.  She will be encouraged to attend groups.  She will be encouraged to work on her coping skills.  She has been taking Xanax as well as bupropion and mirtazapine.  I will restart her Xanax at 0.5 mg p.o. 3 times daily as needed anxiety.  We will continue the Wellbutrin XL at 150 mg p.o. daily, and we will continue the mirtazapine at 45 mg p.o. nightly.  I am going to add Seroquel 50 mg p.o. nightly for sleep and psychosis.  Hopefully this will be beneficial.  They did not do a urinalysis on her, and as well had not done a thyroid function study.  I will order those today.  Hopefully we can get her turned around.  There is some concern for underlying dementia, and her CT scan of the brain was essentially negative.  I will also order a B12, folate and an RPR for the reversible dementia work-up.  I certify that inpatient services furnished can reasonably be expected to improve the patient's condition.   Antonieta Pert, MD 07/11/2018, 11:14  AM

## 2018-07-11 NOTE — Tx Team (Signed)
Initial Treatment Plan 07/11/2018 4:09 AM Miranda Berger IOX:[id number]    PATIENT STRESSORS: Marital or family conflict Medication change or noncompliance Occupational concerns   PATIENT STRENGTHS: Ability for insight Communication skills Motivation for treatment/growth Supportive family/friends   PATIENT IDENTIFIED PROBLEMS: Depression  anxiety  " Be on the right medications"  "Get help both mental and physical"               DISCHARGE CRITERIA:  Ability to meet basic life and health needs Adequate post-discharge living arrangements Improved stabilization in mood, thinking, and/or behavior Medical problems require only outpatient monitoring  PRELIMINARY DISCHARGE PLAN: Attend aftercare/continuing care group Attend PHP/IOP Outpatient therapy Return to previous living arrangement  PATIENT/FAMILY INVOLVEMENT: This treatment plan has been presented to and reviewed with the patient, Miranda Berger, and/or family member.  The patient and family have been given the opportunity to ask questions and make suggestions.  Bethann Punches, RN 07/11/2018, 4:09 AM

## 2018-07-11 NOTE — Progress Notes (Signed)
CSW attempted to meet with patient to complete PSA. Patient requested CSW return at a later time, so patient could continue sleeping.  Enid Cutter, LCSW-A Clinical Social Worker

## 2018-07-12 LAB — URINALYSIS, COMPLETE (UACMP) WITH MICROSCOPIC
Bacteria, UA: NONE SEEN
Bilirubin Urine: NEGATIVE
Glucose, UA: NEGATIVE mg/dL
Ketones, ur: NEGATIVE mg/dL
Nitrite: NEGATIVE
Protein, ur: NEGATIVE mg/dL
Specific Gravity, Urine: 1.01 (ref 1.005–1.030)
pH: 6 (ref 5.0–8.0)

## 2018-07-12 MED ORDER — BUPROPION HCL ER (XL) 300 MG PO TB24
300.0000 mg | ORAL_TABLET | Freq: Every day | ORAL | Status: DC
  Administered 2018-07-13 – 2018-07-17 (×5): 300 mg via ORAL
  Filled 2018-07-12 (×8): qty 1

## 2018-07-12 MED ORDER — MELOXICAM 7.5 MG PO TABS
7.5000 mg | ORAL_TABLET | Freq: Every day | ORAL | Status: DC
  Administered 2018-07-12 – 2018-07-13 (×2): 7.5 mg via ORAL
  Filled 2018-07-12 (×4): qty 1

## 2018-07-12 NOTE — [facility] Suicide Risk Assessment (Signed)
[facility] INPATIENT:  Family/Significant Other Suicide Prevention Education  Suicide Prevention Education:  Education Completed; with husband, Miranda Berger [phone]) has been identified by the patient as the family member/significant other with whom the patient will be residing, and identified as the person(s) who will aid the patient in the event of a mental health crisis (suicidal ideations/suicide attempt).  With written consent from the patient, the family member/significant other has been provided the following suicide prevention education, prior to the and/or following the discharge of the patient.  The suicide prevention education provided includes the following:  Suicide risk factors  Suicide prevention and interventions  National Suicide Hotline telephone number  [facility] assessment telephone number  [facility] Emergency Assistance 911  [facility] and/or Residential Mobile Crisis Unit telephone number  Request made of family/significant other to:  Remove weapons (e.g., guns, rifles, knives), all items previously/currently identified as safety concern.    Remove drugs/medications (over-the-counter, prescriptions, illicit drugs), all items previously/currently identified as a safety concern.  The family member/significant other verbalizes understanding of the suicide prevention education information provided.  The family member/significant other agrees to remove the items of safety concern listed above.  Miranda Berger reports that he does not have any concerns, however he did question whether the patient's medication regiment caused her most recent hospitalization. He reports the patient takes a lot of medications and he is not sure of she takes as prescribed. He requested to speak to the patient's attending RN for more specific information regarding her medications. Miranda Berger reports that he does not have any other additional concerns and that the patient will be returning  home with him at discharge.   CSW will continue to follow.   Miranda Berger 07/12/2018, 2:27 PM

## 2018-07-12 NOTE — Progress Notes (Signed)
Recreation Therapy Notes  Date:  5.20.20 Time: 0930 Location: [address] Dayroom  Group Topic: Stress Management  Goal Area(s) Addresses:  Patient will identify positive stress management techniques. Patient will identify benefits of using stress management post d/c.  Intervention: Stress Management  Activity :  [employer].  LRT played a meditation that focused on taking on the stillness and resilience of mountain.  Patients were to listen as the meditation played to fully engage in the activity.    Education:  Stress Management, Discharge Planning.   Education Outcome: Acknowledges Education  Clinical Observations/Feedback:  Pt did not attend group.    Caroll Rancher, LRT/CTRS         Lillia Abed, Keontae Levingston A 07/12/2018 11:10 AM

## 2018-07-12 NOTE — Progress Notes (Signed)
The patient's positive event for the day is that she is "facing" her issues.

## 2018-07-12 NOTE — Progress Notes (Signed)
CSW spoke with patient on unit at patient's request. Patient is tearful during conversation, but appropriate. She expresses an eagerness to getting back to her life and being able to enjoy things again including her home and her family. She expresses that she is open to any treatment suggestions or specific suggestions for outpatient follow up.  Much of her anxiety and depression is attributed to disruption and conflict in her family home. CSW inquired of patient has spoken with her spouse regarding healthy boundaries for their son and his family or ways that the patient can be supported. Patient reports she has not has that conversation but she wants to. CSW offered support and encouragement. Patient granted CSW permission to contact her spouse for SPE.  Enid Cutter, LCSW-A Clinical Social Worker

## 2018-07-12 NOTE — Progress Notes (Signed)
[place] NOVEL CORONAVIRUS (COVID-19) DAILY CHECK-OFF SYMPTOMS - answer yes or no to each - every day NO YES  Have you had a fever in the past 24 hours?  . Fever (Temp > 37.80C / 100F) X   Have you had any of these symptoms in the past 24 hours? . New Cough .  Sore Throat  .  Shortness of Breath .  Difficulty Breathing .  Unexplained Body Aches   X   Have you had any one of these symptoms in the past 24 hours not related to allergies?   . Runny Nose .  Nasal Congestion .  Sneezing   X   If you have had runny nose, nasal congestion, sneezing in the past 24 hours, has it worsened?  X   EXPOSURES - check yes or no X   Have you traveled outside the state in the past 14 days?  X   Have you been in contact with someone with a confirmed diagnosis of COVID-19 or PUI in the past 14 days without wearing appropriate PPE?  X   Have you been living in the same home as a person with confirmed diagnosis of COVID-19 or a PUI (household contact)?    X   Have you been diagnosed with COVID-19?    X              What to do next: Answered NO to all: Answered YES to anything:   Proceed with unit schedule Follow the [facility] Inpatient Flowsheet.   

## 2018-07-12 NOTE — Progress Notes (Signed)
Miranda Berger was anxious on approach. She endorses AVH stating "I hear my own voices and fears and I see things but I don't know what they mean; It's like Im second guessing myself". Pt states she had a good phone call with her husband and they are "working things out". Pt is apprehensive about taking scheduled medications; med education was given. Pt states she was feeling tired and could sleep without it. Support offered and will continue with POC.

## 2018-07-12 NOTE — Progress Notes (Signed)
Patient self inventory- Patient slept well last night, sleep medication was helpful. Depression, hopelessness, and anxiety rated 3, 3, 4 out of 10. Patient endorses cravings, agitation, chilling, and irritability. Denies SI HI AVH. Endorses lightheadedness, dizziness, headaches, and pain rated 3/10 in back. Patient's goal is to work on "my relationship with my husband; seeing him."

## 2018-07-12 NOTE — Progress Notes (Signed)
Pt noted,"needs to speak with her spouse in person because he a part of my treatment and there are some things I need to speak with him about in person."

## 2018-07-12 NOTE — Plan of Care (Addendum)
Patient was pleasant upon approach, complaining of headache rated 9/10. Tylenol given and was relieved after about an hour. Denies SI HI AVH. Denies physical problems. Patient is compliant with medications. No side effects noted.  Safety is maintained with 15 minute checks as well as environmental checks. Will continue to monitor and assess.  Problem: Education: Goal: Emotional status will improve Outcome: Progressing Goal: Mental status will improve Outcome: Progressing Goal: Verbalization of understanding the information provided will improve Outcome: Progressing

## 2018-07-12 NOTE — Tx Team (Signed)
Interdisciplinary Treatment and Diagnostic Plan Update  07/12/2018 Time of Session: 9:17am Miranda Berger MRN: [id number]  Principal Diagnosis: MDD (major depressive disorder), recurrent severe, without psychosis (HCC)  Secondary Diagnoses: Principal Problem:   MDD (major depressive disorder), recurrent severe, without psychosis (HCC) Active Problems:   Major depressive disorder, single episode, severe, with psychosis (HCC)   Current Medications:  Current Facility-Administered Medications  Medication Dose Route Frequency Provider Last Rate Last Dose  . acebutolol (SECTRAL) capsule 200 mg  200 mg Oral Daily Charm Rings, NP   200 mg at 07/11/18 1031  . acetaminophen (TYLENOL) tablet 1,000 mg  1,000 mg Oral Q6H PRN Charm Rings, NP   1,000 mg at 07/12/18 0809  . albuterol (VENTOLIN HFA) 108 (90 Base) MCG/ACT inhaler 2 puff  2 puff Inhalation Q6H PRN Charm Rings, NP      . ALPRAZolam Prudy Feeler) tablet 0.5 mg  0.5 mg Oral TID PRN Antonieta Pert, MD   0.5 mg at 07/12/18 0914  . alum & mag hydroxide-simeth (MAALOX/MYLANTA) 200-200-20 MG/5ML suspension 30 mL  30 mL Oral Q4H PRN Charm Rings, NP      . aspirin EC tablet 81 mg  81 mg Oral Daily Charm Rings, NP   81 mg at 07/12/18 0810  . buPROPion (WELLBUTRIN XL) 24 hr tablet 150 mg  150 mg Oral Daily Charm Rings, NP   150 mg at 07/12/18 0810  . calcium carbonate (OS-CAL - dosed in mg of elemental calcium) tablet 1,250 mg  1,250 mg Oral Q breakfast Charm Rings, NP   1,250 mg at 07/11/18 1031  . magnesium hydroxide (MILK OF MAGNESIA) suspension 30 mL  30 mL Oral Daily PRN Charm Rings, NP      . mirtazapine (REMERON) tablet 45 mg  45 mg Oral QHS Charm Rings, NP      . multivitamin with minerals tablet 1 tablet  1 tablet Oral Daily Charm Rings, NP   1 tablet at 07/12/18 0810  . pantoprazole (PROTONIX) EC tablet 40 mg  40 mg Oral Daily Charm Rings, NP   40 mg at 07/12/18 0810  . QUEtiapine (SEROQUEL) tablet 50 mg   50 mg Oral QHS Antonieta Pert, MD   50 mg at 07/11/18 2140   PTA Medications: Medications Prior to Admission  Medication Sig Dispense Refill Last Dose  . acebutolol (SECTRAL) 200 MG capsule Take 1 capsule (200 mg total) by mouth daily. 30 capsule 3 07/10/2018 at 0800  . acetaminophen (TYLENOL) 500 MG tablet Take 1,000 mg by mouth every 6 (six) hours as needed for mild pain or fever.    Past Month at Unknown time  . albuterol (PROVENTIL HFA;VENTOLIN HFA) 108 (90 Base) MCG/ACT inhaler Inhale 2 puffs into the lungs every 6 (six) hours as needed for wheezing or shortness of breath.   Past Month at Unknown time  . ALPRAZolam (XANAX) 0.5 MG tablet Take 0.5 mg by mouth 2 (two) times daily.    07/10/2018 at Unknown time  . amoxicillin (AMOXIL) 500 MG capsule Take 2,000 mg (4 tablets) 45 minutes prior to dental procedures. 12 capsule 2 Past Month at Unknown time  . aspirin EC 81 MG tablet Take 81 mg by mouth daily.   07/10/2018 at Unknown time  . buPROPion (WELLBUTRIN XL) 150 MG 24 hr tablet Take 150 mg by mouth daily.   07/10/2018 at Unknown time  . calcium carbonate (OS-CAL) 600 MG TABS tablet Take  600 mg by mouth daily.    07/10/2018 at Unknown time  . cyclobenzaprine (FLEXERIL) 10 MG tablet Take 10 mg by mouth at bedtime as needed for muscle spasms.   1 Past Month at Unknown time  . fluticasone (FLONASE) 50 MCG/ACT nasal spray Place 1 spray into both nostrils daily as needed for allergies.    Past Month at Unknown time  . Fluticasone-Salmeterol (ADVAIR) 250-50 MCG/DOSE AEPB Inhale 1 puff into the lungs 2 (two) times daily.   07/10/2018 at Unknown time  . mirtazapine (REMERON) 45 MG tablet Take 45 mg by mouth at bedtime.   07/09/2018 at Unknown time  . Multiple Vitamin (MULTIVITAMIN) capsule Take 1 capsule by mouth daily.   07/10/2018 at Unknown time  . naproxen sodium (ALEVE) 220 MG tablet Take 1 tablet (220 mg total) by mouth daily as needed. (Patient taking differently: Take 220 mg by mouth daily as  needed (for pain). )   Past Month at Unknown time  . omeprazole (PRILOSEC) 20 MG capsule Take 20 mg by mouth daily.   07/10/2018 at Unknown time    Patient Stressors: Marital or family conflict Medication change or noncompliance Occupational concerns  Patient Strengths: Ability for insight Barrister's clerkCommunication skills Motivation for treatment/growth Supportive family/friends  Treatment Modalities: Medication Management, Group therapy, Case management,  1 to 1 session with clinician, Psychoeducation, Recreational therapy.   Physician Treatment Plan for Primary Diagnosis: MDD (major depressive disorder), recurrent severe, without psychosis (HCC) Long Term Goal(s): Improvement in symptoms so as ready for discharge Improvement in symptoms so as ready for discharge   Short Term Goals: Ability to identify changes in lifestyle to reduce recurrence of condition will improve Ability to verbalize feelings will improve Ability to disclose and discuss suicidal ideas Ability to demonstrate self-control will improve Ability to identify and develop effective coping behaviors will improve Ability to maintain clinical measurements within normal limits will improve Ability to identify changes in lifestyle to reduce recurrence of condition will improve Ability to verbalize feelings will improve Ability to disclose and discuss suicidal ideas Ability to demonstrate self-control will improve Ability to identify and develop effective coping behaviors will improve Ability to maintain clinical measurements within normal limits will improve  Medication Management: Evaluate patient's response, side effects, and tolerance of medication regimen.  Therapeutic Interventions: 1 to 1 sessions, Unit Group sessions and Medication administration.  Evaluation of Outcomes: Progressing  Physician Treatment Plan for Secondary Diagnosis: Principal Problem:   MDD (major depressive disorder), recurrent severe, without  psychosis (HCC) Active Problems:   Major depressive disorder, single episode, severe, with psychosis (HCC)  Long Term Goal(s): Improvement in symptoms so as ready for discharge Improvement in symptoms so as ready for discharge   Short Term Goals: Ability to identify changes in lifestyle to reduce recurrence of condition will improve Ability to verbalize feelings will improve Ability to disclose and discuss suicidal ideas Ability to demonstrate self-control will improve Ability to identify and develop effective coping behaviors will improve Ability to maintain clinical measurements within normal limits will improve Ability to identify changes in lifestyle to reduce recurrence of condition will improve Ability to verbalize feelings will improve Ability to disclose and discuss suicidal ideas Ability to demonstrate self-control will improve Ability to identify and develop effective coping behaviors will improve Ability to maintain clinical measurements within normal limits will improve     Medication Management: Evaluate patient's response, side effects, and tolerance of medication regimen.  Therapeutic Interventions: 1 to 1 sessions, Unit Group sessions and  Medication administration.  Evaluation of Outcomes: Progressing   RN Treatment Plan for Primary Diagnosis: MDD (major depressive disorder), recurrent severe, without psychosis (HCC) Long Term Goal(s): Knowledge of disease and therapeutic regimen to maintain health will improve  Short Term Goals: Ability to participate in decision making will improve, Ability to verbalize feelings will improve, Ability to disclose and discuss suicidal ideas, Ability to identify and develop effective coping behaviors will improve and Compliance with prescribed medications will improve  Medication Management: RN will administer medications as ordered by provider, will assess and evaluate patient's response and provide education to patient for prescribed  medication. RN will report any adverse and/or side effects to prescribing provider.  Therapeutic Interventions: 1 on 1 counseling sessions, Psychoeducation, Medication administration, Evaluate responses to treatment, Monitor vital signs and CBGs as ordered, Perform/monitor CIWA, COWS, AIMS and Fall Risk screenings as ordered, Perform wound care treatments as ordered.  Evaluation of Outcomes: Progressing   LCSW Treatment Plan for Primary Diagnosis: MDD (major depressive disorder), recurrent severe, without psychosis (HCC) Long Term Goal(s): Safe transition to appropriate next level of care at discharge, Engage patient in therapeutic group addressing interpersonal concerns.  Short Term Goals: Engage patient in aftercare planning with referrals and resources and Increase skills for wellness and recovery  Therapeutic Interventions: Assess for all discharge needs, 1 to 1 time with Social worker, Explore available resources and support systems, Assess for adequacy in community support network, Educate family and significant other(s) on suicide prevention, Complete Psychosocial Assessment, Interpersonal group therapy.  Evaluation of Outcomes: Progressing   Progress in Treatment: Attending groups: No. Participating in groups: No. Taking medication as prescribed: Yes. Toleration medication: Yes. Family/Significant other contact made: No, will contact:  psychiatrist Deatra Robinson) Patient understands diagnosis: Yes. Discussing patient identified problems/goals with staff: Yes. Medical problems stabilized or resolved: Yes. Denies suicidal/homicidal ideation: Yes. Issues/concerns per patient self-inventory: No. Other:   New problem(s) identified: No, Describe:  None  New Short Term/Long Term Goal(s):  Patient Goals:  "Take care of myself"  Discharge Plan or Barriers:   Reason for Continuation of Hospitalization: Anxiety Medication stabilization  Estimated Length of Stay: 2-3  days  Attendees: Patient: 07/12/2018  Physician: Dr. Landry Mellow, MD 07/12/2018   Nursing: Arlyss Repress, RN 07/12/2018  RN Care Manager: 07/12/2018   Social Worker: Stephannie Peters, LCSW 07/12/2018   Recreational Therapist:  07/12/2018   Other:  07/12/2018   Other:  07/12/2018   Other: 07/12/2018     Scribe for Treatment Team: Delphia Grates, LCSW 07/12/2018 9:28 AM

## 2018-07-12 NOTE — [facility] Group Notes (Signed)
Occupational Therapy Group Note  Date:  07/12/2018 Time:  1:27 PM  Group Topic/Focus:  Leisure Group  Participation Level:  Active  Participation Quality:  Appropriate  Affect:  Blunted  Cognitive:  Appropriate  Insight: Improving  Engagement in Group:  Engaged  Modes of Intervention:  Activity, Discussion, Education and Socialization  Additional Comments:    S: "I can say I am very thankful to be here with you guys who make me laugh"  O: OT group focus on leisure this date, while incorporating coping skills to ensure understanding. Pts to play game of Uno: and name a struggle they're facing, a communication skill, something they like about yourself, stress management, and a healthy way to manage anger per certain cards played. Pt also assessed for attention, ability to follow rules, and temperament with rules.   A: Pt presents with blunted affect, engaged and participatory throughout session. Pt shares that going for a walk and having "me time" are stress management skills she uses. Pt participated well in game, despite not knowing how to play and was very pleasant and eager to engage in coping skill review.  P: OT group will be x1 per week while pt inpatient   Dalphine Handing, MSOT, OTR/L Behavioral Health OT/ Acute Relief OT PHP Office: [phone]  Dalphine Handing 07/12/2018, 1:27 PM

## 2018-07-12 NOTE — Progress Notes (Signed)
[facility] MD Progress Note  07/12/2018 12:05 PM Miranda Berger  MRN:  [id number] Subjective:  Patient is a 63 year old female with a longstanding past psychiatric history significant for major depression who presented to the behavioral health hospital as a walk-in patient on 07/10/2018. She came with her husband. She stated she did not know exactly what was wrong, but her son and his girlfriend had moved in with them in November, and they had a baby coming and thinks this got worse. The patient stated that she heard voices in the house. She thought it might be the girlfriend's voice from the room next door, but she looked in there and was not there. She also felt paranoid that everyone was against her.  Objective: Patient is seen and examined.  Patient is a 63 year old female with the above-stated past psychiatric history who is seen in follow-up.  Patient stated she felt a bit better today.  Initially she said she felt well, but then we talked about the fact that she had gotten tearful while talking to social work.  She is still very tearful at times.  She stated that she wants to live, she does not want to die, and wants to be with her family.  She was less focused today on some of the symptoms of psychosis that seem to be apparent.  She denied suicidal ideation.  Review of her laboratories revealed some hematuria.  Review of her old records showed she had been treated previously for interstitial cystitis and pelvic floor pain.  She also had had a lot of chest wall pain that was not related to cardiac issues after her aortic valve surgery.  Her vital signs are stable, she is afebrile.  She slept 6.75 hours last night.  Principal Problem: MDD (major depressive disorder), recurrent severe, without psychosis (HCC) Diagnosis: Principal Problem:   MDD (major depressive disorder), recurrent severe, without psychosis (HCC) Active Problems:   Major depressive disorder, single episode, severe, with psychosis  (HCC)  Total Time spent with patient: 20 minutes  Past Psychiatric History: See admission H&P  Past Medical History:  Past Medical History:  Diagnosis Date  . Anemia    in the past   . Anxiety   . Asthma   . Depression   . Family history of adverse reaction to anesthesia    mom had nausea  . GERD (gastroesophageal reflux disease)   . Hepatitis    Hepatitis A in 3rd grade?  [name] Hypertension   . PONV (postoperative nausea and vomiting)   . 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 Sleep apnea    uses CPAP, 12, starts at 6    Past Surgical History:  Procedure Laterality Date  . ABDOMINAL HYSTERECTOMY    . AORTIC VALVE REPLACEMENT N/A 07/26/2017   Procedure: MINIMALLY INVASIVE AORTIC VALVE REPLACEMENT (AVR);  Surgeon: Purcell Nails, MD;  Location: [facility] OR;  Service: Open Heart Surgery;  Laterality: N/A;  . BLADDER SURGERY  2011  . CARDIAC CATHETERIZATION    . CARPAL TUNNEL RELEASE  2012  . CHOLECYSTECTOMY    . RIGHT/LEFT HEART CATH AND CORONARY ANGIOGRAPHY N/A 07/08/2017   Procedure: RIGHT/LEFT HEART CATH AND CORONARY ANGIOGRAPHY;  Surgeon: Tonny Bollman, MD;  Location: [facility] INVASIVE CV LAB;  Service: Cardiovascular;  Laterality: N/A;  . TEE WITHOUT CARDIOVERSION N/A 07/26/2017   Procedure: TRANSESOPHAGEAL ECHOCARDIOGRAM (TEE);  Surgeon: Purcell Nails, MD;  Location: [facility] OR;  Service:  Open Heart Surgery;  Laterality: N/A;  . TONSILLECTOMY     Family History:  Family History  Problem Relation Age of Onset  . Stroke Mother   . Leukemia Mother   . Stroke Father   . Sleep apnea Father   . Valvular heart disease Brother   . Heart Problems Maternal Aunt   . Valvular heart disease Paternal Grandfather   . Valvular heart disease Maternal Aunt    Family Psychiatric  History: See admission H&P Social History:  Social History   Substance and Sexual Activity  Alcohol Use No  . Alcohol/week: 0.0 standard  drinks     Social History   Substance and Sexual Activity  Drug Use No    Social History   Socioeconomic History  . Marital status: Married    Spouse name: Not on file  . Number of children: 1  . Years of education: BS  . Highest education level: Not on file  Occupational History  . Not on file  Social Needs  . Financial resource strain: Not on file  . Food insecurity:    Worry: Never true    Inability: Never true  . Transportation needs:    Medical: No    Non-medical: No  Tobacco Use  . Smoking status: Never Smoker  . Smokeless tobacco: Never Used  Substance and Sexual Activity  . Alcohol use: No    Alcohol/week: 0.0 standard drinks  . Drug use: No  . Sexual activity: Not on file  Lifestyle  . Physical activity:    Days per week: 0 days    Minutes per session: 0 min  . Stress: Rather much  Relationships  . Social connections:    Talks on phone: Not on file    Gets together: Not on file    Attends religious service: Not on file    Active member of club or organization: Not on file    Attends meetings of clubs or organizations: Not on file    Relationship status: Not on file  Other Topics Concern  . Not on file  Social History Narrative   Occasionally consumes tea or coffee   Additional Social History:    Pain Medications: See MARs Prescriptions: See MARs Over the Counter: See MARs History of alcohol / drug use?: Yes Longest period of sobriety (when/how long): "Months, I only drink socially with a friends" Negative Consequences of Use: (none) Withdrawal Symptoms: Other (Comment)(none) Name of Substance 1: Alcohol 1 - Age of First Use: unknown 1 - Amount (size/oz): "1 or 2 glasses of wine" 1 - Frequency: "when on a cruise or out" 1 - Duration: ongoing 1 - Last Use / Amount: over 6 months ago                  Sleep: Good  Appetite:  Fair  Current Medications: Current Facility-Administered Medications  Medication Dose Route Frequency  Provider Last Rate Last Dose  . acebutolol (SECTRAL) capsule 200 mg  200 mg Oral Daily Charm Rings, NP   Stopped at 07/12/18 1148  . acetaminophen (TYLENOL) tablet 1,000 mg  1,000 mg Oral Q6H PRN Charm Rings, NP   1,000 mg at 07/12/18 0809  . albuterol (VENTOLIN HFA) 108 (90 Base) MCG/ACT inhaler 2 puff  2 puff Inhalation Q6H PRN Charm Rings, NP      . ALPRAZolam Prudy Feeler) tablet 0.5 mg  0.5 mg Oral TID PRN Antonieta Pert, MD   0.5 mg at 07/12/18 0914  .  alum & mag hydroxide-simeth (MAALOX/MYLANTA) 200-200-20 MG/5ML suspension 30 mL  30 mL Oral Q4H PRN Charm RingsLord, Jamison Y, NP      . aspirin EC tablet 81 mg  81 mg Oral Daily Charm RingsLord, Jamison Y, NP   81 mg at 07/12/18 0810  . buPROPion (WELLBUTRIN XL) 24 hr tablet 150 mg  150 mg Oral Daily Charm RingsLord, Jamison Y, NP   150 mg at 07/12/18 0810  . calcium carbonate (OS-CAL - dosed in mg of elemental calcium) tablet 1,250 mg  1,250 mg Oral Q breakfast Charm RingsLord, Jamison Y, NP   1,250 mg at 07/11/18 1031  . magnesium hydroxide (MILK OF MAGNESIA) suspension 30 mL  30 mL Oral Daily PRN Charm RingsLord, Jamison Y, NP      . mirtazapine (REMERON) tablet 45 mg  45 mg Oral QHS Charm RingsLord, Jamison Y, NP      . multivitamin with minerals tablet 1 tablet  1 tablet Oral Daily Charm RingsLord, Jamison Y, NP   1 tablet at 07/12/18 0810  . pantoprazole (PROTONIX) EC tablet 40 mg  40 mg Oral Daily Charm RingsLord, Jamison Y, NP   40 mg at 07/12/18 0810  . QUEtiapine (SEROQUEL) tablet 50 mg  50 mg Oral QHS Antonieta Pertlary,  Lawson, MD   50 mg at 07/11/18 2140    Lab Results:  Results for orders placed or performed during the hospital encounter of 07/11/18 (from the past 48 hour(s))  Urinalysis, Complete w Microscopic     Status: Abnormal   Collection Time: 07/11/18  5:38 PM  Result Value Ref Range   Color, Urine AMBER (A) YELLOW    Comment: BIOCHEMICALS MAY BE AFFECTED BY COLOR   APPearance HAZY (A) CLEAR   Specific Gravity, Urine 1.013 1.005 - 1.030   pH 6.0 5.0 - 8.0   Glucose, UA NEGATIVE NEGATIVE mg/dL    Hgb urine dipstick LARGE (A) NEGATIVE   Bilirubin Urine NEGATIVE NEGATIVE   Ketones, ur NEGATIVE NEGATIVE mg/dL   Protein, ur NEGATIVE NEGATIVE mg/dL   Nitrite NEGATIVE NEGATIVE   Leukocytes,Ua TRACE (A) NEGATIVE   RBC / HPF >50 (H) 0 - 5 RBC/hpf   WBC, UA 6-10 0 - 5 WBC/hpf   Bacteria, UA RARE (A) NONE SEEN   Squamous Epithelial / LPF 0-5 0 - 5   Mucus PRESENT     Comment: Performed at [facility], [address] [place], [place] [address]  TSH     Status: None   Collection Time: 07/11/18  6:19 PM  Result Value Ref Range   TSH 1.834 0.350 - 4.500 uIU/mL    Comment: Performed by a 3rd Generation assay with a functional sensitivity of <=0.01 uIU/mL. Performed at [facility], [address] [place], [place] [address]   Folate     Status: None   Collection Time: 07/11/18  6:19 PM  Result Value Ref Range   Folate 22.5 >5.9 ng/mL    Comment: Performed at [facility], [address] [place], [place] [address]    Blood Alcohol level:  Lab Results  Component Value Date   ETH <10 07/10/2018    Metabolic Disorder Labs: Lab Results  Component Value Date   HGBA1C 5.5 07/22/2017   MPG 111.15 07/22/2017   No results found for: PROLACTIN No results found for: CHOL, TRIG, HDL, CHOLHDL, VLDL, LDLCALC  Physical Findings: AIMS: Facial and Oral Movements Muscles of Facial Expression: None, normal Lips and Perioral Area: None, normal Jaw: None, normal Tongue: None, normal,Extremity  Movements Upper (arms, wrists, hands, fingers): None, normal Lower (legs, knees, ankles, toes): None, normal, Trunk Movements Neck, shoulders, hips: None, normal, Overall Severity Severity of abnormal movements (highest score from questions above): None, normal Incapacitation due to abnormal movements: None, normal Patient's awareness of abnormal movements (rate only patient's report): No Awareness, Dental Status Current problems with teeth  and/or dentures?: No Does patient usually wear dentures?: No  CIWA:  CIWA-Ar Total: 5 COWS:  COWS Total Score: 5  Musculoskeletal: Strength & Muscle Tone: within normal limits Gait & Station: normal Patient leans: N/A  Psychiatric Specialty Exam: Physical Exam  Nursing note and vitals reviewed. Constitutional: She is oriented to person, place, and time. She appears well-developed and well-nourished.  HENT:  Head: Normocephalic and atraumatic.  Respiratory: Effort normal.  Neurological: She is alert and oriented to person, place, and time.    ROS  Blood pressure 100/78, pulse 78, temperature 97.9 F (36.6 C), temperature source Oral, resp. rate 18, height  (1.6 m), weight 75.8 kg, SpO2 100 %.Body mass index is 29.58 kg/m.  General Appearance: Casual  Eye Contact:  Fair  Speech:  Normal Rate  Volume:  Decreased  Mood:  Depressed  Affect:  Congruent  Thought Process:  Coherent and Descriptions of Associations: Intact  Orientation:  Full (Time, Place, and Person)  Thought Content:  Logical  Suicidal Thoughts:  No  Homicidal Thoughts:  No  Memory:  Immediate;   Fair Recent;   Fair Remote;   Fair  Judgement:  Intact  Insight:  Fair  Psychomotor Activity:  Increased  Concentration:  Concentration: Fair and Attention Span: Fair  Recall:  [employer] of Knowledge:  Fair  Language:  Fair  Akathisia:  Negative  Handed:  Right  AIMS (if indicated):     Assets:  Desire for Improvement Financial Resources/Insurance Housing Intimacy Resilience Social Support  ADL's:  Intact  Cognition:  WNL  Sleep:  Number of Hours: 6.75     Treatment Plan Summary: Daily contact with patient to assess and evaluate symptoms and progress in treatment, Medication management and Plan : Patient is seen and examined.  Patient is a 63 year old female with the above-stated past psychiatric history who is seen in follow-up.   Diagnosis: #1 major depression, recurrent, severe with psychotic  features, #2 generalized anxiety disorder, #3 musculoskeletal pain, #4 hematuria, #5 previous aortic valve replacement, #6 history of interstitial cystitis, #7 history of pelvic floor pain  Patient is seen and examined.  Patient is a 63 year old female with the above-stated past psychiatric history is seen in follow-up.  She still significantly depressed.  She is not ruminating on some of the psychotic things that were going on at home.  Her sleep is improved.  I am going to increase her Wellbutrin XL to 300 mg p.o. daily.  No other changes with her psychiatric medicines.  I am going to repeat her urinalysis to look for hematuria.  She also complains of skill skeletal pain.  Her previous cardiologist said she was able to take Naprosyn, so we will try some meloxicam 7.5 mg p.o. daily and titrate that during the course the hospitalization.  Her TSH came back at 1.834 which is normal.  Folic acid was 22.5. 1.  Continue acebutolol 200 mg p.o. daily for hypertension and heart rate control. 2.  Continue albuterol HFA 2 puffs inhaled every 6 hours as needed wheezing. 3.  Continue Xanax 0.5 mg p.o. 3 times daily as needed anxiety. 4.  Continue coated  aspirin 81 mg p.o. daily for heart health. 5.  Increase Wellbutrin XL to 300 mg p.o. daily for depression. 6.  Add meloxicam 7.5 mg p.o. daily for musculoskeletal pain. 7.  Continue mirtazapine 45 mg p.o. nightly for insomnia as well as depression and anxiety. 8.  Continue Seroquel 50 mg p.o. nightly for insomnia and psychotic features. 9.  Repeat urinalysis secondary to hematuria. 10.  Continue Protonix 40 mg p.o. daily for reflux. 11.  Disposition planning-in progress. Antonieta Pert, MD 07/12/2018, 12:05 PM

## 2018-07-12 NOTE — Progress Notes (Signed)
Patient ID: Miranda Berger, female   DOB: [birth year], 63 y.o.   MRN: [id number]  D: Patient pleasant on approach tonight. Some anxiety about her sleep medication. Took the seroquel but didn't want to take the Remeron unless she needed it. Talked with her about getting up and down slowly from the bed and chairs when starting a new medication to prevent dizziness and falls. No paranoid ideations noted when speaking with her. A: Staff will monitor on q 15 minute checks, follow treatment plan, and give medication as ordered. R: Cooperative on the unit.

## 2018-07-13 LAB — RPR: RPR Ser Ql: NONREACTIVE

## 2018-07-13 MED ORDER — SULFAMETHOXAZOLE-TRIMETHOPRIM 400-80 MG PO TABS
1.0000 | ORAL_TABLET | Freq: Two times a day (BID) | ORAL | Status: DC
  Administered 2018-07-13 – 2018-07-17 (×9): 1 via ORAL
  Filled 2018-07-13 (×14): qty 1

## 2018-07-13 MED ORDER — RISPERIDONE 1 MG PO TABS
1.0000 mg | ORAL_TABLET | Freq: Every day | ORAL | Status: DC
  Administered 2018-07-13 – 2018-07-14 (×2): 1 mg via ORAL
  Filled 2018-07-13 (×5): qty 1

## 2018-07-13 MED ORDER — RISPERIDONE 2 MG PO TABS
2.0000 mg | ORAL_TABLET | Freq: Every day | ORAL | Status: DC
  Administered 2018-07-13 – 2018-07-16 (×4): 2 mg via ORAL
  Filled 2018-07-13 (×6): qty 1

## 2018-07-13 MED ORDER — MELOXICAM 15 MG PO TABS
15.0000 mg | ORAL_TABLET | Freq: Every day | ORAL | Status: DC
  Administered 2018-07-14 – 2018-07-17 (×4): 15 mg via ORAL
  Filled 2018-07-13 (×6): qty 1
  Filled 2018-07-13: qty 2

## 2018-07-13 MED ORDER — ALPRAZOLAM 0.5 MG PO TABS
0.5000 mg | ORAL_TABLET | Freq: Three times a day (TID) | ORAL | Status: DC
  Administered 2018-07-13 – 2018-07-16 (×9): 0.5 mg via ORAL
  Filled 2018-07-13 (×9): qty 1

## 2018-07-13 NOTE — Plan of Care (Signed)
Patient is compliant with medications. Denies all SI HI AVH. Denies any physical pain.  Will continue to monitor q15 for safety checks,.  Problem: Education: Goal: Knowledge of [facility] Education information/materials will improve Outcome: Progressing Goal: Emotional status will improve Outcome: Progressing Goal: Mental status will improve Outcome: Progressing Goal: Verbalization of understanding the information provided will improve Outcome: Progressing

## 2018-07-13 NOTE — Progress Notes (Signed)
Patient rated his day as a 7 out of a possible 10. She explained that she had difficulty sleeping last evening and that she had an "up and down" day. She also stated that she realizes that she can not control certain aspects of her life.

## 2018-07-13 NOTE — Progress Notes (Signed)
[facility] MD Progress Note  07/13/2018 9:27 AM Miranda [facility]  MRN:  [id number] Subjective:  Patient is a 63 year old female with a longstanding past psychiatric history significant for major depression who presented to the [facility] as a walk-in patient on 07/10/2018. She came with her husband. She stated she did not know exactly what was wrong, but her son and his girlfriend had moved in with them in November, and they had a baby coming and thinks this got worse. The patient stated that she heard voices in the house. She thought it might be the girlfriend's voice from the room next door, but she looked in there and was not there. She also felt paranoid that everyone was against her.  Objective: Patient is seen and examined.  Patient is a 63 year old female with the above-stated past psychiatric history who is seen in follow-up.  She is not doing as well today as yesterday.  She stated that she is hearing voices again.  She stated that the voices are in her head.  She stated that she had been hearing voices for many years.  She stated that medicines for anxiety seem to decrease these.  She stated that after a while even on anxiety medicines the voices returned.  She denied feeling as sedated as she was yesterday.  She denied current suicidal ideation but stated that she felt as though her depression was worse today.  She is focused on having does take standing medications, and prefers not to.  She continues to complain of arthritic pain, and she did not feel as though the 7.5 mg of meloxicam was effective.  She stated that she was supposed to have appointment with her gynecologist with regard to her regular exam, and she was supposed to discuss with him the hematuria.  She does have a history of interstitial cystitis and pelvic floor pain in the past.  Her vital signs are stable, she is afebrile.  Her blood pressures a little low at 94/67.  She slept 6.25 hours last night.  Principal Problem: MDD  (major depressive disorder), recurrent severe, without psychosis (HCC) Diagnosis: Principal Problem:   MDD (major depressive disorder), recurrent severe, without psychosis (HCC) Active Problems:   Major depressive disorder, single episode, severe, with psychosis (HCC)  Total Time spent with patient: 20 minutes  Past Psychiatric History: See admission H&P  Past Medical History:  Past Medical History:  Diagnosis Date  . Anemia    in the past   . Anxiety   . Asthma   . Depression   . Family history of adverse reaction to anesthesia    mom had nausea  . GERD (gastroesophageal reflux disease)   . Hepatitis    Hepatitis A in 3rd grade?  [name] Hypertension   . PONV (postoperative nausea and vomiting)   . 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 Sleep apnea    uses CPAP, 12, starts at 6    Past Surgical History:  Procedure Laterality Date  . ABDOMINAL HYSTERECTOMY    . AORTIC VALVE REPLACEMENT N/A 07/26/2017   Procedure: MINIMALLY INVASIVE AORTIC VALVE REPLACEMENT (AVR);  Surgeon: Purcell Nails, MD;  Location: [facility] OR;  Service: Open Heart Surgery;  Laterality: N/A;  . BLADDER SURGERY  2011  . CARDIAC CATHETERIZATION    . CARPAL TUNNEL RELEASE  2012  . CHOLECYSTECTOMY    . RIGHT/LEFT HEART CATH AND CORONARY ANGIOGRAPHY N/A 07/08/2017  Procedure: RIGHT/LEFT HEART CATH AND CORONARY ANGIOGRAPHY;  Surgeon: Tonny Bollman, MD;  Location: [facility] INVASIVE CV LAB;  Service: Cardiovascular;  Laterality: N/A;  . TEE WITHOUT CARDIOVERSION N/A 07/26/2017   Procedure: TRANSESOPHAGEAL ECHOCARDIOGRAM (TEE);  Surgeon: Purcell Nails, MD;  Location: [facility] OR;  Service: Open Heart Surgery;  Laterality: N/A;  . TONSILLECTOMY     Family History:  Family History  Problem Relation Age of Onset  . Stroke Mother   . Leukemia Mother   . Stroke Father   . Sleep apnea Father   . Valvular heart disease Brother   . Heart Problems  Maternal Aunt   . Valvular heart disease Paternal Grandfather   . Valvular heart disease Maternal Aunt    Family Psychiatric  History: See admission H&P Social History:  Social History   Substance and Sexual Activity  Alcohol Use No  . Alcohol/week: 0.0 standard drinks     Social History   Substance and Sexual Activity  Drug Use No    Social History   Socioeconomic History  . Marital status: Married    Spouse name: Not on file  . Number of children: 1  . Years of education: BS  . Highest education level: Not on file  Occupational History  . Not on file  Social Needs  . Financial resource strain: Not on file  . Food insecurity:    Worry: Never true    Inability: Never true  . Transportation needs:    Medical: No    Non-medical: No  Tobacco Use  . Smoking status: Never Smoker  . Smokeless tobacco: Never Used  Substance and Sexual Activity  . Alcohol use: No    Alcohol/week: 0.0 standard drinks  . Drug use: No  . Sexual activity: Not on file  Lifestyle  . Physical activity:    Days per week: 0 days    Minutes per session: 0 min  . Stress: Rather much  Relationships  . Social connections:    Talks on phone: Not on file    Gets together: Not on file    Attends religious service: Not on file    Active member of club or organization: Not on file    Attends meetings of clubs or organizations: Not on file    Relationship status: Not on file  Other Topics Concern  . Not on file  Social History Narrative   Occasionally consumes tea or coffee   Additional Social History:    Pain Medications: See MARs Prescriptions: See MARs Over the Counter: See MARs History of alcohol / drug use?: Yes Longest period of sobriety (when/how long): "Months, I only drink socially with a friends" Negative Consequences of Use: (none) Withdrawal Symptoms: Other (Comment)(none) Name of Substance 1: Alcohol 1 - Age of First Use: unknown 1 - Amount (size/oz): "1 or 2 glasses of  wine" 1 - Frequency: "when on a cruise or out" 1 - Duration: ongoing 1 - Last Use / Amount: over 6 months ago                  Sleep: Fair  Appetite:  Fair  Current Medications: Current Facility-Administered Medications  Medication Dose Route Frequency Provider Last Rate Last Dose  . acebutolol (SECTRAL) capsule 200 mg  200 mg Oral Daily Charm Rings, NP   Stopped at 07/12/18 1148  . acetaminophen (TYLENOL) tablet 1,000 mg  1,000 mg Oral Q6H PRN Charm Rings, NP   1,000 mg at 07/12/18 0809  .  albuterol (VENTOLIN HFA) 108 (90 Base) MCG/ACT inhaler 2 puff  2 puff Inhalation Q6H PRN Charm Rings, NP      . ALPRAZolam Prudy Feeler) tablet 0.5 mg  0.5 mg Oral TID PRN Antonieta Pert, MD   0.5 mg at 07/12/18 2320  . alum & mag hydroxide-simeth (MAALOX/MYLANTA) 200-200-20 MG/5ML suspension 30 mL  30 mL Oral Q4H PRN Charm Rings, NP      . aspirin EC tablet 81 mg  81 mg Oral Daily Charm Rings, NP   81 mg at 07/13/18 0736  . buPROPion (WELLBUTRIN XL) 24 hr tablet 300 mg  300 mg Oral Daily Antonieta Pert, MD   300 mg at 07/13/18 0736  . calcium carbonate (OS-CAL - dosed in mg of elemental calcium) tablet 1,250 mg  1,250 mg Oral Q breakfast Charm Rings, NP   1,250 mg at 07/13/18 0737  . magnesium hydroxide (MILK OF MAGNESIA) suspension 30 mL  30 mL Oral Daily PRN Charm Rings, NP      . Melene Muller ON 07/14/2018] meloxicam (MOBIC) tablet 15 mg  15 mg Oral Daily Antonieta Pert, MD      . mirtazapine (REMERON) tablet 45 mg  45 mg Oral QHS Charm Rings, NP      . multivitamin with minerals tablet 1 tablet  1 tablet Oral Daily Charm Rings, NP   1 tablet at 07/13/18 0735  . pantoprazole (PROTONIX) EC tablet 40 mg  40 mg Oral Daily Charm Rings, NP   40 mg at 07/13/18 0734  . risperiDONE (RISPERDAL) tablet 1 mg  1 mg Oral Daily Antonieta Pert, MD   1 mg at 07/13/18 1610  . risperiDONE (RISPERDAL) tablet 2 mg  2 mg Oral QHS Jola Babinski Marlane Mingle, MD      .  sulfamethoxazole-trimethoprim (BACTRIM) 400-80 MG per tablet 1 tablet  1 tablet Oral Q12H Antonieta Pert, MD   1 tablet at 07/13/18 9604    Lab Results:  Results for orders placed or performed during the [facility] encounter of 07/11/18 (from the past 48 hour(s))  Urinalysis, Complete w Microscopic     Status: Abnormal   Collection Time: 07/11/18  5:38 PM  Result Value Ref Range   Color, Urine AMBER (A) YELLOW    Comment: BIOCHEMICALS MAY BE AFFECTED BY COLOR   APPearance HAZY (A) CLEAR   Specific Gravity, Urine 1.013 1.005 - 1.030   pH 6.0 5.0 - 8.0   Glucose, UA NEGATIVE NEGATIVE mg/dL   Hgb urine dipstick LARGE (A) NEGATIVE   Bilirubin Urine NEGATIVE NEGATIVE   Ketones, ur NEGATIVE NEGATIVE mg/dL   Protein, ur NEGATIVE NEGATIVE mg/dL   Nitrite NEGATIVE NEGATIVE   Leukocytes,Ua TRACE (A) NEGATIVE   RBC / HPF >50 (H) 0 - 5 RBC/hpf   WBC, UA 6-10 0 - 5 WBC/hpf   Bacteria, UA RARE (A) NONE SEEN   Squamous Epithelial / LPF 0-5 0 - 5   Mucus PRESENT     Comment: Performed at [facility], [address] [place], [place] [address]  TSH     Status: None   Collection Time: 07/11/18  6:19 PM  Result Value Ref Range   TSH 1.834 0.350 - 4.500 uIU/mL    Comment: Performed by a 3rd Generation assay with a functional sensitivity of <=0.01 uIU/mL. Performed at [facility], [address], [place], [place] [address]   Folate     Status: None  Collection Time: 07/11/18  6:19 PM  Result Value Ref Range   Folate 22.5 >5.9 ng/mL    Comment: Performed at [facility], [address] [place], [place] [address]  RPR     Status: None   Collection Time: 07/11/18  6:19 PM  Result Value Ref Range   RPR Ser Ql Non Reactive Non Reactive    Comment: (NOTE) Performed At: [facility] [address] [place], [place] [id number] Jolene Schimke MD [id number]   Urinalysis, Complete w Microscopic     Status: Abnormal   Collection  Time: 07/12/18  2:36 PM  Result Value Ref Range   Color, Urine YELLOW YELLOW   APPearance CLEAR CLEAR   Specific Gravity, Urine 1.010 1.005 - 1.030   pH 6.0 5.0 - 8.0   Glucose, UA NEGATIVE NEGATIVE mg/dL   Hgb urine dipstick MODERATE (A) NEGATIVE   Bilirubin Urine NEGATIVE NEGATIVE   Ketones, ur NEGATIVE NEGATIVE mg/dL   Protein, ur NEGATIVE NEGATIVE mg/dL   Nitrite NEGATIVE NEGATIVE   Leukocytes,Ua MODERATE (A) NEGATIVE   RBC / HPF 21-50 0 - 5 RBC/hpf   WBC, UA 11-20 0 - 5 WBC/hpf   Bacteria, UA NONE SEEN NONE SEEN   Squamous Epithelial / LPF 0-5 0 - 5    Comment: Performed at [facility], [address], [place], [place] [address]    Blood Alcohol level:  Lab Results  Component Value Date   ETH <10 07/10/2018    Metabolic Disorder Labs: Lab Results  Component Value Date   HGBA1C 5.5 07/22/2017   MPG 111.15 07/22/2017   No results found for: PROLACTIN No results found for: CHOL, TRIG, HDL, CHOLHDL, VLDL, LDLCALC  Physical Findings: AIMS: Facial and Oral Movements Muscles of Facial Expression: None, normal Lips and Perioral Area: None, normal Jaw: None, normal Tongue: None, normal,Extremity Movements Upper (arms, wrists, hands, fingers): None, normal Lower (legs, knees, ankles, toes): None, normal, Trunk Movements Neck, shoulders, hips: None, normal, Overall Severity Severity of abnormal movements (highest score from questions above): None, normal Incapacitation due to abnormal movements: None, normal Patient's awareness of abnormal movements (rate only patient's report): No Awareness, Dental Status Current problems with teeth and/or dentures?: No Does patient usually wear dentures?: No  CIWA:  CIWA-Ar Total: 5 COWS:  COWS Total Score: 5  Musculoskeletal: Strength & Muscle Tone: within normal limits Gait & Station: normal Patient leans: N/A  Psychiatric Specialty Exam: Physical Exam  Nursing note and vitals reviewed. Constitutional: She  is oriented to person, place, and time. She appears well-developed and well-nourished.  HENT:  Head: Normocephalic and atraumatic.  Respiratory: Effort normal.  Neurological: She is alert and oriented to person, place, and time.    ROS  Blood pressure 94/67, pulse 96, temperature 97.8 F (36.6 C), temperature source Oral, resp. rate 18, height  (1.6 m), weight 75.8 kg, SpO2 98 %.Body mass index is 29.58 kg/m.  General Appearance: Casual  Eye Contact:  Fair  Speech:  Normal Rate  Volume:  Decreased  Mood:  Anxious and Depressed  Affect:  Congruent  Thought Process:  Coherent and Descriptions of Associations: Loose  Orientation:  Full (Time, Place, and Person)  Thought Content:  Delusions and Hallucinations: Auditory  Suicidal Thoughts:  No  Homicidal Thoughts:  No  Memory:  Immediate;   Fair Recent;   Fair Remote;   Fair  Judgement:  Intact  Insight:  Fair  Psychomotor Activity:  Increased  Concentration:  Concentration: Fair  and Attention Span: Fair  Recall:  [employer]Fair  Fund of Knowledge:  Fair  Language:  Fair  Akathisia:  Negative  Handed:  Right  AIMS (if indicated):     Assets:  Desire for Improvement Resilience  ADL's:  Intact  Cognition:  WNL  Sleep:  Number of Hours: 6.25     Treatment Plan Summary: Daily contact with patient to assess and evaluate symptoms and progress in treatment, Medication management and Plan : Patient is seen and examined.  Patient is a 63 year old female with the above-stated past psychiatric history who is seen in follow-up.   Diagnosis: #1 major depression, recurrent, severe with psychotic features, #2 generalized anxiety disorder, #3 musculoskeletal pain, #4 hematuria, #5 previous aortic valve replacement, #6 history of interstitial cystitis, #7 history of pelvic floor pain  Patient is not doing as well today as yesterday.  She has had a return of her auditory hallucinations, she also is a bit more paranoid.  She remains ruminating on  having to take scheduled medications as well as her issues at home.  Her Wellbutrin XL was increased to 300 mg p.o. daily yesterday, but the Seroquel does not appear to be helping her psychotic symptoms on a regular basis.  I am going to stop the Seroquel today, and we will switch to Risperdal 1 mg p.o. daily and 2 mg p.o. nightly.  Hopefully that will be of benefit.  She continues to complain of low back pain, and I am going to increase her meloxicam to 15 mg p.o. daily.  Again, hopefully this will be effective in helping her pain.  Her RPR did come back and it was nonreactive.  Her repeat urinalysis from yesterday continues to show moderate hemoglobin, moderate leukocyte esterase positive but negative on bacteria.  Levan to 20 white blood cells.  I will write for Bactrim DS 1 tab p.o. twice daily for the possible urinary tract infection.  We will continue her Xanax at 0.5 mg p.o. 3 times daily.  Hopefully some of these issues will begin to improve. 1.  Continue acebutolol 200 mg p.o. daily for hypertension and heart rate control. 2.  Continue albuterol HFA 2 puffs inhaled every 6 hours as needed wheezing. 3.  Continue Xanax 0.5 mg p.o. 3 times daily but change to standing 4.  Continue coated aspirin 81 mg p.o. daily for heart health. 5.  Continue Wellbutrin XL to 300 mg p.o. daily for depression. 6.  Increase meloxicam to 15 mg p.o. daily for musculoskeletal pain. 7.  Continue mirtazapine 45 mg p.o. nightly for insomnia as well as depression and anxiety. 8.  Stop Seroquel . 9.    Start Risperdal 1 mg p.o. daily and 2 mg p.o. nightly for psychotic symptoms as well as mood stabilization.. 10.  Continue Protonix 40 mg p.o. daily for reflux. 11.    Start Bactrim DS 1 tablet p.o. twice daily for urinary tract infection. 12.  Disposition planning-in progress. Antonieta PertGreg Lawson Clary, MD 07/13/2018, 9:27 AM

## 2018-07-14 LAB — METHYLMALONIC ACID, SERUM: Methylmalonic Acid, Quantitative: 180 nmol/L (ref 0–378)

## 2018-07-14 MED ORDER — RISPERIDONE 0.5 MG PO TABS
0.5000 mg | ORAL_TABLET | Freq: Every day | ORAL | Status: DC
  Administered 2018-07-15 – 2018-07-17 (×3): 0.5 mg via ORAL
  Filled 2018-07-14 (×5): qty 1

## 2018-07-14 MED ORDER — FLUTICASONE PROPIONATE 50 MCG/ACT NA SUSP
2.0000 | Freq: Every day | NASAL | Status: DC
  Administered 2018-07-14 – 2018-07-15 (×3): 2 via NASAL
  Filled 2018-07-14 (×2): qty 16

## 2018-07-14 NOTE — Progress Notes (Signed)
D   Pt is pleasant on approach and cooperative   She reports having a good day and said she is trying to remain positive in what she does and says    Her behavior is appropriate and she interacts well with others  A   Verbal support given    Medications administered and effectiveness monitored   Q 15 min checks R   Pt remains safe at this time  [place] NOVEL CORONAVIRUS (COVID-19) DAILY CHECK-OFF SYMPTOMS - answer yes or no to each - every day NO YES  Have you had a fever in the past 24 hours?  . Fever (Temp > 37.80C / 100F) X   Have you had any of these symptoms in the past 24 hours? . New Cough .  Sore Throat  .  Shortness of Breath .  Difficulty Breathing .  Unexplained Body Aches   X   Have you had any one of these symptoms in the past 24 hours not related to allergies?   . Runny Nose .  Nasal Congestion .  Sneezing   X   If you have had runny nose, nasal congestion, sneezing in the past 24 hours, has it worsened?  X   EXPOSURES - check yes or no X   Have you traveled outside the state in the past 14 days?  X   Have you been in contact with someone with a confirmed diagnosis of COVID-19 or PUI in the past 14 days without wearing appropriate PPE?  X   Have you been living in the same home as a person with confirmed diagnosis of COVID-19 or a PUI (household contact)?    X   Have you been diagnosed with COVID-19?    X              What to do next: Answered NO to all: Answered YES to anything:   Proceed with unit schedule Follow the [facility] Inpatient Flowsheet.

## 2018-07-14 NOTE — Progress Notes (Signed)
[facility] MD Progress Note  07/14/2018 10:43 AM Miranda Berger  MRN:  [id number] Subjective:  Patient is a 63 year old female with a longstanding past psychiatric history significant for major depression who presented to the behavioral health hospital as a walk-in patient on 07/10/2018. She came with her husband. She stated she did not know exactly what was wrong, but her son and his girlfriend had moved in with them in November, and they had a baby coming and thinks this got worse. The patient stated that she heard voices in the house. She thought it might be the girlfriend's voice from the room next door, but she looked in there and was not there. She also felt paranoid that everyone was against her.  Objective: Patient is seen and examined.  Patient is a 63 year old female with the above-stated past psychiatric history who is seen in follow-up.  She stated that the voices are better today.  She is mildly oversedated from the Risperdal at 1 mg daily and 2 nightly.  We discussed decreasing the daytime to half a milligram.  She did state that she slept well last night.  She complained of a stuffy nose, and wants to have a pneumonia shot as well as recheck of her COVID-19.  I told her she was not having any fevers, and she would not need the COVID-19 test.  I told her we would give her some Flonase for her stuffy nose.  She did not complain of any back pain this morning.  She did not state that she was suicidal.  She stated she has spoken to her husband, and he is willing to compromise on some of her issues.  It does sound as though she has had some excessive spending in the past with relation to whether she has schizoaffective disorder versus bipolar disorder.  She remains on Wellbutrin XL, mirtazapine, and Risperdal.  She also continues on Bactrim for urinary tract infection.  Her vital signs are stable, she is afebrile.  She was mildly tachycardic this morning.  She slept 6.25 hours last night.  Principal  Problem: MDD (major depressive disorder), recurrent severe, without psychosis (HCC) Diagnosis: Principal Problem:   MDD (major depressive disorder), recurrent severe, without psychosis (HCC) Active Problems:   Major depressive disorder, single episode, severe, with psychosis (HCC)  Total Time spent with patient: 20 minutes  Past Psychiatric History: See admission H&P  Past Medical History:  Past Medical History:  Diagnosis Date  . Anemia    in the past   . Anxiety   . Asthma   . Depression   . Family history of adverse reaction to anesthesia    mom had nausea  . GERD (gastroesophageal reflux disease)   . Hepatitis    Hepatitis A in 3rd grade?  [name] Hypertension   . PONV (postoperative nausea and vomiting)   . 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 Sleep apnea    uses CPAP, 12, starts at 6    Past Surgical History:  Procedure Laterality Date  . ABDOMINAL HYSTERECTOMY    . AORTIC VALVE REPLACEMENT N/A 07/26/2017   Procedure: MINIMALLY INVASIVE AORTIC VALVE REPLACEMENT (AVR);  Surgeon: Purcell Nails, MD;  Location: [facility] OR;  Service: Open Heart Surgery;  Laterality: N/A;  . BLADDER SURGERY  2011  . CARDIAC CATHETERIZATION    . CARPAL TUNNEL RELEASE  2012  . CHOLECYSTECTOMY    . RIGHT/LEFT HEART CATH AND CORONARY  ANGIOGRAPHY N/A 07/08/2017   Procedure: RIGHT/LEFT HEART CATH AND CORONARY ANGIOGRAPHY;  Surgeon: Tonny Bollmanooper, Michael, MD;  [facility]VAS[facility] CV LAB;  Service: Cardiovascular;  Laterality: N/A;  . TEE WITHOUT CARDIOVERSION N/A 07/26/2017   Procedure: TRANSESOPHAGEAL ECHOCARDIOGRAM (TEE);  Surgeon: Purcell Nailswen, Clarence H, MD;  [facility];  Service: Open Heart Surgery;  Laterality: N/A;  . TONSILLECTOMY     Family History:  Family History  Problem Relation Age of Onset  . Stroke Mother   . Leukemia Mother   . Stroke Father   . Sleep apnea Father   . Valvular heart disease Brother   . Heart  Problems Maternal Aunt   . Valvular heart disease Paternal Grandfather   . Valvular heart disease Maternal Aunt    Family Psychiatric  History: See admission H&P Social History:  Social History   Substance and Sexual Activity  Alcohol Use No  . Alcohol/week: 0.0 standard drinks     Social History   Substance and Sexual Activity  Drug Use No    Social History   Socioeconomic History  . Marital status: Married    Spouse name: Not on file  . Number of children: 1  . Years of education: BS  . Highest education level: Not on file  Occupational History  . Not on file  Social Needs  . Financial resource strain: Not on file  . Food insecurity:    Worry: Never true    Inability: Never true  . Transportation needs:    Medical: No    Non-medical: No  Tobacco Use  . Smoking status: Never Smoker  . Smokeless tobacco: Never Used  Substance and Sexual Activity  . Alcohol use: No    Alcohol/week: 0.0 standard drinks  . Drug use: No  . Sexual activity: Not on file  Lifestyle  . Physical activity:    Days per week: 0 days    Minutes per session: 0 min  . Stress: Rather much  Relationships  . Social connections:    Talks on phone: Not on file    Gets together: Not on file    Attends religious service: Not on file    Active member of club or organization: Not on file    Attends meetings of clubs or organizations: Not on file    Relationship status: Not on file  Other Topics Concern  . Not on file  Social History Narrative   Occasionally consumes tea or coffee   Additional Social History:    Pain Medications: See MARs Prescriptions: See MARs Over the Counter: See MARs History of alcohol / drug use?: Yes Longest period of sobriety (when/how long): "Months, I only drink socially with a friends" Negative Consequences of Use: (none) Withdrawal Symptoms: Other (Comment)(none) Name of Substance 1: Alcohol 1 - Age of First Use: unknown 1 - Amount (size/oz): "1 or 2 glasses  of wine" 1 - Frequency: "when on a cruise or out" 1 - Duration: ongoing 1 - Last Use / Amount: over 6 months ago                  Sleep: Good  Appetite:  Fair  Current Medications: Current Facility-Administered Medications  Medication Dose Route Frequency Provider Last Rate Last Dose  . acebutolol (SECTRAL) capsule 200 mg  200 mg Oral Daily Charm RingsLord, Jamison Y, NP   200 mg at 07/14/18 16100812  . acetaminophen (TYLENOL) tablet 1,000 mg  1,000 mg Oral Q6H PRN Charm RingsLord, Jamison Y, NP   1,000  mg at 07/12/18 0809  . albuterol (VENTOLIN HFA) 108 (90 Base) MCG/ACT inhaler 2 puff  2 puff Inhalation Q6H PRN Charm Rings, NP      . ALPRAZolam Prudy Feeler) tablet 0.5 mg  0.5 mg Oral TID Antonieta Pert, MD   0.5 mg at 07/14/18 0813  . alum & mag hydroxide-simeth (MAALOX/MYLANTA) 200-200-20 MG/5ML suspension 30 mL  30 mL Oral Q4H PRN Charm Rings, NP      . aspirin EC tablet 81 mg  81 mg Oral Daily Charm Rings, NP   81 mg at 07/14/18 6045  . buPROPion (WELLBUTRIN XL) 24 hr tablet 300 mg  300 mg Oral Daily Antonieta Pert, MD   300 mg at 07/14/18 4098  . calcium carbonate (OS-CAL - dosed in mg of elemental calcium) tablet 1,250 mg  1,250 mg Oral Q breakfast Charm Rings, NP   1,250 mg at 07/14/18 0813  . fluticasone (FLONASE) 50 MCG/ACT nasal spray 2 spray  2 spray Each Nare Daily Antonieta Pert, MD      . magnesium hydroxide (MILK OF MAGNESIA) suspension 30 mL  30 mL Oral Daily PRN Charm Rings, NP      . meloxicam (MOBIC) tablet 15 mg  15 mg Oral Daily Antonieta Pert, MD   15 mg at 07/14/18 0813  . mirtazapine (REMERON) tablet 45 mg  45 mg Oral QHS Charm Rings, NP   45 mg at 07/13/18 2107  . multivitamin with minerals tablet 1 tablet  1 tablet Oral Daily Charm Rings, NP   1 tablet at 07/14/18 0813  . pantoprazole (PROTONIX) EC tablet 40 mg  40 mg Oral Daily Charm Rings, NP   40 mg at 07/14/18 1191  . [START ON 07/15/2018] risperiDONE (RISPERDAL) tablet 0.5 mg  0.5 mg  Oral Daily Antonieta Pert, MD      . risperiDONE (RISPERDAL) tablet 2 mg  2 mg Oral QHS Antonieta Pert, MD   2 mg at 07/13/18 2107  . sulfamethoxazole-trimethoprim (BACTRIM) 400-80 MG per tablet 1 tablet  1 tablet Oral Q12H Antonieta Pert, MD   1 tablet at 07/14/18 0813    Lab Results:  Results for orders placed or performed during the hospital encounter of 07/11/18 (from the past 48 hour(s))  Urinalysis, Complete w Microscopic     Status: Abnormal   Collection Time: 07/12/18  2:36 PM  Result Value Ref Range   Color, Urine YELLOW YELLOW   APPearance CLEAR CLEAR   Specific Gravity, Urine 1.010 1.005 - 1.030   pH 6.0 5.0 - 8.0   Glucose, UA NEGATIVE NEGATIVE mg/dL   Hgb urine dipstick MODERATE (A) NEGATIVE   Bilirubin Urine NEGATIVE NEGATIVE   Ketones, ur NEGATIVE NEGATIVE mg/dL   Protein, ur NEGATIVE NEGATIVE mg/dL   Nitrite NEGATIVE NEGATIVE   Leukocytes,Ua MODERATE (A) NEGATIVE   RBC / HPF 21-50 0 - 5 RBC/hpf   WBC, UA 11-20 0 - 5 WBC/hpf   Bacteria, UA NONE SEEN NONE SEEN   Squamous Epithelial / LPF 0-5 0 - 5    Comment: Performed at [facility], [address], [place], [place] [address]    Blood Alcohol level:  Lab Results  Component Value Date   ETH <10 07/10/2018    Metabolic Disorder Labs: Lab Results  Component Value Date   HGBA1C 5.5 07/22/2017   MPG 111.15 07/22/2017   No results found for: PROLACTIN No results found for:  CHOL, TRIG, HDL, CHOLHDL, VLDL, LDLCALC  Physical Findings: AIMS: Facial and Oral Movements Muscles of Facial Expression: None, normal Lips and Perioral Area: None, normal Jaw: None, normal Tongue: None, normal,Extremity Movements Upper (arms, wrists, hands, fingers): None, normal Lower (legs, knees, ankles, toes): None, normal, Trunk Movements Neck, shoulders, hips: None, normal, Overall Severity Severity of abnormal movements (highest score from questions above): None, normal Incapacitation due to  abnormal movements: None, normal Patient's awareness of abnormal movements (rate only patient's report): No Awareness, Dental Status Current problems with teeth and/or dentures?: No Does patient usually wear dentures?: No  CIWA:  CIWA-Ar Total: 5 COWS:  COWS Total Score: 5  Musculoskeletal: Strength & Muscle Tone: within normal limits Gait & Station: normal Patient leans: N/A  Psychiatric Specialty Exam: Physical Exam  Nursing note and vitals reviewed. Constitutional: She is oriented to person, place, and time. She appears well-developed and well-nourished.  HENT:  Head: Normocephalic and atraumatic.  Respiratory: Effort normal.  Neurological: She is alert and oriented to person, place, and time.    ROS  Blood pressure 114/82, pulse (!) 114, temperature 98.8 F (37.1 C), temperature source Oral, resp. rate 18, height  (1.6 m), weight 75.8 kg, SpO2 98 %.Body mass index is 29.58 kg/m.  General Appearance: Casual  Eye Contact:  Fair  Speech:  Normal Rate  Volume:  Decreased  Mood:  Sedated  Affect:  Congruent  Thought Process:  Coherent and Descriptions of Associations: Intact  Orientation:  Full (Time, Place, and Person)  Thought Content:  Delusions and Hallucinations: Auditory  Suicidal Thoughts:  No  Homicidal Thoughts:  No  Memory:  Immediate;   Fair Recent;   Fair Remote;   Fair  Judgement:  Intact  Insight:  Fair  Psychomotor Activity:  Psychomotor Retardation  Concentration:  Concentration: Fair and Attention Span: Fair  Recall:  [employer] of Knowledge:  Fair  Language:  Fair  Akathisia:  Negative  Handed:  Right  AIMS (if indicated):     Assets:  Desire for Improvement Resilience  ADL's:  Intact  Cognition:  WNL  Sleep:  Number of Hours: 6.25     Treatment Plan Summary: Daily contact with patient to assess and evaluate symptoms and progress in treatment, Medication management and Plan : Patient is seen and examined.  Patient is a 63 year old female  with the above-stated past psychiatric history who is seen in follow-up.   Diagnosis:#1 major depression, recurrent, severe with psychotic features, #2 generalized anxiety disorder, #3 musculoskeletal pain, #4 hematuria, #5 previous aortic valve replacement, #6 history of interstitial cystitis, #7 history of pelvic floor pain  Patient is better today with regard to her psychotic symptoms.  She is a bit oversedated.  I am going to reduce her Risperdal to 0.5 mg p.o. daily but continue the 2 mg p.o. nightly.  She did continue to have somatic complaints, and was requesting the COVID-19 test again.  I told her she was afebrile and I did not see a need for that at this point.  I will add Flonase for her stuffy nose.  No other changes in her medications today.  Hopefully she will continue to improve through the weekend and we will be able to get her home.  1. Continue acebutolol 200 mg p.o. daily for hypertension and heart rate control. 2. Continue albuterol HFA 2 puffs inhaled every 6 hours as needed wheezing. 3. Continue Xanax 0.5 mg p.o. 3 times daily but change to standing 4. Continue coated  aspirin 81 mg p.o. daily for heart health. 5. Continue Wellbutrin XL to 300 mg p.o. daily for depression. 6. Continue meloxicam to 15 mg p.o. daily for musculoskeletal pain. 7. Continue mirtazapine 45 mg p.o. nightly for insomnia as well as depression and anxiety. 8. Decrease Risperdal to 0.5 mg p.o. daily and 2 mg p.o. nightly for psychotic symptoms as well as mood stabilization.. 9. Continue Protonix 40 mg p.o. daily for reflux. 10. Continue Bactrim DS 1 tablet p.o. twice daily for urinary tract infection. 11.  Flonase 2 sprays in each nostril daily for nasal congestion. 12.  Disposition planning-in progress.  Antonieta Pert, MD 07/14/2018, 10:43 AM

## 2018-07-14 NOTE — Progress Notes (Signed)
   07/14/18 0318  COVID-19 Daily Checkoff  Have you had a fever (temp > 37.80C/100F)  in the past 24 hours?  No  COVID-19 EXPOSURE  Have you traveled outside the state in the past 14 days? No  Have you been in contact with someone with a confirmed diagnosis of COVID-19 or PUI in the past 14 days without wearing appropriate PPE? No  Have you been living in the same home as a person with confirmed diagnosis of COVID-19 or a PUI (household contact)? No  Have you been diagnosed with COVID-19? No

## 2018-07-14 NOTE — Progress Notes (Signed)
D: Pt was at nurse's station upon initial approach.  Pt presents with depressed affect and mood.  She brightens with interaction and has been seen smiling on occasion tonight.  Pt describes her day as "pretty good."  Her goal is "just getting myself back to where I need to be."  Pt denies SI/HI, denies hallucinations, denies pain.  Pt has been visible in milieu interacting with peers and staff appropriately.      A: Introduced self to pt.  Met with pt 1:1.  Actively listened to pt and offered support and encouragement.  Medications administered per order.  Fall prevention techniques reviewed with pt and she verbalized understanding.  Q15 minute safety checks in place.  R: Pt is safe on the unit.  Pt is compliant with medications.  Pt verbally contracts for safety.  Will continue to monitor and assess.

## 2018-07-14 NOTE — Progress Notes (Signed)
[place] NOVEL CORONAVIRUS (COVID-19) DAILY CHECK-OFF SYMPTOMS - answer yes or no to each - every day NO YES  Have you had a fever in the past 24 hours?  . Fever (Temp > 37.80C / 100F) X   Have you had any of these symptoms in the past 24 hours? . New Cough .  Sore Throat  .  Shortness of Breath .  Difficulty Breathing .  Unexplained Body Aches   X   Have you had any one of these symptoms in the past 24 hours not related to allergies?   . Runny Nose .  Nasal Congestion .  Sneezing   X   If you have had runny nose, nasal congestion, sneezing in the past 24 hours, has it worsened?  X   EXPOSURES - check yes or no X   Have you traveled outside the state in the past 14 days?  X   Have you been in contact with someone with a confirmed diagnosis of COVID-19 or PUI in the past 14 days without wearing appropriate PPE?  X   Have you been living in the same home as a person with confirmed diagnosis of COVID-19 or a PUI (household contact)?    X   Have you been diagnosed with COVID-19?    X              What to do next: Answered NO to all: Answered YES to anything:   Proceed with unit schedule Follow the [facility] Inpatient Flowsheet.   

## 2018-07-14 NOTE — Progress Notes (Signed)
Nursing Note: 0700-1900  D:  Pt presents with depressed mood and sad but pleasant affect.  States that she is having difficulty focusing today and rates that depression feels 7/10.  Both hopelessness and anxiety feel 8/10 today.  States that she has intermittent negative thoughts and concerns but feels that she is working through these issues.   A:  Encouraged to verbalize needs and concerns, active listening and support provided.  Continued Q 15 minute safety checks.  Observed active participation in group settings.  R:  Pt. is calm and cooperative.  Denies A/V hallucinations and is able to verbally contract for safety.

## 2018-07-15 MED ORDER — FLUTICASONE PROPIONATE 50 MCG/ACT NA SUSP
2.0000 | Freq: Every day | NASAL | Status: DC
  Administered 2018-07-16: 2 via NASAL
  Filled 2018-07-15: qty 16

## 2018-07-15 MED ORDER — POLYETHYLENE GLYCOL 3350 17 G PO PACK
17.0000 g | PACK | Freq: Every day | ORAL | Status: DC
  Administered 2018-07-15 – 2018-07-17 (×3): 17 g via ORAL
  Filled 2018-07-15 (×6): qty 1

## 2018-07-15 NOTE — Progress Notes (Signed)
Nursing Progress Note: 7-7p  D- Mood is depressed and anxious,continues to have racing thoughts but states there less. Pt states she's feeling much better and medication has helped. " I feel like I'm ready to go home. But I'm not feeling great about my son living in the house but sometimes you have to do what you can for your family".Affect is blunted and appropriate. Pt is able to contract for safety.Sleep is fair   A - Observed pt minimally interacting in group and in the milieu.Support and encouragement offered, safety maintained with q 15 minutes. Pt given miralax for her contipation  R-Contracts for safety and continues to follow treatment plan, working on learning new coping skills.

## 2018-07-15 NOTE — Progress Notes (Signed)
D.  Pt pleasant on approach, appears anxious.  Pt denies dizziness presently so held dose of xanax given.  Pt observed appropriately engaged with peers on the unit.  Pt denies SI/HI/AVH at this time.  A.  Support and encouragement offered, medication given as ordered  R. Pt remains safe on the unit, will continue to monitor.

## 2018-07-15 NOTE — Progress Notes (Signed)
Adult Psychoeducational Group Note  Date:  07/15/2018 Time:  9:26 PM  Group Topic/Focus:  Wrap-Up Group:   The focus of this group is to help patients review their daily goal of treatment and discuss progress on daily workbooks.  Participation Level:  Active  Participation Quality:  Appropriate, Attentive and Sharing  Affect:  Anxious and Appropriate  Cognitive:  Alert, Appropriate and Oriented  Insight: Appropriate  Engagement in Group:  Engaged  Modes of Intervention:  Discussion and Support  Additional Comments:  Today pt enjoyed going outside. She enjoyed walking and the sunshine. Pt enjoyed group with everyone today.   Glorious Peach 07/15/2018, 9:26 PM

## 2018-07-15 NOTE — Progress Notes (Signed)
Info note : Pt c/o feeling weak and dizzy," I'm feeling overmedicated." Pt given a cool cloth aplaced in bed.

## 2018-07-15 NOTE — Progress Notes (Signed)
[place] NOVEL CORONAVIRUS (COVID-19) DAILY CHECK-OFF SYMPTOMS - answer yes or no to each - every day NO YES  Have you had a fever in the past 24 hours?  . Fever (Temp > 37.80C / 100F) X   Have you had any of these symptoms in the past 24 hours? . New Cough .  Sore Throat  .  Shortness of Breath .  Difficulty Breathing .  Unexplained Body Aches   X   Have you had any one of these symptoms in the past 24 hours not related to allergies?   . Runny Nose .  Nasal Congestion .  Sneezing   X   If you have had runny nose, nasal congestion, sneezing in the past 24 hours, has it worsened?  X   EXPOSURES - check yes or no X   Have you traveled outside the state in the past 14 days?  X   Have you been in contact with someone with a confirmed diagnosis of COVID-19 or PUI in the past 14 days without wearing appropriate PPE?  X   Have you been living in the same home as a person with confirmed diagnosis of COVID-19 or a PUI (household contact)?    X   Have you been diagnosed with COVID-19?    X              What to do next: Answered NO to all: Answered YES to anything:   Proceed with unit schedule Follow the [facility] Inpatient Flowsheet.   

## 2018-07-15 NOTE — [facility] Group Notes (Signed)
LCSW Group Therapy Note  07/15/2018    10:00-11:00am   Type of Therapy and Topic:  Group Therapy: Anger and Coping Skills  Participation Level:  Minimal   Description of Group:   In this group, patients learned how to recognize the physical, cognitive, emotional, and behavioral responses they have to anger-provoking situations.  They identified how they usually or often react when angered, and learned how healthy and unhealthy coping skills work initially, but the unhealthy ones stop working.   They analyzed how their frequently-chosen coping skill is possibly beneficial and how it is possibly unhelpful.  The group discussed a variety of healthier coping skills that could help in resolving the actual issues, as well as how to go about planning for the the possibility of future similar situations.  Therapeutic Goals: 1. Patients will identify one thing that makes them angry and how they feel emotionally and physically, what their thoughts are or tend to be in those situations, and what healthy or unhealthy coping mechanism they typically use 2. Patients will identify how their coping technique works for them, as well as how it works against them. 3. Patients will explore possible new behaviors to use in future anger situations. 4. Patients will learn that anger itself is normal and cannot be eliminated, and that healthier coping skills can assist with resolving conflict rather than worsening situations.  Summary of Patient Progress:  The patient was late to group but once she arrived she did participate.  Therapeutic Modalities:   Cognitive Behavioral Therapy Motivation Interviewing  Lynnell Chad  .

## 2018-07-15 NOTE — Progress Notes (Signed)
[facility]  07/15/2018 10:39 AM Assunta GamblesRita S Asbill  MRN:  [id number]   [place] [name] observed sitting on bed doing a crossword puzzle.  She reports mild frustration after speaking to her husband on this morning.  Stated she continues to experience racing thoughts.  Reports medication has been helping to easy her "mind and thoughts" reports her anxiety has improved since her admission.  Continues to report some paranoia.  Reports taking and tolerating medications well.does endorse intermittent auditory hallucination. reports good appetite.  States she is resting well throughout the night.  Denies suicidal or homicidal ideations.  Patient reported mild constipation will initiate MiraLAX. support encouragement reassurance was provided.  Principal Problem: MDD (major depressive disorder), recurrent severe, without psychosis (HCC) Diagnosis: Principal Problem:   MDD (major depressive disorder), recurrent severe, without psychosis (HCC) Active Problems:   Major depressive disorder, single episode, severe, with psychosis (HCC)  Total Time spent with patient: 20 minutes  Past Psychiatric History: See admission H&P  Past Medical History:  Past Medical History:  Diagnosis Date  . Anemia    in the past   . Anxiety   . Asthma   . Depression   . Family history of adverse reaction to anesthesia    mom had nausea  . GERD (gastroesophageal reflux disease)   . Hepatitis    Hepatitis A in 3rd grade?  [name]. Hypertension   . PONV (postoperative nausea and vomiting)   . 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 Sleep apnea    uses CPAP, 12, starts at 6    Past Surgical History:  Procedure Laterality Date  . ABDOMINAL HYSTERECTOMY    . AORTIC VALVE REPLACEMENT N/A 07/26/2017   Procedure: MINIMALLY INVASIVE AORTIC VALVE REPLACEMENT (AVR);  Surgeon: Purcell Nailswen, Clarence H, MD;  [facility];  Service: Open Heart Surgery;  Laterality: N/A;   . BLADDER SURGERY  2011  . CARDIAC CATHETERIZATION    . CARPAL TUNNEL RELEASE  2012  . CHOLECYSTECTOMY    . RIGHT/LEFT HEART CATH AND CORONARY ANGIOGRAPHY N/A 07/08/2017   Procedure: RIGHT/LEFT HEART CATH AND CORONARY ANGIOGRAPHY;  Surgeon: Tonny Bollmanooper, Michael, MD;  [facility];  Service: Cardiovascular;  Laterality: N/A;  . TEE WITHOUT CARDIOVERSION N/A 07/26/2017   Procedure: TRANSESOPHAGEAL ECHOCARDIOGRAM (TEE);  Surgeon: Purcell Nailswen, Clarence H, MD;  [facility];  Service: Open Heart Surgery;  Laterality: N/A;  . TONSILLECTOMY     Family History:  Family History  Problem Relation Age of Onset  . Stroke Mother   . Leukemia Mother   . Stroke Father   . Sleep apnea Father   . Valvular heart disease Brother   . Heart Problems Maternal Aunt   . Valvular heart disease Paternal Grandfather   . Valvular heart disease Maternal Aunt    Family Psychiatric  History: See admission H&P Social History:  Social History   Substance and Sexual Activity  Alcohol Use No  . Alcohol/week: 0.0 standard drinks     Social History   Substance and Sexual Activity  Drug Use No    Social History   Socioeconomic History  . Marital status: Married    Spouse name: Not on file  . Number of children: 1  . Years of education: BS  . Highest education level: Not on file  Occupational History  . Not on file  Social Needs  . Financial resource strain: Not on file  . Food  insecurity:    Worry: Never true    Inability: Never true  . Transportation needs:    Medical: No    Non-medical: No  Tobacco Use  . Smoking status: Never Smoker  . Smokeless tobacco: Never Used  Substance and Sexual Activity  . Alcohol use: No    Alcohol/week: 0.0 standard drinks  . Drug use: No  . Sexual activity: Not on file  Lifestyle  . Physical activity:    Days per week: 0 days    Minutes per session: 0 min  . Stress: Rather much  Relationships  . Social connections:    Talks on phone: Not on file     Gets together: Not on file    Attends religious service: Not on file    Active member of club or organization: Not on file    Attends meetings of clubs or organizations: Not on file    Relationship status: Not on file  Other Topics Concern  . Not on file  Social History Narrative   Occasionally consumes tea or coffee   Additional Social History:    Pain Medications: See MARs Prescriptions: See MARs Over the Counter: See MARs History of alcohol / drug use?: Yes Longest period of sobriety (when/how long): "Months, I only drink socially with a friends" Negative Consequences of Use: (none) Withdrawal Symptoms: Other (Comment)(none) Name of Substance 1: Alcohol 1 - Age of First Use: unknown 1 - Amount (size/oz): "1 or 2 glasses of wine" 1 - Frequency: "when on a cruise or out" 1 - Duration: ongoing 1 - Last Use / Amount: over 6 months ago                  Sleep: Good  Appetite:  Fair  Current Medications: Current Facility-Administered Medications  Medication Dose Route Frequency Provider Last Rate Last Dose  . acebutolol (SECTRAL) capsule 200 mg  200 mg Oral Daily Charm Rings, NP   200 mg at 07/15/18 0744  . acetaminophen (TYLENOL) tablet 1,000 mg  1,000 mg Oral Q6H PRN Charm Rings, NP   1,000 mg at 07/12/18 0809  . albuterol (VENTOLIN HFA) 108 (90 Base) MCG/ACT inhaler 2 puff  2 puff Inhalation Q6H PRN Charm Rings, NP      . ALPRAZolam Prudy Feeler) tablet 0.5 mg  0.5 mg Oral TID Antonieta Pert, MD   0.5 mg at 07/15/18 0743  . alum & mag hydroxide-simeth (MAALOX/MYLANTA) 200-200-20 MG/5ML suspension 30 mL  30 mL Oral Q4H PRN Charm Rings, NP      . aspirin EC tablet 81 mg  81 mg Oral Daily Charm Rings, NP   81 mg at 07/15/18 0743  . buPROPion (WELLBUTRIN XL) 24 hr tablet 300 mg  300 mg Oral Daily Antonieta Pert, MD   300 mg at 07/15/18 0745  . calcium carbonate (OS-CAL - dosed in mg of elemental calcium) tablet 1,250 mg  1,250 mg Oral Q breakfast Charm Rings, NP   1,250 mg at 07/15/18 0744  . fluticasone (FLONASE) 50 MCG/ACT nasal spray 2 spray  2 spray Each Nare Daily Antonieta Pert, MD   2 spray at 07/15/18 [phone]  . magnesium hydroxide (MILK OF MAGNESIA) suspension 30 mL  30 mL Oral Daily PRN Charm Rings, NP      . meloxicam (MOBIC) tablet 15 mg  15 mg Oral Daily Antonieta Pert, MD   15 mg at 07/15/18 0745  . mirtazapine (REMERON) tablet  45 mg  45 mg Oral QHS Charm Rings, NP   45 mg at 07/14/18 2105  . multivitamin with minerals tablet 1 tablet  1 tablet Oral Daily Charm Rings, NP   1 tablet at 07/15/18 0745  . pantoprazole (PROTONIX) EC tablet 40 mg  40 mg Oral Daily Charm Rings, NP   40 mg at 07/15/18 0746  . risperiDONE (RISPERDAL) tablet 0.5 mg  0.5 mg Oral Daily Antonieta Pert, MD   0.5 mg at 07/15/18 0744  . risperiDONE (RISPERDAL) tablet 2 mg  2 mg Oral QHS Antonieta Pert, MD   2 mg at 07/14/18 2105  . sulfamethoxazole-trimethoprim (BACTRIM) 400-80 MG per tablet 1 tablet  1 tablet Oral Q12H Antonieta Pert, MD   1 tablet at 07/15/18 0745    Lab Results:  No results found for this or any previous visit (from the past 48 hour(s)).  Blood Alcohol level:  Lab Results  Component Value Date   ETH <10 07/10/2018    Metabolic Disorder Labs: Lab Results  Component Value Date   HGBA1C 5.5 07/22/2017   MPG 111.15 07/22/2017   No results found for: PROLACTIN No results found for: CHOL, TRIG, HDL, CHOLHDL, VLDL, LDLCALC  Physical Findings: AIMS: Facial and Oral Movements Muscles of Facial Expression: None, normal Lips and Perioral Area: None, normal Jaw: None, normal Tongue: None, normal,Extremity Movements Upper (arms, wrists, hands, fingers): None, normal Lower (legs, knees, ankles, toes): None, normal, Trunk Movements Neck, shoulders, hips: None, normal, Overall Severity Severity of abnormal movements (highest score from questions above): None, normal Incapacitation due to abnormal  movements: None, normal Patient's awareness of abnormal movements (rate only patient's report): No Awareness, Dental Status Current problems with teeth and/or dentures?: No Does patient usually wear dentures?: No  CIWA:  CIWA-Ar Total: 5 COWS:  COWS Total Score: 5  Musculoskeletal: Strength & Muscle Tone: within normal limits Gait & Station: normal Patient leans: N/A  Psychiatric Specialty Exam: Physical Exam  Nursing note and vitals reviewed. Constitutional: She is oriented to person, place, and time. She appears well-developed and well-nourished.  HENT:  Head: Normocephalic and atraumatic.  Respiratory: Effort normal.  Neurological: She is alert and oriented to person, place, and time.    Review of Systems  Gastrointestinal: Positive for constipation.  Psychiatric/Behavioral: Positive for depression and hallucinations. The patient is nervous/anxious.   All other systems reviewed and are negative.   Blood pressure 106/74, pulse 87, temperature 97.8 F (36.6 C), resp. rate 18, height  (1.6 m), weight 75.8 kg, SpO2 98 %.Body mass index is 29.58 kg/m.  General Appearance: Casual  Eye Contact:  Fair  Speech:  Normal Rate  Volume:  Decreased  Mood:  Sedated  Affect:  Congruent  Thought Process:  Coherent and Descriptions of Associations: Intact  Orientation:  Full (Time, Place, and Person)  Thought Content:  Delusions and Hallucinations: Auditory  Suicidal Thoughts:  No  Homicidal Thoughts:  No  Memory:  Immediate;   Fair Recent;   Fair Remote;   Fair  Judgement:  Intact  Insight:  Fair  Psychomotor Activity:  Psychomotor Retardation  Concentration:  Concentration: Fair and Attention Span: Fair  Recall:  [employer] of Knowledge:  Fair  Language:  Fair  Akathisia:  Negative  Handed:  Right  AIMS (if indicated):     Assets:  Desire for Improvement Resilience  ADL's:  Intact  Cognition:  WNL  Sleep:  Number of Hours: 6.25  Treatment Plan Summary: Daily  contact with patient to assess and evaluate symptoms and progress in treatment and Medication management   Continue her current treatment plan on 07/15/2018 as listed below except were noted   Major depression, recurrent, severe with psychotic features,  Continue Xanax 0.5 mg p.o. 3 times daily  Continue Wellbutrin XL to 300 mg p.o. daily for depression. Continue mirtazapine 45 mg p.o. nightly. Continue Risperdal to 0.5 mg p.o. daily and 2 mg p.o. nightly for   CSW continue working on discharge disposition Patient encouraged to participate within the therapeutic milieu  Oneta Rack, NP 07/15/2018, 10:39 AM

## 2018-07-15 NOTE — [facility] Group Notes (Signed)
[facility] Group Notes:  (Nursing)  Date:  07/15/2018  Time: 130 PM Type of Therapy:  Nurse Education  Participation Level:  Did Not Attend  Shela Nevin 07/15/2018, 5:46 PM

## 2018-07-15 NOTE — [facility] Counselor (Signed)
Clinical Social Work Note  CSW spoke with husband at length to answer questions.  He is concerned that she does not understand her treatment.  Ambrose Mantle, LCSW 07/15/2018, 4:52 PM

## 2018-07-16 MED ORDER — ALPRAZOLAM 0.5 MG PO TABS
0.5000 mg | ORAL_TABLET | Freq: Two times a day (BID) | ORAL | Status: DC
  Administered 2018-07-16 – 2018-07-17 (×2): 0.5 mg via ORAL
  Filled 2018-07-16 (×2): qty 1

## 2018-07-16 NOTE — Progress Notes (Signed)
[facility] MD Progress Note  07/16/2018 10:28 AM Miranda Berger  MRN:  [id number]   Evaluation: Lashante reported she is making progress daily.  Patient reported feeling " like I am too critical of myself and I know I cannot control the future."  She denies suicidal or homicidal ideations.  Denies auditory visual hallucinations.  Patient stated taking medications as prescribed and tolerating them well however has concerns with Xanax being given too often.  Discussed making Xanax available twice a day as she states she is very anxious in the morning and late evening.  Continues to report her husband has been supportive during her admission.  Patient remains focused on goals and upcoming outpatient appointments.  Reports a good appetite.  States she is resting well throughout the night.  Support encouragement and reassurance was provided.   Principal Problem: MDD (major depressive disorder), recurrent severe, without psychosis (HCC) Diagnosis: Principal Problem:   MDD (major depressive disorder), recurrent severe, without psychosis (HCC) Active Problems:   Major depressive disorder, single episode, severe, with psychosis (HCC)  Total Time spent with patient: 20 minutes  Past Psychiatric History: See admission H&P  Past Medical History:  Past Medical History:  Diagnosis Date  . Anemia    in the past   . Anxiety   . Asthma   . Depression   . Family history of adverse reaction to anesthesia    mom had nausea  . GERD (gastroesophageal reflux disease)   . Hepatitis    Hepatitis A in 3rd grade?  [name] Hypertension   . PONV (postoperative nausea and vomiting)   . 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 Sleep apnea    uses CPAP, 12, starts at 6    Past Surgical History:  Procedure Laterality Date  . ABDOMINAL HYSTERECTOMY    . AORTIC VALVE REPLACEMENT N/A 07/26/2017   Procedure: MINIMALLY INVASIVE AORTIC VALVE REPLACEMENT (AVR);   Surgeon: Purcell Nails, MD;  Location: [facility] OR;  Service: Open Heart Surgery;  Laterality: N/A;  . BLADDER SURGERY  2011  . CARDIAC CATHETERIZATION    . CARPAL TUNNEL RELEASE  2012  . CHOLECYSTECTOMY    . RIGHT/LEFT HEART CATH AND CORONARY ANGIOGRAPHY N/A 07/08/2017   Procedure: RIGHT/LEFT HEART CATH AND CORONARY ANGIOGRAPHY;  Surgeon: Tonny Bollman, MD;  Location: [facility] INVASIVE CV LAB;  Service: Cardiovascular;  Laterality: N/A;  . TEE WITHOUT CARDIOVERSION N/A 07/26/2017   Procedure: TRANSESOPHAGEAL ECHOCARDIOGRAM (TEE);  Surgeon: Purcell Nails, MD;  Location: [facility] OR;  Service: Open Heart Surgery;  Laterality: N/A;  . TONSILLECTOMY     Family History:  Family History  Problem Relation Age of Onset  . Stroke Mother   . Leukemia Mother   . Stroke Father   . Sleep apnea Father   . Valvular heart disease Brother   . Heart Problems Maternal Aunt   . Valvular heart disease Paternal Grandfather   . Valvular heart disease Maternal Aunt    Family Psychiatric  History: See admission H&P Social History:  Social History   Substance and Sexual Activity  Alcohol Use No  . Alcohol/week: 0.0 standard drinks     Social History   Substance and Sexual Activity  Drug Use No    Social History   Socioeconomic History  . Marital status: Married    Spouse name: Not on file  . Number of children: 1  . Years of education: BS  . Highest  education level: Not on file  Occupational History  . Not on file  Social Needs  . Financial resource strain: Not on file  . Food insecurity:    Worry: Never true    Inability: Never true  . Transportation needs:    Medical: No    Non-medical: No  Tobacco Use  . Smoking status: Never Smoker  . Smokeless tobacco: Never Used  Substance and Sexual Activity  . Alcohol use: No    Alcohol/week: 0.0 standard drinks  . Drug use: No  . Sexual activity: Not on file  Lifestyle  . Physical activity:    Days per week: 0 days    Minutes per session: 0 min   . Stress: Rather much  Relationships  . Social connections:    Talks on phone: Not on file    Gets together: Not on file    Attends religious service: Not on file    Active member of club or organization: Not on file    Attends meetings of clubs or organizations: Not on file    Relationship status: Not on file  Other Topics Concern  . Not on file  Social History Narrative   Occasionally consumes tea or coffee   Additional Social History:    Pain Medications: See MARs Prescriptions: See MARs Over the Counter: See MARs History of alcohol / drug use?: Yes Longest period of sobriety (when/how long): "Months, I only drink socially with a friends" Negative Consequences of Use: (none) Withdrawal Symptoms: Other (Comment)(none) Name of Substance 1: Alcohol 1 - Age of First Use: unknown 1 - Amount (size/oz): "1 or 2 glasses of wine" 1 - Frequency: "when on a cruise or out" 1 - Duration: ongoing 1 - Last Use / Amount: over 6 months ago                  Sleep: Good  Appetite:  Fair  Current Medications: Current Facility-Administered Medications  Medication Dose Route Frequency Provider Last Rate Last Dose  . acebutolol (SECTRAL) capsule 200 mg  200 mg Oral Daily Charm Rings, NP   200 mg at 07/16/18 0805  . acetaminophen (TYLENOL) tablet 1,000 mg  1,000 mg Oral Q6H PRN Charm Rings, NP   1,000 mg at 07/12/18 0809  . albuterol (VENTOLIN HFA) 108 (90 Base) MCG/ACT inhaler 2 puff  2 puff Inhalation Q6H PRN Charm Rings, NP      . ALPRAZolam Prudy Feeler) tablet 0.5 mg  0.5 mg Oral TID Antonieta Pert, MD   0.5 mg at 07/16/18 4536  . alum & mag hydroxide-simeth (MAALOX/MYLANTA) 200-200-20 MG/5ML suspension 30 mL  30 mL Oral Q4H PRN Charm Rings, NP      . aspirin EC tablet 81 mg  81 mg Oral Daily Charm Rings, NP   81 mg at 07/16/18 4680  . buPROPion (WELLBUTRIN XL) 24 hr tablet 300 mg  300 mg Oral Daily Antonieta Pert, MD   300 mg at 07/16/18 3212  . calcium  carbonate (OS-CAL - dosed in mg of elemental calcium) tablet 1,250 mg  1,250 mg Oral Q breakfast Charm Rings, NP   1,250 mg at 07/16/18 2482  . fluticasone (FLONASE) 50 MCG/ACT nasal spray 2 spray  2 spray Each Nare Daily Antonieta Pert, MD   2 spray at 07/16/18 [phone]  . magnesium hydroxide (MILK OF MAGNESIA) suspension 30 mL  30 mL Oral Daily PRN Charm Rings, NP      .  meloxicam (MOBIC) tablet 15 mg  15 mg Oral Daily Antonieta Pert, MD   15 mg at 07/16/18 1610  . mirtazapine (REMERON) tablet 45 mg  45 mg Oral QHS Charm Rings, NP   45 mg at 07/15/18 2135  . multivitamin with minerals tablet 1 tablet  1 tablet Oral Daily Charm Rings, NP   1 tablet at 07/16/18 0804  . pantoprazole (PROTONIX) EC tablet 40 mg  40 mg Oral Daily Charm Rings, NP   40 mg at 07/16/18 0805  . polyethylene glycol (MIRALAX / GLYCOLAX) packet 17 g  17 g Oral Daily Oneta Rack, NP   17 g at 07/16/18 0804  . risperiDONE (RISPERDAL) tablet 0.5 mg  0.5 mg Oral Daily Antonieta Pert, MD   0.5 mg at 07/16/18 0805  . risperiDONE (RISPERDAL) tablet 2 mg  2 mg Oral QHS Antonieta Pert, MD   2 mg at 07/15/18 2135  . sulfamethoxazole-trimethoprim (BACTRIM) 400-80 MG per tablet 1 tablet  1 tablet Oral Q12H Antonieta Pert, MD   1 tablet at 07/16/18 9604    Lab Results:  No results found for this or any previous visit (from the past 48 hour(s)).  Blood Alcohol level:  Lab Results  Component Value Date   ETH <10 07/10/2018    Metabolic Disorder Labs: Lab Results  Component Value Date   HGBA1C 5.5 07/22/2017   MPG 111.15 07/22/2017   No results found for: PROLACTIN No results found for: CHOL, TRIG, HDL, CHOLHDL, VLDL, LDLCALC  Physical Findings: AIMS: Facial and Oral Movements Muscles of Facial Expression: None, normal Lips and Perioral Area: None, normal Jaw: None, normal Tongue: None, normal,Extremity Movements Upper (arms, wrists, hands, fingers): None, normal Lower (legs, knees,  ankles, toes): None, normal, Trunk Movements Neck, shoulders, hips: None, normal, Overall Severity Severity of abnormal movements (highest score from questions above): None, normal Incapacitation due to abnormal movements: None, normal Patient's awareness of abnormal movements (rate only patient's report): No Awareness, Dental Status Current problems with teeth and/or dentures?: No Does patient usually wear dentures?: No  CIWA:  CIWA-Ar Total: 5 COWS:  COWS Total Score: 5  Musculoskeletal: Strength & Muscle Tone: within normal limits Gait & Station: normal Patient leans: N/A  Psychiatric Specialty Exam: Physical Exam  Nursing note and vitals reviewed. Constitutional: She is oriented to person, place, and time. She appears well-developed and well-nourished.  HENT:  Head: Normocephalic and atraumatic.  Respiratory: Effort normal.  Neurological: She is alert and oriented to person, place, and time.    Review of Systems  Gastrointestinal: Positive for constipation.  Psychiatric/Behavioral: Positive for depression and hallucinations. The patient is nervous/anxious.   All other systems reviewed and are negative.   Blood pressure (!) 82/58, pulse (!) 101, temperature 98.5 F (36.9 C), temperature source Oral, resp. rate 18, height  (1.6 m), weight 75.8 kg, SpO2 98 %.Body mass index is 29.58 kg/m.  General Appearance: Casual  Eye Contact:  Fair  Speech:  Normal Rate  Volume:  Decreased  Mood:  Sedated  Affect:  Congruent  Thought Process:  Coherent and Descriptions of Associations: Intact  Orientation:  Full (Time, Place, and Person)  Thought Content:  Delusions and Hallucinations: Auditory  Suicidal Thoughts:  No  Homicidal Thoughts:  No  Memory:  Immediate;   Fair Recent;   Fair Remote;   Fair  Judgement:  Intact  Insight:  Fair  Psychomotor Activity:  Psychomotor Retardation  Concentration:  Concentration: Fair  and Attention Span: Fair  Recall:  [employer]  [employer] of  Knowledge:  Fair  Language:  Fair  Akathisia:  Negative  Handed:  Right  AIMS (if indicated):     Assets:  Desire for Improvement Resilience  ADL's:  Intact  Cognition:  WNL  Sleep:  Number of Hours: 6.25     Treatment Plan Summary: Daily contact with patient to assess and evaluate symptoms and progress in treatment and Medication management   Continue her current treatment plan on 07/16/2018 as listed below except were noted   Major depression, recurrent, severe with psychotic features,  Decreased  Xanax 0.5 mg p.o. 3 times daily to BID as requested by patient and feeling over sedated.  Continue Wellbutrin XL to 300 mg p.o. daily for depression. Continue mirtazapine 45 mg p.o. nightly. Continue Risperdal to 0.5 mg p.o. daily and 2 mg p.o. nightly for   CSW continue working on discharge disposition Patient encouraged to participate within the therapeutic milieu  Oneta Rackanika N Quayshawn Nin, NP 07/16/2018, 10:28 AM

## 2018-07-16 NOTE — [facility] Group Notes (Signed)
[facility] LCSW Group Therapy Note  07/16/2018  11:15AM-12:00PM  Type of Therapy and Topic:  Group Therapy:  Adding Supports Including Yourself  Participation Level:  Active   Description of Group:  Patients in this group were introduced to the concept that additional supports including self-support are an essential part of recovery.  Patients listed what supports they believe they need to add to their lives to achieve their goals at discharge, and they listed such things as therapist, family, doctor, support groups, and service dog.  CSW described the  continuum of mental health/substance abuse services available and the group discussed the differences among these including support group, therapy group, 12-step group, Doctor, hospital, Secondary school teacher, and such.  A song entitled "My Own Hero" was played and a group discussion ensued in which patients stated they could relate to the song and it inspired them to realize they have be willing to help themselves in order to succeed, because other people cannot achieve sobriety or stability for them.  A song was played called "I Am Enough" which led to a discussion about being willing to believe we are worth the effort of being a self-support.  Group members expressed appreciation for each other.  Therapeutic Goals: 1)  demonstrate the importance of being a key part of one's own support system 2)  discuss various available supports 3)  encourage patient to use music as part of their self-support and focus on goals 4)  elicit ideas from patients about supports that need to be added   Summary of Patient Progress:  The patient expressed that healthy supports in her life include family and friends and people who help her use her coping skill of traveling.  Unhealthy supports include a few friends and sometimes family due to "neediness and jealousy."  Therapeutic Modalities:   Motivational Interviewing Activity  Lynnell Chad

## 2018-07-16 NOTE — Progress Notes (Signed)
Adult Psychoeducational Group Note  Date:  07/16/2018 Time:  11:43 PM  Group Topic/Focus:  Wrap-Up Group:   The focus of this group is to help patients review their daily goal of treatment and discuss progress on daily workbooks.  Participation Level:  Active  Participation Quality:  Appropriate  Affect:  Appropriate  Cognitive:  Appropriate  Insight: Appropriate  Engagement in Group:  Engaged  Modes of Intervention:  Discussion  Additional Comments:  Pt her goal was to build confidence with what I've already learned.  Pt stated this an ongoing goal.  Pt rated the day at a 10/10.  Nur Krasinski 07/16/2018, 11:43 PM

## 2018-07-16 NOTE — Progress Notes (Signed)
Adult Psychoeducational Group Note  Date:  07/16/2018 Time:  6:10 PM  Group Topic/Focus:  Coping With Mental Health Crisis:   The purpose of this group is to help patients identify strategies for coping with mental health crisis.  Group discusses possible causes of crisis and ways to manage them effectively.  Participation Level:  Active  Participation Quality:  Appropriate  Affect:  Appropriate  Cognitive:  Alert  Insight: Appropriate  Engagement in Group:  Engaged  Modes of Intervention:  Discussion  Additional Comments:  Pt attended group and participated in discussion.  Edras Wilford R Laqueisha Catalina 07/16/2018, 6:10 PM

## 2018-07-16 NOTE — Progress Notes (Signed)
Pt presents with a flat affect and an anxious mood. Pt rates depression 2/10, anxiety 2, hopelessness 2. Pt reported fair sleep last night. Pt denies SI. Pt reported withdrawal symptoms of agitation, sedation and irritability from benzo's.  Pt stated goal "continue gaining confidence in what I know and have learned to be able to go home and live my life." pt requested to speak with the CSW and MD regarding discharge planning.   Medications reviewed with pt. Verbal support provided. Pt encouraged to attend groups. 15 minute checks performed for safety.  Pt compliant with tx plan.   [place] NOVEL CORONAVIRUS (COVID-19) DAILY CHECK-OFF SYMPTOMS - answer yes or no to each - every day NO YES  Have you had a fever in the past 24 hours?  . Fever (Temp > 37.80C / 100F) X   Have you had any of these symptoms in the past 24 hours? . New Cough .  Sore Throat  .  Shortness of Breath .  Difficulty Breathing .  Unexplained Body Aches   X   Have you had any one of these symptoms in the past 24 hours not related to allergies?   . Runny Nose .  Nasal Congestion .  Sneezing   X   If you have had runny nose, nasal congestion, sneezing in the past 24 hours, has it worsened?  X   EXPOSURES - check yes or no X   Have you traveled outside the state in the past 14 days?  X   Have you been in contact with someone with a confirmed diagnosis of COVID-19 or PUI in the past 14 days without wearing appropriate PPE?  X   Have you been living in the same home as a person with confirmed diagnosis of COVID-19 or a PUI (household contact)?    X   Have you been diagnosed with COVID-19?    X              What to do next: Answered NO to all: Answered YES to anything:   Proceed with unit schedule Follow the [facility] Inpatient Flowsheet.

## 2018-07-16 NOTE — Progress Notes (Signed)
D.  Pt pleasant on approach, denies complaints at this time.  Pt was positive for evening wrap up group, observed appropriately engaged with peers on the unit.  Pt denies SI/HI/AVH at this time.  A. Support and encouragement offered, medication given as ordered.  R.  Pt remains safe on the unit.  Will continue to monitor.

## 2018-07-16 NOTE — Progress Notes (Signed)
Pt signed a 72 hour request for discharge form on 5/24 at 1600

## 2018-07-17 MED ORDER — RISPERIDONE 2 MG PO TABS: 2.0000 mg | ORAL_TABLET | Freq: Every day | ORAL | 0 refills | Status: DC

## 2018-07-17 MED ORDER — BUPROPION HCL ER (XL) 300 MG PO TB24: 300.0000 mg | ORAL_TABLET | Freq: Every day | ORAL | 0 refills | Status: DC

## 2018-07-17 MED ORDER — MELOXICAM 15 MG PO TABS: 15.0000 mg | ORAL_TABLET | Freq: Every day | ORAL | 0 refills | Status: DC

## 2018-07-17 MED ORDER — RISPERIDONE 0.5 MG PO TABS: 0.5000 mg | ORAL_TABLET | Freq: Every day | ORAL | 0 refills | Status: DC

## 2018-07-17 NOTE — Progress Notes (Signed)
Patient ID: Miranda Berger, female   DOB: 11/15/1955, 63 y.o.   MRN: [id number]  Discharge Note  Patient denies SI/HI and states readiness for discharge.  Written and verbal discharge instructions reviewed with the patient. Patient accepting to information and verbalized understanding with no concerns. All belongings returned to patient from the unit and secured lockers. Patient has completed their Suicide Safety Plan and has been provided Suicide Prevention resources. Patient provided an opportunity to complete and return Patient Satisfaction Survey.   Patient was safely escorted to the lobby for discharge. Patient discharged from [facility] with prescriptions, personal belongings sent to pharmacy, follow-up appointment in place and discharge paperwork.

## 2018-07-17 NOTE — Discharge Summary (Addendum)
Physician Discharge Summary Note  Patient:  Miranda Berger is an 63 y.o., female MRN:  [id number] DOB:  Oct 13, 1955 Patient phone:  [phone] (home)  Patient address:   [address] [place] [address],  Total Time spent with patient: 20 minutes  Date of Admission:  07/11/2018 Date of Discharge: 07/17/18  Reason for Admission:  Worsening depression with paranoia and delusional thoughts  Principal Problem: MDD (major depressive disorder), recurrent severe, without psychosis (HCC) Discharge Diagnoses: Principal Problem:   MDD (major depressive disorder), recurrent severe, without psychosis (HCC) Active Problems:   Major depressive disorder, single episode, severe, with psychosis (HCC)   Past Psychiatric History: Patient has a longstanding history of depression.  She has been treated as an outpatient for several years.  She has previously been treated with Zoloft, Wellbutrin, mirtazapine.  This is her first psychiatric admission.  Past Medical History:  Past Medical History:  Diagnosis Date  . Anemia    in the past   . Anxiety   . Asthma   . Depression   . Family history of adverse reaction to anesthesia    mom had nausea  . GERD (gastroesophageal reflux disease)   . Hepatitis    Hepatitis A in 3rd grade?  [name] Hypertension   . PONV (postoperative nausea and vomiting)   . 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 Sleep apnea    uses CPAP, 12, starts at 6    Past Surgical History:  Procedure Laterality Date  . ABDOMINAL HYSTERECTOMY    . AORTIC VALVE REPLACEMENT N/A 07/26/2017   Procedure: MINIMALLY INVASIVE AORTIC VALVE REPLACEMENT (AVR);  Surgeon: Purcell Nails, MD;  Location: [facility] OR;  Service: Open Heart Surgery;  Laterality: N/A;  . BLADDER SURGERY  2011  . CARDIAC CATHETERIZATION    . CARPAL TUNNEL RELEASE  2012  . CHOLECYSTECTOMY    . RIGHT/LEFT HEART CATH AND CORONARY ANGIOGRAPHY N/A  07/08/2017   Procedure: RIGHT/LEFT HEART CATH AND CORONARY ANGIOGRAPHY;  Surgeon: Tonny Bollman, MD;  Location: [facility] INVASIVE CV LAB;  Service: Cardiovascular;  Laterality: N/A;  . TEE WITHOUT CARDIOVERSION N/A 07/26/2017   Procedure: TRANSESOPHAGEAL ECHOCARDIOGRAM (TEE);  Surgeon: Purcell Nails, MD;  Location: [facility] OR;  Service: Open Heart Surgery;  Laterality: N/A;  . TONSILLECTOMY     Family History:  Family History  Problem Relation Age of Onset  . Stroke Mother   . Leukemia Mother   . Stroke Father   . Sleep apnea Father   . Valvular heart disease Brother   . Heart Problems Maternal Aunt   . Valvular heart disease Paternal Grandfather   . Valvular heart disease Maternal Aunt    Family Psychiatric  History: She had an uncle with similar problems Social History:  Social History   Substance and Sexual Activity  Alcohol Use No  . Alcohol/week: 0.0 standard drinks     Social History   Substance and Sexual Activity  Drug Use No    Social History   Socioeconomic History  . Marital status: Married    Spouse name: Not on file  . Number of children: 1  . Years of education: BS  . Highest education level: Not on file  Occupational History  . Not on file  Social Needs  . Financial resource strain: Not on file  . Food insecurity:    Worry: Never true    Inability: Never true  . Transportation  needs:    Medical: No    Non-medical: No  Tobacco Use  . Smoking status: Never Smoker  . Smokeless tobacco: Never Used  Substance and Sexual Activity  . Alcohol use: No    Alcohol/week: 0.0 standard drinks  . Drug use: No  . Sexual activity: Not on file  Lifestyle  . Physical activity:    Days per week: 0 days    Minutes per session: 0 min  . Stress: Rather much  Relationships  . Social connections:    Talks on phone: Not on file    Gets together: Not on file    Attends religious service: Not on file    Active member of club or organization: Not on file    Attends  meetings of clubs or organizations: Not on file    Relationship status: Not on file  Other Topics Concern  . Not on file  Social History Narrative   Occasionally consumes tea or coffee    [facility]ospital [facility]   [facility] 63 [facility] female.  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  Patient presents today with disorganized thought, paranoia, depression, labile affect.  Patient reports that she has been to [employer] 5 times getting her phone checked out because she thought someone was following her.  She reports that she thinks her 63 year old son is going to come after her and she became tearful when talking about him coming back to the house.  She reports seeing things in the house that appeared to change.  She makes some report about some oxycodone that was old that she found and then makes a statement of never taking drugs when she was in high school.  Patient bounces from topic to topic.  Patient denies any significant mental health history other than some minor depression and some anxiety.  Patient also reports that she was attempting to contact her outpatient provider's office today but no one would answer back.  She stated that then she called 911 but patient could not be specific about why she was calling 911. Has been presented with the patient and he reports that this was fairly sudden onset of about a week to a week and a half ago.  He states that she does have some anxiety and depression but nothing has ever been this severe.  He reports that she has never been hospitalized.  He does report that she has a lot of medical issues and she is on a lot of medications.  Patient's husband states that he is concerned about if there is a interaction with her medications causing some issues but he could not detail any new medications that have been started recently.  Patient and patient's husband report that the patient takes Xanax, aspirin, mirtazapine,  bupropion, amoxicillin since she had heart surgery in 2019, took amoxicillin before her dental visit, and acebutolol.  Due to the patient's sudden onset of symptoms with no significant history of mental health there is concern for a medical complication.  [facility].  If patient is medically cleared, then request that patient be admitted to call behavioral health Hospital.  [facility]. The patient stabilized on medication and therapy. Patient was discharged on Wellbutrin XL 300 mg Daily, Remeron 45 mg QHS, Risperdal 0.5 mg QAM and 2 mg QHS, and continue home prescription of Xanax 0.5 mg BID PRN. Patient has  shown improvement with improved mood, affect, sleep, appetite, and interaction. Patient has attended group and participated. Patient has been seen in the day room interacting with peers and staff appropriately. Patient denies any SI/HI/AVH and contracts for safety. Patient agrees to follow up at [facility]. Patient is provided with prescriptions for their medications upon discharge.   Physical Findings: AIMS: Facial and Oral Movements Muscles of Facial Expression: None, normal Lips and Perioral Area: None, normal Jaw: None, normal Tongue: None, normal,Extremity Movements Upper (arms, wrists, hands, fingers): None, normal Lower (legs, knees, ankles, toes): None, normal, Trunk Movements Neck, shoulders, hips: None, normal, Overall Severity Severity of abnormal movements (highest score from questions above): None, normal Incapacitation due to abnormal movements: None, normal Patient's awareness of abnormal movements (rate only patient's report): No Awareness, Dental Status Current problems with teeth and/or dentures?: No Does patient usually wear dentures?: No  CIWA:  CIWA-Ar Total: 5 COWS:  COWS Total Score: 5  Musculoskeletal: Strength & Muscle Tone: within normal limits Gait & Station:  normal Patient leans: N/A  Psychiatric Specialty Exam: Physical Exam  Nursing note and vitals reviewed. Constitutional: She is oriented to person, place, and time. She appears well-developed and well-nourished.  Cardiovascular: Normal rate.  Respiratory: Effort normal.  Musculoskeletal: Normal range of motion.  Neurological: She is alert and oriented to person, place, and time.  Skin: Skin is warm.    Review of Systems  Constitutional: Negative.   HENT: Negative.   Eyes: Negative.   Respiratory: Negative.   Cardiovascular: Negative.   Gastrointestinal: Negative.   Genitourinary: Negative.   Musculoskeletal: Negative.   Skin: Negative.   Neurological: Negative.   Endo/Heme/Allergies: Negative.   Psychiatric/Behavioral: Negative.     Blood pressure 99/76, pulse 82, temperature 98.2 F (36.8 C), temperature source Oral, resp. rate 18, height  (1.6 m), weight 75.8 kg, SpO2 98 %.Body mass index is 29.58 kg/m.  General Appearance: Casual  Eye Contact:  Good  Speech:  Clear and Coherent and Normal Rate  Volume:  Normal  Mood:  Euthymic  Affect:  Congruent  Thought Process:  Coherent and Descriptions of Associations: Intact  Orientation:  Full (Time, Place, and Person)  Thought Content:  WDL  Suicidal Thoughts:  No  Homicidal Thoughts:  No  Memory:  Immediate;   Good Recent;   Good Remote;   Good  Judgement:  Good  Insight:  Good  Psychomotor Activity:  Normal  Concentration:  Concentration: Good and Attention Span: Good  Recall:  Good  Fund of Knowledge:  Good  Language:  Good  Akathisia:  No  Handed:  Right  AIMS (if indicated):     Assets:  Communication Skills Desire for Improvement Financial Resources/Insurance Housing Resilience Social Support  ADL's:  Intact  Cognition:  WNL  Sleep:  Number of Hours: 6.75     Have you used any form of tobacco in the last 30 days? (Cigarettes, Smokeless Tobacco, Cigars, and/or Pipes): No  Has this patient used any  form of tobacco in the last 30 days? (Cigarettes, Smokeless Tobacco, Cigars, and/or Pipes) Yes, No  Blood Alcohol level:  Lab Results  Component Value Date   ETH <10 07/10/2018    Metabolic Disorder Labs:  Lab Results  Component Value Date   HGBA1C 5.5 07/22/2017   MPG 111.15 07/22/2017   No results found for: PROLACTIN No results found for: CHOL, TRIG, HDL, CHOLHDL, VLDL, LDLCALC  See Psychiatric Specialty Exam and Suicide Risk Assessment completed by Attending Physician  prior to discharge.  Discharge destination:  Home  Is patient on multiple antipsychotic therapies at discharge:  No   Has Patient had three or more failed trials of antipsychotic monotherapy by history:  No  Recommended Plan for Multiple Antipsychotic Therapies: NA   Allergies as of 07/17/2018      Reactions   Codeine Itching   Prednisone Itching      Medication List    STOP taking these medications   amoxicillin 500 MG capsule Commonly known as:  AMOXIL   cyclobenzaprine 10 MG tablet Commonly known as:  FLEXERIL   naproxen sodium 220 MG tablet Commonly known as:  Aleve     TAKE these medications     Indication  acebutolol 200 MG capsule Commonly known as:  SECTRAL Take 1 capsule (200 mg total) by mouth daily.  Indication:  Per PCP   acetaminophen 500 MG tablet Commonly known as:  TYLENOL Take 1,000 mg by mouth every 6 (six) hours as needed for mild pain or fever.  Indication:  Pain   albuterol 108 (90 Base) MCG/ACT inhaler Commonly known as:  VENTOLIN HFA Inhale 2 puffs into the lungs every 6 (six) hours as needed for wheezing or shortness of breath.  Indication:  Chronic Obstructive Lung Disease   ALPRAZolam 0.5 MG tablet Commonly known as:  XANAX Take 0.5 mg by mouth 2 (two) times daily.  Indication:  Feeling Anxious   aspirin EC 81 MG tablet Take 81 mg by mouth daily.  Indication:  Per PCP   buPROPion 300 MG 24 hr tablet Commonly known as:  WELLBUTRIN XL Take 1 tablet  (300 mg total) by mouth daily. Start taking on:  Jul 18, 2018 What changed:    medication strength  how much to take  Indication:  Major Depressive Disorder   calcium carbonate 600 MG Tabs tablet Commonly known as:  OS-CAL Take 600 mg by mouth daily.  Indication:  Low Amount of Calcium in the Blood   fluticasone 50 MCG/ACT nasal spray Commonly known as:  FLONASE Place 1 spray into both nostrils daily as needed for allergies.  Indication:  Signs and Symptoms of Nose Diseases   Fluticasone-Salmeterol 250-50 MCG/DOSE Aepb Commonly known as:  ADVAIR Inhale 1 puff into the lungs 2 (two) times daily.  Indication:  Chronic Obstructive Lung Disease   meloxicam 15 MG tablet Commonly known as:  MOBIC Take 1 tablet (15 mg total) by mouth daily. Start taking on:  Jul 18, 2018  Indication:  Joint Damage causing Pain and Loss of Function   mirtazapine 45 MG tablet Commonly known as:  REMERON Take 45 mg by mouth at bedtime.  Indication:  Major Depressive Disorder   multivitamin capsule Take 1 capsule by mouth daily.  Indication:  Per PCP   omeprazole 20 MG capsule Commonly known as:  PRILOSEC Take 20 mg by mouth daily.  Indication:  Gastroesophageal Reflux Disease   risperiDONE 2 MG tablet Commonly known as:  RISPERDAL Take 1 tablet (2 mg total) by mouth at bedtime.  Indication:  mood stability   risperiDONE 0.5 MG tablet Commonly known as:  RISPERDAL Take 1 tablet (0.5 mg total) by mouth daily. Start taking on:  Jul 18, 2018  Indication:  mood stability      Follow-up Information    [facility] Follow up on 07/18/2018.   Why:  Medication management appointment with Deatra Robinson is Tuesday, 5/26 9:30a.  The appointment will be held over  the phone and Clydie Braun will contact you.  Contact information: [address] [place] [address] [phone]        [facility] Follow up on 07/24/2018.   Why:   Therapy appointment with Alyssa is Monday, 6/1 at 10:00a.  Please bring your photo ID and proof of insurance.  Call within 24 hours of discharge to confirm appointment and pay the $20 deposit.  Contact information: [address] [place] [address] [phone]           Follow-up recommendations:  Continue activity as tolerated. Continue diet as recommended by your PCP. Ensure to keep all appointments with outpatient providers.  Comments:  Patient is instructed prior to discharge to: Take all medications as prescribed by his/her mental healthcare provider. Report any adverse effects and or reactions from the medicines to his/her outpatient provider promptly. Patient has been instructed & cautioned: To not engage in alcohol and or illegal drug use while on prescription medicines. In the event of worsening symptoms, patient is instructed to call the crisis hotline, 911 and or go to the nearest ED for appropriate evaluation and treatment of symptoms. To follow-up with his/her primary care provider for your other medical issues, concerns and or health care needs.    Signed: Gerlene Burdock Marolyn Urschel, FNP 07/17/2018, 10:21 AM

## 2018-07-17 NOTE — [facility] Suicide Risk Assessment (Signed)
[facility] Discharge Suicide Risk Assessment   Principal Problem: MDD (major depressive disorder), recurrent severe, without psychosis (HCC) Discharge Diagnoses: Principal Problem:   MDD (major depressive disorder), recurrent severe, without psychosis (HCC) Active Problems:   Major depressive disorder, single episode, severe, with psychosis (HCC)   Total Time spent with patient: 15 minutes  Musculoskeletal: Strength & Muscle Tone: within normal limits Gait & Station: normal Patient leans: N/A  Psychiatric Specialty Exam: Review of Systems  All other systems reviewed and are negative.   Blood pressure (!) 82/58, pulse (!) 101, temperature 98.2 F (36.8 C), temperature source Oral, resp. rate 18, height 5\' 3"  (1.6 m), weight 75.8 kg, SpO2 98 %.Body mass index is 29.58 kg/m.  General Appearance: Casual  Eye Contact::  Fair  Speech:  Normal Rate409  Volume:  Normal  Mood:  Anxious  Affect:  Congruent  Thought Process:  Coherent and Descriptions of Associations: Intact  Orientation:  Full (Time, Place, and Person)  Thought Content:  Logical  Suicidal Thoughts:  No  Homicidal Thoughts:  No  Memory:  Immediate;   Fair Recent;   Fair Remote;   Fair  Judgement:  Intact  Insight:  Fair  Psychomotor Activity:  Normal  Concentration:  Fair  Recall:  [employer] of Knowledge:Fair  Language: Fair  Akathisia:  Negative  Handed:  Right  AIMS (if indicated):     Assets:  Desire for Improvement Resilience  Sleep:  Number of Hours: 6.75  Cognition: WNL  ADL's:  Intact   Mental Status Per Nursing Assessment::   On Admission:  NA  Demographic Factors:  Caucasian  Loss Factors: NA  Historical Factors: Impulsivity  Risk Reduction Factors:   Sense of responsibility to family, Living with another person, especially a relative, Positive social support and Positive therapeutic relationship  Continued Clinical Symptoms:  Bipolar Disorder:   Depressive phase  Cognitive Features  That Contribute To Risk:  None    Suicide Risk:  Minimal: No identifiable suicidal ideation.  Patients presenting with no risk factors but with morbid ruminations; may be classified as minimal risk based on the severity of the depressive symptoms  Follow-up Information    [facility] Follow up on 07/18/2018.   Why:  Medication management appointment with Deatra Robinson is Tuesday, 5/26 9:30a.  The appointment will be held over the phone and Clydie Braun will contact you.  Contact information: [address] [place] [address] [phone]        [facility] Follow up on 07/24/2018.   Why:  Therapy appointment with Alyssa is Monday, 6/1 at 10:00a.  Please bring your photo ID and proof of insurance.  Call within 24 hours of discharge to confirm appointment and pay the $20 deposit.  Contact information: [address] [place] [address] [phone]           Plan Of Care/Follow-up recommendations:  Activity:  ad lib  Antonieta Pert, MD 07/17/2018, 8:10 AM

## 2018-07-17 NOTE — Progress Notes (Signed)
  [facility] Adult Case Management Discharge Plan :  Will you be returning to the same living situation after discharge:  Yes,  at home At discharge, do you have transportation home?: Yes,  pt reports someone will come get her Do you have the ability to pay for your medications: Yes,  private insurance  Release of information consent forms completed and in the chart;  Patient's signature needed at discharge.  Patient to Follow up at: Follow-up Information    [facility] Follow up on 07/18/2018.   Why:  Medication management appointment with Deatra Robinson is Tuesday, 5/26 9:30a.  The appointment will be held over the phone and Clydie Braun will contact you.  Contact information: [address] [place] [address] [phone]        [facility] Follow up on 07/24/2018.   Why:  Therapy appointment with Alyssa is Monday, 6/1 at 10:00a.  Please bring your photo ID and proof of insurance.  Call within 24 hours of discharge to confirm appointment and pay the $20 deposit.  Contact information: [address] [place] [address] [phone]           Next level of care provider has access to [facility] Link:no  Safety Planning and Suicide Prevention discussed: Yes,  with husband  Have you used any form of tobacco in the last 30 days? (Cigarettes, Smokeless Tobacco, Cigars, and/or Pipes): No  Has patient been referred to the Quitline?: N/A patient is not a smoker  Patient has been referred for addiction treatment: N/A  Delphia Grates, LCSW 07/17/2018, 10:28 AM

## 2018-07-18 ENCOUNTER — Telehealth: Payer: Self-pay | Admitting: *Deleted

## 2018-07-18 NOTE — Telephone Encounter (Signed)
Pt was in hospital last week, got out on 5/25. Pt is having low BP: 92/something. 94/63. Then 101/72 and last reading 107/73. From this am to this afternoon. Pt is feeling lightheaded, headache, felt like she was going to pass out this morning. Pt is looking pale.

## 2018-07-18 NOTE — Telephone Encounter (Signed)
Attempted to call patient, call went directly to voicemail. Will try again in the morning

## 2018-07-19 NOTE — Telephone Encounter (Signed)
Phoned patient, no answer, left voice message with out callback number.

## 2018-07-20 NOTE — Progress Notes (Signed)
Virtual Visit via Video Note   This visit type was conducted due to national recommendations for restrictions regarding the COVID-19 Pandemic (e.g. social distancing) in an effort to limit this patient's exposure and mitigate transmission in our community.  Due to her co-morbid illnesses, this patient is at least at moderate risk for complications without adequate follow up.  This format is felt to be most appropriate for this patient at this time.  All issues noted in this document were discussed and addressed.  A limited physical exam was performed with this format.  Please refer to the patient's chart for her consent to telehealth for [facility].   Date:  07/21/2018   ID:  Miranda Gamblesita S Mones, DOB 06/05/1955, MRN [id number]  Patient Location: Home Provider Location: Office  PCP:  Shirlean MylarWebb, Carol, MD  Cardiologist:  Norman HerrlichBrian Darlina Mccaughey, MD  Electrophysiologist:  None   Evaluation Performed:  Follow-Up Visit  Chief Complaint:  My BP is low.  History of Present Illness:    Miranda GamblesRita S Berger is a 63 y.o. 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 She has had recurrent chest pain off and on since surgery. A FU CT chest was unremarkable 09/01/17. Preop left heart cath with normal coronary arteriography. She was last seen 05/01/18.  Med review shows that she is taking both Remeron and Risperdal both of which can cause orthostatic hypotension.  The episodes have improved and on the last day she has had no symptomatic hypotension.  We discussed general measures adding salt to her diet but she has chronic ankle edema support idea and start to use support hose.  If she has recurrent symptomatic episodes we could add midodrine.  I suspect this was a transient drug related phenomenon that should disappear with time she is comfortable with this approach   The patient does not have symptoms concerning for COVID-19 infection  (fever, chills, cough, or new shortness of breath).    Past Medical History:  Diagnosis Date  . Anemia    in the past   . Anxiety   . Asthma   . Depression   . Family history of adverse reaction to anesthesia    mom had nausea  . GERD (gastroesophageal reflux disease)   . Hepatitis    Hepatitis A in 3rd grade?  [name]. Hypertension   . PONV (postoperative nausea and vomiting)   . 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 Sleep apnea    uses CPAP, 12, starts at 6   Past Surgical History:  Procedure Laterality Date  . ABDOMINAL HYSTERECTOMY    . AORTIC VALVE REPLACEMENT N/A 07/26/2017   Procedure: MINIMALLY INVASIVE AORTIC VALVE REPLACEMENT (AVR);  Surgeon: Purcell Nailswen, Clarence H, MD;  [facility];  Service: Open Heart Surgery;  Laterality: N/A;  . BLADDER SURGERY  2011  . CARDIAC CATHETERIZATION    . CARPAL TUNNEL RELEASE  2012  . CHOLECYSTECTOMY    . RIGHT/LEFT HEART CATH AND CORONARY ANGIOGRAPHY N/A 07/08/2017   Procedure: RIGHT/LEFT HEART CATH AND CORONARY ANGIOGRAPHY;  Surgeon: Tonny Bollmanooper, Michael, MD;  [facility];  Service: Cardiovascular;  Laterality: N/A;  . TEE WITHOUT CARDIOVERSION N/A 07/26/2017   Procedure: TRANSESOPHAGEAL ECHOCARDIOGRAM (TEE);  Surgeon: Purcell Nailswen, Clarence H, MD;  [facility];  Service: Open Heart Surgery;  Laterality: N/A;  . TONSILLECTOMY  Current Meds  Medication Sig  . acebutolol (SECTRAL) 200 MG capsule Take 1 capsule (200 mg total) by mouth daily.  [name] acetaminophen (TYLENOL) 500 MG tablet Take 1,000 mg by mouth every 6 (six) hours as needed for mild pain or fever.   [name] albuterol (PROVENTIL HFA;VENTOLIN HFA) 108 (90 Base) MCG/ACT inhaler Inhale 2 puffs into the lungs every 6 (six) hours as needed for wheezing or shortness of breath.  . ALPRAZolam (XANAX) 0.5 MG tablet Take 0.5 mg by mouth 2 (two) times daily.   [name] aspirin EC 81 MG tablet Take 81 mg by mouth daily.   [name] buPROPion (WELLBUTRIN XL) 300 MG 24 hr tablet Take 1 tablet (300 mg total) by mouth daily.  . calcium carbonate (OS-CAL) 600 MG TABS tablet Take 600 mg by mouth daily.   . fluticasone (FLONASE) 50 MCG/ACT nasal spray Place 1 spray into both nostrils daily as needed for allergies.   . Fluticasone-Salmeterol (ADVAIR) 250-50 MCG/DOSE AEPB Inhale 1 puff into the lungs 2 (two) times daily.  . Loratadine (CLARITIN) 10 MG CAPS Take 1 capsule by mouth daily as needed.  . meloxicam (MOBIC) 15 MG tablet Take 1 tablet (15 mg total) by mouth daily.  . mirtazapine (REMERON) 45 MG tablet Take 45 mg by mouth at bedtime.  . Multiple Vitamin (MULTIVITAMIN) capsule Take 1 capsule by mouth daily.  [name] omeprazole (PRILOSEC) 20 MG capsule Take 20 mg by mouth daily.  . risperiDONE (RISPERDAL) 0.5 MG tablet Take 1 tablet (0.5 mg total) by mouth daily.  . risperiDONE (RISPERDAL) 2 MG tablet Take 1 tablet (2 mg total) by mouth at bedtime.     Allergies:   Codeine and Prednisone   Social History   Tobacco Use  . Smoking status: Never Smoker  . Smokeless tobacco: Never Used  Substance Use Topics  . Alcohol use: No    Alcohol/week: 0.0 standard drinks  . Drug use: No     Family Hx: The patient's family history includes Heart Problems in her maternal aunt; Leukemia in her mother; Sleep apnea in her father; Stroke in her father and mother; Valvular heart disease in her brother, maternal aunt, and paternal grandfather.  ROS:   Please see the history of present illness.     All other systems reviewed and are negative.   Prior CV studies:   The following studies were reviewed today:    Labs/Other Tests and Data Reviewed:    EKG:  No ECG reviewed.  Recent Labs: 07/27/2017: Magnesium 2.2 07/10/2018: ALT 19; BUN 14; Creatinine, Ser 0.90; Hemoglobin 13.1; Platelets 186; Potassium 3.8; Sodium 142 07/11/2018: TSH 1.834   Recent Lipid Panel No results found for: CHOL, TRIG, HDL, CHOLHDL, LDLCALC, LDLDIRECT   Wt Readings from Last 3 Encounters:  07/21/18 172 lb (78 kg)  05/01/18 172 lb 3.2 oz (78.1 kg)  04/04/18 176 lb 6.4 oz (80 kg)     Objective:    Vital Signs:  BP 114/72 (BP Location: Right Arm, Patient Position: Sitting, Cuff Size: Normal)   Wt 172 lb (78 kg)   BMI 30.47 kg/m    VITAL SIGNS:  reviewed GEN:  no acute distress EYES:  sclerae anicteric, EOMI - Extraocular Movements Intact RESPIRATORY:  normal respiratory effort, symmetric expansion CARDIOVASCULAR:  no peripheral edema SKIN:  no rash, lesions or ulcers. MUSCULOSKELETAL:  no obvious deformities. NEURO:  alert and oriented x 3, no obvious focal deficit PSYCH:  normal affect  ASSESSMENT & PLAN:    1. Hypotension transient  it is improved spontaneously I asked her to use support hose which should help mitigate the orthostatic effect of her medications and if the problem is ongoing and symptomatic we can give her low-dose midodrine 2. Status post aortic valve replacement stable she is recovered fully 3. Hypertension stable continue her low-dose beta-blocker which is also remarkably effective for relieving sinus tachycardia that was particularly bothersome after surgery causing palpitation  COVID-19 Education: The signs and symptoms of COVID-19 were discussed with the patient and how to seek care for testing (follow up with PCP or arrange E-visit).  The importance of social distancing was discussed today.  Time:   Today, I have spent 20 minutes with the patient with telehealth technology discussing the above problems.     Medication Adjustments/Labs and Tests Ordered: Current medicines are reviewed at length with the patient today.  Concerns regarding medicines are outlined above.   Tests Ordered: No orders of the defined types were placed in this encounter.   Medication Changes: No orders of the defined types were placed in this encounter.   Disposition:  Follow up in 6 month(s)  Signed, Norman Herrlich, MD   07/21/2018 3:40 PM    [facility]

## 2018-07-20 NOTE — Telephone Encounter (Addendum)
Patient reports low blood pressures since 07/17/18 ranging from 92 systolic to 94/43, 101/72 and she is feeling dizzy intermittently. Heart rates have been in the 80's. When she had the SBP of 92, she ate some salty peanuts and a dill pickle and drank water and BP went up to 101/72 after some time. She reports LE edema, denies shortness of breath or chest pain however she does have anxiety and when having an anxiety attack, her chest feels tight and like its hard to breathe. She does use inhalers on a regular basis for COPD    Denies weight gain, she currently weighs #176. She had an appt with Dr. Dulce Sellar on 5/19 but missed it due to being admitted to the hospital and would like to schedule another virtual or office visit. Medications reviewed, taking cardiac meds as ordered.       pls advise, tx

## 2018-07-20 NOTE — Telephone Encounter (Signed)
This is a very difficult phone message I think she needs to be seen either by her family physician or virtually by me her blood pressure tends to be relatively low this could be interaction with medications and if she likes to see me virtually I put her in the office tomorrow for hypotension.  Please be sure that she is using an arm cuff not wrist and asked that she checks both arms and use the higher arm

## 2018-07-20 NOTE — Telephone Encounter (Addendum)
Patient would like to schedule virtual visit with Dr. Dulce Sellar. Scheduled appt Fri. 07/20/18 at 1500. Will call pt back to obtain verbal consent for virtual visit.

## 2018-07-20 NOTE — Telephone Encounter (Addendum)
Phoned and left messages on home and mobile phone #'s to obtain consent and give instructions for virtual visit with Dr. Dulce Sellar tomorrow 07/21/18. Callback number left on voicemail.

## 2018-07-21 ENCOUNTER — Telehealth (INDEPENDENT_AMBULATORY_CARE_PROVIDER_SITE_OTHER): Payer: BC Managed Care – PPO | Admitting: Cardiology

## 2018-07-21 ENCOUNTER — Encounter: Payer: Self-pay | Admitting: Family

## 2018-07-21 ENCOUNTER — Telehealth: Payer: Self-pay | Admitting: Cardiology

## 2018-07-21 ENCOUNTER — Encounter: Payer: Self-pay | Admitting: Cardiology

## 2018-07-21 ENCOUNTER — Other Ambulatory Visit: Payer: Self-pay

## 2018-07-21 VITALS — BP 114/72 | Wt 172.0 lb

## 2018-07-21 DIAGNOSIS — I10 Essential (primary) hypertension: Secondary | ICD-10-CM | POA: Diagnosis not present

## 2018-07-21 DIAGNOSIS — R002 Palpitations: Secondary | ICD-10-CM

## 2018-07-21 DIAGNOSIS — Z953 Presence of xenogenic heart valve: Secondary | ICD-10-CM

## 2018-07-21 DIAGNOSIS — I959 Hypotension, unspecified: Secondary | ICD-10-CM

## 2018-07-21 NOTE — Progress Notes (Signed)
Travel restriction letter provided per Mrs. Doucet's request. Signed by Dr. Dulce Sellar. Faxed to her home per her request.   Alver Sorrow, NP

## 2018-07-21 NOTE — Patient Instructions (Signed)
Medication Instructions:  Your physician recommends that you continue on your current medications as directed. Please refer to the Current Medication list given to you today.  If you need a refill on your cardiac medications before your next appointment, please call your pharmacy.   Lab work: None  If you have labs (blood work) drawn today and your tests are completely normal, you will receive your results only by: [name] MyChart Message (if you have MyChart) OR . A paper copy in the mail If you have any lab test that is abnormal or we need to change your treatment, we will call you to review the results.  Testing/Procedures: None  Follow-Up: At [facility], you and your health needs are our priority.  As part of our continuing mission to provide you with exceptional heart care, we have created designated Provider Care Teams.  These Care Teams include your primary Cardiologist (physician) and Advanced Practice Providers (APPs -  Physician Assistants and Nurse Practitioners) who all work together to provide you with the care you need, when you need it. . You will need a follow up appointment in 6 months: Tuesday, 01/16/2019, at 1:20 pm in the [facility] office.   Any Other Special Instructions Will Be Listed Below (If Applicable). **Wear compression stockings daily!

## 2018-07-21 NOTE — Telephone Encounter (Signed)
Virtual Visit Pre-Appointment Phone Call  "(Name), I am calling you today to discuss your upcoming appointment. We are currently trying to limit exposure to the virus that causes COVID-19 by seeing patients at home rather than in the office."  1. "What is the BEST phone number to call the day of the visit?" - include this in appointment notes  2. Do you have or have access to (through a family member/friend) a smartphone with video capability that we can use for your visit?" a. If yes - list this number in appt notes as cell (if different from BEST phone #) and list the appointment type as a VIDEO visit in appointment notes b. If no - list the appointment type as a PHONE visit in appointment notes  3. Confirm consent - "In the setting of the current Covid19 crisis, you are scheduled for a (phone or video) visit with your provider on (date) at (time).  Just as we do with many in-office visits, in order for you to participate in this visit, we must obtain consent.  If you'd like, I can send this to your mychart (if signed up) or email for you to review.  Otherwise, I can obtain your verbal consent now.  All virtual visits are billed to your insurance company just like a normal visit would be.  By agreeing to a virtual visit, we'd like you to understand that the technology does not allow for your provider to perform an examination, and thus may limit your provider's ability to fully assess your condition. If your provider identifies any concerns that need to be evaluated in person, we will make arrangements to do so.  Finally, though the technology is pretty good, we cannot assure that it will always work on either your or our end, and in the setting of a video visit, we may have to convert it to a phone-only visit.  In either situation, we cannot ensure that we have a secure connection.  Are you willing to proceed?" STAFF: Did the patient verbally acknowledge consent to telehealth visit? Document  YES/NO here: YES  4. Advise patient to be prepared - "Two hours prior to your appointment, go ahead and check your blood pressure, pulse, oxygen saturation, and your weight (if you have the equipment to check those) and write them all down. When your visit starts, your provider will ask you for this information. If you have an Apple Watch or Kardia device, please plan to have heart rate information ready on the day of your appointment. Please have a pen and paper handy nearby the day of the visit as well."  5. Give patient instructions for MyChart download to smartphone OR Doximity/Doxy.me as below if video visit (depending on what platform provider is using)  6. Inform patient they will receive a phone call 15 minutes prior to their appointment time (may be from unknown caller ID) so they should be prepared to answer    TELEPHONE CALL NOTE  Miranda Berger has been deemed a candidate for a follow-up tele-health visit to limit community exposure during the Covid-19 pandemic. I spoke with the patient via phone to ensure availability of phone/video source, confirm preferred email & phone number, and discuss instructions and expectations.  I reminded Miranda Berger to be prepared with any vital sign and/or heart rhythm information that could potentially be obtained via home monitoring, at the time of her visit. I reminded Miranda Berger to expect a phone call prior to  her visit.  Miranda Berger 07/21/2018 9:30 AM   INSTRUCTIONS FOR DOWNLOADING THE MYCHART APP TO SMARTPHONE  - The patient must first make sure to have activated MyChart and know their login information - If Apple, go to [employer] Store and type in MyChart in the search bar and download the app. If Android, ask patient to go to [employer] Play Store and type in FentonMyChart in the search bar and download the app. The app is free but as with any other app downloads, their phone may require them to verify saved payment information or Apple/Android password.  -  The patient will need to then log into the app with their MyChart username and password, and select [facility] as their healthcare provider to link the account. When it is time for your visit, go to the MyChart app, find appointments, and click Begin Video Visit. Be sure to Select Allow for your device to access the Microphone and Camera for your visit. You will then be connected, and your provider will be with you shortly.  **If they have any issues connecting, or need assistance please contact MyChart service desk [phone])**  **If using a computer, in order to ensure the best quality for their visit they will need to use either of the following Internet Browsers: [employer] Edge, or Google Chrome**  IF USING DOXIMITY or DOXY.ME - The patient will receive a link just prior to their visit by text.     FULL LENGTH CONSENT FOR TELE-HEALTH VISIT   I hereby voluntarily request, consent and authorize [facility] and its employed or contracted physicians, physician assistants, nurse practitioners or other licensed health care professionals (the Practitioner), to provide me with telemedicine health care services (the Services") as deemed necessary by the treating Practitioner. I acknowledge and consent to receive the Services by the Practitioner via telemedicine. I understand that the telemedicine visit will involve communicating with the Practitioner through live audiovisual communication technology and the disclosure of certain medical information by electronic transmission. I acknowledge that I have been given the opportunity to request an in-person assessment or other available alternative prior to the telemedicine visit and am voluntarily participating in the telemedicine visit.  I understand that I have the right to withhold or withdraw my consent to the use of telemedicine in the course of my care at any time, without affecting my right to future care or treatment, and that the  Practitioner or I may terminate the telemedicine visit at any time. I understand that I have the right to inspect all information obtained and/or recorded in the course of the telemedicine visit and may receive copies of available information for a reasonable fee.  I understand that some of the potential risks of receiving the Services via telemedicine include:   Delay or interruption in medical evaluation due to technological equipment failure or disruption;  Information transmitted may not be sufficient (e.g. poor resolution of images) to allow for appropriate medical decision making by the Practitioner; and/or   In rare instances, security protocols could fail, causing a breach of personal health information.  Furthermore, I acknowledge that it is my responsibility to provide information about my medical history, conditions and care that is complete and accurate to the best of my ability. I acknowledge that Practitioner's advice, recommendations, and/or decision may be based on factors not within their control, such as incomplete or inaccurate data provided by me or distortions of diagnostic images or specimens that may result from electronic transmissions. I  understand that the practice of medicine is not an exact science and that Practitioner makes no warranties or guarantees regarding treatment outcomes. I acknowledge that I will receive a copy of this consent concurrently upon execution via email to the email address I last provided but may also request a printed copy by calling the office of [facility].    I understand that my insurance will be billed for this visit.   I have read or had this consent read to me.  I understand the contents of this consent, which adequately explains the benefits and risks of the Services being provided via telemedicine.   I have been provided ample opportunity to ask questions regarding this consent and the Services and have had my questions answered to my  satisfaction.  I give my informed consent for the services to be provided through the use of telemedicine in my medical care  By participating in this telemedicine visit I agree to the above.

## 2018-07-25 ENCOUNTER — Other Ambulatory Visit: Payer: Self-pay | Admitting: Obstetrics and Gynecology

## 2018-07-25 DIAGNOSIS — N6310 Unspecified lump in the right breast, unspecified quadrant: Secondary | ICD-10-CM

## 2018-07-28 ENCOUNTER — Other Ambulatory Visit: Payer: Self-pay | Admitting: Obstetrics and Gynecology

## 2018-07-28 ENCOUNTER — Ambulatory Visit
Admission: RE | Admit: 2018-07-28 | Discharge: 2018-07-28 | Disposition: A | Discharge status: Home / Self Care | Payer: BC Managed Care – PPO | Source: Ambulatory Visit | Attending: Obstetrics and Gynecology | Admitting: Obstetrics and Gynecology

## 2018-07-28 ENCOUNTER — Other Ambulatory Visit: Payer: Self-pay

## 2018-07-28 DIAGNOSIS — N6310 Unspecified lump in the right breast, unspecified quadrant: Secondary | ICD-10-CM

## 2018-07-31 ENCOUNTER — Ambulatory Visit: Payer: BC Managed Care – PPO | Admitting: Thoracic Surgery (Cardiothoracic Vascular Surgery)

## 2018-08-01 ENCOUNTER — Ambulatory Visit
Admission: RE | Admit: 2018-08-01 | Discharge: 2018-08-01 | Disposition: A | Discharge status: Home / Self Care | Payer: BC Managed Care – PPO | Source: Ambulatory Visit | Attending: Obstetrics and Gynecology | Admitting: Obstetrics and Gynecology

## 2018-08-01 ENCOUNTER — Other Ambulatory Visit: Payer: Self-pay

## 2018-08-01 DIAGNOSIS — N6310 Unspecified lump in the right breast, unspecified quadrant: Secondary | ICD-10-CM

## 2018-08-07 ENCOUNTER — Other Ambulatory Visit: Payer: Self-pay

## 2018-08-07 ENCOUNTER — Telehealth (INDEPENDENT_AMBULATORY_CARE_PROVIDER_SITE_OTHER): Payer: BC Managed Care – PPO | Admitting: Thoracic Surgery (Cardiothoracic Vascular Surgery)

## 2018-08-07 DIAGNOSIS — Z953 Presence of xenogenic heart valve: Secondary | ICD-10-CM | POA: Diagnosis not present

## 2018-08-07 NOTE — Progress Notes (Signed)
[address]       [place] [id number]             [phone]     CARDIOTHORACIC SURGERY TELEPHONE VIRTUAL OFFICE NOTE  Primary Cardiologist is Norman HerrlichBrian Munley, MD PCP is Shirlean MylarWebb, Carol, MD   HPI:  I spoke with [name] (DOB 05/15/1955 ) via telephone on 08/07/2018 at 11:26 AM and verified that I was speaking with the correct person using more than one form of identification.  We discussed the reason(s) for conducting our visit virtually instead of in-person.  The patient expressed understanding the circumstances and agreed to proceed as described.   Patient is a 63 year old female who underwent minimally invasive aortic valve replacement using a rapid deployment stented bovine pericardial tissue valve on July 26, 2017 for bicuspid aortic valve with severe symptomatic aortic stenosis.  Routine follow-up transthoracic echocardiogram performed September 27, 2017 revealed normal left ventricular systolic function with normal functioning bioprosthetic tissue valve in the aortic position and low transvalvular gradient.  She was last seen here in our office on May 01, 2018.  At that time she was having some problems with dizzy spells and this apparently resolved after Dr. Dulce SellarMunley adjusted her medications.  I spoke with the patient over the telephone today and she reports that she is doing exceptionally well.  Overall she states that she feels "much improved" in comparison with how she felt prior to her surgery.  She states that her stamina is noticeably improved.  She tries to walk every day and can walk a mile at a time.  She still gets short of breath or tired if she tries to do something strenuous, but overall her stamina is dramatically better than it was prior to surgery.  She denies any chest pain or chest tightness.  Issues related to postsurgical pain across her chest completely resolved.  Overall she is delighted with her progress.   Current Outpatient Medications   Medication Sig Dispense Refill  . acebutolol (SECTRAL) 200 MG capsule Take 1 capsule (200 mg total) by mouth daily. 30 capsule 3  . acetaminophen (TYLENOL) 500 MG tablet Take 1,000 mg by mouth every 6 (six) hours as needed for mild pain or fever.     [name]. albuterol (PROVENTIL HFA;VENTOLIN HFA) 108 (90 Base) MCG/ACT inhaler Inhale 2 puffs into the lungs every 6 (six) hours as needed for wheezing or shortness of breath.    . ALPRAZolam (XANAX) 0.5 MG tablet Take 0.5 mg by mouth 2 (two) times daily.     [name]. aspirin EC 81 MG tablet Take 81 mg by mouth daily.    [name]. buPROPion (WELLBUTRIN XL) 300 MG 24 hr tablet Take 1 tablet (300 mg total) by mouth daily. 30 tablet 0  . calcium carbonate (OS-CAL) 600 MG TABS tablet Take 600 mg by mouth daily.     . fluticasone (FLONASE) 50 MCG/ACT nasal spray Place 1 spray into both nostrils daily as needed for allergies.     . Fluticasone-Salmeterol (ADVAIR) 250-50 MCG/DOSE AEPB Inhale 1 puff into the lungs 2 (two) times daily.    . Loratadine (CLARITIN) 10 MG CAPS Take 1 capsule by mouth daily as needed.    . meloxicam (MOBIC) 15 MG tablet Take 1 tablet (15 mg total) by mouth daily. 30 tablet 0  . mirtazapine (REMERON) 45 MG tablet Take 45 mg by mouth at bedtime.    . Multiple Vitamin (MULTIVITAMIN) capsule Take 1 capsule by mouth daily.    .[name]  omeprazole (PRILOSEC) 20 MG capsule Take 20 mg by mouth daily.    . risperiDONE (RISPERDAL) 0.5 MG tablet Take 1 tablet (0.5 mg total) by mouth daily. 30 tablet 0  . risperiDONE (RISPERDAL) 2 MG tablet Take 1 tablet (2 mg total) by mouth at bedtime. 30 tablet 0   No current facility-administered medications for this visit.      Diagnostic Tests:  n/a   Impression:  Patient is doing well approximately 1 year status post aortic valve replacement  Plan:  We have not recommended any change to the patient's current medications.  In the future the patient will call and return to see [place] only should specific problems or  questions arise.    I discussed limitations of evaluation and management via telephone.  The patient was advised to call back for repeat telephone consultation or to seek an in-person evaluation if questions arise or the patient's clinical condition changes in any significant manner.  I spent in excess of 10 minutes of non-face-to-face time during the conduct of this telephone virtual office consultation.    Valentina Gu. Roxy Manns, MD 08/07/2018 11:26 AM

## 2018-08-07 NOTE — Patient Instructions (Signed)

## 2018-09-12 ENCOUNTER — Other Ambulatory Visit: Payer: Self-pay | Admitting: Cardiology

## 2018-09-12 MED ORDER — ACEBUTOLOL HCL 200 MG PO CAPS: 200.0000 mg | ORAL_CAPSULE | Freq: Every day | ORAL | 1 refills | Status: DC

## 2018-09-12 NOTE — Telephone Encounter (Signed)
Acebutolol refill sent to [facility] # [phone]

## 2018-09-12 NOTE — Telephone Encounter (Signed)
°*  STAT* If patient is at the pharmacy, call can be transferred to refill team.   1. Which medications need to be refilled? (please list name of each medication and dose if known) acebutolol (SECTRAL) 200 MG   2. Which pharmacy/location (including street and city if local pharmacy) is medication to be sent to?  [place], [place] [address] [place] [phone] (Phone) [phone] (Fax)     3. Do they need a 30 day or 90 day supply? 90 day

## 2018-10-02 ENCOUNTER — Other Ambulatory Visit: Payer: Self-pay | Admitting: Cardiology

## 2018-10-10 ENCOUNTER — Telehealth: Payer: Self-pay | Admitting: Cardiology

## 2018-10-10 NOTE — Telephone Encounter (Signed)
°*  STAT* If patient is at the pharmacy, call can be transferred to refill team.   1. Which medications need to be refilled? (please list name of each medication and dose if known) Amoxicilin 500mg   2. Which pharmacy/location (including street and city if local pharmacy) is medication to be sent to? [facility] [address]  3. Do they need a 30 day or 90 day supply? 12

## 2018-10-11 NOTE — Telephone Encounter (Signed)
Refill sent by Cherylann Parr, CMA yesterday, 10/11/2018.

## 2018-10-23 ENCOUNTER — Ambulatory Visit: Payer: BC Managed Care – PPO | Admitting: Adult Health

## 2018-10-23 ENCOUNTER — Encounter: Payer: Self-pay | Admitting: Adult Health

## 2018-10-23 ENCOUNTER — Other Ambulatory Visit: Payer: Self-pay

## 2018-10-23 VITALS — BP 137/82 | HR 72 | Temp 98.2°F | Ht 63.0 in | Wt 186.0 lb

## 2018-10-23 DIAGNOSIS — G4733 Obstructive sleep apnea (adult) (pediatric): Secondary | ICD-10-CM

## 2018-10-23 DIAGNOSIS — Z9989 Dependence on other enabling machines and devices: Secondary | ICD-10-CM | POA: Diagnosis not present

## 2018-10-23 NOTE — Progress Notes (Addendum)
PATIENT: Miranda ArenaRita Summers Berger DOB: 07/26/1955  REASON FOR VISIT: follow up HISTORY FROM: patient  HISTORY OF PRESENT ILLNESS: Today 10/23/18:  Miranda Berger is a 63 year old female with a history of obstructive sleep apnea on CPAP.  Her download indicates that she use her machine 30 out of 30 days for compliance of 100%.  She used her machine greater than 4 hours each night.  On average she uses her machine 8 hours and 57 minutes.  Her residual AHI is 0.8 on 13 cmH2O. she reports that the CPAP continues to work well for her.  She states that she has been dealing with anxiety and depression over the last year.  She states that sometimes she finds it hard to use the mask due to claustrophobia.  Otherwise she is doing well.  She returns today for an evaluation.  HISTORY  10/17/2017: I reviewed her CPAP compliance data from 09/17/2017 through 10/16/2017 which is a total of 30 days, during which time she used her CPAP every night with percent used days greater than 4 hours at 96.7%, indicating excellent compliance with an average usage of 7 hours and 39 minutes, residual AHI at goal at 0.7 per hour, leak very low, pressure of 13 cm. She reports that CPAP is going well. She had valve replacement surgery recently, on 07/26/17, she was diagnosed recently with severe AS. It runs in her family, but she had no significant Sx and was found to have a new murmur during a routine exam with her Gyn. She is feeling better, has started cardiac rehabilitation and is looking forward to that. Her energy level is not quite there yet but she is very helpful. She has recuperated from the surgery quite well thus far and did cope with the stress quite well. She felt that she did not even have time to process all of it.   REVIEW OF SYSTEMS: Out of a complete 14 system review of symptoms, the patient complains only of the following symptoms, and all other reviewed systems are negative.  Epworth sleepiness score 6  ALLERGIES:  Allergies  Allergen Reactions  . Codeine Itching  . Prednisone Itching    HOME MEDICATIONS: Outpatient Medications Prior to Visit  Medication Sig Dispense Refill  . acebutolol (SECTRAL) 200 MG capsule Take 1 capsule (200 mg total) by mouth daily. 90 capsule 1  . acetaminophen (TYLENOL) 500 MG tablet Take 1,000 mg by mouth every 6 (six) hours as needed for mild pain or fever.     [name]. albuterol (PROVENTIL HFA;VENTOLIN HFA) 108 (90 Base) MCG/ACT inhaler Inhale 2 puffs into the lungs every 6 (six) hours as needed for wheezing or shortness of breath.    . ALPRAZolam (XANAX) 0.5 MG tablet Take 0.5 mg by mouth 2 (two) times daily.     [name]. amoxicillin (AMOXIL) 500 MG capsule TAKE 4 CAPSULES BY MOUTH 45 MINUTES BEFORE DENTAL PROCEDURES 12 capsule 2  . aspirin EC 81 MG tablet Take 81 mg by mouth daily.    [name]. buPROPion (WELLBUTRIN XL) 300 MG 24 hr tablet Take 1 tablet (300 mg total) by mouth daily. 30 tablet 0  . calcium carbonate (OS-CAL) 600 MG TABS tablet Take 600 mg by mouth daily.     . fluticasone (FLONASE) 50 MCG/ACT nasal spray Place 1 spray into both nostrils daily as needed for allergies.     . Fluticasone-Salmeterol (ADVAIR) 250-50 MCG/DOSE AEPB Inhale 1 puff into the lungs 2 (two) times daily.    . Loratadine (CLARITIN)  10 MG CAPS Take 1 capsule by mouth daily as needed.    . meloxicam (MOBIC) 15 MG tablet Take 1 tablet (15 mg total) by mouth daily. 30 tablet 0  . mirtazapine (REMERON) 45 MG tablet Take 45 mg by mouth at bedtime.    . Multiple Vitamin (MULTIVITAMIN) capsule Take 1 capsule by mouth daily.    [name]. omeprazole (PRILOSEC) 20 MG capsule Take 20 mg by mouth daily.    . risperiDONE (RISPERDAL) 0.5 MG tablet Take 1 tablet (0.5 mg total) by mouth daily. (Patient taking differently: Take 0.5 mg by mouth at bedtime. ) 30 tablet 0  . risperiDONE (RISPERDAL) 2 MG tablet Take 1 tablet (2 mg total) by mouth at bedtime. 30 tablet 0   No facility-administered medications prior to visit.      PAST MEDICAL HISTORY: Past Medical History:  Diagnosis Date  . Anemia    in the past   . Anxiety   . Asthma   . Depression   . Family history of adverse reaction to anesthesia    mom had nausea  . GERD (gastroesophageal reflux disease)   . Hepatitis    Hepatitis A in 3rd grade?  [name]. Hypertension   . PONV (postoperative nausea and vomiting)   . 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 Sleep apnea    uses CPAP, 12, starts at 6    PAST SURGICAL HISTORY: Past Surgical History:  Procedure Laterality Date  . ABDOMINAL HYSTERECTOMY    . AORTIC VALVE REPLACEMENT N/A 07/26/2017   Procedure: MINIMALLY INVASIVE AORTIC VALVE REPLACEMENT (AVR);  Surgeon: Purcell Nailswen, Clarence H, MD;  [facility];  Service: Open Heart Surgery;  Laterality: N/A;  . BLADDER SURGERY  2011  . CARDIAC CATHETERIZATION    . CARPAL TUNNEL RELEASE  2012  . CHOLECYSTECTOMY    . RIGHT/LEFT HEART CATH AND CORONARY ANGIOGRAPHY N/A 07/08/2017   Procedure: RIGHT/LEFT HEART CATH AND CORONARY ANGIOGRAPHY;  Surgeon: Tonny Bollmanooper, Michael, MD;  [facility];  Service: Cardiovascular;  Laterality: N/A;  . TEE WITHOUT CARDIOVERSION N/A 07/26/2017   Procedure: TRANSESOPHAGEAL ECHOCARDIOGRAM (TEE);  Surgeon: Purcell Nailswen, Clarence H, MD;  [facility];  Service: Open Heart Surgery;  Laterality: N/A;  . TONSILLECTOMY      FAMILY HISTORY: Family History  Problem Relation Age of Onset  . Stroke Mother   . Leukemia Mother   . Stroke Father   . Sleep apnea Father   . Valvular heart disease Brother   . Heart Problems Maternal Aunt   . Valvular heart disease Paternal Grandfather   . Valvular heart disease Maternal Aunt     SOCIAL HISTORY: Social History   Socioeconomic History  . Marital status: Married    Spouse name: Not on file  . Number of children: 1  . Years of education: BS  . Highest education level: Not on file  Occupational History  .  Not on file  Social Needs  . Financial resource strain: Not on file  . Food insecurity    Worry: Never true    Inability: Never true  . Transportation needs    Medical: No    Non-medical: No  Tobacco Use  . Smoking status: Never Smoker  . Smokeless tobacco: Never Used  Substance and Sexual Activity  . Alcohol use: No    Alcohol/week: 0.0 standard drinks  . Drug use: No  . Sexual activity: Not on file  Lifestyle  . Physical activity    Days per week: 0 days    Minutes per session: 0 min  . Stress: Rather much  Relationships  . Social Herbalist on phone: Not on file    Gets together: Not on file    Attends religious service: Not on file    Active member of club or organization: Not on file    Attends meetings of clubs or organizations: Not on file    Relationship status: Not on file  . Intimate partner violence    Fear of current or ex partner: Not on file    Emotionally abused: Not on file    Physically abused: Not on file    Forced sexual activity: Not on file  Other Topics Concern  . Not on file  Social History Narrative   Occasionally consumes tea or coffee      PHYSICAL EXAM  Vitals:   10/23/18 1327  BP: 137/82  Pulse: 72  Temp: 98.2 F (36.8 C)  TempSrc: Oral  Weight: 186 lb (84.4 kg)  Height: 5\' 3"  (1.6 m)   Body mass index is 32.95 kg/m.  Generalized: Well developed, in no acute distress  Chest: lungs clear to auscultation bilaterally  Neurological examination  Mentation: Alert oriented to time, place, history taking. Follows all commands speech and language fluent Cranial nerve II-XII: Extraocular movements were full, visual field were full on confrontational test.Head turning and shoulder shrug  were normal and symmetric. Motor: The motor testing reveals 5 over 5 strength of all 4 extremities. Good symmetric motor tone is noted throughout.  Sensory: Sensory testing is intact to soft touch on all 4 extremities. No evidence of  extinction is noted.  Coordination: Cerebellar testing reveals good finger-nose-finger and heel-to-shin bilaterally.  Gait and station: Gait is normal. Tandem gait is normal. Romberg is negative. No drift is seen.  Reflexes: Deep tendon reflexes are symmetric and normal bilaterally.   DIAGNOSTIC DATA (LABS, IMAGING, TESTING) - I reviewed patient records, labs, notes, testing and imaging myself where available.  Lab Results  Component Value Date   WBC 5.9 07/10/2018   HGB 13.1 07/10/2018   HCT 39.4 07/10/2018   MCV 90.4 07/10/2018   PLT 186 07/10/2018      Component Value Date/Time   NA 142 07/10/2018 1733   NA 143 08/09/2017 0850   K 3.8 07/10/2018 1733   CL 105 07/10/2018 1733   CO2 27 07/10/2018 1733   GLUCOSE 118 (H) 07/10/2018 1733   BUN 14 07/10/2018 1733   BUN 14 08/09/2017 0850   CREATININE 0.90 07/10/2018 1733   CALCIUM 9.8 07/10/2018 1733   PROT 7.1 07/10/2018 1733   ALBUMIN 4.7 07/10/2018 1733   AST 17 07/10/2018 1733   ALT 19 07/10/2018 1733   ALKPHOS 53 07/10/2018 1733   BILITOT 0.6 07/10/2018 1733   GFRNONAA >60 07/10/2018 1733   GFRAA >60 07/10/2018 1733       ASSESSMENT AND PLAN 63 y.o. year old female  has a past medical history of Anemia, Anxiety, Asthma, Depression, Family history of adverse reaction to anesthesia, GERD (gastroesophageal reflux disease), Hepatitis, Hypertension, PONV (postoperative nausea and vomiting), 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 (07/26/2017), and Sleep apnea. here with:  1.  Obstructive sleep apnea on CPAP  Patient CPAP download shows excellent compliance and good treatment of her apnea.  She is encouraged to continue using CPAP nightly and greater than 4 hours each night.  She is advised that if her symptoms worsen or she develops new symptoms she should let us know.  She will follow-up in 1 year or sooner if needed.    I spent 15 minutes with the patient. 50% of this time was spent  reviewing CPAP download   Butch Penny, MSN, NP-C 10/23/2018, 1:43 PM [facility] [address], [address] [place], [place] [address] [phone]   I reviewed the above note and documentation by the Nurse Practitioner and agree with the history, exam, assessment and plan as outlined above. I was immediately available for consultation. Huston Foley, MD, PhD [facility])

## 2018-10-23 NOTE — Patient Instructions (Signed)

## 2019-01-12 ENCOUNTER — Telehealth: Payer: Self-pay | Admitting: Cardiology

## 2019-01-12 NOTE — Telephone Encounter (Signed)
Patient states that her husband was exposed to a Covid positive patient on 01-11-2019.  Patient would like to change appointment on 01-16-2019 to a virtual visit.  Dr Bettina Gavia is aware and agrees.  No further questions.

## 2019-01-12 NOTE — Telephone Encounter (Signed)
Patients husband was exposed to covid, should she keep her appt but make it virtual? Please advise.

## 2019-01-16 ENCOUNTER — Encounter: Payer: Self-pay | Admitting: Cardiology

## 2019-01-16 ENCOUNTER — Telehealth (INDEPENDENT_AMBULATORY_CARE_PROVIDER_SITE_OTHER): Payer: BC Managed Care – PPO | Admitting: Cardiology

## 2019-01-16 ENCOUNTER — Other Ambulatory Visit: Payer: Self-pay

## 2019-01-16 VITALS — BP 116/93 | HR 96 | Wt 178.0 lb

## 2019-01-16 DIAGNOSIS — R0789 Other chest pain: Secondary | ICD-10-CM | POA: Diagnosis not present

## 2019-01-16 DIAGNOSIS — Z7189 Other specified counseling: Secondary | ICD-10-CM | POA: Diagnosis not present

## 2019-01-16 DIAGNOSIS — Z953 Presence of xenogenic heart valve: Secondary | ICD-10-CM | POA: Diagnosis not present

## 2019-01-16 DIAGNOSIS — I447 Left bundle-branch block, unspecified: Secondary | ICD-10-CM

## 2019-01-16 MED ORDER — AMOXICILLIN 500 MG PO CAPS: ORAL_CAPSULE | ORAL | 2 refills | Status: DC

## 2019-01-16 NOTE — Addendum Note (Signed)
Addended by: Stevan Born on: 01/16/2019 02:48 PM   Modules accepted: Orders

## 2019-01-16 NOTE — Progress Notes (Signed)
Virtual Visit via Video Note   This visit type was conducted due to national recommendations for restrictions regarding the COVID-19 Pandemic (e.g. social distancing) in an effort to limit this patient's exposure and mitigate transmission in our community.  Due to her co-morbid illnesses, this patient is at least at moderate risk for complications without adequate follow up.  This format is felt to be most appropriate for this patient at this time.  All issues noted in this document were discussed and addressed.  A limited physical exam was performed with this format.  Please refer to the patient's chart for her consent to telehealth [facility].   Date:  01/16/2019   ID:  Miranda Berger, [place] 09/15/1955, MRN [id number]  PCP:  Shirlean MylarWebb, Carol, MD  Cardiologist:  Norman HerrlichBrian Munley, MD    Referring MD: Shirlean MylarWebb, Carol, MD    ASSESSMENT:    1. 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 LBBB (left bundle branch block)   3. Costochondral chest pain    PLAN:    In order of problems listed above:  1. Stable good functional recovery continue low-dose aspirin and follow-up in the office in 6 months. 2. Rate related left bundle branch block not seen on her last EKG she will need 1 next visit 3. Resolved 4. Hypotension resolved 5. To new low-dose selective beta-blocker for hypertension and palpitation  15 minutes spent during the visit including discussions of COVID-19 entities measures.  Her husband is at home quarantine after exposure. Next appointment: 6 months   Medication Adjustments/Labs and Tests Ordered: Current medicines are reviewed at length with the patient today.  Concerns regarding medicines are outlined above.  No orders of the defined types were placed in this encounter.  No orders of the defined types were placed in this encounter.   Chief Complaint  Patient presents with  . Follow-up    after SAVR    History of Present Illness:     Miranda Berger is a 63 y.o. female with a hx of   LFLG paradoxical AS with tissue AVR 07/26/17 hypertension sleep apnea sinus tachycardia  and LBBB  last seen 07/21/2018 for hypotension. Compliance with diet, lifestyle and medications: Yes  Markedly improved.  She is feeling good no chest pain shortness of breath palpitation or syncope.  Repeat blood pressure at home 126/85.  Edema orthopnea or palpitation.  Had no further hypotensive episodes Past Medical History:  Diagnosis Date  . Anemia    in the past   . Anxiety   . Asthma   . Depression   . Family history of adverse reaction to anesthesia    mom had nausea  . GERD (gastroesophageal reflux disease)   . Hepatitis    Hepatitis A in 3rd grade?  [name]. Hypertension   . PONV (postoperative nausea and vomiting)   . 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 Sleep apnea    uses CPAP, 12, starts at 6    Past Surgical History:  Procedure Laterality Date  . ABDOMINAL HYSTERECTOMY    . AORTIC VALVE REPLACEMENT N/A 07/26/2017   Procedure: MINIMALLY INVASIVE AORTIC VALVE REPLACEMENT (AVR);  Surgeon: Purcell Nailswen, Clarence H, MD;  [facility];  Service: Open Heart Surgery;  Laterality: N/A;  . BLADDER SURGERY  2011  . CARDIAC CATHETERIZATION    . CARPAL TUNNEL RELEASE  2012  . CHOLECYSTECTOMY    .  RIGHT/LEFT HEART CATH AND CORONARY ANGIOGRAPHY N/A 07/08/2017   Procedure: RIGHT/LEFT HEART CATH AND CORONARY ANGIOGRAPHY;  Surgeon: Tonny Bollman, MD;  [facility]VASIVE CV LAB;  Service: Cardiovascular;  Laterality: N/A;  . TEE WITHOUT CARDIOVERSION N/A 07/26/2017   Procedure: TRANSESOPHAGEAL ECHOCARDIOGRAM (TEE);  Surgeon: Purcell Nails, MD;  Location: [facility];  Service: Open Heart Surgery;  Laterality: N/A;  . TONSILLECTOMY      Current Medications: Current Meds  Medication Sig  . acebutolol (SECTRAL) 200 MG capsule Take 1 capsule (200 mg total) by mouth daily.   [name] acetaminophen (TYLENOL) 500 MG tablet Take 1,000 mg by mouth every 6 (six) hours as needed for mild pain or fever.   [name] albuterol (PROVENTIL HFA;VENTOLIN HFA) 108 (90 Base) MCG/ACT inhaler Inhale 2 puffs into the lungs every 6 (six) hours as needed for wheezing or shortness of breath.  . ALPRAZolam (XANAX) 0.5 MG tablet Take 0.25 mg by mouth daily.   [name] amoxicillin (AMOXIL) 500 MG capsule TAKE 4 CAPSULES BY MOUTH 45 MINUTES BEFORE DENTAL PROCEDURES  . aspirin EC 81 MG tablet Take 81 mg by mouth daily.  [name] buPROPion (WELLBUTRIN XL) 300 MG 24 hr tablet Take 1 tablet (300 mg total) by mouth daily.  . calcium carbonate (OS-CAL) 600 MG TABS tablet Take 600 mg by mouth daily.   . fluticasone (FLONASE) 50 MCG/ACT nasal spray Place 1 spray into both nostrils daily as needed for allergies.   . Fluticasone-Salmeterol (ADVAIR) 250-50 MCG/DOSE AEPB Inhale 1 puff into the lungs 2 (two) times daily.  . Loratadine (CLARITIN) 10 MG CAPS Take 1 capsule by mouth daily as needed.  . meloxicam (MOBIC) 15 MG tablet Take 1 tablet (15 mg total) by mouth daily.  . mirtazapine (REMERON) 15 MG tablet Take 15 mg by mouth at bedtime.   . Multiple Vitamin (MULTIVITAMIN) capsule Take 1 capsule by mouth daily.  . risperiDONE (RISPERDAL) 0.25 MG tablet Take 0.25 mg by mouth at bedtime.     Allergies:   Codeine and Prednisone   Social History   Socioeconomic History  . Marital status: Married    Spouse name: Not on file  . Number of children: 1  . Years of education: BS  . Highest education level: Not on file  Occupational History  . Not on file  Social Needs  . Financial resource strain: Not on file  . Food insecurity    Worry: Never true    Inability: Never true  . Transportation needs    Medical: No    Non-medical: No  Tobacco Use  . Smoking status: Never Smoker  . Smokeless tobacco: Never Used  Substance and Sexual Activity  . Alcohol use: No    Alcohol/week: 0.0 standard drinks  . Drug use: No  .  Sexual activity: Not on file  Lifestyle  . Physical activity    Days per week: 0 days    Minutes per session: 0 min  . Stress: Rather much  Relationships  . Social Musician on phone: Not on file    Gets together: Not on file    Attends religious service: Not on file    Active member of club or organization: Not on file    Attends meetings of clubs or organizations: Not on file    Relationship status: Not on file  Other Topics Concern  . Not on file  Social History Narrative   Occasionally consumes tea or coffee     Family History:  The patient's family history includes Heart Problems in her maternal aunt; Leukemia in her mother; Sleep apnea in her father; Stroke in her father and mother; Valvular heart disease in her brother, maternal aunt, and paternal grandfather. ROS:   Please see the history of present illness.    All other systems reviewed and are negative.  EKGs/Labs/Other Studies Reviewed:    The following studies were reviewed today:  EKG:  EKG demonstrates 07/17/2018 [place] normal EKG  Echo 09/27/2017: Study Conclusions - Left ventricle: The cavity size was normal. Wall thickness was   increased in a pattern of mild LVH. Systolic function was normal.   The estimated ejection fraction was in the range of 50% to 55%.   Wall motion was normal; there were no regional wall motion   abnormalities. Doppler parameters are consistent with abnormal   left ventricular relaxation (grade 1 diastolic dysfunction). - Aortic valve: A bioprosthesis was present, normal appearance and   doppler profile Valve area (VTI): 1.19 cm^2. Valve area (Vmax):   1.05 cm^2. Valve area (Vmean): 1.24 cm^2. - Mitral valve: There was mild regurgitation. - Pericardium, extracardiac: There was no pericardial effusion.  Recent Labs: 07/10/2018: ALT 19; BUN 14; Creatinine, Ser 0.90; Hemoglobin 13.1; Platelets 186; Potassium 3.8; Sodium 142 07/11/2018: TSH 1.834  Recent Lipid Panel No  results found for: CHOL, TRIG, HDL, CHOLHDL, VLDL, LDLCALC, LDLDIRECT  Physical Exam:    VS:  BP (!) 116/93   Pulse 96   Wt 178 lb (80.7 kg)   BMI 31.53 kg/m     Wt Readings from Last 3 Encounters:  01/16/19 178 lb (80.7 kg)  10/23/18 186 lb (84.4 kg)  07/21/18 172 lb (78 kg)     Constitutional, well-nourished well-developed in no acute distress Vital signs reviewed Eyes, conjunctiva and sclera are normal without pallor or icterus extraocular motions intact and normal there is no lid lag Respiratory, normal effort and excursion no audible wheezing without a stethoscope Cardiovascular, no neck vein distention or peripheral edema Skin, no rash skin lesion or ulceration of the extremities Neurologic, cranial nerves II to XII are grossly intact and the patient moves all 4 extremities Neuro/Psychiatric, judgment and thought processes are intact and coherent, alert and oriented x3, mood and affect appear normal.   Signed, Shirlee More, MD  01/16/2019 1:19 PM    [place]

## 2019-01-16 NOTE — Patient Instructions (Signed)
Medication Instructions:  Your physician recommends that you continue on your current medications as directed. Please refer to the Current Medication list given to you today.  *If you need a refill on your cardiac medications before your next appointment, please call your pharmacy*  Lab Work: None  If you have labs (blood work) drawn today and your tests are completely normal, you will receive your results only by: . MyChart Message (if you have MyChart) OR . A paper copy in the mail If you have any lab test that is abnormal or we need to change your treatment, we will call you to review the results.  Testing/Procedures: None  Follow-Up: At [facility], you and your health needs are our priority.  As part of our continuing mission to provide you with exceptional heart care, we have created designated Provider Care Teams.  These Care Teams include your primary Cardiologist (physician) and Advanced Practice Providers (APPs -  Physician Assistants and Nurse Practitioners) who all work together to provide you with the care you need, when you need it.  Your next appointment:   9 month(s)  The format for your next appointment:   In Person  Provider:   Brian Munley, MD    

## 2019-01-31 ENCOUNTER — Other Ambulatory Visit: Payer: Self-pay

## 2019-01-31 DIAGNOSIS — Z20822 Contact with and (suspected) exposure to covid-19: Secondary | ICD-10-CM

## 2019-02-02 LAB — NOVEL CORONAVIRUS, NAA: SARS-CoV-2, NAA: NOT DETECTED

## 2019-03-12 ENCOUNTER — Other Ambulatory Visit: Payer: Self-pay | Admitting: Cardiology

## 2019-03-12 MED ORDER — ACEBUTOLOL HCL 200 MG PO CAPS: 200.0000 mg | ORAL_CAPSULE | Freq: Every day | ORAL | 3 refills | Status: DC

## 2019-03-12 NOTE — Telephone Encounter (Signed)
*  STAT* If patient is at the pharmacy, call can be transferred to refill team.   1. Which medications need to be refilled? (please list name of each medication and dose if known) Acebutolol 200mg   2. Which pharmacy/location (including street and city if local pharmacy) is medication to be sent to?[facility] [address] [place]  3. Do they need a 30 day or 90 day supply? 90

## 2019-03-12 NOTE — Telephone Encounter (Signed)
Acebutolol refill sent to [facility].

## 2019-05-30 ENCOUNTER — Ambulatory Visit: Payer: Self-pay

## 2019-05-30 ENCOUNTER — Ambulatory Visit: Payer: BC Managed Care – PPO | Admitting: Orthopedic Surgery

## 2019-05-30 ENCOUNTER — Other Ambulatory Visit: Payer: Self-pay

## 2019-05-30 DIAGNOSIS — M2021 Hallux rigidus, right foot: Secondary | ICD-10-CM | POA: Diagnosis not present

## 2019-05-30 DIAGNOSIS — M25571 Pain in right ankle and joints of right foot: Secondary | ICD-10-CM | POA: Diagnosis not present

## 2019-05-30 DIAGNOSIS — M5440 Lumbago with sciatica, unspecified side: Secondary | ICD-10-CM

## 2019-06-01 ENCOUNTER — Encounter: Payer: Self-pay | Admitting: Orthopedic Surgery

## 2019-06-01 NOTE — Progress Notes (Signed)
Office Visit Note   Patient: Miranda Berger           Date of Birth: 08-15-1955           MRN: [id number] Visit Date: 05/30/2019 Requested by: Maurice Small, MD [address],  [place] [address] PCP: Maurice Small, MD  Subjective: Chief Complaint  Patient presents with  . Right Foot - Pain  . Lower Back - Pain    HPI: Miranda Berger is a 64 y.o. female who presents to the office complaining of right foot and back pain.  Patient notes that she has developed right foot pain over the last 2 weeks.  She states that it bothers her when she is standing for long periods of time.  She localizes the pain to the proximal aspect of the right great toe.  Pain is worse with weightbearing.  He has been improving slightly over the last several weeks since onset 2 weeks ago.  She has tried Tylenol without significant relief as well as Mobic without any relief.  She denies any swelling or bruising or injury.  She has not tried any topical medications.  Additionally she complains of low back pain bilaterally with her left side bothering her more than her right side.  She localizes pain to the low back with radiation to the bilateral buttocks.  She denies any radicular component further down the legs.  She denies any numbness or tingling.  No groin pain.  She does note that pain is severe and wakes her up at night.  She has no history of back surgery or back MRI.  She has tried a home exercise program without significant relief.  She does note occasional subjective weakness as well of her bilateral legs.  This pain is been present on and off for the last 2 years but is becoming increasingly a concern of hers..                ROS:  All systems reviewed are negative as they relate to the chief complaint within the history of present illness.  Patient denies fevers or chills.  Assessment & Plan: Visit Diagnoses:  1. Pain in right ankle and joints of right foot   2. Bilateral low back  pain with sciatica, sciatica laterality unspecified, unspecified chronicity   3. Hallux rigidus, right foot     Plan: Patient is a 64 year old female who presents complaining of right foot and low back pain.  Right foot pain is been present for the last 2 weeks and is not associated with an injury.  Radiographs of the right foot taken today reveal hallux rigidus of the right great toe.  She has tenderness to palpation over the first MTP joint as well as decreased range of motion of that joint compared with the contralateral first MTP joint.  Patient will try over-the-counter Voltaren gel for relief.  There is not too much in terms of dorsal spurring which would suggest cheilectomy would be beneficial.  From a surgical perspective she is looking at either fusion or MTP replacement.  Could consider injection for temporary relief but that would need to be done under fluoroscopy or ultrasound to ensure intra-articular placement.  Additionally, patient complains of low back pain with more pain on her left than her right.  She does note subjective weakness in both legs and she has tried a home exercise program without any relief.  Pain wakes her up at night.  On exam  she has tenderness to palpation through the bilateral SI joints and a positive straight leg raise on the right.  Ordered MRI of the lumbar spine to evaluate facet osteoarthritis versus sacroiliitis.  Patient will follow up after MRI to review results.  At that time we will also reevaluate her right foot pain.  Follow-Up Instructions: No follow-ups on file.   Orders:  Orders Placed This Encounter  Procedures  . XR Foot Complete Right  . XR Lumbar Spine 2-3 Views  . MR Lumbar Spine w/o contrast   No orders of the defined types were placed in this encounter.     Procedures: No procedures performed   Clinical Data: No additional findings.  Objective: Vital Signs: There were no vitals taken for this visit.  Physical Exam:    Constitutional: Patient appears well-developed HEENT:  Head: Normocephalic Eyes:EOM are normal Neck: Normal range of motion Cardiovascular: Normal rate Pulmonary/chest: Effort normal Neurologic: Patient is alert Skin: Skin is warm Psychiatric: Patient has normal mood and affect  Ortho Exam:  Tenderness to palpation through the plantar, dorsal, medial/lateral aspects of the first MTP joint of the right foot.  Decreased flexion/extension of the first MTP joint of the right foot compared with the contralateral side.  No significant tenderness throughout the rest of the forefoot, midfoot, hindfoot.  No tenderness through the lateral or medial malleoli of the right foot.  Tender to palpation through the low axial lumbar spine and the bilateral SI joints.  Positive straight leg raise of the right lower extremity.  Positive FABER test bilaterally.  Decreased pain with distraction of the SI joint bilaterally.  Specialty Comments:  No specialty comments available.  Imaging: No results found.   PMFS History: Patient Active Problem List   Diagnosis Date Noted  . 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 Costochondral chest pain 04/04/2018  . Pericarditis 09/12/2017  . LBBB (left bundle branch block) 08/08/2017  . Obstructive sleep apnea   . Anxiety   . Asthma   . 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 07/26/2017  . Hypertension 06/21/2017  . Aortic stenosis 06/20/2017  . Degenerative tear of medial meniscus of right knee 04/21/2016   Past Medical History:  Diagnosis Date  . Anemia    in the past   . Anxiety   . Asthma   . Depression   . Family history of adverse reaction to anesthesia    mom had nausea  . GERD (gastroesophageal reflux disease)   . Hepatitis    Hepatitis A in 3rd grade?  [name] Hypertension   . PONV  (postoperative nausea and vomiting)   . 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 Sleep apnea    uses CPAP, 12, starts at 6    Family History  Problem Relation Age of Onset  . Stroke Mother   . Leukemia Mother   . Stroke Father   . Sleep apnea Father   . Valvular heart disease Brother   . Heart Problems Maternal Aunt   . Valvular heart disease Paternal Grandfather   . Valvular heart disease Maternal Aunt     Past Surgical History:  Procedure Laterality Date  . ABDOMINAL HYSTERECTOMY    . AORTIC VALVE REPLACEMENT N/A 07/26/2017   Procedure: MINIMALLY INVASIVE AORTIC VALVE REPLACEMENT (AVR);  Surgeon: Purcell Nails, MD;  Location: [facility] OR;  Service: Open Heart Surgery;  Laterality: N/A;  . BLADDER SURGERY  2011  . CARDIAC CATHETERIZATION    . CARPAL TUNNEL RELEASE  2012  . CHOLECYSTECTOMY    . RIGHT/LEFT HEART CATH AND CORONARY ANGIOGRAPHY N/A 07/08/2017   Procedure: RIGHT/LEFT HEART CATH AND CORONARY ANGIOGRAPHY;  Surgeon: Tonny Bollman, MD;  [facility]: [facility] INVASIVE CV LAB;  Service: Cardiovascular;  Laterality: N/A;  . TEE WITHOUT CARDIOVERSION N/A 07/26/2017   Procedure: TRANSESOPHAGEAL ECHOCARDIOGRAM (TEE);  Surgeon: Purcell Nails, MD;  Location: [facility] OR;  Service: Open Heart Surgery;  Laterality: N/A;  . TONSILLECTOMY     Social History   Occupational History  . Not on file  Tobacco Use  . Smoking status: Never Smoker  . Smokeless tobacco: Never Used  Substance and Sexual Activity  . Alcohol use: No    Alcohol/week: 0.0 standard drinks  . Drug use: No  . Sexual activity: Not on file

## 2019-06-27 ENCOUNTER — Ambulatory Visit
Admission: RE | Admit: 2019-06-27 | Discharge: 2019-06-27 | Disposition: A | Discharge status: Home / Self Care | Payer: BC Managed Care – PPO | Source: Ambulatory Visit | Attending: Orthopedic Surgery | Admitting: Orthopedic Surgery

## 2019-06-27 ENCOUNTER — Other Ambulatory Visit: Payer: Self-pay

## 2019-06-27 DIAGNOSIS — M5440 Lumbago with sciatica, unspecified side: Secondary | ICD-10-CM

## 2019-07-04 ENCOUNTER — Ambulatory Visit (INDEPENDENT_AMBULATORY_CARE_PROVIDER_SITE_OTHER): Payer: BC Managed Care – PPO | Admitting: Orthopedic Surgery

## 2019-07-04 ENCOUNTER — Other Ambulatory Visit: Payer: Self-pay

## 2019-07-04 DIAGNOSIS — M5440 Lumbago with sciatica, unspecified side: Secondary | ICD-10-CM | POA: Diagnosis not present

## 2019-07-04 DIAGNOSIS — M25552 Pain in left hip: Secondary | ICD-10-CM | POA: Diagnosis not present

## 2019-07-04 DIAGNOSIS — M2021 Hallux rigidus, right foot: Secondary | ICD-10-CM | POA: Diagnosis not present

## 2019-07-04 DIAGNOSIS — M25559 Pain in unspecified hip: Secondary | ICD-10-CM

## 2019-07-06 ENCOUNTER — Encounter: Payer: Self-pay | Admitting: Orthopedic Surgery

## 2019-07-06 NOTE — Progress Notes (Signed)
Office Visit Note   Patient: Miranda Berger           Date of Birth: 16-Mar-1955           MRN: [id number] Visit Date: 07/04/2019 Requested by: Shirlean Mylar, MD [address] [place],  [place] [address] PCP: Shirlean Mylar, MD  Subjective: Chief Complaint  Patient presents with  . Follow-up    HPI: Miranda Berger is a 64 y.o. female who presents to the office complaining of low back pain.  Patient complains of low back pain with anterior and lateral leg radicular symptoms.  Worse in her left leg.  She denies any numbness or tingling down her leg.  Denies any weakness of her legs.  She reports to the office for review of MRI of the lumbar spine.  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  Patient also complains of right great toe pain that is causing her to alter her gait because it hurts to walk on that toe.  She has been trying to use Voltaren gel without significant relief.  She requests injection in that toe..                ROS:  All systems reviewed are negative as they relate to the chief complaint within the history of present illness.  Patient denies fevers or chills.  Assessment & Plan: Visit Diagnoses:  1. Bilateral low back pain with sciatica, sciatica laterality unspecified, unspecified chronicity   2. Hallux rigidus, right foot   3. Greater trochanteric pain syndrome     Plan: Patient is a 64 year old female presents complaining of low back pain and lateral left hip pain with right great toe pain.  MRI images were reviewed with the patient.  MRI results are as described in the HPI.  On exam patient has tenderness to palpation over the left greater trochanter as well as tenderness to palpation over the axial lumbar spine about the level of L4 and around the sacroiliac joint on the left.   Seems to be more tender over the axial spine at L4.  Her lateral hip pain is keeping her up at night and making it difficult to lay on that side.  Plan for left trochanteric bursa injection today.  Also administered first MTP joint injection of the right great toe.  She tolerated these injections well.  Plan to refer patient to Dr. Naaman Plummer for epidural steroid injections.  Recommend try facet ESI first as this seems to be her primary area of pain rather than the left SI joint.  Patient agreed with this plan and will follow up as needed.  Follow-Up Instructions: No follow-ups on file.   Orders:  No orders of the defined types were placed in this encounter.  No orders of the defined types were placed in this encounter.     Procedures: Large Joint Inj: L greater trochanter on 07/07/2019 7:38 PM Indications: pain and diagnostic evaluation Details: 18 G 3.5 in needle, ultrasound-guided lateral approach  Arthrogram: No  Medications: 5 mL lidocaine 1 %; 80 mg methylPREDNISolone acetate 80 MG/ML; 4 mL bupivacaine 0.25 % Outcome: tolerated well, no immediate complications Procedure, treatment alternatives, risks and benefits explained, specific risks discussed. Consent was given by the patient. Immediately prior to procedure a time out was called to verify the  correct patient, procedure, equipment, support staff and site/side marked as required. Patient was prepped and draped in the usual sterile fashion.   Small Joint Inj: R great MTP on 07/07/2019 7:39 PM Details: 25 G needle, ultrasound-guided dorsal approach      Clinical Data: No additional findings.  Objective: Vital Signs: There were no vitals taken for this visit.  Physical Exam:  Constitutional: Patient appears well-developed HEENT:  Head: Normocephalic Eyes:EOM are normal Neck: Normal range of motion Cardiovascular: Normal rate Pulmonary/chest: Effort normal Neurologic: Patient is alert Skin: Skin is warm Psychiatric:  Patient has normal mood and affect  Ortho Exam:  Tender to palpation at the axial lumbar spine around the level of L4.  Negative straight leg raise bilaterally.  Tender to palpation at the left sacroiliac joint.  No significant tenderness palpation throughout the rest of the lumbar spine.  Tender to palpation over the left greater trochanter with no such tenderness over the right greater trochanter.  Tender to palpation over the first MTP joint of the right great toe with loss of range of motion compared with the contralateral toe.  5/5 motor strength of the bilateral hip flexors, quadriceps, hamstring, dorsiflexion, plantarflexion.  Sensation intact through all dermatomes of the bilateral lower extremities.  Specialty Comments:  No specialty comments available.  Imaging: No results found.   PMFS History: Patient Active Problem List   Diagnosis Date Noted  . Major depressive disorder, single episode, severe, with psychosis ([place]) 07/11/2018  . MDD (major depressive disorder), recurrent severe, without psychosis ([place]) 07/10/2018  . Costochondral chest pain 04/04/2018  . Pericarditis 09/12/2017  . LBBB (left bundle branch block) 08/08/2017  . Obstructive sleep apnea   . Anxiety   . Asthma   . 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 07/26/2017  . Hypertension 06/21/2017  . Aortic stenosis 06/20/2017  . Degenerative tear of medial meniscus of right knee 04/21/2016   Past Medical History:  Diagnosis Date  . Anemia    in the past   . Anxiety   . Asthma   . Depression   . Family history of adverse reaction to anesthesia    mom had nausea  . GERD (gastroesophageal reflux disease)   . Hepatitis    Hepatitis A in 3rd grade?  [name] Hypertension   . PONV (postoperative nausea and vomiting)   . 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 07/26/2017   [employer], size 23  . Sleep apnea    uses CPAP, 12, starts at 6      Family History  Problem Relation Age of Onset  . Stroke Mother   . Leukemia Mother   . Stroke Father   . Sleep apnea Father   . Valvular heart disease Brother   . Heart Problems Maternal Aunt   . Valvular heart disease Paternal Grandfather   . Valvular heart disease Maternal Aunt     Past Surgical History:  Procedure Laterality Date  . ABDOMINAL HYSTERECTOMY    . AORTIC VALVE REPLACEMENT N/A 07/26/2017   Procedure: MINIMALLY INVASIVE AORTIC VALVE REPLACEMENT (AVR);  Surgeon: Rexene Alberts, MD;  Location: [facility];  Service: Open Heart Surgery;  Laterality: N/A;  . BLADDER SURGERY  2011  . CARDIAC CATHETERIZATION    . CARPAL TUNNEL RELEASE  2012  . CHOLECYSTECTOMY    . RIGHT/LEFT HEART CATH AND CORONARY ANGIOGRAPHY N/A 07/08/2017   Procedure: RIGHT/LEFT HEART CATH AND CORONARY ANGIOGRAPHY;  Surgeon: Sherren Mocha, MD;  Location:  [facility] INVASIVE CV LAB;  Service: Cardiovascular;  Laterality: N/A;  . TEE WITHOUT CARDIOVERSION N/A 07/26/2017   Procedure: TRANSESOPHAGEAL ECHOCARDIOGRAM (TEE);  Surgeon: Purcell Nails, MD;  Location: [facility] OR;  Service: Open Heart Surgery;  Laterality: N/A;  . TONSILLECTOMY     Social History   Occupational History  . Not on file  Tobacco Use  . Smoking status: Never Smoker  . Smokeless tobacco: Never Used  Substance and Sexual Activity  . Alcohol use: No    Alcohol/week: 0.0 standard drinks  . Drug use: No  . Sexual activity: Not on file

## 2019-07-07 ENCOUNTER — Encounter: Payer: Self-pay | Admitting: Orthopedic Surgery

## 2019-07-07 DIAGNOSIS — M25552 Pain in left hip: Secondary | ICD-10-CM

## 2019-07-07 DIAGNOSIS — M2021 Hallux rigidus, right foot: Secondary | ICD-10-CM

## 2019-07-07 MED ORDER — LIDOCAINE HCL 1 % IJ SOLN
5.0000 mL | INTRAMUSCULAR | Status: AC | PRN
  Administered 2019-07-07: 5 mL

## 2019-07-07 MED ORDER — METHYLPREDNISOLONE ACETATE 80 MG/ML IJ SUSP
80.0000 mg | INTRAMUSCULAR | Status: AC | PRN
  Administered 2019-07-07: 80 mg via INTRA_ARTICULAR

## 2019-07-07 MED ORDER — BUPIVACAINE HCL 0.25 % IJ SOLN
4.0000 mL | INTRAMUSCULAR | Status: AC | PRN
  Administered 2019-07-07: 4 mL via INTRA_ARTICULAR

## 2019-07-09 ENCOUNTER — Other Ambulatory Visit: Payer: Self-pay | Admitting: Family Medicine

## 2019-07-09 ENCOUNTER — Ambulatory Visit
Admission: RE | Admit: 2019-07-09 | Discharge: 2019-07-09 | Disposition: A | Discharge status: Home / Self Care | Payer: BC Managed Care – PPO | Source: Ambulatory Visit | Attending: Family Medicine | Admitting: Family Medicine

## 2019-07-09 DIAGNOSIS — R0602 Shortness of breath: Secondary | ICD-10-CM

## 2019-07-25 ENCOUNTER — Other Ambulatory Visit: Payer: Self-pay

## 2019-07-25 ENCOUNTER — Telehealth: Payer: Self-pay | Admitting: Orthopedic Surgery

## 2019-07-25 DIAGNOSIS — M5440 Lumbago with sciatica, unspecified side: Secondary | ICD-10-CM

## 2019-07-25 NOTE — Telephone Encounter (Signed)
Pt called stating Dr. August Saucer was supposed to set something up with Dr. Alvester Morin but then she never heard anything.   [phone]

## 2019-07-25 NOTE — Telephone Encounter (Signed)
I put in order for this. I called patient and explained that this was missed somehow but would send to you guys about getting patient in.

## 2019-07-26 NOTE — Telephone Encounter (Signed)
Referral sent to Dr. Newton for review. 

## 2019-08-20 ENCOUNTER — Other Ambulatory Visit: Payer: Self-pay

## 2019-08-20 ENCOUNTER — Ambulatory Visit: Payer: Self-pay

## 2019-08-20 ENCOUNTER — Ambulatory Visit: Payer: BC Managed Care – PPO | Admitting: Physical Medicine and Rehabilitation

## 2019-08-20 ENCOUNTER — Encounter: Payer: Self-pay | Admitting: Physical Medicine and Rehabilitation

## 2019-08-20 VITALS — BP 106/78 | HR 75

## 2019-08-20 DIAGNOSIS — M47816 Spondylosis without myelopathy or radiculopathy, lumbar region: Secondary | ICD-10-CM | POA: Diagnosis not present

## 2019-08-20 MED ORDER — METHYLPREDNISOLONE ACETATE 80 MG/ML IJ SUSP
80.0000 mg | Freq: Once | INTRAMUSCULAR | Status: AC
  Administered 2019-08-20: 80 mg

## 2019-08-20 NOTE — Progress Notes (Signed)
Pt states pain is in across the lower back with more pain on the right side today. Pt states she has some pain down her legs. Does have a hx of hip pain as well. Pt states nothing makes pain better. Pt states walking increases pain.  Numeric Pain Rating Scale and Functional Assessment Average Pain 8   In the last MONTH (on 0-10 scale) has pain interfered with the following?  1. General activity like being  able to carry out your everyday physical activities such as walking, climbing stairs, carrying groceries, or moving a chair?  Rating(10)   +Driver, -BT, -Dye Allergies.

## 2019-08-20 NOTE — Progress Notes (Signed)
Miranda Berger - 64 y.o. female MRN [id number]  Date of birth: 1955/08/31  Office Visit Note: Visit Date: 08/20/2019 PCP: Shirlean Mylar, MD Referred by: Shirlean Mylar, MD  Subjective: Chief Complaint  Patient presents with  . Lower Back - Pain   HPI:  Miranda Berger is a 64 y.o. female who comes in today at the request of Dr. Burnard Bunting for planned Bilateral L4-L5 lumbar facet/medial branch block with fluoroscopic guidance.  The patient has failed conservative care including home exercise, medications, time and activity modification.  This injection will be diagnostic and hopefully therapeutic.  Please see requesting physician notes for further details and justification.  Exam shows concordant low back pain with facet joint loading and extension.  L5 transitional segment, sacralized.  ROS Otherwise per HPI.  Assessment & Plan: Visit Diagnoses:  1. Spondylosis without myelopathy or radiculopathy, lumbar region     Plan: No additional findings.   Meds & Orders:  Meds ordered this encounter  Medications  . methylPREDNISolone acetate (DEPO-MEDROL) injection 80 mg    Orders Placed This Encounter  Procedures  . Facet Injection  . XR C-ARM NO REPORT    Follow-up: No follow-ups on file.   Procedures: No procedures performed  Lumbar Facet Joint Intra-Articular Injection(s) with Fluoroscopic Guidance  Patient: Miranda Berger      Date of Birth: 03-23-1955 MRN: [id number] PCP: Shirlean Mylar, MD      Visit Date: 08/20/2019   Universal Protocol:    Date/Time: 08/20/2019  Consent Given By: the patient  Position: PRONE   Additional Comments: Vital signs were monitored before and after the procedure. Patient was prepped and draped in the usual sterile fashion. The correct patient, procedure, and site was verified.   Injection Procedure Details:  Procedure Site One Meds Administered:  Meds ordered this encounter  Medications  . methylPREDNISolone acetate  (DEPO-MEDROL) injection 80 mg     Laterality: Bilateral  Location/Site: Sacralized L5 segment per last MRI L4-L5  Needle size: 22 guage  Needle type: Spinal  Needle Placement: Articular  Findings:  -Comments: Excellent flow of contrast producing a partial arthrogram.  Procedure Details: The fluoroscope beam is vertically oriented in AP, and the inferior recess is visualized beneath the lower pole of the inferior apophyseal process, which represents the target point for needle insertion. When direct visualization is difficult the target point is located at the medial projection of the vertebral pedicle. The region overlying each aforementioned target is locally anesthetized with a 1 to 2 ml. volume of 1% Lidocaine without Epinephrine.   The spinal needle was inserted into each of the above mentioned facet joints using biplanar fluoroscopic guidance. A 0.25 to 0.5 ml. volume of Isovue-250 was injected and a partial facet joint arthrogram was obtained. A single spot film was obtained of the resulting arthrogram.    One to 1.25 ml of the steroid/anesthetic solution was then injected into each of the facet joints noted above.   Additional Comments:  The patient tolerated the procedure well Dressing: 2 x 2 sterile gauze and Band-Aid    Post-procedure details: Patient was observed during the procedure. Post-procedure instructions were reviewed.  Patient left the clinic in stable condition.     Clinical History: MRI LUMBAR SPINE WITHOUT CONTRAST  TECHNIQUE: Multiplanar, multisequence MR imaging of the lumbar spine was performed. No intravenous contrast was administered.  COMPARISON:  Lumbar radiographs dated 05/30/2019 and lumbar MRI dated 09/21/2016 and CT scans of the chest abdomen and pelvis  dated 07/20/2017  FINDINGS: Segmentation: There are 12 rib-bearing vertebra and only 4 non-rib-bearing vertebra in the lumbar region. This means that L5 is congenitally fused with  the sacrum. There is a vestigial disc at L5-S1.  Alignment:  Physiologic.  Vertebrae:  No fracture, evidence of discitis, or bone lesion.  Conus medullaris and cauda equina: Conus extends to the L1-2 level. Conus and cauda equina appear normal.  Paraspinal and other soft tissues: Negative.  Disc levels:  T9-10 through L1-2: Normal.  L2-3: Tiny disc bulge into the right neural foramen with no neural impingement. Minimal hypertrophy of the ligamentum flavum with no spinal or foraminal stenosis. Otherwise normal.  L3-4: Tiny disc bulge into the right neural foramen with no neural impingement minimal degenerative changes of the right facet joint with minimal ligamentum flavum hypertrophy, right greater than left. No spinal or foraminal stenosis or neural impingement.  L4-5: Normal disc moderate left and minimal right facet arthritis. No foraminal or spinal stenosis.  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  IMPRESSION: 1. 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 2. 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 3. Minimal degenerative disc disease at L2-3 and L2-3 without neural impingement.   Electronically Signed   By: Francene Boyers M.D.   On: 06/27/2019 11:47     Objective:  VS:  HT:    WT:   BMI:     BP:106/78  HR:75bpm  TEMP: ( )  RESP:  Physical Exam Constitutional:      General: She is not in acute distress.    Appearance: Normal appearance. She is not ill-appearing.  HENT:     Head: Normocephalic and atraumatic.     Right Ear: External ear normal.     Left Ear: External ear normal.  Eyes:     Extraocular Movements: Extraocular movements intact.  Cardiovascular:     Rate and Rhythm: Normal rate.     Pulses: Normal pulses.  Musculoskeletal:     Right lower leg: No edema.     Left lower leg: No edema.     Comments: Patient has good distal strength with no pain over the  greater trochanters.  No clonus or focal weakness.  Skin:    Findings: No erythema, lesion or rash.  Neurological:     General: No focal deficit present.     Mental Status: She is alert and oriented to person, place, and time.     Sensory: No sensory deficit.     Motor: No weakness or abnormal muscle tone.     Coordination: Coordination normal.  Psychiatric:        Mood and Affect: Mood normal.        Behavior: Behavior normal.      Imaging: No results found.

## 2019-08-20 NOTE — Procedures (Signed)
Lumbar Facet Joint Intra-Articular Injection(s) with Fluoroscopic Guidance  Patient: Miranda Berger      Date of Birth: 1955/09/01 MRN: [id number] PCP: Shirlean Mylar, MD      Visit Date: 08/20/2019   Universal Protocol:    Date/Time: 08/20/2019  Consent Given By: the patient  Position: PRONE   Additional Comments: Vital signs were monitored before and after the procedure. Patient was prepped and draped in the usual sterile fashion. The correct patient, procedure, and site was verified.   Injection Procedure Details:  Procedure Site One Meds Administered:  Meds ordered this encounter  Medications  . methylPREDNISolone acetate (DEPO-MEDROL) injection 80 mg     Laterality: Bilateral  Location/Site: Sacralized L5 segment per last MRI L4-L5  Needle size: 22 guage  Needle type: Spinal  Needle Placement: Articular  Findings:  -Comments: Excellent flow of contrast producing a partial arthrogram.  Procedure Details: The fluoroscope beam is vertically oriented in AP, and the inferior recess is visualized beneath the lower pole of the inferior apophyseal process, which represents the target point for needle insertion. When direct visualization is difficult the target point is located at the medial projection of the vertebral pedicle. The region overlying each aforementioned target is locally anesthetized with a 1 to 2 ml. volume of 1% Lidocaine without Epinephrine.   The spinal needle was inserted into each of the above mentioned facet joints using biplanar fluoroscopic guidance. A 0.25 to 0.5 ml. volume of Isovue-250 was injected and a partial facet joint arthrogram was obtained. A single spot film was obtained of the resulting arthrogram.    One to 1.25 ml of the steroid/anesthetic solution was then injected into each of the facet joints noted above.   Additional Comments:  The patient tolerated the procedure well Dressing: 2 x 2 sterile gauze and Band-Aid     Post-procedure details: Patient was observed during the procedure. Post-procedure instructions were reviewed.  Patient left the clinic in stable condition.

## 2019-10-23 ENCOUNTER — Ambulatory Visit: Payer: BC Managed Care – PPO | Admitting: Adult Health

## 2019-10-23 ENCOUNTER — Encounter: Payer: Self-pay | Admitting: Adult Health

## 2019-10-23 VITALS — BP 110/75 | HR 71 | Ht 63.0 in | Wt 159.6 lb

## 2019-10-23 DIAGNOSIS — Z9989 Dependence on other enabling machines and devices: Secondary | ICD-10-CM | POA: Diagnosis not present

## 2019-10-23 DIAGNOSIS — G4733 Obstructive sleep apnea (adult) (pediatric): Secondary | ICD-10-CM | POA: Diagnosis not present

## 2019-10-23 NOTE — Patient Instructions (Signed)
Continue using CPAP nightly and greater than 4 hours each night °If your symptoms worsen or you develop new symptoms please let us know.  ° °

## 2019-10-23 NOTE — Progress Notes (Signed)
PATIENT: Miranda Berger DOB: 1955-07-14  REASON FOR VISIT: follow up HISTORY FROM: patient  HISTORY OF PRESENT ILLNESS: Today 10/23/19:  Miranda Berger is a 64 year old female with a history of obstructive sleep apnea on CPAP.  Her download indicates that she use her machine nightly for compliance of 100%.  She used her machine greater than 4 hours each night.  On average she uses her machine 7 hours and 13 minutes.  Her residual AHI is 0.9 on 13 cm of water with EPR of 2.  Reports that the CPAP works well for her.  HISTORY 10/23/18:  Miranda Berger is a 64 year old female with a history of obstructive sleep apnea on CPAP.  Her download indicates that she use her machine 30 out of 30 days for compliance of 100%.  She used her machine greater than 4 hours each night.  On average she uses her machine 8 hours and 57 minutes.  Her residual AHI is 0.8 on 13 cmH2O. she reports that the CPAP continues to work well for her.  She states that she has been dealing with anxiety and depression over the last year.  She states that sometimes she finds it hard to use the mask due to claustrophobia.  Otherwise she is doing well.  She returns today for an evaluation.  REVIEW OF SYSTEMS: Out of a complete 14 system review of symptoms, the patient complains only of the following symptoms, and all other reviewed systems are negative.  FSS 12 ESS 6  ALLERGIES: Allergies  Allergen Reactions  . Codeine Itching  . Prednisone Itching    HOME MEDICATIONS: Outpatient Medications Prior to Visit  Medication Sig Dispense Refill  . acebutolol (SECTRAL) 200 MG capsule Take 1 capsule (200 mg total) by mouth daily. 90 capsule 3  . acetaminophen (TYLENOL) 500 MG tablet Take 1,000 mg by mouth every 6 (six) hours as needed for mild pain or fever.     [name] albuterol (PROVENTIL HFA;VENTOLIN HFA) 108 (90 Base) MCG/ACT inhaler Inhale 2 puffs into the lungs every 6 (six) hours as needed for wheezing or shortness of breath.    .  ALPRAZolam (XANAX) 0.5 MG tablet Take 0.25 mg by mouth daily.     [name] amoxicillin (AMOXIL) 500 MG capsule TAKE 4 CAPSULES BY MOUTH 45 MINUTES BEFORE DENTAL PROCEDURES 12 capsule 2  . aspirin EC 81 MG tablet Take 81 mg by mouth daily.    [name] buPROPion (WELLBUTRIN XL) 300 MG 24 hr tablet Take 1 tablet (300 mg total) by mouth daily. 30 tablet 0  . calcium carbonate (OS-CAL) 600 MG TABS tablet Take 600 mg by mouth daily.     . fluticasone (FLONASE) 50 MCG/ACT nasal spray Place 1 spray into both nostrils daily as needed for allergies.     . Fluticasone-Salmeterol (ADVAIR) 250-50 MCG/DOSE AEPB Inhale 1 puff into the lungs 2 (two) times daily.    . Loratadine (CLARITIN) 10 MG CAPS Take 1 capsule by mouth daily as needed.    . meloxicam (MOBIC) 15 MG tablet Take 1 tablet (15 mg total) by mouth daily. 30 tablet 0  . mirtazapine (REMERON) 15 MG tablet Take 7.5 mg by mouth at bedtime.     . Multiple Vitamin (MULTIVITAMIN) capsule Take 1 capsule by mouth daily.    . risperiDONE (RISPERDAL) 0.25 MG tablet Take 0.25 mg by mouth at bedtime.     No facility-administered medications prior to visit.    PAST MEDICAL HISTORY: Past Medical History:  Diagnosis Date  .  Anemia    in the past   . Anxiety   . Asthma   . Depression   . Family history of adverse reaction to anesthesia    mom had nausea  . GERD (gastroesophageal reflux disease)   . Hepatitis    Hepatitis A in 3rd grade?  [name] Hypertension   . PONV (postoperative nausea and vomiting)   . 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 Sleep apnea    uses CPAP, 12, starts at 6    PAST SURGICAL HISTORY: Past Surgical History:  Procedure Laterality Date  . ABDOMINAL HYSTERECTOMY    . AORTIC VALVE REPLACEMENT N/A 07/26/2017   Procedure: MINIMALLY INVASIVE AORTIC VALVE REPLACEMENT (AVR);  Surgeon: Purcell Nails, MD;  Location: [facility] OR;  Service: Open Heart Surgery;  Laterality:  N/A;  . BLADDER SURGERY  2011  . CARDIAC CATHETERIZATION    . CARPAL TUNNEL RELEASE  2012  . CHOLECYSTECTOMY    . RIGHT/LEFT HEART CATH AND CORONARY ANGIOGRAPHY N/A 07/08/2017   Procedure: RIGHT/LEFT HEART CATH AND CORONARY ANGIOGRAPHY;  Surgeon: Tonny Bollman, MD;  Location: [facility] INVASIVE CV LAB;  Service: Cardiovascular;  Laterality: N/A;  . TEE WITHOUT CARDIOVERSION N/A 07/26/2017   Procedure: TRANSESOPHAGEAL ECHOCARDIOGRAM (TEE);  Surgeon: Purcell Nails, MD;  Location: [facility] OR;  Service: Open Heart Surgery;  Laterality: N/A;  . TONSILLECTOMY      FAMILY HISTORY: Family History  Problem Relation Age of Onset  . Stroke Mother   . Leukemia Mother   . Stroke Father   . Sleep apnea Father   . Valvular heart disease Brother   . Heart Problems Maternal Aunt   . Valvular heart disease Paternal Grandfather   . Valvular heart disease Maternal Aunt     SOCIAL HISTORY: Social History   Socioeconomic History  . Marital status: Married    Spouse name: Not on file  . Number of children: 1  . Years of education: BS  . Highest education level: Not on file  Occupational History  . Not on file  Tobacco Use  . Smoking status: Never Smoker  . Smokeless tobacco: Never Used  Vaping Use  . Vaping Use: Never used  Substance and Sexual Activity  . Alcohol use: No    Alcohol/week: 0.0 standard drinks  . Drug use: No  . Sexual activity: Not on file  Other Topics Concern  . Not on file  Social History Narrative   Occasionally consumes tea or coffee   Social Determinants of Health   Financial Resource Strain:   . Difficulty of Paying Living Expenses: Not on file  Food Insecurity:   . Worried About Programme researcher, broadcasting/film/video in the Last Year: Not on file  . Ran Out of Food in the Last Year: Not on file  Transportation Needs:   . Lack of Transportation (Medical): Not on file  . Lack of Transportation (Non-Medical): Not on file  Physical Activity:   . Days of Exercise per Week: Not on file    . Minutes of Exercise per Session: Not on file  Stress:   . Feeling of Stress : Not on file  Social Connections:   . Frequency of Communication with Friends and Family: Not on file  . Frequency of Social Gatherings with Friends and Family: Not on file  . Attends Religious Services: Not on file  . Active Member of Clubs or Organizations: Not on file  .  Attends Banker Meetings: Not on file  . Marital Status: Not on file  Intimate Partner Violence:   . Fear of Current or Ex-Partner: Not on file  . Emotionally Abused: Not on file  . Physically Abused: Not on file  . Sexually Abused: Not on file      PHYSICAL EXAM  Vitals:   10/23/19 1135  BP: 110/75  Pulse: 71  Weight: 159 lb 9.6 oz (72.4 kg)  Height: 5\' 3"  (1.6 m)   Body mass index is 28.27 kg/m.  Generalized: Well developed, in no acute distress  Chest: Lungs clear to auscultation bilaterally  Neurological examination  Mentation: Alert oriented to time, place, history taking. Follows all commands speech and language fluent Cranial nerve II-XII: Extraocular movements were full, visual field were full on confrontational test Head turning and shoulder shrug  were normal and symmetric. Motor: The motor testing reveals 5 over 5 strength of all 4 extremities. Good symmetric motor tone is noted throughout.  Sensory: Sensory testing is intact to soft touch on all 4 extremities. No evidence of extinction is noted.  Gait and station: Gait is normal.    DIAGNOSTIC DATA (LABS, IMAGING, TESTING) - I reviewed patient records, labs, notes, testing and imaging myself where available.  Lab Results  Component Value Date   WBC 5.9 07/10/2018   HGB 13.1 07/10/2018   HCT 39.4 07/10/2018   MCV 90.4 07/10/2018   PLT 186 07/10/2018      Component Value Date/Time   NA 142 07/10/2018 1733   NA 143 08/09/2017 0850   K 3.8 07/10/2018 1733   CL 105 07/10/2018 1733   CO2 27 07/10/2018 1733   GLUCOSE 118 (H) 07/10/2018 1733    BUN 14 07/10/2018 1733   BUN 14 08/09/2017 0850   CREATININE 0.90 07/10/2018 1733   CALCIUM 9.8 07/10/2018 1733   PROT 7.1 07/10/2018 1733   ALBUMIN 4.7 07/10/2018 1733   AST 17 07/10/2018 1733   ALT 19 07/10/2018 1733   ALKPHOS 53 07/10/2018 1733   BILITOT 0.6 07/10/2018 1733   GFRNONAA >60 07/10/2018 1733   GFRAA >60 07/10/2018 1733   No results found for: CHOL, HDL, LDLCALC, LDLDIRECT, TRIG, CHOLHDL Lab Results  Component Value Date   HGBA1C 5.5 07/22/2017   No results found for: VITAMINB12 Lab Results  Component Value Date   TSH 1.834 07/11/2018      ASSESSMENT AND PLAN 64 y.o. year old female  has a past medical history of Anemia, Anxiety, Asthma, Depression, Family history of adverse reaction to anesthesia, GERD (gastroesophageal reflux disease), Hepatitis, Hypertension, PONV (postoperative nausea and vomiting), 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 (07/26/2017), and Sleep apnea. here with:  1. OSA on CPAP  - CPAP compliance excellent - Good treatment of AHI  - Encourage patient to use CPAP nightly and > 4 hours each night - F/U in 1 year or sooner if needed   I spent 20 minutes of face-to-face and non-face-to-face time with patient.  This included previsit chart review, lab review, study review, order entry, electronic health record documentation, patient education.  09/25/2017, MSN, NP-C 10/23/2019, 11:41 AM [facility] [address] [place], [place] [phone]

## 2019-11-04 NOTE — Progress Notes (Signed)
Cardiology Office Note:    Date:  11/05/2019   ID:  Miranda Berger, Miranda Berger 03-Feb-1956, MRN [id number]  PCP:  Shirlean Mylar, MD  Cardiologist:  Norman Herrlich, MD     Referring MD: Shirlean Mylar, MD    ASSESSMENT:    1. 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 LBBB (left bundle branch block)   3. Essential hypertension   4. Obstructive sleep apnea    PLAN:    In order of problems listed above:  1. She has done well is now more than 2 years remote and completely recovered.  I will plan to see back in 1 year continue low-dose aspirin.  I told her she could take over-the-counter Aleve when she finished Mobic for hip and back pain. 2. Improved normal EKG today 3. At target especially with weight loss reduce beta-blocker to every other day she can take it daily if needed with palpitation stable treated continue CPAP   Next appointment: 1 year   Medication Adjustments/Labs and Tests Ordered: Current medicines are reviewed at length with the patient today.  Concerns regarding medicines are outlined above.  No orders of the defined types were placed in this encounter.  No orders of the defined types were placed in this encounter.   Chief Complaint  Patient presents with  . Follow-up    After bioprosthetic AVR for aortic stenosis 07/26/2017    History of Present Illness:    Miranda Berger is a 63 y.o. female with a hx of stenosis with tissue AVR 07/26/2017, hypertension sleep apnea sinus tachycardia left bundle branch block and hypotension last seen 01/16/2019. Compliance with diet, lifestyle and medications: Yes  Overall she is done well she has had some lightheadedness in order to reduce her beta-blocker to every other day but should take it daily if needed.  In general these individuals take statin but her coronary calcium score was 0.  No chest pain palpitations syncope.  She tells me she is better than good has lost 45 pounds sleep apnea is  treated and resting tachycardia has resolved.  Blood pressure is at target today.  No chest pain shortness of breath edema. Past Medical History:  Diagnosis Date  . Anemia    in the past   . Anxiety   . Asthma   . Depression   . Family history of adverse reaction to anesthesia    mom had nausea  . GERD (gastroesophageal reflux disease)   . Hepatitis    Hepatitis A in 3rd grade?  [name] Hypertension   . PONV (postoperative nausea and vomiting)   . 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 Sleep apnea    uses CPAP, 12, starts at 6    Past Surgical History:  Procedure Laterality Date  . ABDOMINAL HYSTERECTOMY    . AORTIC VALVE REPLACEMENT N/A 07/26/2017   Procedure: MINIMALLY INVASIVE AORTIC VALVE REPLACEMENT (AVR);  Surgeon: Purcell Nails, MD;  Location: [facility] OR;  Service: Open Heart Surgery;  Laterality: N/A;  . BLADDER SURGERY  2011  . CARDIAC CATHETERIZATION    . CARPAL TUNNEL RELEASE  2012  . CHOLECYSTECTOMY    . RIGHT/LEFT HEART CATH AND CORONARY ANGIOGRAPHY N/A 07/08/2017   Procedure: RIGHT/LEFT HEART CATH AND CORONARY ANGIOGRAPHY;  Surgeon: Tonny Bollman, MD;  Location: [facility] INVASIVE CV LAB;  Service: Cardiovascular;  Laterality: N/A;  . TEE WITHOUT CARDIOVERSION N/A 07/26/2017  Procedure: TRANSESOPHAGEAL ECHOCARDIOGRAM (TEE);  Surgeon: Purcell Nails, MD;  Location: [facility] OR;  Service: Open Heart Surgery;  Laterality: N/A;  . TONSILLECTOMY      Current Medications: Current Meds  Medication Sig  . acebutolol (SECTRAL) 200 MG capsule Take 1 capsule (200 mg total) by mouth daily.  [name] acetaminophen (TYLENOL) 500 MG tablet Take 1,000 mg by mouth every 6 (six) hours as needed for mild pain or fever.   [name] albuterol (PROVENTIL HFA;VENTOLIN HFA) 108 (90 Base) MCG/ACT inhaler Inhale 2 puffs into the lungs every 6 (six) hours as needed for wheezing or shortness of breath.  . ALPRAZolam (XANAX) 0.5 MG tablet Take  0.25 mg by mouth in the morning and at bedtime.   [name] aspirin EC 81 MG tablet Take 81 mg by mouth daily.  [name] buPROPion (WELLBUTRIN XL) 300 MG 24 hr tablet Take 1 tablet (300 mg total) by mouth daily.  . calcium carbonate (OS-CAL) 600 MG TABS tablet Take 600 mg by mouth daily.   . fluticasone (FLONASE) 50 MCG/ACT nasal spray Place 1 spray into both nostrils daily as needed for allergies.   . Fluticasone-Salmeterol (ADVAIR) 250-50 MCG/DOSE AEPB Inhale 1 puff into the lungs 2 (two) times daily.  . Loratadine (CLARITIN) 10 MG CAPS Take 1 capsule by mouth daily as needed.  . meloxicam (MOBIC) 15 MG tablet Take 1 tablet (15 mg total) by mouth daily.  . mirtazapine (REMERON) 15 MG tablet Take 7.5 mg by mouth at bedtime.   . Multiple Vitamin (MULTIVITAMIN) capsule Take 1 capsule by mouth daily.     Allergies:   Codeine and Prednisone   Social History   Socioeconomic History  . Marital status: Married    Spouse name: Not on file  . Number of children: 1  . Years of education: BS  . Highest education level: Not on file  Occupational History  . Not on file  Tobacco Use  . Smoking status: Never Smoker  . Smokeless tobacco: Never Used  Vaping Use  . Vaping Use: Never used  Substance and Sexual Activity  . Alcohol use: No    Alcohol/week: 0.0 standard drinks  . Drug use: No  . Sexual activity: Not on file  Other Topics Concern  . Not on file  Social History Narrative   Occasionally consumes tea or coffee   Social Determinants of Health   Financial Resource Strain:   . Difficulty of Paying Living Expenses: Not on file  Food Insecurity:   . Worried About Programme researcher, broadcasting/film/video in the Last Year: Not on file  . Ran Out of Food in the Last Year: Not on file  Transportation Needs:   . Lack of Transportation (Medical): Not on file  . Lack of Transportation (Non-Medical): Not on file  Physical Activity:   . Days of Exercise per Week: Not on file  . Minutes of Exercise per Session: Not on file   Stress:   . Feeling of Stress : Not on file  Social Connections:   . Frequency of Communication with Friends and Family: Not on file  . Frequency of Social Gatherings with Friends and Family: Not on file  . Attends Religious Services: Not on file  . Active Member of Clubs or Organizations: Not on file  . Attends Banker Meetings: Not on file  . Marital Status: Not on file     Family History: The patient's family history includes Heart Problems in her maternal aunt; Leukemia in her mother;  Sleep apnea in her father; Stroke in her father and mother; Valvular heart disease in her brother, maternal aunt, and paternal grandfather. ROS:   Please see the history of present illness.    All other systems reviewed and are negative.  EKGs/Labs/Other Studies Reviewed:    The following studies were reviewed today:  EKG:  EKG ordered today and personally reviewed.  The ekg ordered today demonstrates sinus rhythm and is normal sinus tachycardia has resolved  Recent Labs: Requested from her [facility] medicine PCP  Physical Exam:    VS:  BP 110/72   Pulse 74   Ht 5\' 3"  (1.6 m)   Wt 159 lb 1.3 oz (72.2 kg)   SpO2 97%   BMI 28.18 kg/m     Wt Readings from Last 3 Encounters:  11/05/19 159 lb 1.3 oz (72.2 kg)  10/23/19 159 lb 9.6 oz (72.4 kg)  01/16/19 178 lb (80.7 kg)     GEN:  Well nourished, well developed in no acute distress HEENT: Normal NECK: No JVD; No carotid bruits LYMPHATICS: No lymphadenopathy CARDIAC: RRR, no murmurs, rubs, gallops RESPIRATORY:  Clear to auscultation without rales, wheezing or rhonchi  ABDOMEN: Soft, non-tender, non-distended MUSCULOSKELETAL:  No edema; No deformity  SKIN: Warm and dry NEUROLOGIC:  Alert and oriented x 3 PSYCHIATRIC:  Normal affect    Signed, 01/18/19, MD  11/05/2019 9:21 AM    [facility]

## 2019-11-05 ENCOUNTER — Encounter: Payer: Self-pay | Admitting: Cardiology

## 2019-11-05 ENCOUNTER — Other Ambulatory Visit: Payer: Self-pay

## 2019-11-05 ENCOUNTER — Ambulatory Visit: Payer: BC Managed Care – PPO | Admitting: Cardiology

## 2019-11-05 VITALS — BP 110/72 | HR 74 | Ht 63.0 in | Wt 159.1 lb

## 2019-11-05 DIAGNOSIS — I10 Essential (primary) hypertension: Secondary | ICD-10-CM

## 2019-11-05 DIAGNOSIS — G4733 Obstructive sleep apnea (adult) (pediatric): Secondary | ICD-10-CM

## 2019-11-05 DIAGNOSIS — I447 Left bundle-branch block, unspecified: Secondary | ICD-10-CM | POA: Diagnosis not present

## 2019-11-05 DIAGNOSIS — Z953 Presence of xenogenic heart valve: Secondary | ICD-10-CM | POA: Diagnosis not present

## 2019-11-05 MED ORDER — ACEBUTOLOL HCL 200 MG PO CAPS: 200.0000 mg | ORAL_CAPSULE | ORAL | 3 refills | Status: DC

## 2019-11-05 NOTE — Patient Instructions (Signed)
Medication Instructions:  Your physician has recommended you make the following change in your medication:  DECREASE: Acebutolol 200 mg take one tablet by mouth every other day *If you need a refill on your cardiac medications before your next appointment, please call your pharmacy*   Lab Work: None If you have labs (blood work) drawn today and your tests are completely normal, you will receive your results only by: [name] MyChart Message (if you have MyChart) OR . A paper copy in the mail If you have any lab test that is abnormal or we need to change your treatment, we will call you to review the results.   Testing/Procedures: None   Follow-Up: At [facility], you and your health needs are our priority.  As part of our continuing mission to provide you with exceptional heart care, we have created designated Provider Care Teams.  These Care Teams include your primary Cardiologist (physician) and Advanced Practice Providers (APPs -  Physician Assistants and Nurse Practitioners) who all work together to provide you with the care you need, when you need it.  We recommend signing up for the patient portal called "MyChart".  Sign up information is provided on this After Visit Summary.  MyChart is used to connect with patients for Virtual Visits (Telemedicine).  Patients are able to view lab/test results, encounter notes, upcoming appointments, etc.  Non-urgent messages can be sent to your provider as well.   To learn more about what you can do with MyChart, go to [email or web address].    Your next appointment:   1 year(s)  The format for your next appointment:   In Person  Provider:   Norman Herrlich, MD   Other Instructions

## 2019-11-07 ENCOUNTER — Other Ambulatory Visit: Payer: Self-pay

## 2019-11-07 ENCOUNTER — Ambulatory Visit: Payer: Self-pay

## 2019-11-07 ENCOUNTER — Ambulatory Visit: Payer: BC Managed Care – PPO | Admitting: Orthopedic Surgery

## 2019-11-07 DIAGNOSIS — M25551 Pain in right hip: Secondary | ICD-10-CM

## 2019-11-07 DIAGNOSIS — M5440 Lumbago with sciatica, unspecified side: Secondary | ICD-10-CM

## 2019-11-07 DIAGNOSIS — M25552 Pain in left hip: Secondary | ICD-10-CM | POA: Diagnosis not present

## 2019-11-12 ENCOUNTER — Encounter: Payer: Self-pay | Admitting: Orthopedic Surgery

## 2019-11-12 NOTE — Progress Notes (Signed)
Office Visit Note   Patient: Miranda Berger           Date of Birth: [birth year]           MRN: [id number] Visit Date: 11/07/2019 Requested by: Shirlean Mylar, MD [address] [place],  [place] [address] PCP: Shirlean Mylar, MD  Subjective: Chief Complaint  Patient presents with  . Lower Back - Pain  . Right Hip - Pain  . Left Hip - Pain    HPI: Miranda Berger is a 64 y.o. female who presents to the office complaining of bilateral hip and back pain.  Patient complains of left and right trochanteric pain.  Right pain greater than left pain.  She notes pain has been ongoing and is worse with walking, sitting, sleeping on either side.  She had a prior left trochanteric injection that gave relief but only lasted for 2 to 3 days.  She had good relief during this 2 to 3 days.  She has constant pain.  Denies any groin pain.  She notes that she walks with a limp at times.  She is very discouraged because it is keeping her from exercising and she had been losing 35 pounds before her pain flared up.  Pain seems to be worsening.  Additionally, she complains of low back pain with radiation into to the buttocks.  She was seen Dr. Alvester Morin and had an epidural steroid injection that targeted her sacralized L5 segment.  She notes this epidural steroid injection provided no relief at all..                ROS: All systems reviewed are negative as they relate to the chief complaint within the history of present illness.  Patient denies fevers or chills.  Assessment & Plan: Visit Diagnoses:  1. Greater trochanteric pain syndrome of both lower extremities   2. Right hip pain   3. Bilateral low back pain with sciatica, sciatica laterality unspecified, unspecified chronicity     Plan: Patient is a 64 year old female who presents complaining of bilateral hip pain and low back pain with radiation into her buttocks.  Pain is been worsening over the last several months.  She has had previous  epidural steroid injection targeted into her sacralized L5 segment that was identified on MRI scan.  This provided no relief.  Plan to refer back to Dr. Alvester Morin for an injection in a different location, per his expertise.  Additionally, ordered MRI of the pelvis for further evaluation of bilateral lateral hip trochanteric pain.  No groin pain that she is complaining of or that can be elicited on physical exam.  No significant degenerative changes noted of the bilateral hip joints on radiographs that were taken today.  Follow-up after MRI to review results.  Patient agreed with plan.  Follow-Up Instructions: No follow-ups on file.   Orders:  Orders Placed This Encounter  Procedures  . DG HIP UNILAT WITH PELVIS 2-3 VIEWS RIGHT  . XR HIP UNILAT W OR W/O PELVIS 2-3 VIEWS RIGHT  . MR Pelvis w/o contrast  . Ambulatory referral to Physical Medicine Rehab   No orders of the defined types were placed in this encounter.     Procedures: No procedures performed   Clinical Data: No additional findings.  Objective: Vital Signs: There were no vitals taken for this visit.  Physical Exam:  Constitutional: Patient appears well-developed HEENT:  Head: Normocephalic Eyes:EOM are normal Neck: Normal range of motion Cardiovascular: Normal rate  Pulmonary/chest: Effort normal Neurologic: Patient is alert Skin: Skin is warm Psychiatric: Patient has normal mood and affect  Ortho Exam: Ortho exam demonstrates tenderness over the bilateral trochanteric bursa regions.  No pain with internal rotation/external rotation of the bilateral hip joints.  Negative Stinchfield exams bilaterally.  Negative straight leg raise bilaterally.  Tenderness over the lower axial lumbar spine and right-sided paraspinal musculature.  +5 motor strength of bilateral hip flexors, quadriceps, hamstring, dorsiflexion, plantarflexion.  No loss of sensation throughout the bilateral lower extremity dermatomes.  No sign of clonus.  No  Trendelenburg gait noted.  Specialty Comments:  No specialty comments available.  Imaging: No results found.   PMFS History: Patient Active Problem List   Diagnosis Date Noted  . 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 Costochondral chest pain 04/04/2018  . Pericarditis 09/12/2017  . LBBB (left bundle branch block) 08/08/2017  . Obstructive sleep apnea   . Anxiety   . Asthma   . 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 07/26/2017  . Hypertension 06/21/2017  . Aortic stenosis 06/20/2017  . Degenerative tear of medial meniscus of right knee 04/21/2016  . High-tone pelvic floor dysfunction 12/26/2013  . Difficult or painful urination 06/22/2012  . Chronic interstitial cystitis 06/22/2012  . Dyspareunia 06/22/2012  . Female pelvic pain 06/22/2012  . Urinary urgency 06/22/2012   Past Medical History:  Diagnosis Date  . Anemia    in the past   . Anxiety   . Asthma   . Depression   . Family history of adverse reaction to anesthesia    mom had nausea  . GERD (gastroesophageal reflux disease)   . Hepatitis    Hepatitis A in 3rd grade?  [name] Hypertension   . PONV (postoperative nausea and vomiting)   . 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 Sleep apnea    uses CPAP, 12, starts at 6    Family History  Problem Relation Age of Onset  . Stroke Mother   . Leukemia Mother   . Stroke Father   . Sleep apnea Father   . Valvular heart disease Brother   . Heart Problems Maternal Aunt   . Valvular heart disease Paternal Grandfather   . Valvular heart disease Maternal Aunt     Past Surgical History:  Procedure Laterality Date  . ABDOMINAL HYSTERECTOMY    . AORTIC VALVE REPLACEMENT N/A 07/26/2017   Procedure: MINIMALLY INVASIVE AORTIC  VALVE REPLACEMENT (AVR);  Surgeon: Purcell Nails, MD;  Location: [facility] OR;  Service: Open Heart Surgery;  Laterality: N/A;  . BLADDER SURGERY  2011  . CARDIAC CATHETERIZATION    . CARPAL TUNNEL RELEASE  2012  . CHOLECYSTECTOMY    . RIGHT/LEFT HEART CATH AND CORONARY ANGIOGRAPHY N/A 07/08/2017   Procedure: RIGHT/LEFT HEART CATH AND CORONARY ANGIOGRAPHY;  Surgeon: Tonny Bollman, MD;  Location: [facility] INVASIVE CV LAB;  Service: Cardiovascular;  Laterality: N/A;  . TEE WITHOUT CARDIOVERSION N/A 07/26/2017   Procedure: TRANSESOPHAGEAL ECHOCARDIOGRAM (TEE);  Surgeon: Purcell Nails, MD;  Location: [facility] OR;  Service: Open Heart Surgery;  Laterality: N/A;  . TONSILLECTOMY     Social History   Occupational History  . Not on file  Tobacco Use  . Smoking status: Never Smoker  . Smokeless tobacco: Never Used  Vaping Use  . Vaping Use:  Never used  Substance and Sexual Activity  . Alcohol use: No    Alcohol/week: 0.0 standard drinks  . Drug use: No  . Sexual activity: Not on file

## 2019-11-22 IMAGING — CT CT HEART SCORING
2 series · 16 of 20 positions shown, 18 images · non-contrast
Comparison: Chest CT 06/24/2010

ADDENDUM:
Aortic valve calcium score is 7233.

Seanxian Petsai
EXAM:
OVER-READ INTERPRETATION  CT CHEST
The following report is an over-read performed by radiologist Dr.
over-read does not include interpretation of cardiac or coronary
anatomy or pathology. The coronary calcium score interpretation by
the cardiologist is attached.
CLINICAL DATA: Risk stratification
Coronary Calcium Score
MEDICATIONS:
None.
TECHNIQUE: The patient was scanned on a Siemens Force scanner. Axial
non-contrast 3 mm slices were carried out through the heart. The
data set was analyzed on a dedicated work station and scored using
the Agatson method.

[Series 3: casc 3.0 i36f 2 bestdiast 66 % · axial · 0.33mm/px · z∈[-205,-121]mm · 8 of 37 slices shown, 10 images]
[im 5/37  vessel]
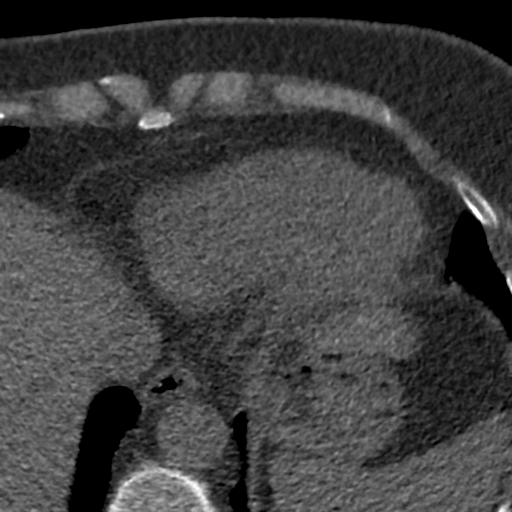
[im 5/37  lung]
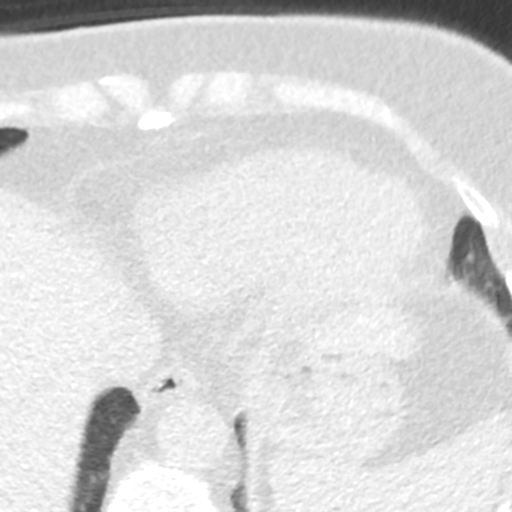
[im 9/37  vessel]
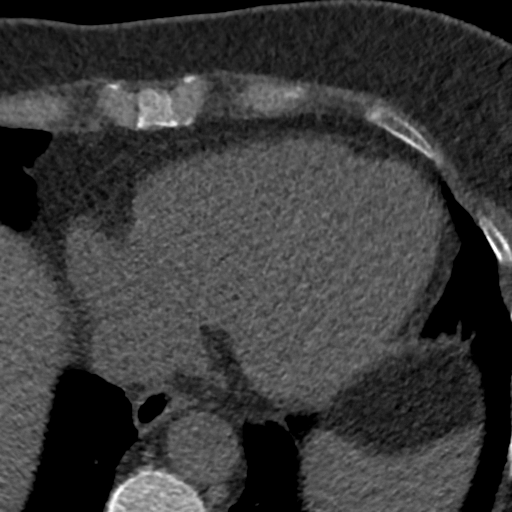
[im 13/37  vessel]
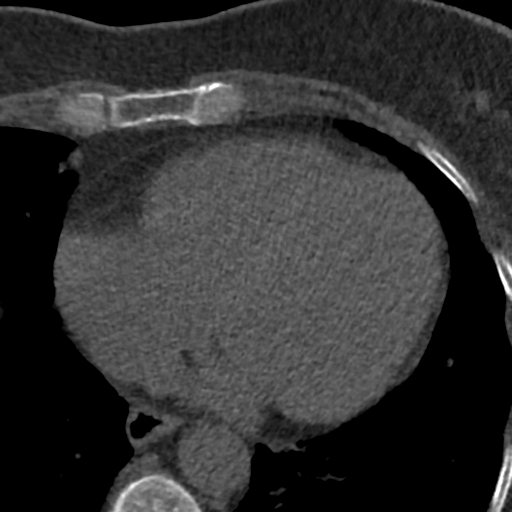
[im 17/37  vessel]
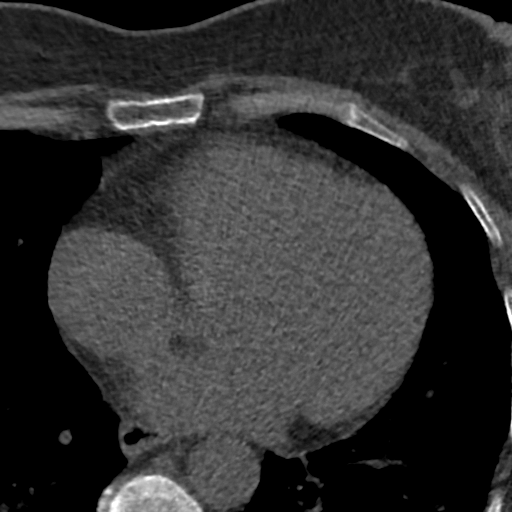
[im 21/37  vessel]
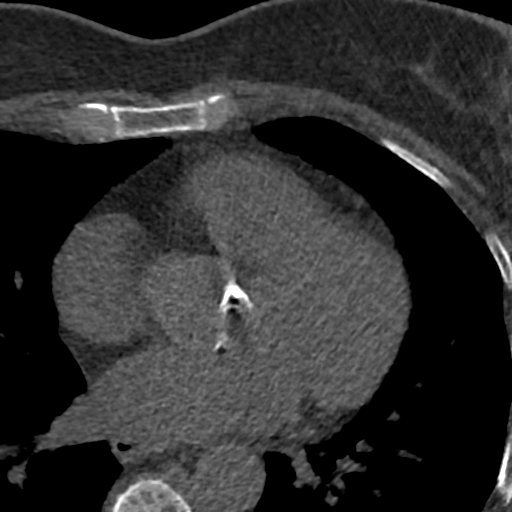
[im 21/37  lung]
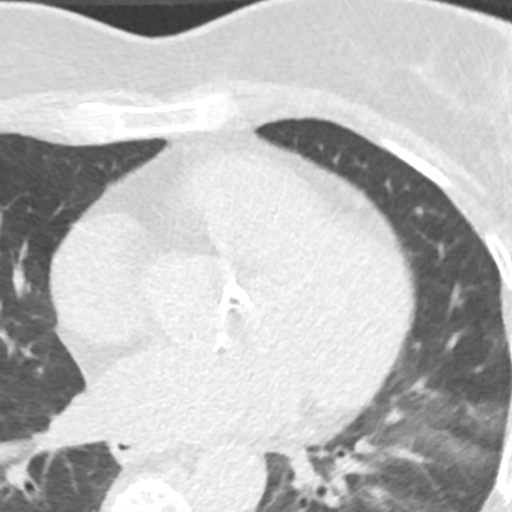
[im 25/37  vessel]
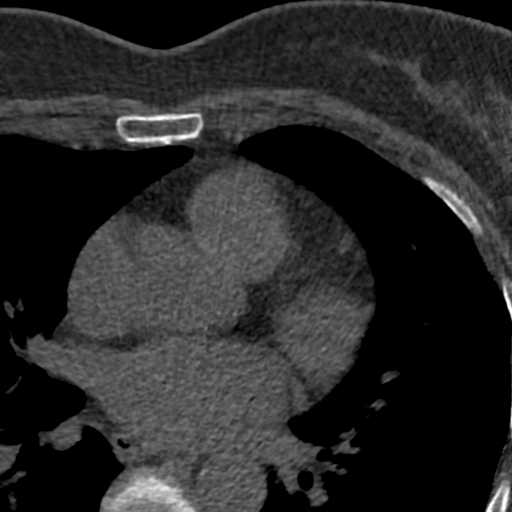
[im 29/37  vessel]
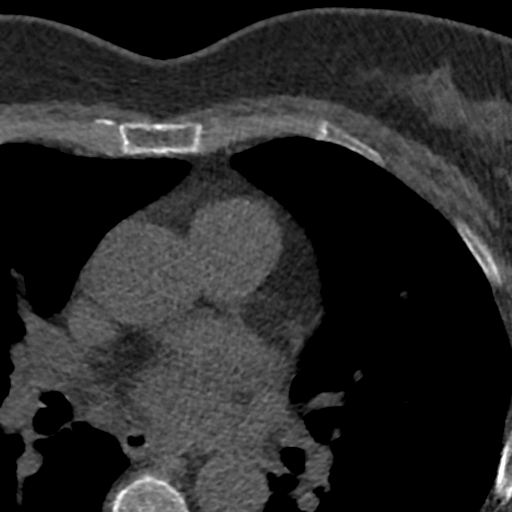
[im 33/37  vessel]
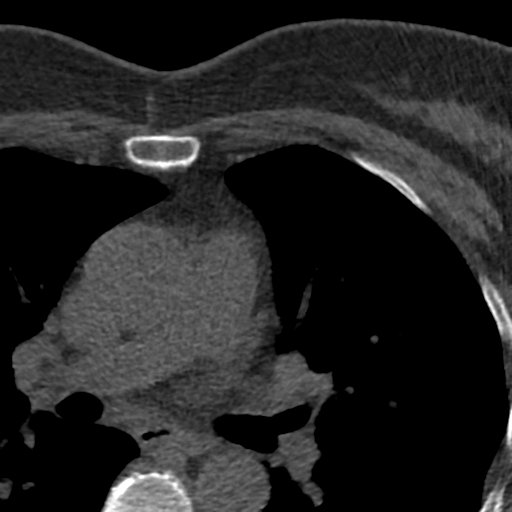

[Series 5: lung st 68 % · axial · 0.61mm/px · z∈[-208,-124]mm · 8 of 38 slices shown]
[im 5/38  lung]
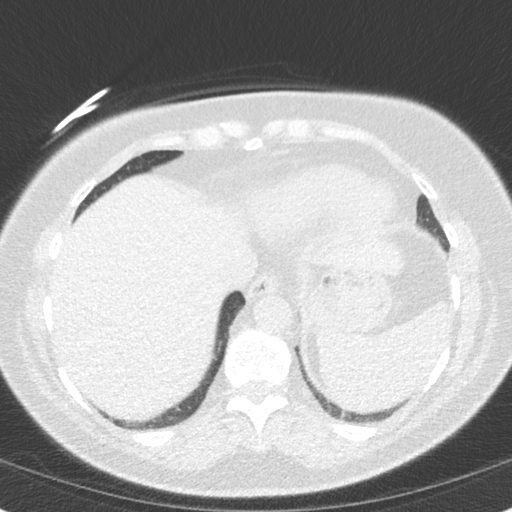
[im 9/38  lung]
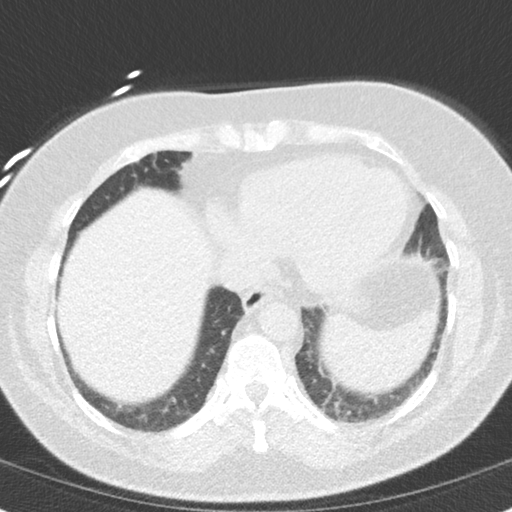
[im 13/38  lung]
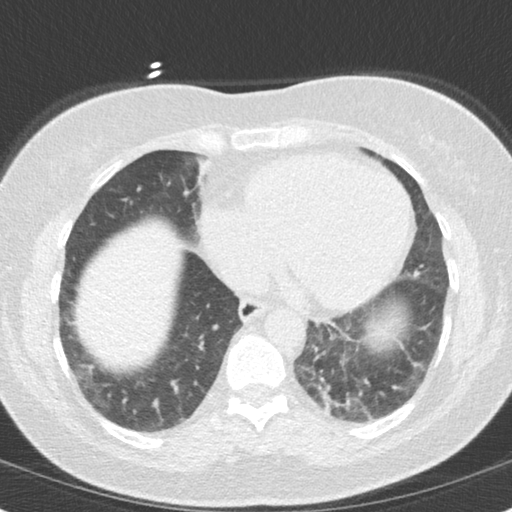
[im 17/38  lung]
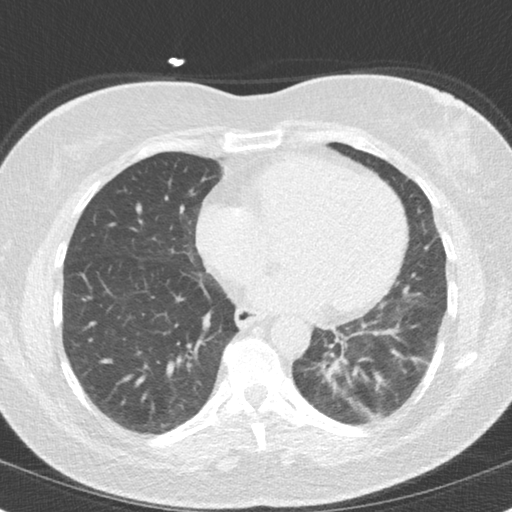
[im 21/38  lung]
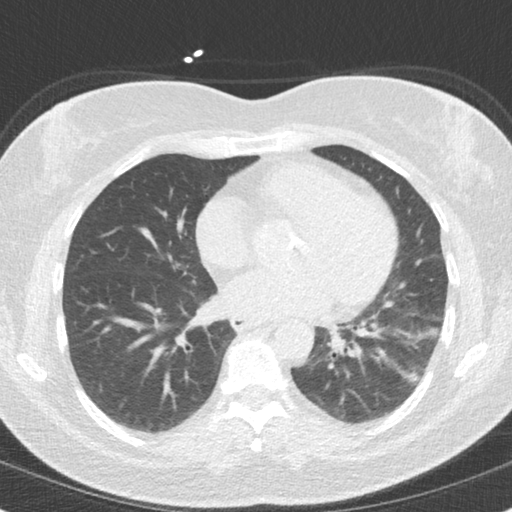
[im 25/38  lung]
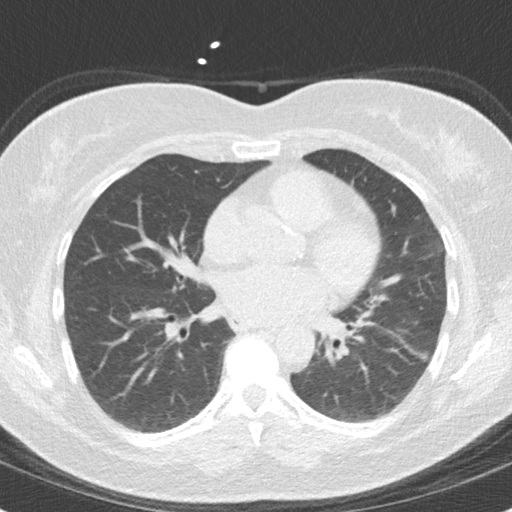
[im 29/38  lung]
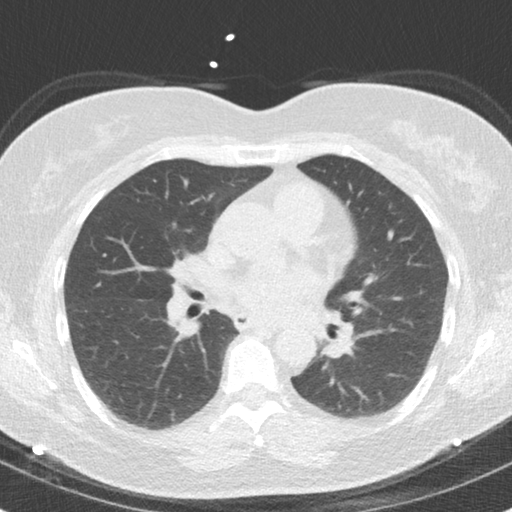
[im 33/38  lung]
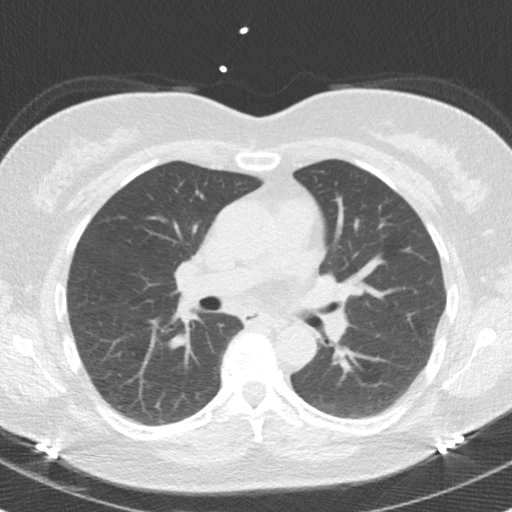

[16 of 20 positions shown; findings below may reference images not displayed]

FINDINGS: Linear scarring or subsegmental atelectasis noted throughout
visualized portions of the left lower lobe and posterior aspect of
the left upper lobe. Within the visualized portions of the thorax
there are no suspicious appearing pulmonary nodules or masses, there
is no acute consolidative airspace disease, no pleural effusions, no
pneumothorax and no lymphadenopathy. Visualized portions of the
upper abdomen are unremarkable. There are no aggressive appearing
lytic or blastic lesions noted in the visualized portions of the
skeleton.
IMPRESSION: 1. Areas of mild scarring and/or subsegmental atelectasis are noted
in the visualized portions of the left lung, new compared to prior
study from 0780.
FINDINGS: Non-cardiac: See separate report from [REDACTED].

Ascending Aorta: Normal size, mild to moderate calcifications in the
aortic root.

Pericardium: Normal.

Coronary arteries: Normal origin.
IMPRESSION: Coronary calcium score of 0. This was 0 percentile for age and sex
matched control.

## 2019-11-26 ENCOUNTER — Ambulatory Visit
Admission: RE | Admit: 2019-11-26 | Discharge: 2019-11-26 | Disposition: A | Discharge status: Home / Self Care | Payer: BC Managed Care – PPO | Source: Ambulatory Visit | Attending: Orthopedic Surgery | Admitting: Orthopedic Surgery

## 2019-11-26 ENCOUNTER — Other Ambulatory Visit: Payer: Self-pay

## 2019-11-26 DIAGNOSIS — M25551 Pain in right hip: Secondary | ICD-10-CM

## 2019-11-28 ENCOUNTER — Encounter: Payer: Self-pay | Admitting: Physical Medicine and Rehabilitation

## 2019-11-28 ENCOUNTER — Other Ambulatory Visit: Payer: Self-pay

## 2019-11-28 ENCOUNTER — Ambulatory Visit: Payer: Self-pay

## 2019-11-28 ENCOUNTER — Ambulatory Visit: Payer: BC Managed Care – PPO | Admitting: Physical Medicine and Rehabilitation

## 2019-11-28 VITALS — BP 124/80 | HR 86

## 2019-11-28 DIAGNOSIS — M47816 Spondylosis without myelopathy or radiculopathy, lumbar region: Secondary | ICD-10-CM

## 2019-11-28 MED ORDER — METHYLPREDNISOLONE ACETATE 80 MG/ML IJ SUSP
80.0000 mg | Freq: Once | INTRAMUSCULAR | Status: AC
Start: 2019-11-28 — End: 2019-11-28
  Administered 2019-11-28: 80 mg

## 2019-11-28 NOTE — Progress Notes (Signed)
Pt state lower back pain mostly the right side pain. Pt state laying down to go to bed makes the pain worse. Pt state pain meds and heating pad helps ease the pain. Pt has hx on inj on 08/20/19 Pt state it helped for a few days. Pt state it was tinder most of the time.  Numeric Pain Rating Scale and Functional Assessment Average Pain 8   In the last MONTH (on 0-10 scale) has pain interfered with the following?  1. General activity like being  able to carry out your everyday physical activities such as walking, climbing stairs, carrying groceries, or moving a chair?  Rating(10)   +Driver, -BT, -Dye Allergies.

## 2019-12-05 ENCOUNTER — Ambulatory Visit: Payer: BC Managed Care – PPO | Admitting: Orthopedic Surgery

## 2019-12-05 ENCOUNTER — Encounter: Payer: Self-pay | Admitting: Orthopedic Surgery

## 2019-12-05 DIAGNOSIS — M25552 Pain in left hip: Secondary | ICD-10-CM | POA: Diagnosis not present

## 2019-12-05 DIAGNOSIS — M25551 Pain in right hip: Secondary | ICD-10-CM

## 2019-12-05 MED ORDER — METHYLPREDNISOLONE ACETATE 80 MG/ML IJ SUSP
80.0000 mg | INTRAMUSCULAR | Status: AC | PRN
  Administered 2019-12-05: 80 mg via INTRA_ARTICULAR

## 2019-12-05 MED ORDER — BUPIVACAINE HCL 0.25 % IJ SOLN
4.0000 mL | INTRAMUSCULAR | Status: AC | PRN
  Administered 2019-12-05: 4 mL via INTRA_ARTICULAR

## 2019-12-05 MED ORDER — LIDOCAINE HCL 1 % IJ SOLN
5.0000 mL | INTRAMUSCULAR | Status: AC | PRN
  Administered 2019-12-05: 5 mL

## 2019-12-05 NOTE — Progress Notes (Signed)
Office Visit Note   Patient: Miranda Berger           Date of Birth: 08-03-1955           MRN: [id number] Visit Date: 12/05/2019 Requested by: Shirlean Mylar, MD [address] [place],  [place] [address] PCP: Shirlean Mylar, MD  Subjective: Chief Complaint  Patient presents with  . scan review    HPI: Chanti is a 64 year old patient with bilateral hip pain.  She had an injection with Dr. Berlin Hun in her back which helped significantly.  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  She cannot take anti-inflammatories.  Localizes pain both in the sciatic notch region on the left-hand side as well as in the trochanteric region on the left.              ROS: All systems reviewed are negative as they relate to the chief complaint within the history of present illness.  Patient denies  fevers or chills.   Assessment & Plan: Visit Diagnoses:  1. Greater trochanteric pain syndrome of both lower extremities     Plan: Impression is left hip pain with possible component of trochanteric bursitis.  Gluteus medius tendinosis is present but it is mild on both sides.  Nothing else operative in the pelvic MRI scan review.  I think most of her symptoms are likely coming from her back.  Ultrasound-guided injection performed on the left-hand side today.  It has helped the right side before.  Follow-up as needed.  Follow-Up Instructions: Return if symptoms worsen or fail to improve.   Orders:  No orders of the defined types were placed in this encounter.  No orders of the defined types were placed in this encounter.     Procedures: Large Joint Inj: L greater trochanter on 12/05/2019 11:32 PM Indications: pain and diagnostic evaluation Details: 18 G 3.5 in needle, ultrasound-guided lateral approach  Arthrogram: No  Medications: 5 mL lidocaine 1 %; 80 mg methylPREDNISolone acetate 80 MG/ML; 4 mL bupivacaine 0.25 % Outcome: tolerated well, no  immediate complications Procedure, treatment alternatives, risks and benefits explained, specific risks discussed. Consent was given by the patient. Immediately prior to procedure a time out was called to verify the correct patient, procedure, equipment, support staff and site/side marked as required. Patient was prepped and draped in the usual sterile fashion.       Clinical Data: No additional findings.  Objective: Vital Signs: There were no vitals taken for this visit.  Physical Exam:   Constitutional: Patient appears well-developed HEENT:  Head: Normocephalic Eyes:EOM are normal Neck: Normal range of motion Cardiovascular: Normal rate Pulmonary/chest: Effort normal Neurologic: Patient is alert Skin: Skin is warm Psychiatric: Patient has normal mood and affect    Ortho Exam: Ortho exam demonstrates full active and passive range of motion of both hips.  She does have trochanteric tenderness on the left more than the right.  Pedal pulses palpable.  No masses lymphadenopathy or skin changes noted in that hip region.  Mild pain with forward lateral bending.  No groin pain with internal extra rotation of the leg.  Specialty Comments:  No specialty comments available.  Imaging: No results found.   PMFS History: Patient Active Problem List   Diagnosis Date Noted  . 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 Costochondral chest pain 04/04/2018  .  Pericarditis 09/12/2017  . LBBB (left bundle branch block) 08/08/2017  . Obstructive sleep apnea   . Anxiety   . Asthma   . 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 07/26/2017  . Hypertension 06/21/2017  . Aortic stenosis 06/20/2017  . Degenerative tear of medial meniscus of right knee 04/21/2016  . High-tone pelvic floor dysfunction 12/26/2013  . Difficult or  painful urination 06/22/2012  . Chronic interstitial cystitis 06/22/2012  . Dyspareunia 06/22/2012  . Female pelvic pain 06/22/2012  . Urinary urgency 06/22/2012   Past Medical History:  Diagnosis Date  . Anemia    in the past   . Anxiety   . Asthma   . Depression   . Family history of adverse reaction to anesthesia    mom had nausea  . GERD (gastroesophageal reflux disease)   . Hepatitis    Hepatitis A in 3rd grade?  [name] Hypertension   . PONV (postoperative nausea and vomiting)   . 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 Sleep apnea    uses CPAP, 12, starts at 6    Family History  Problem Relation Age of Onset  . Stroke Mother   . Leukemia Mother   . Stroke Father   . Sleep apnea Father   . Valvular heart disease Brother   . Heart Problems Maternal Aunt   . Valvular heart disease Paternal Grandfather   . Valvular heart disease Maternal Aunt     Past Surgical History:  Procedure Laterality Date  . ABDOMINAL HYSTERECTOMY    . AORTIC VALVE REPLACEMENT N/A 07/26/2017   Procedure: MINIMALLY INVASIVE AORTIC VALVE REPLACEMENT (AVR);  Surgeon: Purcell Nails, MD;  Location: [facility] OR;  Service: Open Heart Surgery;  Laterality: N/A;  . BLADDER SURGERY  2011  . CARDIAC CATHETERIZATION    . CARPAL TUNNEL RELEASE  2012  . CHOLECYSTECTOMY    . RIGHT/LEFT HEART CATH AND CORONARY ANGIOGRAPHY N/A 07/08/2017   Procedure: RIGHT/LEFT HEART CATH AND CORONARY ANGIOGRAPHY;  Surgeon: Tonny Bollman, MD;  Location: [facility] INVASIVE CV LAB;  Service: Cardiovascular;  Laterality: N/A;  . TEE WITHOUT CARDIOVERSION N/A 07/26/2017   Procedure: TRANSESOPHAGEAL ECHOCARDIOGRAM (TEE);  Surgeon: Purcell Nails, MD;  Location: [facility] OR;  Service: Open Heart Surgery;  Laterality: N/A;  . TONSILLECTOMY     Social History   Occupational History  . Not on file  Tobacco Use  . Smoking status: Never Smoker  . Smokeless tobacco: Never  Used  Vaping Use  . Vaping Use: Never used  Substance and Sexual Activity  . Alcohol use: No    Alcohol/week: 0.0 standard drinks  . Drug use: No  . Sexual activity: Not on file

## 2019-12-30 NOTE — Progress Notes (Signed)
Miranda Berger - 64 y.o. female MRN [id number]  Date of birth: 03-12-1955  Office Visit Note: Visit Date: 11/28/2019 PCP: Shirlean Mylar, MD Referred by: Shirlean Mylar, MD  Subjective: Chief Complaint  Patient presents with  . Lower Back - Pain   HPI:  Miranda Berger is a 64 y.o. female who comes in today for planned repeat Bilateral L4-L5 Lumbar facet/medial branch block with fluoroscopic guidance.  The patient has failed conservative care including home exercise, medications, time and activity modification.  This injection will be diagnostic and hopefully therapeutic.  Please see requesting physician notes for further details and justification.  Exam shows concordant low back pain with facet joint loading and extension. Patient received more than 80% pain relief from prior injection. This would be the second block in a diagnostic double block paradigm.   Patient with noted 8 out of 10 back pain right more than left but bilateral.  She has sacralized L5 segment.  Referring:Dr. Marrianne Mood Dean   ROS Otherwise per HPI.  Assessment & Plan: Visit Diagnoses:  1. Spondylosis without myelopathy or radiculopathy, lumbar region     Plan: No additional findings.   Meds & Orders:  Meds ordered this encounter  Medications  . methylPREDNISolone acetate (DEPO-MEDROL) injection 80 mg    Orders Placed This Encounter  Procedures  . Facet Injection  . XR C-ARM NO REPORT    Follow-up: Return for Review Pain Diary.   Procedures: No procedures performed  Lumbar Diagnostic Facet Joint Nerve Block with Fluoroscopic Guidance   Patient: Miranda Berger      Date of Birth: Jul 10, 1955 MRN: [id number] PCP: Shirlean Mylar, MD      Visit Date: 11/28/2019   Universal Protocol:    Date/Time: 11/07/212:52 PM  Consent Given By: the patient  Position: PRONE  Additional Comments: Vital signs were monitored before and after the procedure. Patient was prepped and draped in the usual sterile  fashion. The correct patient, procedure, and site was verified.   Injection Procedure Details:   Procedure diagnoses:  1. Spondylosis without myelopathy or radiculopathy, lumbar region      Meds Administered:  Meds ordered this encounter  Medications  . methylPREDNISolone acetate (DEPO-MEDROL) injection 80 mg     Laterality: Bilateral  Location/Site: Sacralized L5 segment L4-L5  Needle size: 22 ga.  Needle type:spinal  Needle Placement: Oblique pedical  Findings:   -Comments: There was excellent flow of contrast along the articular pillars without intravascular flow.   Procedure Details: The fluoroscope beam is vertically oriented in AP and then obliqued 15 to 20 degrees to the ipsilateral side of the desired nerve to achieve the "Scotty dog" appearance.  The skin over the target area of the junction of the superior articulating process and the transverse process (sacral ala if blocking the L5 dorsal rami) was locally anesthetized with a 1 ml volume of 1% Lidocaine without Epinephrine.  The spinal needle was inserted and advanced in a trajectory view down to the target.   After contact with periosteum and negative aspirate for blood and CSF, correct placement without intravascular or epidural spread was confirmed by injecting 0.5 ml. of Isovue-250.  A spot radiograph was obtained of this image.    Next, a 0.5 ml. volume of the injectate described above was injected. The needle was then redirected to the other facet joint nerves mentioned above if needed.  Prior to the procedure, the patient was given a Pain Diary which was completed for baseline measurements.  After the procedure, the patient rated their pain every 30 minutes and will continue rating at this frequency for a total of 5 hours.  The patient has been asked to complete the Diary and return to [place] by mail, fax or hand delivered as soon as possible.   Additional Comments:  The patient tolerated the procedure  well Dressing: 2 x 2 sterile gauze and Band-Aid    Post-procedure details: Patient was observed during the procedure. Post-procedure instructions were reviewed.  Patient left the clinic in stable condition.    Clinical History: MRI LUMBAR SPINE WITHOUT CONTRAST  TECHNIQUE: Multiplanar, multisequence MR imaging of the lumbar spine was performed. No intravenous contrast was administered.  COMPARISON:  Lumbar radiographs dated 05/30/2019 and lumbar MRI dated 09/21/2016 and CT scans of the chest abdomen and pelvis dated 07/20/2017  FINDINGS: Segmentation: There are 12 rib-bearing vertebra and only 4 non-rib-bearing vertebra in the lumbar region. 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 There is a vestigial disc at L5-S1.  Alignment:  Physiologic.  Vertebrae:  No fracture, evidence of discitis, or bone lesion.  Conus medullaris and cauda equina: Conus extends to the L1-2 level. Conus and cauda equina appear normal.  Paraspinal and other soft tissues: Negative.  Disc levels:  T9-10 through L1-2: Normal.  L2-3: Tiny disc bulge into the right neural foramen with no neural impingement. Minimal hypertrophy of the ligamentum flavum with no spinal or foraminal stenosis. Otherwise normal.  L3-4: Tiny disc bulge into the right neural foramen with no neural impingement minimal degenerative changes of the right facet joint with minimal ligamentum flavum hypertrophy, right greater than left. No spinal or foraminal stenosis or neural impingement.  L4-5: Normal disc moderate left and minimal right facet arthritis. No foraminal or spinal stenosis.  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  IMPRESSION: 1. 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 2. 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 3. Minimal degenerative disc disease at L2-3 and L2-3 without  neural impingement.   Electronically Signed   By: Francene Boyers M.D.   On: 06/27/2019 11:47     Objective:  VS:  HT:    WT:   BMI:     BP:124/80  HR:86bpm  TEMP: ( )  RESP:  Physical Exam Constitutional:      General: She is not in acute distress.    Appearance: Normal appearance. She is not ill-appearing.  HENT:     Head: Normocephalic and atraumatic.     Right Ear: External ear normal.     Left Ear: External ear normal.  Eyes:     Extraocular Movements: Extraocular movements intact.  Cardiovascular:     Rate and Rhythm: Normal rate.     Pulses: Normal pulses.  Musculoskeletal:     Right lower leg: No edema.     Left lower leg: No edema.     Comments: Patient has good distal strength with no pain over the greater trochanters.  No clonus or focal weakness.Patient somewhat slow to rise from a seated position to full extension.  There is concordant low back pain with facet loading and lumbar spine extension rotation.  There are no definitive trigger points but the patient is somewhat tender across the lower back and PSIS.  There is no pain with hip rotation.   Skin:    Findings: No erythema, lesion or rash.  Neurological:     General: No  focal deficit present.     Mental Status: She is alert and oriented to person, place, and time.     Sensory: No sensory deficit.     Motor: No weakness or abnormal muscle tone.     Coordination: Coordination normal.  Psychiatric:        Mood and Affect: Mood normal.        Behavior: Behavior normal.      Imaging: No results found.

## 2019-12-30 NOTE — Procedures (Signed)
Lumbar Diagnostic Facet Joint Nerve Block with Fluoroscopic Guidance   Patient: Miranda Berger      Date of Birth: 1955/07/10 MRN: [id number] PCP: Shirlean Mylar, MD      Visit Date: 11/28/2019   Universal Protocol:    Date/Time: 11/07/212:52 PM  Consent Given By: the patient  Position: PRONE  Additional Comments: Vital signs were monitored before and after the procedure. Patient was prepped and draped in the usual sterile fashion. The correct patient, procedure, and site was verified.   Injection Procedure Details:   Procedure diagnoses:  1. Spondylosis without myelopathy or radiculopathy, lumbar region      Meds Administered:  Meds ordered this encounter  Medications   methylPREDNISolone acetate (DEPO-MEDROL) injection 80 mg     Laterality: Bilateral  Location/Site: Sacralized L5 segment L4-L5  Needle size: 22 ga.  Needle type:spinal  Needle Placement: Oblique pedical  Findings:   -Comments: There was excellent flow of contrast along the articular pillars without intravascular flow.   Procedure Details: The fluoroscope beam is vertically oriented in AP and then obliqued 15 to 20 degrees to the ipsilateral side of the desired nerve to achieve the Scotty dog appearance.  The skin over the target area of the junction of the superior articulating process and the transverse process (sacral ala if blocking the L5 dorsal rami) was locally anesthetized with a 1 ml volume of 1% Lidocaine without Epinephrine.  The spinal needle was inserted and advanced in a trajectory view down to the target.   After contact with periosteum and negative aspirate for blood and CSF, correct placement without intravascular or epidural spread was confirmed by injecting 0.5 ml. of Isovue-250.  A spot radiograph was obtained of this image.    Next, a 0.5 ml. volume of the injectate described above was injected. The needle was then redirected to the other facet joint nerves mentioned above if  needed.  Prior to the procedure, the patient was given a Pain Diary which was completed for baseline measurements.  After the procedure, the patient rated their pain every 30 minutes and will continue rating at this frequency for a total of 5 hours.  The patient has been asked to complete the Diary and return to [place] by mail, fax or hand delivered as soon as possible.   Additional Comments:  The patient tolerated the procedure well Dressing: 2 x 2 sterile gauze and Band-Aid    Post-procedure details: Patient was observed during the procedure. Post-procedure instructions were reviewed.  Patient left the clinic in stable condition.

## 2020-01-07 ENCOUNTER — Telehealth: Payer: Self-pay

## 2020-01-07 NOTE — Telephone Encounter (Signed)
Pt returned call. Please call back when available. 

## 2020-01-07 NOTE — Telephone Encounter (Signed)
Received this notice from [employer]: "Yes she is eligible for a new cpap machine. She would need an office visit stating that she is compliant and benefiting from her current therapy."

## 2020-01-07 NOTE — Telephone Encounter (Signed)
Patient has left a message for RN to give her a call back

## 2020-01-07 NOTE — Telephone Encounter (Signed)
I called pt.  She has a respironics machine that is greater than 64 years old. She registered her machine with the [employer] for the recall. She denies using a So-Clean machine and denies subjecting the machine to extreme heat. She can continue using her cpap. She is interested in a new cpap since this one is over 71 years old. I will verify with [employer]. Pt was scheduled for 01/09/20 at 2:30pm with Dr. Frances Furbish. Pt verbalized understanding of new appt date and time and recommendations.

## 2020-01-07 NOTE — Telephone Encounter (Signed)
I called pt to discuss. No answer, left a message asking her to call me back. 

## 2020-01-09 ENCOUNTER — Ambulatory Visit: Payer: Self-pay | Admitting: Neurology

## 2020-01-28 ENCOUNTER — Emergency Department (HOSPITAL_BASED_OUTPATIENT_CLINIC_OR_DEPARTMENT_OTHER)
Admission: EM | Admit: 2020-01-28 | Discharge: 2020-01-28 | Disposition: A | Discharge status: Home / Self Care | Payer: BC Managed Care – PPO | Attending: Emergency Medicine | Admitting: Emergency Medicine

## 2020-01-28 ENCOUNTER — Other Ambulatory Visit: Payer: Self-pay

## 2020-01-28 ENCOUNTER — Encounter (HOSPITAL_BASED_OUTPATIENT_CLINIC_OR_DEPARTMENT_OTHER): Payer: Self-pay | Admitting: Emergency Medicine

## 2020-01-28 ENCOUNTER — Emergency Department (HOSPITAL_BASED_OUTPATIENT_CLINIC_OR_DEPARTMENT_OTHER): Payer: BC Managed Care – PPO

## 2020-01-28 DIAGNOSIS — J45909 Unspecified asthma, uncomplicated: Secondary | ICD-10-CM | POA: Diagnosis not present

## 2020-01-28 DIAGNOSIS — I10 Essential (primary) hypertension: Secondary | ICD-10-CM | POA: Diagnosis not present

## 2020-01-28 DIAGNOSIS — Z79899 Other long term (current) drug therapy: Secondary | ICD-10-CM | POA: Insufficient documentation

## 2020-01-28 DIAGNOSIS — Z7982 Long term (current) use of aspirin: Secondary | ICD-10-CM | POA: Insufficient documentation

## 2020-01-28 DIAGNOSIS — R0602 Shortness of breath: Secondary | ICD-10-CM | POA: Insufficient documentation

## 2020-01-28 DIAGNOSIS — Z955 Presence of coronary angioplasty implant and graft: Secondary | ICD-10-CM | POA: Diagnosis not present

## 2020-01-28 DIAGNOSIS — Z7951 Long term (current) use of inhaled steroids: Secondary | ICD-10-CM | POA: Insufficient documentation

## 2020-01-28 DIAGNOSIS — R0789 Other chest pain: Secondary | ICD-10-CM | POA: Insufficient documentation

## 2020-01-28 LAB — BASIC METABOLIC PANEL
Anion gap: 8 (ref 5–15)
BUN: 13 mg/dL (ref 8–23)
CO2: 28 mmol/L (ref 22–32)
Calcium: 10 mg/dL (ref 8.9–10.3)
Chloride: 104 mmol/L (ref 98–111)
Creatinine, Ser: 0.91 mg/dL (ref 0.44–1.00)
GFR, Estimated: >60 mL/min (ref >60)
Glucose, Bld: 99 mg/dL (ref 70–99)
Potassium: 3.9 mmol/L (ref 3.5–5.1)
Sodium: 140 mmol/L (ref 135–145)

## 2020-01-28 LAB — CBC
HCT: 36.3 % (ref 36.0–46.0)
Hemoglobin: 12 g/dL (ref 12.0–15.0)
MCH: 30.7 pg (ref 26.0–34.0)
MCHC: 33.1 g/dL (ref 30.0–36.0)
MCV: 92.8 fL (ref 80.0–100.0)
Platelets: 211 10*3/uL (ref 150–400)
RBC: 3.91 MIL/uL (ref 3.87–5.11)
RDW: 12.9 % (ref 11.5–15.5)
WBC: 6.6 10*3/uL (ref 4.0–10.5)
nRBC: 0 % (ref 0.0–0.2)

## 2020-01-28 LAB — TROPONIN I (HIGH SENSITIVITY): Troponin I (High Sensitivity): 3 ng/L (ref <18)

## 2020-01-28 MED ORDER — PREDNISONE 10 MG PO TABS
20.0000 mg | ORAL_TABLET | Freq: Two times a day (BID) | ORAL | 0 refills | Status: DC
Start: 2020-01-28 — End: 2020-10-23

## 2020-01-28 NOTE — ED Triage Notes (Signed)
Pt states she has a little chest tightness.  She states she has hx of asthma, took her inhaler and felt a little better.  Pt concerned that she has a heart valve and wants to be checked out.

## 2020-01-28 NOTE — ED Provider Notes (Signed)
[facility] EMERGENCY DEPARTMENT Provider Note   CSN: [id number] Arrival date & time: 01/28/20  1200     History Chief Complaint  Patient presents with  . Chest Pain    Miranda Berger is a 64 y.o. female.  Patient is a 64 year old female with history of asthma, mitral valve replacement, depression.  She presents today for evaluation of chest discomfort and shortness of breath.  Patient describes a tight sensation just to the right of the sternum and her back that started yesterday.  Symptoms have persisted into today.  She called her primary doctor and was instructed to come here.  She has a history of asthma and has had exacerbations that have caused similar symptoms.  She denies any fevers or chills.  She denies any productive cough.  She denies any exertional symptoms.  There is no swelling or pain in her legs.  The history is provided by the patient.  Chest Pain Pain location:  R chest and substernal area Pain quality: tightness   Pain radiates to:  Does not radiate Pain severity:  Mild Onset quality:  Sudden Duration:  24 hours Timing:  Constant Progression:  Unchanged Chronicity:  New Relieved by:  Nothing Worsened by:  Nothing      Past Medical History:  Diagnosis Date  . Anemia    in the past   . Anxiety   . Asthma   . Depression   . Family history of adverse reaction to anesthesia    mom had nausea  . GERD (gastroesophageal reflux disease)   . Hepatitis    Hepatitis A in 3rd grade?  [name] Hypertension   . PONV (postoperative nausea and vomiting)   . 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 Sleep apnea    uses CPAP, 12, starts at 6    Patient Active Problem List   Diagnosis Date Noted  . 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  Costochondral chest pain 04/04/2018  . Pericarditis 09/12/2017  . LBBB (left bundle branch block) 08/08/2017  . Obstructive sleep apnea   . Anxiety   . Asthma   . 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 07/26/2017  . Hypertension 06/21/2017  . Aortic stenosis 06/20/2017  . Degenerative tear of medial meniscus of right knee 04/21/2016  . High-tone pelvic floor dysfunction 12/26/2013  . Difficult or painful urination 06/22/2012  . Chronic interstitial cystitis 06/22/2012  . Dyspareunia 06/22/2012  . Female pelvic pain 06/22/2012  . Urinary urgency 06/22/2012    Past Surgical History:  Procedure Laterality Date  . ABDOMINAL HYSTERECTOMY    . AORTIC VALVE REPLACEMENT N/A 07/26/2017   Procedure: MINIMALLY INVASIVE AORTIC VALVE REPLACEMENT (AVR);  Surgeon: Purcell Nails, MD;  Location: [facility] OR;  Service: Open Heart Surgery;  Laterality: N/A;  . BLADDER SURGERY  2011  . CARDIAC CATHETERIZATION    . CARPAL TUNNEL RELEASE  2012  . CHOLECYSTECTOMY    . RIGHT/LEFT HEART CATH AND CORONARY ANGIOGRAPHY N/A 07/08/2017   Procedure: RIGHT/LEFT HEART CATH AND CORONARY ANGIOGRAPHY;  Surgeon: Tonny Bollman, MD;  Location: [facility] INVASIVE CV LAB;  Service: Cardiovascular;  Laterality: N/A;  . TEE WITHOUT CARDIOVERSION N/A 07/26/2017   Procedure: TRANSESOPHAGEAL ECHOCARDIOGRAM (TEE);  Surgeon: Purcell Nails, MD;  Location: [facility] OR;  Service: Open Heart Surgery;  Laterality: N/A;  .  TONSILLECTOMY       OB History   No obstetric history on file.     Family History  Problem Relation Age of Onset  . Stroke Mother   . Leukemia Mother   . Stroke Father   . Sleep apnea Father   . Valvular heart disease Brother   . Heart Problems Maternal Aunt   . Valvular heart disease Paternal Grandfather   . Valvular heart disease Maternal Aunt     Social History   Tobacco Use  . Smoking status: Never Smoker  . Smokeless tobacco: Never Used  Vaping Use  . Vaping Use: Never  used  Substance Use Topics  . Alcohol use: No    Alcohol/week: 0.0 standard drinks  . Drug use: No    Home Medications Prior to Admission medications   Medication Sig Start Date End Date Taking? Authorizing Provider  acebutolol (SECTRAL) 200 MG capsule Take 1 capsule (200 mg total) by mouth every other day. 11/05/19   Baldo Daub, MD  acetaminophen (TYLENOL) 500 MG tablet Take 1,000 mg by mouth every 6 (six) hours as needed for mild pain or fever.     [provider]  albuterol (PROVENTIL HFA;VENTOLIN HFA) 108 (90 Base) MCG/ACT inhaler Inhale 2 puffs into the lungs every 6 (six) hours as needed for wheezing or shortness of breath.    [provider]  ALPRAZolam Prudy Feeler) 0.5 MG tablet Take 0.25 mg by mouth in the morning and at bedtime.     [provider]  aspirin EC 81 MG tablet Take 81 mg by mouth daily.    [provider]  buPROPion (WELLBUTRIN XL) 300 MG 24 hr tablet Take 1 tablet (300 mg total) by mouth daily. 07/18/18   Money, Gerlene Burdock, FNP  calcium carbonate (OS-CAL) 600 MG TABS tablet Take 600 mg by mouth daily.     [provider]  fluticasone (FLONASE) 50 MCG/ACT nasal spray Place 1 spray into both nostrils daily as needed for allergies.     [provider]  Fluticasone-Salmeterol (ADVAIR) 250-50 MCG/DOSE AEPB Inhale 1 puff into the lungs 2 (two) times daily.    [provider]  Loratadine (CLARITIN) 10 MG CAPS Take 1 capsule by mouth daily as needed.    [provider]  meloxicam (MOBIC) 15 MG tablet Take 1 tablet (15 mg total) by mouth daily. 07/18/18   Money, Gerlene Burdock, FNP  mirtazapine (REMERON) 15 MG tablet Take 7.5 mg by mouth at bedtime.     [provider]  Multiple Vitamin (MULTIVITAMIN) capsule Take 1 capsule by mouth daily.    [provider]    Allergies    Codeine and Prednisone  Review of Systems   Review of Systems  Cardiovascular: Positive for chest pain.  All other  systems reviewed and are negative.   Physical Exam Updated Vital Signs BP 116/74   Pulse 79   Temp 98.2 F (36.8 C)   Resp 14   Ht 5\' 3"  (1.6 m)   Wt 69.4 kg   SpO2 98%   BMI 27.10 kg/m   Physical Exam Vitals and nursing note reviewed.  Constitutional:      General: She is not in acute distress.    Appearance: She is well-developed. She is not diaphoretic.  HENT:     Head: Normocephalic and atraumatic.  Cardiovascular:     Rate and Rhythm: Normal rate and regular rhythm.     Heart sounds: No murmur heard.  No  friction rub. No gallop.   Pulmonary:     Effort: Pulmonary effort is normal. No respiratory distress.     Breath sounds: Normal breath sounds. No wheezing.  Abdominal:     General: Bowel sounds are normal. There is no distension.     Palpations: Abdomen is soft.     Tenderness: There is no abdominal tenderness.  Musculoskeletal:        General: Normal range of motion.     Cervical back: Normal range of motion and neck supple.     Right lower leg: No tenderness. No edema.     Left lower leg: No tenderness. No edema.  Skin:    General: Skin is warm and dry.  Neurological:     Mental Status: She is alert and oriented to person, place, and time.     ED Results / Procedures / Treatments   Labs (all labs ordered are listed, but only abnormal results are displayed) Labs Reviewed  BASIC METABOLIC PANEL  CBC  TROPONIN I (HIGH SENSITIVITY)    EKG EKG Interpretation  Date/Time:  Monday January 28 2020 12:15:32 EST Ventricular Rate:  92 PR Interval:  154 QRS Duration: 84 QT Interval:  368 QTC Calculation: 455 R Axis:   65 Text Interpretation: Normal sinus rhythm Normal ECG Confirmed by Geoffery Lyons ([id number]) on 01/28/2020 2:34:39 PM   Radiology DG Chest 2 View  Result Date: 01/28/2020 CLINICAL DATA:  Chest pain. EXAM: CHEST - 2 VIEW COMPARISON:  Jul 09, 2019. FINDINGS: The heart size and mediastinal contours are within normal limits. Aortic valve  replacement. Sternal plate and screws. Both lungs are clear. No visible pleural effusions or pneumothorax. No acute osseous abnormality. Cholecystectomy clips. IMPRESSION: No active cardiopulmonary disease. Electronically Signed   By: Feliberto Harts MD   On: 01/28/2020 12:45    Procedures Procedures (including critical care time)  Medications Ordered in ED Medications - No data to display  ED Course  I have reviewed the triage vital signs and the nursing notes.  Pertinent labs & imaging results that were available during my care of the patient were reviewed by me and considered in my medical decision making (see chart for details).    MDM Rules/Calculators/A&P  Patient presenting here with complaints of chest discomfort as described in the HPI.  I highly doubt a cardiac etiology.  Her EKG is unchanged, troponin is negative, and heart cath in 2019 showed normal coronary arteries.  Remainder of the work-up is unremarkable including laboratory studies and chest x-ray.  At this point, it is unclear to me as to what the exact etiology of her symptoms is, however nothing appears emergent.  Patient to be treated with prednisone for possible asthma exacerbation.  She is to follow-up with primary doctor and return here if symptoms worsen or change.  Final Clinical Impression(s) / ED Diagnoses Final diagnoses:  None    Rx / DC Orders ED Discharge Orders    None       Geoffery Lyons, MD 01/28/20 1436

## 2020-01-28 NOTE — ED Notes (Signed)
ED Provider at bedside. 

## 2020-01-28 NOTE — Discharge Instructions (Addendum)
Begin taking prednisone as prescribed.  Continue use of your albuterol inhaler, 2 puffs every 4 hours as needed.  Follow-up with primary doctor if symptoms or not improving in the next week, and return to the ER if you develop worsening pain, difficulty breathing, or other new and concerning symptoms.

## 2020-02-28 NOTE — Telephone Encounter (Signed)
Spoke with patient , Sx onset 02/22/20 and test positive 02/23/20 . She is outside the window for MAB and oral antivirals at this time.  Advised to continue with supportive care and follow up with PCP.  Please contact office for sooner follow up if symptoms do not improve or worsen or seek emergency care

## 2020-03-09 ENCOUNTER — Other Ambulatory Visit: Payer: Self-pay | Admitting: Cardiology

## 2020-03-17 ENCOUNTER — Other Ambulatory Visit: Payer: Self-pay | Admitting: Cardiology

## 2020-03-17 ENCOUNTER — Telehealth: Payer: Self-pay | Admitting: Cardiology

## 2020-03-17 MED ORDER — AMOXICILLIN 500 MG PO CAPS: 2000.0000 mg | ORAL_CAPSULE | Freq: Once | ORAL | 0 refills | Status: AC

## 2020-03-17 NOTE — Telephone Encounter (Signed)
Called medication in. Patient is aware.

## 2020-03-17 NOTE — Telephone Encounter (Signed)
Yes, I am unsure why this is not her meds she has a prosthetic aortic valve and takes amoxicillin 2 g 45 minutes to an hour prior to dental visits.  Usually give people 20 and provide is kept in the container is good for several years

## 2020-03-17 NOTE — Telephone Encounter (Signed)
*  STAT* If patient is at the pharmacy, call can be transferred to refill team.   1. Which medications need to be refilled? (please list name of each medication and dose if known)   amoxicillin (AMOXIL) 500 MG capsule     2. Which pharmacy/location (including street and city if local pharmacy) is medication to be sent to?   [id number] - [place], [place] - [address]  3. Do they need a 30 day or 90 day supply? Patient states she usually gets 12 at a time and must use them before she goes to the dentist, patient states that she has a dentist appt today and only has 2 pills. Please advise.

## 2020-04-01 ENCOUNTER — Telehealth: Payer: Self-pay | Admitting: Cardiology

## 2020-04-01 NOTE — Telephone Encounter (Signed)
I would refer to her PCP

## 2020-04-01 NOTE — Telephone Encounter (Signed)
Pt c/o medication issue:  1. Name of Medication: acetaminophen (TYLENOL) 500 MG tablet    2. How are you currently taking this medication (dosage and times per day)? As prescribed.  3. Are you having a reaction (difficulty breathing--STAT)? No.  4. What is your medication issue? Patient states that she has back issues and this medication is not working for her, she would like Dr. Dulce Sellar to give her a suggestion for a safe alternative. Please advise.

## 2020-04-01 NOTE — Telephone Encounter (Signed)
Spoke to the patient just now and let her know Dr. Munley's recommendations. She verbalizes understanding and thanks me for the call back.  

## 2020-04-11 ENCOUNTER — Other Ambulatory Visit: Payer: Self-pay | Admitting: Family Medicine

## 2020-04-11 DIAGNOSIS — M5412 Radiculopathy, cervical region: Secondary | ICD-10-CM

## 2020-04-26 ENCOUNTER — Other Ambulatory Visit: Payer: Self-pay

## 2020-04-26 ENCOUNTER — Ambulatory Visit
Admission: RE | Admit: 2020-04-26 | Discharge: 2020-04-26 | Disposition: A | Discharge status: Home / Self Care | Payer: BC Managed Care – PPO | Source: Ambulatory Visit | Attending: Family Medicine | Admitting: Family Medicine

## 2020-04-26 DIAGNOSIS — M5412 Radiculopathy, cervical region: Secondary | ICD-10-CM

## 2020-04-30 ENCOUNTER — Other Ambulatory Visit: Payer: Self-pay

## 2020-04-30 ENCOUNTER — Ambulatory Visit: Payer: Self-pay

## 2020-04-30 ENCOUNTER — Encounter: Payer: Self-pay | Admitting: Orthopedic Surgery

## 2020-04-30 ENCOUNTER — Ambulatory Visit: Payer: BC Managed Care – PPO | Admitting: Orthopedic Surgery

## 2020-04-30 DIAGNOSIS — M25511 Pain in right shoulder: Secondary | ICD-10-CM

## 2020-04-30 MED ORDER — METHOCARBAMOL 500 MG PO TABS: 500.0000 mg | ORAL_TABLET | Freq: Three times a day (TID) | ORAL | 0 refills | Status: DC | PRN

## 2020-04-30 MED ORDER — TRAMADOL HCL 50 MG PO TABS: ORAL_TABLET | ORAL | 0 refills | Status: DC

## 2020-04-30 NOTE — Progress Notes (Signed)
Office Visit Note   Patient: Miranda Berger           Date of Birth: [birth year]           MRN: [id number] Visit Date: 04/30/2020 Requested by: Shirlean Mylar, MD [address] [place],  [place] [address] PCP: Shirlean Mylar, MD  Subjective: Chief Complaint  Patient presents with   Neck - Pain   Right Shoulder - Pain    HPI: Miranda Berger is a 65 year old patient with right shoulder and neck pain for the last 4 weeks.  Reports pain behind her scapula.  Also the pain radiates down into her forearm.  Denies any discrete numbness and tingling.  The pain does wake her from sleep at night.  She also reports some headaches.  Her old notes are reviewed.  MRI scan right shoulder 2011 showed bursitis and tendinitis with mild SLAP tear.  She is done reasonably well with the shoulder in the past decade.  She also has had an MRI scan of the cervical spine which is reviewed.  That scan does show on the right-hand side at C5 some facet arthritis and foraminal narrowing.  She describes burning pain on the right-hand side.  She has taken meloxicam without much relief.  She has had 2 injections in her lumbar spine for similar types of symptoms.              ROS: All systems reviewed are negative as they relate to the chief complaint within the history of present illness.  Patient denies  fevers or chills.   Assessment & Plan: Visit Diagnoses:  1. Right shoulder pain, unspecified chronicity     Plan: Impression is right shoulder pain which looks like it is referred pain from the C5 nerve root being compressed on the right-hand side.  It was a great idea on Dr. Marland Mcalpine part to order the study.  Plan at this time is Ultram to use at night and Robaxin as a muscle relaxer.  I think she may benefit from epidural steroid injections in her neck but her symptoms do not really rise to that level of severity yet.  I think for the most part she just wanted to make sure that there is no surgical intervention in  the shoulder or neck which I do not think there is at this time.  She will call me if she wants to get set up to get neck injected with Dr. Alvester Morin.  Follow-Up Instructions: Return if symptoms worsen or fail to improve.   Orders:  Orders Placed This Encounter  Procedures   XR Shoulder Right   Meds ordered this encounter  Medications   methocarbamol (ROBAXIN) 500 MG tablet    Sig: Take 1 tablet (500 mg total) by mouth every 8 (eight) hours as needed for muscle spasms.    Dispense:  30 tablet    Refill:  0   traMADol (ULTRAM) 50 MG tablet    Sig: 1 po q hs prn pain    Dispense:  20 tablet    Refill:  0      Procedures: No procedures performed   Clinical Data: No additional findings.  Objective: Vital Signs: There were no vitals taken for this visit.  Physical Exam:   Constitutional: Patient appears well-developed HEENT:  Head: Normocephalic Eyes:EOM are normal Neck: Normal range of motion Cardiovascular: Normal rate Pulmonary/chest: Effort normal Neurologic: Patient is alert Skin: Skin is warm Psychiatric: Patient has normal mood and affect  Ortho Exam: Ortho exam demonstrates pretty good cervical spine range of motion.  Patient has lost about 40 pounds since last visit.  Patient is 5 out of 5 grip EPL FPL interosseous are/extension bicep tricep deltoid strength with the right shoulder demonstrating excellent rotator cuff strength no restriction of external rotation 15 degrees of abduction no instability in no coarse grinding or crepitus with active or passive range of motion.  No definite paresthesias C5-T1.  Reflexes 1+ out of 4 bilateral biceps and triceps.  Specialty Comments:  No specialty comments available.  Imaging: XR Shoulder Right  Result Date: 04/30/2020 AP outlet axillary right shoulder reviewed.  No acromiohumeral joint space narrowing.  No acute fracture.  No glenohumeral joint or AC joint arthritis.  Visualized lung fields clear.  Normal right  shoulder radiographs    PMFS History: Patient Active Problem List   Diagnosis Date Noted   Major depressive disorder, single episode, severe, with psychosis (HCC) 07/11/2018   MDD (major depressive disorder), recurrent severe, 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   Costochondral chest pain 04/04/2018   Pericarditis 09/12/2017   LBBB (left bundle branch block) 08/08/2017   Obstructive sleep apnea    Anxiety    Asthma    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 07/26/2017   Hypertension 06/21/2017   Aortic stenosis 06/20/2017   Degenerative tear of medial meniscus of right knee 04/21/2016   High-tone pelvic floor dysfunction 12/26/2013   Difficult or painful urination 06/22/2012   Chronic interstitial cystitis 06/22/2012   Dyspareunia 06/22/2012   Female pelvic pain 06/22/2012   Urinary urgency 06/22/2012   Past Medical History:  Diagnosis Date   Anemia    in the past    Anxiety    Asthma    Depression    Family history of adverse reaction to anesthesia    mom had nausea   GERD (gastroesophageal reflux disease)    Hepatitis    Hepatitis A in 3rd grade?   Hypertension    PONV (postoperative nausea and vomiting)    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 07/26/2017   [employer], size 23   Sleep apnea    uses CPAP, 12, starts at 6    Family History  Problem Relation Age of Onset   Stroke Mother    Leukemia Mother    Stroke Father    Sleep apnea Father    Valvular heart disease Brother    Heart Problems Maternal Aunt    Valvular heart disease Paternal Grandfather    Valvular heart disease Maternal Aunt     Past Surgical History:  Procedure Laterality Date   ABDOMINAL HYSTERECTOMY     AORTIC VALVE REPLACEMENT N/A 07/26/2017   Procedure: MINIMALLY INVASIVE AORTIC VALVE REPLACEMENT (AVR);  Surgeon: Purcell Nails, MD;  Location: [facility] OR;  Service: Open Heart  Surgery;  Laterality: N/A;   BLADDER SURGERY  2011   CARDIAC CATHETERIZATION     CARPAL TUNNEL RELEASE  2012   CHOLECYSTECTOMY     RIGHT/LEFT HEART CATH AND CORONARY ANGIOGRAPHY N/A 07/08/2017   Procedure: RIGHT/LEFT HEART CATH AND CORONARY ANGIOGRAPHY;  Surgeon: Tonny Bollman, MD;  Location: [facility] INVASIVE CV LAB;  Service: Cardiovascular;  Laterality: N/A;   TEE WITHOUT CARDIOVERSION N/A 07/26/2017   Procedure: TRANSESOPHAGEAL ECHOCARDIOGRAM (TEE);  Surgeon: Purcell Nails, MD;  Location: [facility] OR;  Service: Open Heart Surgery;  Laterality: N/A;   TONSILLECTOMY     Social History   Occupational History  Not on file  Tobacco Use   Smoking status: Never Smoker   Smokeless tobacco: Never Used  Vaping Use   Vaping Use: Never used  Substance and Sexual Activity   Alcohol use: No    Alcohol/week: 0.0 standard drinks   Drug use: No   Sexual activity: Not on file

## 2020-05-02 ENCOUNTER — Other Ambulatory Visit: Payer: Self-pay | Admitting: Surgical

## 2020-05-02 ENCOUNTER — Telehealth: Payer: Self-pay

## 2020-05-02 NOTE — Telephone Encounter (Signed)
Patient called she stated she was not able to receive rx due to insurance she stated she already has tramadol from her pcp but she has taken more than she was supposed to she only has 2 pills left she is requesting something different if possible patient would like a call back:[phone]

## 2020-05-05 ENCOUNTER — Other Ambulatory Visit: Payer: Self-pay | Admitting: Surgical

## 2020-05-05 MED ORDER — HYDROCODONE-ACETAMINOPHEN 5-325 MG PO TABS
1.0000 | ORAL_TABLET | Freq: Every evening | ORAL | 0 refills | Status: DC | PRN
Start: 2020-05-05 — End: 2020-05-23

## 2020-05-05 NOTE — Telephone Encounter (Signed)
Sent in rx for norco 5 qhs prn. No refills after

## 2020-05-06 ENCOUNTER — Telehealth: Payer: Self-pay | Admitting: Orthopedic Surgery

## 2020-05-06 DIAGNOSIS — M5412 Radiculopathy, cervical region: Secondary | ICD-10-CM

## 2020-05-06 NOTE — Telephone Encounter (Signed)
Pt called and would like Lauren to give her a call back. There is some medication issue going on. Cb [phone]

## 2020-05-06 NOTE — Telephone Encounter (Signed)
IC patient.  She complains of being in constant pain. She is requesting to proceed with ESI. I have put in order for this.

## 2020-05-06 NOTE — Telephone Encounter (Signed)
LMOM for patient of the below message  

## 2020-05-06 NOTE — Addendum Note (Signed)
Addended byPrescott Parma on: 05/06/2020 02:38 PM   Modules accepted: Orders

## 2020-05-09 ENCOUNTER — Other Ambulatory Visit: Payer: Self-pay | Admitting: Orthopedic Surgery

## 2020-05-09 DIAGNOSIS — M5412 Radiculopathy, cervical region: Secondary | ICD-10-CM

## 2020-05-12 ENCOUNTER — Telehealth: Payer: Self-pay | Admitting: Orthopedic Surgery

## 2020-05-12 NOTE — Telephone Encounter (Signed)
Ok for note 

## 2020-05-12 NOTE — Telephone Encounter (Signed)
Pt called and was wondering if she could get a doctors note because she has jury duty the day after her injection and she wanted to push it back since she is in so much pain. Please give her a call if you can [phone].

## 2020-05-12 NOTE — Telephone Encounter (Signed)
Yes  thx

## 2020-05-13 NOTE — Telephone Encounter (Signed)
Note done for patient. She will try to access through mychart.

## 2020-05-14 ENCOUNTER — Other Ambulatory Visit: Payer: Self-pay

## 2020-05-14 ENCOUNTER — Ambulatory Visit
Admission: RE | Admit: 2020-05-14 | Discharge: 2020-05-14 | Disposition: A | Discharge status: Home / Self Care | Payer: BC Managed Care – PPO | Source: Ambulatory Visit | Attending: Orthopedic Surgery | Admitting: Orthopedic Surgery

## 2020-05-14 DIAGNOSIS — M5412 Radiculopathy, cervical region: Secondary | ICD-10-CM

## 2020-05-14 MED ORDER — IOPAMIDOL (ISOVUE-M 300) INJECTION 61%
1.0000 mL | Freq: Once | INTRAMUSCULAR | Status: AC | PRN
  Administered 2020-05-14: 1 mL via EPIDURAL

## 2020-05-14 MED ORDER — TRIAMCINOLONE ACETONIDE 40 MG/ML IJ SUSP (RADIOLOGY)
60.0000 mg | Freq: Once | INTRAMUSCULAR | Status: AC
  Administered 2020-05-14: 60 mg via EPIDURAL

## 2020-05-14 NOTE — Discharge Instructions (Signed)

## 2020-05-22 ENCOUNTER — Telehealth: Payer: Self-pay | Admitting: Orthopedic Surgery

## 2020-05-22 NOTE — Telephone Encounter (Signed)
Patient called requesting stronger pain medication. Patient states she has a pinched nerve in her back and is experiencing severe neck and back pains. The pain medication she is taking now is not touching her pain. Please send to pharmacy on file [facility]. Patient phone number is [phone].

## 2020-05-23 ENCOUNTER — Other Ambulatory Visit: Payer: Self-pay | Admitting: Specialist

## 2020-05-23 ENCOUNTER — Ambulatory Visit: Payer: BC Managed Care – PPO | Admitting: Surgical

## 2020-05-23 ENCOUNTER — Other Ambulatory Visit: Payer: Self-pay

## 2020-05-23 ENCOUNTER — Encounter: Payer: Self-pay | Admitting: Surgical

## 2020-05-23 DIAGNOSIS — M7711 Lateral epicondylitis, right elbow: Secondary | ICD-10-CM | POA: Diagnosis not present

## 2020-05-23 DIAGNOSIS — M7541 Impingement syndrome of right shoulder: Secondary | ICD-10-CM | POA: Diagnosis not present

## 2020-05-23 DIAGNOSIS — M5412 Radiculopathy, cervical region: Secondary | ICD-10-CM | POA: Diagnosis not present

## 2020-05-23 MED ORDER — METHOCARBAMOL 500 MG PO TABS: 500.0000 mg | ORAL_TABLET | Freq: Three times a day (TID) | ORAL | 0 refills | Status: DC | PRN

## 2020-05-23 MED ORDER — BETAMETHASONE SOD PHOS & ACET 6 (3-3) MG/ML IJ SUSP
6.0000 mg | INTRAMUSCULAR | Status: AC | PRN
  Administered 2020-05-23: 6 mg via INTRA_ARTICULAR

## 2020-05-23 MED ORDER — HYDROCODONE-ACETAMINOPHEN 5-325 MG PO TABS: 1.0000 | ORAL_TABLET | Freq: Every evening | ORAL | 0 refills | Status: DC | PRN

## 2020-05-23 MED ORDER — BUPIVACAINE HCL 0.5 % IJ SOLN
9.0000 mL | INTRAMUSCULAR | Status: AC | PRN
  Administered 2020-05-23: 9 mL via INTRA_ARTICULAR

## 2020-05-23 MED ORDER — GABAPENTIN 100 MG PO CAPS: 100.0000 mg | ORAL_CAPSULE | Freq: Three times a day (TID) | ORAL | 0 refills | Status: DC

## 2020-05-23 MED ORDER — LIDOCAINE HCL 1 % IJ SOLN
5.0000 mL | INTRAMUSCULAR | Status: AC | PRN
  Administered 2020-05-23: 5 mL

## 2020-05-23 NOTE — Progress Notes (Signed)
Office Visit Note   Patient: Miranda Berger           Date of Birth: 03/12/1955           MRN: [id number] Visit Date: 05/23/2020 Requested by: Shirlean Mylar, MD [address] [place],  [place] [address] PCP: Shirlean Mylar, MD  Subjective: Chief Complaint  Patient presents with  . Other    Back pain    HPI: Yenesis Even is a 65 y.o. female who presents to the office complaining of neck pain and right shoulder pain.  Patient went to have neck injection last Wednesday.  This provided excellent relief the following day but pain has gradually returned to the point where today she feels only 10% better following her injection.  She complains of continued right-sided neck pain that radiates into her shoulder blade as well as lateral right shoulder pain.  She has radiation down her arm and her arm feels weak at times.  She reports she has right elbow pain to the lateral elbow.  She is unable to sleep on her right side due to her shoulder pain.  She does report that the shooting pain into her fingers has improved to just a tingling sensation.  Denies any history of shoulder surgery.  She does have MRI of the right shoulder in 2011 that revealed mild supra spinatus and subscapularis tendinopathy with a small SLAP tear.              ROS: All systems reviewed are negative as they relate to the chief complaint within the history of present illness.  Patient denies fevers or chills.  Assessment & Plan: Visit Diagnoses:  1. Radiculopathy, cervical region   2. Right tennis elbow   3. Impingement syndrome of right shoulder     Plan: Patient is a 65 year old female who presents with some relief from recent cervical spine ESI.  She does report most of her pain got better with injection but she has had recurrent pain over the last week as well as some increased pain in her lateral right shoulder.  Bothers her to sleep on that side at night.  No significant weakness on exam but she  does have a history of some mild tendinopathy of the rotator cuff and a mild SLAP tear that was noted from right shoulder MRI in 2011.  May be some contribution of shoulder pathology to her mostly radicular cervical spine pain.  Administered right shoulder subacromial injection today to see if this will provide any relief and how much relief it provides relative to the cervical spine ESI.  Also plan to refer patient back to [facility] imaging for repeat C-spine ESI.  Prescribe Neurontin and muscle relaxer as well as hydrocodone nightly as needed.  This will be the last narcotic prescription for her.  Also seems that she is dealing with a little bit of tennis elbow.  Taught patient some stretches that should help with this.  Reevaluate at next office appointment.  Follow-up in 3 weeks with Dr. August Saucer for clinical recheck.  Follow-Up Instructions: No follow-ups on file.   Orders:  Orders Placed This Encounter  Procedures  . Epidural Steroid Injection - Cervical/Thoracic (Ancillary Performed)   Meds ordered this encounter  Medications  . HYDROcodone-acetaminophen (NORCO/VICODIN) 5-325 MG tablet    Sig: Take 1 tablet by mouth at bedtime as needed for moderate pain.    Dispense:  15 tablet    Refill:  0  . methocarbamol (ROBAXIN)  500 MG tablet    Sig: Take 1 tablet (500 mg total) by mouth every 8 (eight) hours as needed for muscle spasms.    Dispense:  30 tablet    Refill:  0  . gabapentin (NEURONTIN) 100 MG capsule    Sig: Take 1 capsule (100 mg total) by mouth 3 (three) times daily.    Dispense:  30 capsule    Refill:  0      Procedures: Large Joint Inj: R subacromial bursa on 05/23/2020 5:41 PM Indications: diagnostic evaluation and pain Details: 18 G 1.5 in needle, posterior approach  Arthrogram: No  Medications: 9 mL bupivacaine 0.5 %; 5 mL lidocaine 1 %; 6 mg betamethasone acetate-betamethasone sodium phosphate 6 (3-3) MG/ML Outcome: tolerated well, no immediate  complications Procedure, treatment alternatives, risks and benefits explained, specific risks discussed. Consent was given by the patient. Immediately prior to procedure a time out was called to verify the correct patient, procedure, equipment, support staff and site/side marked as required. Patient was prepped and draped in the usual sterile fashion.       Clinical Data: No additional findings.  Objective: Vital Signs: There were no vitals taken for this visit.  Physical Exam:  Constitutional: Patient appears well-developed HEENT:  Head: Normocephalic Eyes:EOM are normal Neck: Normal range of motion Cardiovascular: Normal rate Pulmonary/chest: Effort normal Neurologic: Patient is alert Skin: Skin is warm Psychiatric: Patient has normal mood and affect  Ortho Exam: Ortho exam demonstrates right shoulder with well-preserved range of motion is equivalent to the contralateral side.  No crepitus noted with passive motion of the shoulder.  Positive Neer and Hawkins impingement signs.  She has 5 -/5 strength of the supraspinatus of the right shoulder with 5/5 motor strength of infraspinatus and subscapularis.  She has pain with resisted supraspinatus testing.  Tenderness over the axial cervical spine and pain with cervical spine range of motion.  Mild to moderate tenderness over the right [facility] joint that is not present in the left [facility] joint.  Bicep tendon reflexes normoreflexic bilaterally.  5/5 motor strength of bilateral grip strength, finger abduction, pronation/supination, bicep, tricep, deltoid.  Tenderness over the lateral epicondyle with pain elicited in this location by resisted grip strength testing and resisted wrist extension.  Specialty Comments:  No specialty comments available.  Imaging: No results found.   PMFS History: Patient Active Problem List   Diagnosis Date Noted  . 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 MDD (major  depressive disorder), recurrent severe, 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  . Costochondral chest pain 04/04/2018  . Pericarditis 09/12/2017  . LBBB (left bundle branch block) 08/08/2017  . Obstructive sleep apnea   . Anxiety   . Asthma   . 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 07/26/2017  . Hypertension 06/21/2017  . Aortic stenosis 06/20/2017  . Degenerative tear of medial meniscus of right knee 04/21/2016  . High-tone pelvic floor dysfunction 12/26/2013  . Difficult or painful urination 06/22/2012  . Chronic interstitial cystitis 06/22/2012  . Dyspareunia 06/22/2012  . Female pelvic pain 06/22/2012  . Urinary urgency 06/22/2012   Past Medical History:  Diagnosis Date  . Anemia    in the past   . Anxiety   . Asthma   . Depression   . Family history of adverse reaction to anesthesia    mom had nausea  . GERD (gastroesophageal reflux disease)   . Hepatitis    Hepatitis A in 3rd grade?  [name]  Hypertension   . PONV (postoperative nausea and vomiting)   . 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 Sleep apnea    uses CPAP, 12, starts at 6    Family History  Problem Relation Age of Onset  . Stroke Mother   . Leukemia Mother   . Stroke Father   . Sleep apnea Father   . Valvular heart disease Brother   . Heart Problems Maternal Aunt   . Valvular heart disease Paternal Grandfather   . Valvular heart disease Maternal Aunt     Past Surgical History:  Procedure Laterality Date  . ABDOMINAL HYSTERECTOMY    . AORTIC VALVE REPLACEMENT N/A 07/26/2017   Procedure: MINIMALLY INVASIVE AORTIC VALVE REPLACEMENT (AVR);  Surgeon: Purcell Nails, MD;  Location: [facility] OR;  Service: Open Heart Surgery;  Laterality: N/A;  . BLADDER SURGERY  2011  . CARDIAC CATHETERIZATION    . CARPAL TUNNEL RELEASE  2012  . CHOLECYSTECTOMY    . RIGHT/LEFT HEART CATH AND CORONARY ANGIOGRAPHY N/A  07/08/2017   Procedure: RIGHT/LEFT HEART CATH AND CORONARY ANGIOGRAPHY;  Surgeon: Tonny Bollman, MD;  Location: [facility] INVASIVE CV LAB;  Service: Cardiovascular;  Laterality: N/A;  . TEE WITHOUT CARDIOVERSION N/A 07/26/2017   Procedure: TRANSESOPHAGEAL ECHOCARDIOGRAM (TEE);  Surgeon: Purcell Nails, MD;  Location: [facility] OR;  Service: Open Heart Surgery;  Laterality: N/A;  . TONSILLECTOMY     Social History   Occupational History  . Not on file  Tobacco Use  . Smoking status: Never Smoker  . Smokeless tobacco: Never Used  Vaping Use  . Vaping Use: Never used  Substance and Sexual Activity  . Alcohol use: No    Alcohol/week: 0.0 standard drinks  . Drug use: No  . Sexual activity: Not on file

## 2020-05-29 ENCOUNTER — Other Ambulatory Visit: Payer: BC Managed Care – PPO

## 2020-06-09 ENCOUNTER — Ambulatory Visit
Admission: RE | Admit: 2020-06-09 | Discharge: 2020-06-09 | Disposition: A | Discharge status: Home / Self Care | Payer: BC Managed Care – PPO | Source: Ambulatory Visit | Attending: Surgical | Admitting: Surgical

## 2020-06-09 ENCOUNTER — Other Ambulatory Visit: Payer: Self-pay

## 2020-06-09 DIAGNOSIS — M5412 Radiculopathy, cervical region: Secondary | ICD-10-CM

## 2020-06-09 MED ORDER — TRIAMCINOLONE ACETONIDE 40 MG/ML IJ SUSP (RADIOLOGY)
60.0000 mg | Freq: Once | INTRAMUSCULAR | Status: AC
  Administered 2020-06-09: 60 mg via EPIDURAL

## 2020-06-09 MED ORDER — IOPAMIDOL (ISOVUE-M 300) INJECTION 61%
1.0000 mL | Freq: Once | INTRAMUSCULAR | Status: AC | PRN
  Administered 2020-06-09: 1 mL via EPIDURAL

## 2020-06-09 NOTE — Discharge Instructions (Signed)

## 2020-06-13 ENCOUNTER — Ambulatory Visit: Payer: BC Managed Care – PPO | Admitting: Orthopedic Surgery

## 2020-06-13 ENCOUNTER — Other Ambulatory Visit: Payer: Self-pay

## 2020-06-13 DIAGNOSIS — M25511 Pain in right shoulder: Secondary | ICD-10-CM | POA: Diagnosis not present

## 2020-06-13 DIAGNOSIS — M5412 Radiculopathy, cervical region: Secondary | ICD-10-CM | POA: Diagnosis not present

## 2020-06-13 MED ORDER — GABAPENTIN 100 MG PO CAPS: 100.0000 mg | ORAL_CAPSULE | Freq: Three times a day (TID) | ORAL | 0 refills | Status: DC

## 2020-06-14 ENCOUNTER — Encounter: Payer: Self-pay | Admitting: Orthopedic Surgery

## 2020-06-14 NOTE — Progress Notes (Signed)
Office Visit Note   Patient: Miranda Berger           Date of Birth: 1955-05-25           MRN: [id number] Visit Date: 06/13/2020 Requested by: Shirlean Mylar, MD [address] [place],  [place] [address] PCP: Shirlean Mylar, MD  Subjective: Chief Complaint  Patient presents with  . Follow-up    HPI: Miranda Berger is a 65 year old patient with right shoulder arm and neck pain.  Had a subacromial injection for 122 documented in last office notes which are reviewed.  That helped but did not last.  Still is hurting in her scapular region with pain radiating down to the elbow.  Stabbing type pain in the scapular region.  Constant pain in this region.  Some mechanical symptoms in the shoulder joint.  MRI scan over 10 years ago showed a very small nondisplaced SLAP tear with intact rotator cuff.  Patient has had an injection in her cervical spine also within the last several weeks which helped some.  She is going on a cruise in a week.              ROS: All systems reviewed are negative as they relate to the chief complaint within the history of present illness.  Patient denies  fevers or chills.   Assessment & Plan: Visit Diagnoses:  1. Radiculopathy, cervical region   2. Right shoulder pain, unspecified chronicity     Plan: Impression is right shoulder pain which may be intrinsic shoulder pathology.  She has had an injection and a course of activity modification and rehabilitation exercises none of which have helped.  I did tell the patient that I think it is most likely 70% of her pain is coming from the neck and 30% could be coming from the shoulder.  Plan is to refill Neurontin and MRI arthrogram of that shoulder to evaluate for intra-articular pathology.  Cuff exam is pretty normal today and it does not look like she has a frozen shoulder.  Follow-Up Instructions: Return for after MRI.   Orders:  Orders Placed This Encounter  Procedures  . MR SHOULDER RIGHT W CONTRAST  .  Arthrogram   Meds ordered this encounter  Medications  . gabapentin (NEURONTIN) 100 MG capsule    Sig: Take 1 capsule (100 mg total) by mouth 3 (three) times daily.    Dispense:  40 capsule    Refill:  0      Procedures: No procedures performed   Clinical Data: No additional findings.  Objective: Vital Signs: There were no vitals taken for this visit.  Physical Exam:   Constitutional: Patient appears well-developed HEENT:  Head: Normocephalic Eyes:EOM are normal Neck: Normal range of motion Cardiovascular: Normal rate Pulmonary/chest: Effort normal Neurologic: Patient is alert Skin: Skin is warm Psychiatric: Patient has normal mood and affect    Ortho Exam: Ortho exam demonstrates full active and passive range of motion of the right and left shoulder.  O'Brien's testing equivocal on the right negative on the left.  No discrete AC joint tenderness on the right-hand side.  No restriction of external rotation at 15 degrees of abduction.  Passive range of motion of the right shoulder is 50/100/170.  Motor or sensory function in bilateral upper extremities is intact to C5-T1 dermatomes.  Negative Tinel's cubital tunnel in the elbow.  Neck range of motion mildly tender with rotation but no significant restriction of range of motion.  Specialty Comments:  No specialty comments available.  Imaging: No results found.   PMFS History: Patient Active Problem List   Diagnosis Date Noted  . 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 Costochondral chest pain 04/04/2018  . Pericarditis 09/12/2017  . LBBB (left bundle branch block) 08/08/2017  . Obstructive sleep apnea   . Anxiety   . Asthma   . 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 07/26/2017  . Hypertension 06/21/2017  . Aortic stenosis 06/20/2017  .  Degenerative tear of medial meniscus of right knee 04/21/2016  . High-tone pelvic floor dysfunction 12/26/2013  . Difficult or painful urination 06/22/2012  . Chronic interstitial cystitis 06/22/2012  . Dyspareunia 06/22/2012  . Female pelvic pain 06/22/2012  . Urinary urgency 06/22/2012   Past Medical History:  Diagnosis Date  . Anemia    in the past   . Anxiety   . Asthma   . Depression   . Family history of adverse reaction to anesthesia    mom had nausea  . GERD (gastroesophageal reflux disease)   . Hepatitis    Hepatitis A in 3rd grade?  [name] Hypertension   . PONV (postoperative nausea and vomiting)   . 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 Sleep apnea    uses CPAP, 12, starts at 6    Family History  Problem Relation Age of Onset  . Stroke Mother   . Leukemia Mother   . Stroke Father   . Sleep apnea Father   . Valvular heart disease Brother   . Heart Problems Maternal Aunt   . Valvular heart disease Paternal Grandfather   . Valvular heart disease Maternal Aunt     Past Surgical History:  Procedure Laterality Date  . ABDOMINAL HYSTERECTOMY    . AORTIC VALVE REPLACEMENT N/A 07/26/2017   Procedure: MINIMALLY INVASIVE AORTIC VALVE REPLACEMENT (AVR);  Surgeon: Purcell Nails, MD;  Location: [facility] OR;  Service: Open Heart Surgery;  Laterality: N/A;  . BLADDER SURGERY  2011  . CARDIAC CATHETERIZATION    . CARPAL TUNNEL RELEASE  2012  . CHOLECYSTECTOMY    . RIGHT/LEFT HEART CATH AND CORONARY ANGIOGRAPHY N/A 07/08/2017   Procedure: RIGHT/LEFT HEART CATH AND CORONARY ANGIOGRAPHY;  Surgeon: Tonny Bollman, MD;  Location: [facility] INVASIVE CV LAB;  Service: Cardiovascular;  Laterality: N/A;  . TEE WITHOUT CARDIOVERSION N/A 07/26/2017   Procedure: TRANSESOPHAGEAL ECHOCARDIOGRAM (TEE);  Surgeon: Purcell Nails, MD;  Location: [facility] OR;  Service: Open Heart Surgery;  Laterality: N/A;  . TONSILLECTOMY      Social History   Occupational History  . Not on file  Tobacco Use  . Smoking status: Never Smoker  . Smokeless tobacco: Never Used  Vaping Use  . Vaping Use: Never used  Substance and Sexual Activity  . Alcohol use: No    Alcohol/week: 0.0 standard drinks  . Drug use: No  . Sexual activity: Not on file

## 2020-07-02 ENCOUNTER — Telehealth: Payer: Self-pay | Admitting: Orthopedic Surgery

## 2020-07-02 MED ORDER — ACETAMINOPHEN-CODEINE #3 300-30 MG PO TABS: ORAL_TABLET | ORAL | 0 refills | Status: DC

## 2020-07-02 NOTE — Telephone Encounter (Signed)
See below. IC and discussed.  Dr August Saucer they are asking if something different can be prescribed to help with pain. She was given #15 Norco in April. Also has rx for neurontin. Husband states meds make her feel "loopy" so she either has to be in pain or feel loopy.   Martie Lee can we get them in anywhere else sooner than the 19th? They are requesting scan to be done sooner stating she has been in pain for a while.  York Spaniel they would potentially be willing to travel to [facility], [place], etc to have scan.

## 2020-07-02 NOTE — Telephone Encounter (Signed)
Can you please send to pharmacy.

## 2020-07-02 NOTE — Telephone Encounter (Signed)
No.  She is allergic to Ultram.  Anything else is going to make her feel even more loopy.  If she wants to try Tylenol with codeine we can try that but that would be the only thing we can try.  If she wants that she can do that 1 p.o. every 12 hours as needed pain #20.  Thanks

## 2020-07-02 NOTE — Telephone Encounter (Signed)
IC advised submitted.  

## 2020-07-02 NOTE — Telephone Encounter (Signed)
Miranda Berger pts husband called on her behalf stating they had some questions about her MRI on 07/10/20 and would like a CB to discuss them. Pt states whenever best for Dr. August Saucer to call they will be available.   [phone]

## 2020-07-02 NOTE — Telephone Encounter (Signed)
I spoke to [name] husband and explained to him about the MRI arthogram and he is going to wait until the 19 to have done, also, I told them about the message from Dr. August Saucer regarding the medication and they are ok with taking the tylenol with codeine, pt is wanting to call back once that has been sent to pharmacy so he will know to go pick it up.

## 2020-07-10 ENCOUNTER — Ambulatory Visit
Admission: RE | Admit: 2020-07-10 | Discharge: 2020-07-10 | Disposition: A | Discharge status: Home / Self Care | Payer: BC Managed Care – PPO | Source: Ambulatory Visit | Attending: Orthopedic Surgery | Admitting: Orthopedic Surgery

## 2020-07-10 ENCOUNTER — Other Ambulatory Visit: Payer: Self-pay

## 2020-07-10 DIAGNOSIS — M25511 Pain in right shoulder: Secondary | ICD-10-CM

## 2020-07-10 MED ORDER — IOPAMIDOL (ISOVUE-M 200) INJECTION 41%
12.0000 mL | Freq: Once | INTRAMUSCULAR | Status: AC
  Administered 2020-07-10: 12 mL via INTRA_ARTICULAR

## 2020-07-14 ENCOUNTER — Telehealth: Payer: Self-pay | Admitting: Orthopedic Surgery

## 2020-07-14 ENCOUNTER — Ambulatory Visit: Payer: BC Managed Care – PPO | Admitting: Orthopedic Surgery

## 2020-07-14 ENCOUNTER — Encounter: Payer: Self-pay | Admitting: Orthopedic Surgery

## 2020-07-14 ENCOUNTER — Other Ambulatory Visit: Payer: Self-pay

## 2020-07-14 DIAGNOSIS — M5412 Radiculopathy, cervical region: Secondary | ICD-10-CM | POA: Diagnosis not present

## 2020-07-14 NOTE — Telephone Encounter (Signed)
Please advise 

## 2020-07-14 NOTE — Telephone Encounter (Signed)
Pt called stating she forgot to ask dr August Saucer what he wanted to do about her hydrocodone and tylenol 3; pt would like a CB to discuss if he planned on sending anything in.   [phone]

## 2020-07-15 ENCOUNTER — Telehealth: Payer: Self-pay | Admitting: Orthopedic Surgery

## 2020-07-15 NOTE — Telephone Encounter (Signed)
Patient's husband [name] called about his wife referral. They waiting for a call and never received one. Please call patient concerning referral Dr. August Saucer sent on behalf of patient. Phone number is [phone].

## 2020-07-15 NOTE — Telephone Encounter (Signed)
I can refill it sometime later today can you call her and send a message back to me thanks

## 2020-07-15 NOTE — Telephone Encounter (Signed)
Spoke with patient and let him know the order was just put in yesterday We are working as quickly as we can to get her order referred

## 2020-07-15 NOTE — Telephone Encounter (Signed)
Lvm informing pt.

## 2020-07-16 ENCOUNTER — Other Ambulatory Visit: Payer: Self-pay | Admitting: Orthopedic Surgery

## 2020-07-16 MED ORDER — ACETAMINOPHEN-CODEINE #3 300-30 MG PO TABS: ORAL_TABLET | ORAL | 0 refills | Status: DC

## 2020-07-16 NOTE — Telephone Encounter (Signed)
PA completed.

## 2020-07-16 NOTE — Telephone Encounter (Signed)
I called pt and informed . She stated rx needs PA. I will work on this.

## 2020-07-16 NOTE — Telephone Encounter (Signed)
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 I will try to call the insurance tomorrow. I called pt and advised and she was ok with this.

## 2020-07-16 NOTE — Telephone Encounter (Signed)
T3 sent in pls call thx

## 2020-07-17 ENCOUNTER — Telehealth: Payer: Self-pay | Admitting: Orthopedic Surgery

## 2020-07-17 NOTE — Telephone Encounter (Signed)
Need to get auth through [employer]. They dont open for another . Will call soon

## 2020-07-17 NOTE — Telephone Encounter (Signed)
I called pt and let her know we are working on this. Can you please let me know once it is sent so I can call pt back to inform her? Thank you

## 2020-07-17 NOTE — Telephone Encounter (Signed)
Received approval for med

## 2020-07-17 NOTE — Telephone Encounter (Signed)
Can you please help me with this?

## 2020-07-17 NOTE — Telephone Encounter (Signed)
Pt and husband called stating they had gotten in touch with the referral office of Dr. Wynetta Emery and they had not received the records and notes they were requesting. Pt was told referral was put in 07/14/20 and they stated if the information could be resent as Dr. Lonie Peak office did not received them. Pt came in 07/17/20 around lunch time to speak with a front office rep then called again at 1:24pm to receive an update again. Pt is anxious to get these sent over and was explained that referrals can take time to transfer. A call back number is [phone].

## 2020-07-17 NOTE — Telephone Encounter (Signed)
Given a different key code for covermymeds. PA completed

## 2020-07-17 NOTE — Telephone Encounter (Signed)
Called pharmacy to inform

## 2020-07-18 ENCOUNTER — Telehealth: Payer: Self-pay

## 2020-07-18 NOTE — Telephone Encounter (Signed)
Patients husband called regarding the last message , he is requesting patients medical records he is requesting a call back:[phone]

## 2020-07-18 NOTE — Telephone Encounter (Signed)
Husband stated Dr.Cram needs demographics,scan report, injection records,office notes and insurance info patients husband is requesting this to be done today sp he can give the records to Dr.Cram

## 2020-07-21 ENCOUNTER — Encounter: Payer: Self-pay | Admitting: Orthopedic Surgery

## 2020-07-21 NOTE — Progress Notes (Signed)
Office Visit Note   Patient: Miranda Berger           Date of Birth: 1956/02/04           MRN: [id number] Visit Date: 07/14/2020 Requested by: Shirlean Mylar, MD [address] [place],  [place] [address] PCP: Shirlean Mylar, MD  Subjective: Chief Complaint  Patient presents with  . Neck - Pain    HPI: Miranda Berger is a 65 y.o. female who presents to the office complaining of continued right shoulder pain.  She is here to review MRI of the right shoulder which revealed small SLAP tear.  She complains of continued constant pain through the right shoulder that she localizes above her shoulder blade with associated neck pain and radiation down the axial cervical spine into the thoracic spine between both shoulder blades.  She has constant pain that is worse through the day.  She takes hydrocodone at night which helps as well as Tylenol No. 3 during the day.  She has radiation of pain down her arm into her fingers.  She does feel her right arm is weak compared with her left arm.  No left-sided symptoms.  She has no difficulties with her gait or with balance.  Denies any history of neck or shoulder surgery.  She has had cervical spine ESI's twice.  First 1 provided no relief but the second 1 provided 50% relief for about 1 day.  She has had prior MRI of the cervical spine mild multilevel cervical spondylosis with mild spinal stenosis at C6-C7, mild to moderate right C5 foraminal narrowing with mild left C6 foraminal narrowing as well as moderate facet degeneration at C3-C4 on the left and C4-C5 on the right..                ROS: All systems reviewed are negative as they relate to the chief complaint within the history of present illness.  Patient denies fevers or chills.  Assessment & Plan: Visit Diagnoses:  1. Radiculopathy, cervical region     Plan: Patient is a 65 year old female who presents complaining of continued right shoulder pain.  She is here to review MRI of the  right shoulder which revealed SLAP tear with mild tendinosis of the supraspinatus and infraspinatus.  She complains of continued constant pain in her shoulder.  By her history and exam it seems most of her pain is coming from her cervical spine given the referred pain into the shoulder blade and down her cervical spine as well as the radicular pain down the entirety of her right arm.  She has some weakness of grip strength, supination, bicep flexion on exam today as well as reproduction of her pain with cervical spine range of motion.  She has had improvement in the past with ESI's.  Impression is that the majority of her pain is referred from the neck.  Plan to refer patient to Dr. Wynetta Emery for further evaluation and potential treatment of right arm radiculopathy.  No indication for shoulder intervention at this time pending neurosurgical evaluation.  Follow-Up Instructions: No follow-ups on file.   Orders:  Orders Placed This Encounter  Procedures  . Ambulatory referral to Neurosurgery   No orders of the defined types were placed in this encounter.     Procedures: No procedures performed   Clinical Data: No additional findings.  Objective: Vital Signs: There were no vitals taken for this visit.  Physical Exam:  Constitutional: Patient appears well-developed HEENT:  Head: Normocephalic Eyes:EOM are normal Neck: Normal range of motion Cardiovascular: Normal rate Pulmonary/chest: Effort normal Neurologic: Patient is alert Skin: Skin is warm Psychiatric: Patient has normal mood and affect  Ortho Exam: Ortho exam demonstrates 50 degrees external rotation, 1 to 5 degrees abduction, 175 degrees forward flexion of the right shoulder.  No crepitus noted with passive motion of the shoulder.  4/5 motor strength of grip strength, supination, bicep flexion.  5/5 motor strength of finger abduction, pronation, tricep, deltoid.  No weakness of the supraspinatus, infraspinatus, subscapularis of the  right shoulder.  Positive Spurling sign noted.  Looking directly up into her right side reproduces her shoulder pain.  Tenderness throughout the axial cervical spine.  Specialty Comments:  No specialty comments available.  Imaging: No results found.   PMFS History: Patient Active Problem List   Diagnosis Date Noted  . 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 Costochondral chest pain 04/04/2018  . Pericarditis 09/12/2017  . LBBB (left bundle branch block) 08/08/2017  . Obstructive sleep apnea   . Anxiety   . Asthma   . 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 07/26/2017  . Hypertension 06/21/2017  . Aortic stenosis 06/20/2017  . Degenerative tear of medial meniscus of right knee 04/21/2016  . High-tone pelvic floor dysfunction 12/26/2013  . Difficult or painful urination 06/22/2012  . Chronic interstitial cystitis 06/22/2012  . Dyspareunia 06/22/2012  . Female pelvic pain 06/22/2012  . Urinary urgency 06/22/2012   Past Medical History:  Diagnosis Date  . Anemia    in the past   . Anxiety   . Asthma   . Depression   . Family history of adverse reaction to anesthesia    mom had nausea  . GERD (gastroesophageal reflux disease)   . Hepatitis    Hepatitis A in 3rd grade?  [name] Hypertension   . PONV (postoperative nausea and vomiting)   . 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 Sleep apnea    uses CPAP, 12, starts at 6    Family History  Problem Relation Age of Onset  . Stroke Mother   . Leukemia Mother   . Stroke Father   . Sleep apnea Father   . Valvular heart disease Brother   . Heart Problems Maternal Aunt   . Valvular heart disease Paternal Grandfather   . Valvular heart disease Maternal Aunt     Past Surgical History:   Procedure Laterality Date  . ABDOMINAL HYSTERECTOMY    . AORTIC VALVE REPLACEMENT N/A 07/26/2017   Procedure: MINIMALLY INVASIVE AORTIC VALVE REPLACEMENT (AVR);  Surgeon: Purcell Nails, MD;  Location: [facility] OR;  Service: Open Heart Surgery;  Laterality: N/A;  . BLADDER SURGERY  2011  . CARDIAC CATHETERIZATION    . CARPAL TUNNEL RELEASE  2012  . CHOLECYSTECTOMY    . RIGHT/LEFT HEART CATH AND CORONARY ANGIOGRAPHY N/A 07/08/2017   Procedure: RIGHT/LEFT HEART CATH AND CORONARY ANGIOGRAPHY;  Surgeon: Tonny Bollman, MD;  Location[facility] INVASIVE CV LAB;  Service: Cardiovascular;  Laterality: N/A;  . TEE WITHOUT CARDIOVERSION N/A 07/26/2017   Procedure: TRANSESOPHAGEAL ECHOCARDIOGRAM (TEE);  Surgeon: Purcell Nails, MD;  Location[facility] OR;  Service: Open Heart Surgery;  Laterality: N/A;  . TONSILLECTOMY     Social History   Occupational History  . Not on file  Tobacco Use  . Smoking status: Never Smoker  . Smokeless tobacco: Never Used  Vaping Use  . Vaping Use: Never used  Substance and Sexual Activity  . Alcohol use: No    Alcohol/week: 0.0 standard drinks  . Drug use: No  . Sexual activity: Not on file

## 2020-07-22 NOTE — Telephone Encounter (Signed)
Process by referral dept.

## 2020-07-24 NOTE — Telephone Encounter (Signed)
Per [facility] pt is scheduled 07/24/20 at 11:15am with Dr. Wynetta Emery

## 2020-09-12 ENCOUNTER — Telehealth: Payer: Self-pay

## 2020-09-12 NOTE — Telephone Encounter (Signed)
Left detailed message for Surgery Scheduler, Erie Noe at [facility] that this is not enough time before pt's procedure to hold Aspirin and to call us when they have a new date.

## 2020-09-12 NOTE — Telephone Encounter (Signed)
Dr. Dulce Sellar has not yet given recommendations.

## 2020-09-12 NOTE — Telephone Encounter (Signed)
Erie Noe from [facility] is returning call to J. Witty in regards to pts's upcoming pre op clearance. Please Einar Gip at [phone]

## 2020-09-12 NOTE — Telephone Encounter (Signed)
Patient's husband calling back. He states this clearance needs to be done today. He states the patient has already held the aspirin for a week. He was frustrated and requests a call back to give an update.

## 2020-09-12 NOTE — Telephone Encounter (Signed)
    Pt's spouse is calling to follow up, procedure is on Monday. Both the pt and husband is anxious and trying to get clearance before monday

## 2020-09-12 NOTE — Telephone Encounter (Signed)
Miranda Berger form the surgeon's office has been made aware that it's not enough time to hold the Aspirin.  Miranda Berger informed me that the pt had already stopped her Aspirin as of Monday, 09/08/2020.    Is she cleared from the other aspect of  Cardiology?

## 2020-09-12 NOTE — Telephone Encounter (Signed)
   Patient Name: Miranda Berger  DOB: 1956-01-13 MRN: [id number]  Primary Cardiologist: Norman Herrlich, MD  Chart reviewed as part of pre-operative protocol coverage.   Preoperative clearance request sent 7/22 for multiple cervical fusions on 09/15/2020.  The patient is on ASA 81 mg daily with request for hold specifications.  Based on the day of the upcoming procedure, ASA cannot be held for sufficient time.  Routing back to the preop pool to notify the surgeon that the request does not allow sufficient hold time for antiplatelet therapy.   Lennon Alstrom, PA-C 09/12/2020, 11:30 AM

## 2020-09-12 NOTE — Telephone Encounter (Signed)
   [facility] Pre-operative Risk Assessment    Request for surgical clearance:  What type of surgery is being performed? C4-5,C5-6,C 6-7 Anterior Cervical Fusion  When is this surgery scheduled? 09/15/2020   What type of clearance is required (medical clearance vs. Pharmacy clearance to hold med vs. Both)? Both  Are there any medications that need to be held prior to surgery and how long?Patient is on ASA 81, holding length not specified    Practice name and name of physician performing surgery? Dr. Kary Kos at [facility]   What is your office phone number: [phone]    7.   What is your office fax number: [phone] Att: Lorriane Shire  8.   Anesthesia type (None, local, MAC, general) ? General Anesthesia   Miranda Berger 09/12/2020, 8:20 AM  _________________________________________________________________   (provider comments below)

## 2020-09-12 NOTE — Telephone Encounter (Signed)
    Patient Name: Miranda Berger  DOB: [birth year] MRN: [id number]   Primary Cardiologist: Norman Herrlich, MD   Chart reviewed as part of pre-operative protocol coverage. Preoperative clearance request sent 7/22 for multiple cervical fusions on 09/15/2020.    The patient is on ASA 81 mg daily with request for hold specifications. ASA held 7/18 and per Dr. Dulce Sellar, pt can hold ASA.   Pt was last seen in office 04/01/20 by Dr. Dulce Sellar. Since then, she has had R shoulder pain and associated neck and cervical pain with the procedure as above scheduled but no report of sx concerning for angina. Based on review of pt chart, discussion with Dr. Dulce Sellar, and rassessment of sx, reasonable to proceed with the below procedure  without additional cardiac testing or intervention.    Recommend discontinuing ASA 5-7 days before the procedure.  Reinitiation of ASA should be done as soon as it is felt safe to do so after surgery.    Lennon Alstrom, PA-C 09/12/2020, 11:30 AM

## 2020-10-23 ENCOUNTER — Encounter: Payer: Self-pay | Admitting: Adult Health

## 2020-10-23 ENCOUNTER — Ambulatory Visit: Payer: BC Managed Care – PPO | Admitting: Adult Health

## 2020-10-23 ENCOUNTER — Other Ambulatory Visit: Payer: Self-pay

## 2020-10-23 ENCOUNTER — Telehealth: Payer: Self-pay | Admitting: *Deleted

## 2020-10-23 VITALS — BP 122/82 | HR 98 | Ht 63.0 in | Wt 162.4 lb

## 2020-10-23 DIAGNOSIS — Z9989 Dependence on other enabling machines and devices: Secondary | ICD-10-CM | POA: Diagnosis not present

## 2020-10-23 DIAGNOSIS — G4733 Obstructive sleep apnea (adult) (pediatric): Secondary | ICD-10-CM

## 2020-10-23 NOTE — Progress Notes (Signed)
PATIENT: Miranda Berger DOB: 1956/01/24  REASON FOR VISIT: follow up HISTORY FROM: patient  HISTORY OF PRESENT ILLNESS: Today 10/23/20: Miranda Berger is a 65 year old female with a history of obstructive sleep apnea on CPAP.  Her download indicates that she used her machine 28 out of 30 days for compliance of 93%.  She used her machine greater than 4 hours for compliance of 80%.  On average she uses her machine 5 hours and 37 minutes.  Her residual AHI is 1.3 on 13 cm of water.  She denies any new issues with her machine she returns today for an evaluation.   10/23/19: Miranda Berger is a 65 year old female with a history of obstructive sleep apnea on CPAP.  Her download indicates that she use her machine nightly for compliance of 100%.  She used her machine greater than 4 hours each night.  On average she uses her machine 7 hours and 13 minutes.  Her residual AHI is 0.9 on 13 cm of water with EPR of 2.  Reports that the CPAP works well for her.  HISTORY 10/23/18:   Miranda Berger is a 65 year old female with a history of obstructive sleep apnea on CPAP.  Her download indicates that she use her machine 30 out of 30 days for compliance of 100%.  She used her machine greater than 4 hours each night.  On average she uses her machine 8 hours and 57 minutes.  Her residual AHI is 0.8 on 13 cmH2O. she reports that the CPAP continues to work well for her.  She states that she has been dealing with anxiety and depression over the last year.  She states that sometimes she finds it hard to use the mask due to claustrophobia.  Otherwise she is doing well.  She returns today for an evaluation.  REVIEW OF SYSTEMS: Out of a complete 14 system review of symptoms, the patient complains only of the following symptoms, and all other reviewed systems are negative.  FSS 9 ESS 3  ALLERGIES: Allergies  Allergen Reactions   Codeine Itching   Prednisone Itching    HOME MEDICATIONS: Outpatient Medications Prior to Visit   Medication Sig Dispense Refill   acebutolol (SECTRAL) 200 MG capsule Take 1 capsule (200 mg total) by mouth every other day. 90 capsule 2   albuterol (PROVENTIL HFA;VENTOLIN HFA) 108 (90 Base) MCG/ACT inhaler Inhale 2 puffs into the lungs every 6 (six) hours as needed for wheezing or shortness of breath.     ALPRAZolam (XANAX) 0.5 MG tablet Take 0.25 mg by mouth in the morning and at bedtime.      aspirin EC 81 MG tablet Take 81 mg by mouth daily.     buPROPion (WELLBUTRIN XL) 300 MG 24 hr tablet Take 1 tablet (300 mg total) by mouth daily. 30 tablet 0   calcium carbonate (OS-CAL) 600 MG TABS tablet Take 600 mg by mouth daily.      fluticasone (FLONASE) 50 MCG/ACT nasal spray Place 1 spray into both nostrils daily as needed for allergies.      Fluticasone-Salmeterol (ADVAIR) 250-50 MCG/DOSE AEPB Inhale 1 puff into the lungs 2 (two) times daily.     Loratadine (CLARITIN) 10 MG CAPS Take 1 capsule by mouth daily as needed.     methocarbamol (ROBAXIN) 500 MG tablet Take 1 tablet (500 mg total) by mouth every 8 (eight) hours as needed for muscle spasms. 30 tablet 0   mirtazapine (REMERON) 15 MG tablet Take 7.5 mg by  mouth at bedtime.      Multiple Vitamin (MULTIVITAMIN) capsule Take 1 capsule by mouth daily.     acetaminophen-codeine (TYLENOL #3) 300-30 MG tablet 1 po q 12 hrs prn 30 tablet 0   gabapentin (NEURONTIN) 100 MG capsule Take 1 capsule (100 mg total) by mouth 3 (three) times daily. 40 capsule 0   meloxicam (MOBIC) 15 MG tablet Take 1 tablet (15 mg total) by mouth daily. 30 tablet 0   predniSONE (DELTASONE) 10 MG tablet Take 2 tablets (20 mg total) by mouth 2 (two) times daily. 20 tablet 0   No facility-administered medications prior to visit.    PAST MEDICAL HISTORY: Past Medical History:  Diagnosis Date   Anemia    in the past    Anxiety    Asthma    Depression    Family history of adverse reaction to anesthesia    mom had nausea   GERD (gastroesophageal reflux disease)     Hepatitis    Hepatitis A in 3rd grade?   Hypertension    PONV (postoperative nausea and vomiting)    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 07/26/2017   [employer], size 23   Sleep apnea    uses CPAP, 12, starts at 6    PAST SURGICAL HISTORY: Past Surgical History:  Procedure Laterality Date   ABDOMINAL HYSTERECTOMY     AORTIC VALVE REPLACEMENT N/A 07/26/2017   Procedure: MINIMALLY INVASIVE AORTIC VALVE REPLACEMENT (AVR);  Surgeon: Purcell Nails, MD;  Location: [facility] OR;  Service: Open Heart Surgery;  Laterality: N/A;   BLADDER SURGERY  2011   CARDIAC CATHETERIZATION     CARPAL TUNNEL RELEASE  2012   CHOLECYSTECTOMY     RIGHT/LEFT HEART CATH AND CORONARY ANGIOGRAPHY N/A 07/08/2017   Procedure: RIGHT/LEFT HEART CATH AND CORONARY ANGIOGRAPHY;  Surgeon: Tonny Bollman, MD;  Location: [facility] INVASIVE CV LAB;  Service: Cardiovascular;  Laterality: N/A;   TEE WITHOUT CARDIOVERSION N/A 07/26/2017   Procedure: TRANSESOPHAGEAL ECHOCARDIOGRAM (TEE);  Surgeon: Purcell Nails, MD;  Location: [facility] OR;  Service: Open Heart Surgery;  Laterality: N/A;   TONSILLECTOMY      FAMILY HISTORY: Family History  Problem Relation Age of Onset   Stroke Mother    Leukemia Mother    Stroke Father    Sleep apnea Father    Valvular heart disease Brother    Heart Problems Maternal Aunt    Valvular heart disease Paternal Grandfather    Valvular heart disease Maternal Aunt     SOCIAL HISTORY: Social History   Socioeconomic History   Marital status: Married    Spouse name: Not on file   Number of children: 1   Years of education: BS   Highest education level: Not on file  Occupational History   Not on file  Tobacco Use   Smoking status: Never   Smokeless tobacco: Never  Vaping Use   Vaping Use: Never used  Substance and Sexual Activity   Alcohol use: No    Alcohol/week: 0.0 standard drinks   Drug use: No   Sexual activity: Not on file  Other Topics Concern    Not on file  Social History Narrative   Occasionally consumes tea or coffee   Social Determinants of Health   Financial Resource Strain: Not on file  Food Insecurity: Not on file  Transportation Needs: Not on file  Physical Activity: Not on file  Stress: Not on file  Social Connections: Not on file  Intimate Partner Violence: Not on file      PHYSICAL EXAM  Vitals:   10/23/20 0929  BP: 122/82  Pulse: 98  SpO2: 97%  Weight: 162 lb 6 oz (73.7 kg)  Height: 5\' 3"  (1.6 m)    Body mass index is 28.76 kg/m.  Generalized: Well developed, in no acute distress  Chest: Lungs clear to auscultation bilaterally  Neurological examination  Mentation: Alert oriented to time, place, history taking. Follows all commands speech and language fluent Cranial nerve II-XII: Extraocular movements were full, visual field were full on confrontational test Head turning and shoulder shrug  were normal and symmetric. Motor: The motor testing reveals 5 over 5 strength of all 4 extremities. Good symmetric motor tone is noted throughout.  Sensory: Sensory testing is intact to soft touch on all 4 extremities. No evidence of extinction is noted.  Gait and station: Gait is normal.    DIAGNOSTIC DATA (LABS, IMAGING, TESTING) - I reviewed patient records, labs, notes, testing and imaging myself where available.  Lab Results  Component Value Date   WBC 6.6 01/28/2020   HGB 12.0 01/28/2020   HCT 36.3 01/28/2020   MCV 92.8 01/28/2020   PLT 211 01/28/2020      Component Value Date/Time   NA 140 01/28/2020 1243   NA 143 08/09/2017 0850   K 3.9 01/28/2020 1243   CL 104 01/28/2020 1243   CO2 28 01/28/2020 1243   GLUCOSE 99 01/28/2020 1243   BUN 13 01/28/2020 1243   BUN 14 08/09/2017 0850   CREATININE 0.91 01/28/2020 1243   CALCIUM 10.0 01/28/2020 1243   PROT 7.1 07/10/2018 1733   ALBUMIN 4.7 07/10/2018 1733   AST 17 07/10/2018 1733   ALT 19 07/10/2018 1733   ALKPHOS 53 07/10/2018 1733    BILITOT 0.6 07/10/2018 1733   GFRNONAA >60 01/28/2020 1243   GFRAA >60 07/10/2018 1733   No results found for: CHOL, HDL, LDLCALC, LDLDIRECT, TRIG, CHOLHDL Lab Results  Component Value Date   HGBA1C 5.5 07/22/2017   No results found for: VITAMINB12 Lab Results  Component Value Date   TSH 1.834 07/11/2018      ASSESSMENT AND PLAN 65 y.o. year old female  has a past medical history of Anemia, Anxiety, Asthma, Depression, Family history of adverse reaction to anesthesia, GERD (gastroesophageal reflux disease), Hepatitis, Hypertension, PONV (postoperative nausea and vomiting), 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 (07/26/2017), and Sleep apnea. here with:  OSA on CPAP  - CPAP compliance excellent - Good treatment of AHI  - Encourage patient to use CPAP nightly and > 4 hours each night - F/U in 1 year or sooner if needed   09/25/2017, MSN, NP-C 10/23/2020, 9:28 AM [facility] [address] [place], [place] [phone]

## 2020-10-23 NOTE — Telephone Encounter (Signed)
Patient no showed f/u today with MM NP. This is pt's 1st no show in last year.

## 2021-02-26 DIAGNOSIS — F331 Major depressive disorder, recurrent, moderate: Secondary | ICD-10-CM | POA: Diagnosis not present

## 2021-03-02 DIAGNOSIS — K5900 Constipation, unspecified: Secondary | ICD-10-CM | POA: Diagnosis not present

## 2021-03-02 DIAGNOSIS — K625 Hemorrhage of anus and rectum: Secondary | ICD-10-CM | POA: Diagnosis not present

## 2021-03-11 DIAGNOSIS — F411 Generalized anxiety disorder: Secondary | ICD-10-CM | POA: Diagnosis not present

## 2021-03-17 DIAGNOSIS — H2513 Age-related nuclear cataract, bilateral: Secondary | ICD-10-CM | POA: Diagnosis not present

## 2021-03-17 DIAGNOSIS — H5203 Hypermetropia, bilateral: Secondary | ICD-10-CM | POA: Diagnosis not present

## 2021-03-17 DIAGNOSIS — H524 Presbyopia: Secondary | ICD-10-CM | POA: Diagnosis not present

## 2021-03-17 DIAGNOSIS — H52223 Regular astigmatism, bilateral: Secondary | ICD-10-CM | POA: Diagnosis not present

## 2021-03-18 DIAGNOSIS — F411 Generalized anxiety disorder: Secondary | ICD-10-CM | POA: Diagnosis not present

## 2021-03-23 ENCOUNTER — Encounter: Payer: Self-pay | Admitting: Orthopedic Surgery

## 2021-03-23 ENCOUNTER — Ambulatory Visit (INDEPENDENT_AMBULATORY_CARE_PROVIDER_SITE_OTHER): Payer: Medicare PPO | Admitting: Surgical

## 2021-03-23 ENCOUNTER — Ambulatory Visit (INDEPENDENT_AMBULATORY_CARE_PROVIDER_SITE_OTHER): Payer: Medicare PPO

## 2021-03-23 ENCOUNTER — Other Ambulatory Visit: Payer: Self-pay

## 2021-03-23 DIAGNOSIS — M2021 Hallux rigidus, right foot: Secondary | ICD-10-CM

## 2021-03-23 DIAGNOSIS — F411 Generalized anxiety disorder: Secondary | ICD-10-CM | POA: Diagnosis not present

## 2021-03-23 NOTE — Progress Notes (Signed)
Office Visit Note   Patient: Miranda Berger           Date of Birth: March 01, 1955           MRN: [id number] Visit Date: 03/23/2021 Requested by: Maurice Small, MD [address] [place],  [place] [address] PCP: Maurice Small, MD  Subjective: Chief Complaint  Patient presents with   Right Foot - Follow-up    HPI: Miranda Berger is a 66 y.o. female who presents to the office complaining of right foot pain.  Localizes pain to the first MTP joint.  She has had occasional on and off pain in this area for several years but everything has been significantly worse since she wore tight fitting boots at her son's wedding in late 2022.  Ever since then she has noticed sharp stabbing pains in achiness that even bothers her at rest and at night.  She has been taking Tylenol extra strength along with using Voltaren gel with only minor relief.  She has history of valve replacement and cannot take NSAIDs.  No history of diabetes.  No pain throughout the ankle or rest of the leg.  Denies any numbness or tingling except occasional tingling in the distal great toe with severe pain..                ROS: All systems reviewed are negative as they relate to the chief complaint within the history of present illness.  Patient denies fevers or chills.  Assessment & Plan: Visit Diagnoses:  1. Hallux rigidus, right foot     Plan: Patient is a 66 year old female who presents for evaluation of right foot pain.  Localizes pain to the first MTP joint.  Radiographs today demonstrate severe osteoarthritis of the first MTP joint consistent with hallux rigidus.  Discussed options available to patient.  Cannot take NSAIDs due to history of valve replacement.  She would like to try an intra-articular injection.  She is scheduled for a cruise, leaving on 04/18/2021.  Plan for her to return to the office on 04/10/2021 for ultrasound-guided first MTP joint injection of the right foot.  We will see how this does;  patient wants to avoid any sort of surgical intervention for now.  She had recent cervical spine fusion by Dr. Saintclair Halsted on 09/15/2020 and is recovering well from this.  Follow-Up Instructions: No follow-ups on file.   Orders:  Orders Placed This Encounter  Procedures   XR Foot Complete Right   No orders of the defined types were placed in this encounter.     Procedures: No procedures performed   Clinical Data: No additional findings.  Objective: Vital Signs: There were no vitals taken for this visit.  Physical Exam:  Constitutional: Patient appears well-developed HEENT:  Head: Normocephalic Eyes:EOM are normal Neck: Normal range of motion Cardiovascular: Normal rate Pulmonary/chest: Effort normal Neurologic: Patient is alert Skin: Skin is warm Psychiatric: Patient has normal mood and affect  Ortho Exam: Ortho exam demonstrates right foot with 2+ DP pulse.  Intact dorsiflexion, plantarflexion, inversion, eversion.  No tenderness over the left lateral/medial malleoli.  No tenderness over the Lisfranc complex, fifth metatarsal base, Achilles tendon, Achilles tendon insertion, retrocalcaneal space,.  Tenderness over the first MTP joint.  Increased pain with dorsiflexion and plantarflexion of the first MTP joint.  No pain with range of motion of the IP joint of the right great toe.  Specialty Comments:  No specialty comments available.  Imaging: No results found.  PMFS History: Patient Active Problem List   Diagnosis Date Noted   Major depressive disorder, single episode, severe, with psychosis ([place]) 07/11/2018   MDD (major depressive disorder), recurrent severe, without psychosis ([place]) 07/10/2018   Costochondral chest pain 04/04/2018   Pericarditis 09/12/2017   LBBB (left bundle branch block) 08/08/2017   Obstructive sleep apnea    Anxiety    Asthma    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 07/26/2017   Hypertension 06/21/2017    Aortic stenosis 06/20/2017   Degenerative tear of medial meniscus of right knee 04/21/2016   High-tone pelvic floor dysfunction 12/26/2013   Difficult or painful urination 06/22/2012   Chronic interstitial cystitis 06/22/2012   Dyspareunia 06/22/2012   Female pelvic pain 06/22/2012   Urinary urgency 06/22/2012   Past Medical History:  Diagnosis Date   Anemia    in the past    Anxiety    Asthma    Depression    Family history of adverse reaction to anesthesia    mom had nausea   GERD (gastroesophageal reflux disease)    Hepatitis    Hepatitis A in 3rd grade?   Hypertension    PONV (postoperative nausea and vomiting)    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 07/26/2017   [employer], size 23   Sleep apnea    uses CPAP, 12, starts at 6    Family History  Problem Relation Age of Onset   Stroke Mother    Leukemia Mother    Stroke Father    Sleep apnea Father    Valvular heart disease Brother    Heart Problems Maternal Aunt    Valvular heart disease Paternal Grandfather    Valvular heart disease Maternal Aunt     Past Surgical History:  Procedure Laterality Date   ABDOMINAL HYSTERECTOMY     AORTIC VALVE REPLACEMENT N/A 07/26/2017   Procedure: MINIMALLY INVASIVE AORTIC VALVE REPLACEMENT (AVR);  Surgeon: Rexene Alberts, MD;  Location: [facility];  Service: Open Heart Surgery;  Laterality: N/A;   BLADDER SURGERY  2011   CARDIAC CATHETERIZATION     CARPAL TUNNEL RELEASE  2012   CHOLECYSTECTOMY     RIGHT/LEFT HEART CATH AND CORONARY ANGIOGRAPHY N/A 07/08/2017   Procedure: RIGHT/LEFT HEART CATH AND CORONARY ANGIOGRAPHY;  Surgeon: Sherren Mocha, MD;  Location: [facility];  Service: Cardiovascular;  Laterality: N/A;   TEE WITHOUT CARDIOVERSION N/A 07/26/2017   Procedure: TRANSESOPHAGEAL ECHOCARDIOGRAM (TEE);  Surgeon: Rexene Alberts, MD;  Location: [facility];  Service: Open Heart Surgery;  Laterality: N/A;   TONSILLECTOMY     Social  History   Occupational History   Not on file  Tobacco Use   Smoking status: Never   Smokeless tobacco: Never  Vaping Use   Vaping Use: Never used  Substance and Sexual Activity   Alcohol use: No    Alcohol/week: 0.0 standard drinks   Drug use: No   Sexual activity: Not on file

## 2021-03-30 DIAGNOSIS — F411 Generalized anxiety disorder: Secondary | ICD-10-CM | POA: Diagnosis not present

## 2021-04-10 ENCOUNTER — Other Ambulatory Visit: Payer: Self-pay

## 2021-04-10 ENCOUNTER — Ambulatory Visit: Payer: Self-pay

## 2021-04-10 ENCOUNTER — Ambulatory Visit (INDEPENDENT_AMBULATORY_CARE_PROVIDER_SITE_OTHER): Payer: Medicare PPO | Admitting: Surgical

## 2021-04-10 DIAGNOSIS — M2021 Hallux rigidus, right foot: Secondary | ICD-10-CM | POA: Diagnosis not present

## 2021-04-11 ENCOUNTER — Encounter: Payer: Self-pay | Admitting: Surgical

## 2021-04-11 MED ORDER — LIDOCAINE HCL 1 % IJ SOLN
3.0000 mL | INTRAMUSCULAR | Status: AC | PRN
  Administered 2021-04-10: 3 mL

## 2021-04-11 MED ORDER — BUPIVACAINE HCL 0.25 % IJ SOLN
1.0000 mL | INTRAMUSCULAR | Status: AC | PRN
  Administered 2021-04-10: 1 mL via INTRA_ARTICULAR

## 2021-04-11 MED ORDER — METHYLPREDNISOLONE ACETATE 40 MG/ML IJ SUSP
20.0000 mg | INTRAMUSCULAR | Status: AC | PRN
  Administered 2021-04-10: 20 mg via INTRA_ARTICULAR

## 2021-04-11 NOTE — Progress Notes (Signed)
° °  Procedure Note  Patient: Miranda Berger             Date of Birth: [birth year]           MRN: [id number]             Visit Date: 04/10/2021  Procedures: Visit Diagnoses:  1. Hallux rigidus, right foot     Small Joint Inj: R great MTP on 04/10/2021 8:05 PM Details: 22 G needle, ultrasound-guided medial (Dorsomedial) approach  Spinal Needle: No  Medications: 3 mL lidocaine 1 %; 1 mL bupivacaine 0.25 %; 20 mg methylPREDNISolone acetate 40 MG/ML Procedure, treatment alternatives, risks and benefits explained, specific risks discussed. Consent was given by the patient. Immediately prior to procedure a time out was called to verify the correct patient, procedure, equipment, support staff and site/side marked as required. Patient was prepped and draped in the usual sterile fashion.

## 2021-04-14 DIAGNOSIS — F411 Generalized anxiety disorder: Secondary | ICD-10-CM | POA: Diagnosis not present

## 2021-04-27 DIAGNOSIS — F411 Generalized anxiety disorder: Secondary | ICD-10-CM | POA: Diagnosis not present

## 2021-04-30 DIAGNOSIS — F331 Major depressive disorder, recurrent, moderate: Secondary | ICD-10-CM | POA: Diagnosis not present

## 2021-05-04 DIAGNOSIS — F411 Generalized anxiety disorder: Secondary | ICD-10-CM | POA: Diagnosis not present

## 2021-05-11 DIAGNOSIS — F411 Generalized anxiety disorder: Secondary | ICD-10-CM | POA: Diagnosis not present

## 2021-05-14 DIAGNOSIS — R059 Cough, unspecified: Secondary | ICD-10-CM | POA: Diagnosis not present

## 2021-05-14 DIAGNOSIS — Z03818 Encounter for observation for suspected exposure to other biological agents ruled out: Secondary | ICD-10-CM | POA: Diagnosis not present

## 2021-05-14 DIAGNOSIS — J45901 Unspecified asthma with (acute) exacerbation: Secondary | ICD-10-CM | POA: Diagnosis not present

## 2021-05-14 DIAGNOSIS — J019 Acute sinusitis, unspecified: Secondary | ICD-10-CM | POA: Diagnosis not present

## 2021-05-14 DIAGNOSIS — R5383 Other fatigue: Secondary | ICD-10-CM | POA: Diagnosis not present

## 2021-05-20 ENCOUNTER — Other Ambulatory Visit (HOSPITAL_BASED_OUTPATIENT_CLINIC_OR_DEPARTMENT_OTHER): Payer: Self-pay | Admitting: Family Medicine

## 2021-05-20 ENCOUNTER — Ambulatory Visit (HOSPITAL_BASED_OUTPATIENT_CLINIC_OR_DEPARTMENT_OTHER)
Admission: RE | Admit: 2021-05-20 | Discharge: 2021-05-20 | Disposition: A | Discharge status: Home / Self Care | Payer: Medicare PPO | Source: Ambulatory Visit | Attending: Family Medicine | Admitting: Family Medicine

## 2021-05-20 DIAGNOSIS — Z9889 Other specified postprocedural states: Secondary | ICD-10-CM | POA: Diagnosis not present

## 2021-05-20 DIAGNOSIS — M542 Cervicalgia: Secondary | ICD-10-CM

## 2021-05-20 DIAGNOSIS — S0990XA Unspecified injury of head, initial encounter: Secondary | ICD-10-CM | POA: Insufficient documentation

## 2021-05-20 DIAGNOSIS — R519 Headache, unspecified: Secondary | ICD-10-CM | POA: Diagnosis not present

## 2021-05-25 DIAGNOSIS — F411 Generalized anxiety disorder: Secondary | ICD-10-CM | POA: Diagnosis not present

## 2021-06-01 DIAGNOSIS — F411 Generalized anxiety disorder: Secondary | ICD-10-CM | POA: Diagnosis not present

## 2021-06-11 DIAGNOSIS — K921 Melena: Secondary | ICD-10-CM | POA: Diagnosis not present

## 2021-06-11 DIAGNOSIS — K648 Other hemorrhoids: Secondary | ICD-10-CM | POA: Diagnosis not present

## 2021-06-15 DIAGNOSIS — F411 Generalized anxiety disorder: Secondary | ICD-10-CM | POA: Diagnosis not present

## 2021-06-29 DIAGNOSIS — F411 Generalized anxiety disorder: Secondary | ICD-10-CM | POA: Diagnosis not present

## 2021-07-13 DIAGNOSIS — F411 Generalized anxiety disorder: Secondary | ICD-10-CM | POA: Diagnosis not present

## 2021-07-23 DIAGNOSIS — F331 Major depressive disorder, recurrent, moderate: Secondary | ICD-10-CM | POA: Diagnosis not present

## 2021-07-27 DIAGNOSIS — F411 Generalized anxiety disorder: Secondary | ICD-10-CM | POA: Diagnosis not present

## 2021-07-30 DIAGNOSIS — R7309 Other abnormal glucose: Secondary | ICD-10-CM | POA: Diagnosis not present

## 2021-07-30 DIAGNOSIS — G4733 Obstructive sleep apnea (adult) (pediatric): Secondary | ICD-10-CM | POA: Diagnosis not present

## 2021-07-30 DIAGNOSIS — R140 Abdominal distension (gaseous): Secondary | ICD-10-CM | POA: Diagnosis not present

## 2021-07-30 DIAGNOSIS — R0981 Nasal congestion: Secondary | ICD-10-CM | POA: Diagnosis not present

## 2021-07-30 DIAGNOSIS — Z952 Presence of prosthetic heart valve: Secondary | ICD-10-CM | POA: Diagnosis not present

## 2021-07-30 DIAGNOSIS — R5383 Other fatigue: Secondary | ICD-10-CM | POA: Diagnosis not present

## 2021-07-30 DIAGNOSIS — J45909 Unspecified asthma, uncomplicated: Secondary | ICD-10-CM | POA: Diagnosis not present

## 2021-07-31 DIAGNOSIS — R140 Abdominal distension (gaseous): Secondary | ICD-10-CM | POA: Diagnosis not present

## 2021-07-31 DIAGNOSIS — R7309 Other abnormal glucose: Secondary | ICD-10-CM | POA: Diagnosis not present

## 2021-08-03 DIAGNOSIS — F411 Generalized anxiety disorder: Secondary | ICD-10-CM | POA: Diagnosis not present

## 2021-08-17 DIAGNOSIS — F411 Generalized anxiety disorder: Secondary | ICD-10-CM | POA: Diagnosis not present

## 2021-08-24 DIAGNOSIS — F411 Generalized anxiety disorder: Secondary | ICD-10-CM | POA: Diagnosis not present

## 2021-09-09 ENCOUNTER — Telehealth: Payer: Self-pay | Admitting: Cardiology

## 2021-09-09 DIAGNOSIS — F411 Generalized anxiety disorder: Secondary | ICD-10-CM | POA: Diagnosis not present

## 2021-09-09 NOTE — Telephone Encounter (Signed)
*  STAT* If patient is at the pharmacy, call can be transferred to refill team.   1. Which medications need to be refilled? (please list name of each medication and dose if known)   Amoxillin  2. Which pharmacy/location (including street and city if local pharmacy) is medication to be sent to?  [id number] - [place], [place] - [address] AT [address]  3. Do they need a 30 day or 90 day supply?   2 pills short  Patient called stating she is 2 pills short of this medication prior to her going to her dental procedure.

## 2021-09-10 ENCOUNTER — Other Ambulatory Visit: Payer: Self-pay

## 2021-09-10 ENCOUNTER — Telehealth: Payer: Self-pay

## 2021-09-10 MED ORDER — AMOXICILLIN 500 MG PO CAPS: ORAL_CAPSULE | ORAL | 0 refills | Status: DC

## 2021-09-10 NOTE — Telephone Encounter (Signed)
Patient called back to follow-up on the status on refilling this medication.

## 2021-09-10 NOTE — Telephone Encounter (Signed)
Sent Amoxil 500mg  #20 2g 71min-1hr prior to dentist appt to [facility]

## 2021-09-11 ENCOUNTER — Other Ambulatory Visit: Payer: Self-pay

## 2021-09-11 MED ORDER — AMOXICILLIN 500 MG PO CAPS: ORAL_CAPSULE | ORAL | 0 refills | Status: DC

## 2021-09-11 NOTE — Telephone Encounter (Signed)
Spoke with Dr. Dulce Sellar regarding the patient needing to refill her amoxicillin because she was 2 pills short of this medication prior to going to her dental procedure. Dr. Dulce Sellar gave orders to refill her amoxicillin. Orders for amoxicillin were placed in Epic and sent to her pharmacy. Patient had no further questions at this time.

## 2021-09-28 DIAGNOSIS — F411 Generalized anxiety disorder: Secondary | ICD-10-CM | POA: Diagnosis not present

## 2021-09-29 DIAGNOSIS — U071 COVID-19: Secondary | ICD-10-CM | POA: Diagnosis not present

## 2021-10-01 ENCOUNTER — Ambulatory Visit: Payer: Medicare PPO | Admitting: Cardiology

## 2021-10-12 DIAGNOSIS — F411 Generalized anxiety disorder: Secondary | ICD-10-CM | POA: Diagnosis not present

## 2021-10-15 ENCOUNTER — Ambulatory Visit: Payer: Medicare PPO | Admitting: Adult Health

## 2021-10-15 ENCOUNTER — Encounter: Payer: Self-pay | Admitting: Adult Health

## 2021-10-15 VITALS — BP 127/84 | HR 85 | Ht 63.0 in | Wt 171.0 lb

## 2021-10-15 DIAGNOSIS — F331 Major depressive disorder, recurrent, moderate: Secondary | ICD-10-CM | POA: Diagnosis not present

## 2021-10-15 DIAGNOSIS — Z9989 Dependence on other enabling machines and devices: Secondary | ICD-10-CM | POA: Diagnosis not present

## 2021-10-15 DIAGNOSIS — F3132 Bipolar disorder, current episode depressed, moderate: Secondary | ICD-10-CM | POA: Diagnosis not present

## 2021-10-15 DIAGNOSIS — F410 Panic disorder [episodic paroxysmal anxiety] without agoraphobia: Secondary | ICD-10-CM | POA: Diagnosis not present

## 2021-10-15 DIAGNOSIS — G4733 Obstructive sleep apnea (adult) (pediatric): Secondary | ICD-10-CM | POA: Diagnosis not present

## 2021-10-15 NOTE — Progress Notes (Signed)
PATIENT: Miranda Berger DOB: 07/15/1955  REASON FOR VISIT: follow up HISTORY FROM: patient Primary neurologist: Dr. Frances Furbish  Chief Complaint  Patient presents with   Follow-up    Pt in 19  pt here for CPAP f/u Pt has has no questions or concerns for this visit Pt states she had covid 2 weeks ago      HISTORY OF PRESENT ILLNESS: Today 10/15/21: Miranda Berger is a 66 year old female with a history of obstructive sleep apnea on CPAP.  She returns today for follow-up.  Her download indicates that she used her machine 27 out of 30 days for compliance of 90%.  She used her machine greater than 4 hours for compliance of 66.7%.  On average she uses the machine 5 hours and 34 minutes.  Her residual AHI is 0.8 on 13 cm of water.  She reports that the CPAP does work well for her.  She states over the last month she had COVID and upper respiratory infection therefore she was unable to use it longer than 4 hours some nights.  She also states that she now has a diagnosis of chronic fatigue syndrome.  She returns today for an evaluation.    10/23/20: Miranda Berger is a 66 year old female with a history of obstructive sleep apnea on CPAP.  Her download indicates that she used her machine 28 out of 30 days for compliance of 93%.  She used her machine greater than 4 hours for compliance of 80%.  On average she uses her machine 5 hours and 37 minutes.  Her residual AHI is 1.3 on 13 cm of water.  She denies any new issues with her machine she returns today for an evaluation.   10/23/19: Miranda Berger is a 66 year old female with a history of obstructive sleep apnea on CPAP.  Her download indicates that she use her machine nightly for compliance of 100%.  She used her machine greater than 4 hours each night.  On average she uses her machine 7 hours and 13 minutes.  Her residual AHI is 0.9 on 13 cm of water with EPR of 2.  Reports that the CPAP works well for her.  HISTORY 10/23/18:   Miranda Berger is a 66 year old female with  a history of obstructive sleep apnea on CPAP.  Her download indicates that she use her machine 30 out of 30 days for compliance of 100%.  She used her machine greater than 4 hours each night.  On average she uses her machine 8 hours and 57 minutes.  Her residual AHI is 0.8 on 13 cmH2O. she reports that the CPAP continues to work well for her.  She states that she has been dealing with anxiety and depression over the last year.  She states that sometimes she finds it hard to use the mask due to claustrophobia.  Otherwise she is doing well.  She returns today for an evaluation.  REVIEW OF SYSTEMS: Out of a complete 14 system review of symptoms, the patient complains only of the following symptoms, and all other reviewed systems are negative.  FSS 46 ESS 19 due to chronic fatigue  ALLERGIES: Allergies  Allergen Reactions   Codeine Itching   Prednisone Itching    HOME MEDICATIONS: Outpatient Medications Prior to Visit  Medication Sig Dispense Refill   albuterol (PROVENTIL HFA;VENTOLIN HFA) 108 (90 Base) MCG/ACT inhaler Inhale 2 puffs into the lungs every 6 (six) hours as needed for wheezing or shortness of breath.     ALPRAZolam (  XANAX) 0.5 MG tablet Take 0.25 mg by mouth in the morning and at bedtime.     amoxicillin (AMOXIL) 500 MG capsule 2g by mouth - 1 hr prior to dentist appt 20 capsule 0   aspirin EC 81 MG tablet Take 81 mg by mouth daily.     buPROPion (WELLBUTRIN XL) 300 MG 24 hr tablet Take 1 tablet (300 mg total) by mouth daily. 30 tablet 0   calcium carbonate (OS-CAL) 600 MG TABS tablet Take 600 mg by mouth daily.      fluticasone (FLONASE) 50 MCG/ACT nasal spray Place 1 spray into both nostrils daily as needed for allergies.      Fluticasone-Salmeterol (ADVAIR) 250-50 MCG/DOSE AEPB Inhale 1 puff into the lungs 2 (two) times daily.     Loratadine 10 MG CAPS Take 1 capsule by mouth daily as needed.     mirtazapine (REMERON) 15 MG tablet Take 7.5 mg by mouth at bedtime.       Multiple Vitamin (MULTIVITAMIN) capsule Take 1 capsule by mouth daily.     acebutolol (SECTRAL) 200 MG capsule Take 1 capsule (200 mg total) by mouth every other day. (Patient not taking: Reported on 10/15/2021) 90 capsule 2   amoxicillin (AMOXIL) 500 MG capsule 2 g (four pills) by mouth 45 minutes  - 1 hour prior to dentist appointment 20 capsule 0   methocarbamol (ROBAXIN) 500 MG tablet Take 1 tablet (500 mg total) by mouth every 8 (eight) hours as needed for muscle spasms. 30 tablet 0   No facility-administered medications prior to visit.    PAST MEDICAL HISTORY: Past Medical History:  Diagnosis Date   Anemia    in the past    Anxiety    Asthma    Depression    Family history of adverse reaction to anesthesia    mom had nausea   GERD (gastroesophageal reflux disease)    Hepatitis    Hepatitis A in 3rd grade?   Hypertension    PONV (postoperative nausea and vomiting)    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 07/26/2017   [employer], size 23   Sleep apnea    uses CPAP, 12, starts at 6    PAST SURGICAL HISTORY: Past Surgical History:  Procedure Laterality Date   ABDOMINAL HYSTERECTOMY     AORTIC VALVE REPLACEMENT N/A 07/26/2017   Procedure: MINIMALLY INVASIVE AORTIC VALVE REPLACEMENT (AVR);  Surgeon: Purcell Nails, MD;  Location: [facility] OR;  Service: Open Heart Surgery;  Laterality: N/A;   BLADDER SURGERY  2011   CARDIAC CATHETERIZATION     CARPAL TUNNEL RELEASE  2012   CHOLECYSTECTOMY     RIGHT/LEFT HEART CATH AND CORONARY ANGIOGRAPHY N/A 07/08/2017   Procedure: RIGHT/LEFT HEART CATH AND CORONARY ANGIOGRAPHY;  Surgeon: Tonny Bollman, MD;  Location: [facility] INVASIVE [facility] LAB;  Service: Cardiovascular;  Laterality: N/A;   TEE WITHOUT CARDIOVERSION N/A 07/26/2017   Procedure: TRANSESOPHAGEAL ECHOCARDIOGRAM (TEE);  Surgeon: Purcell Nails, MD;  Location: [facility] OR;  Service: Open Heart Surgery;  Laterality: N/A;   TONSILLECTOMY      FAMILY  HISTORY: Family History  Problem Relation Age of Onset   Stroke Mother    Leukemia Mother    Stroke Father    Sleep apnea Father    Valvular heart disease Brother    Sleep apnea Brother    Heart Problems Maternal Aunt    Valvular heart disease Maternal Aunt    Valvular heart disease Paternal Grandfather  SOCIAL HISTORY: Social History   Socioeconomic History   Marital status: Married    Spouse name: Not on file   Number of children: 1   Years of education: BS   Highest education level: Not on file  Occupational History   Not on file  Tobacco Use   Smoking status: Never   Smokeless tobacco: Never  Vaping Use   Vaping Use: Never used  Substance and Sexual Activity   Alcohol use: No    Alcohol/week: 0.0 standard drinks of alcohol   Drug use: No   Sexual activity: Not on file  Other Topics Concern   Not on file  Social History Narrative   Occasionally consumes tea or coffee   Social Determinants of Health   Financial Resource Strain: Not on file  Food Insecurity: No Food Insecurity (10/06/2017)   Hunger Vital Sign    Worried About Running Out of Food in the Last Year: Never true    Ran Out of Food in the Last Year: Never true  Transportation Needs: No Transportation Needs (10/06/2017)   PRAPARE - Hydrologist (Medical): No    Lack of Transportation (Non-Medical): No  Physical Activity: Inactive (10/06/2017)   Exercise Vital Sign    Days of Exercise per Week: 0 days    Minutes of Exercise per Session: 0 min  Stress: Stress Concern Present (10/06/2017)   [place]    Feeling of Stress : Rather much  Social Connections: Not on file  Intimate Partner Violence: Not on file      PHYSICAL EXAM  Vitals:   10/15/21 0842  BP: 127/84  Pulse: 85  Weight: 171 lb (77.6 kg)  Height: 5\' 3"  (1.6 m)     Body mass index is 30.29 kg/m.  Generalized: Well developed, in no  acute distress  Chest: Lungs clear to auscultation bilaterally  Neurological examination  Mentation: Alert oriented to time, place, history taking. Follows all commands speech and language fluent Cranial nerve II-XII: Extraocular movements were full, visual field were full on confrontational test Head turning and shoulder shrug  were normal and symmetric. Gait and station: Gait is normal.    DIAGNOSTIC DATA (LABS, IMAGING, TESTING) - I reviewed patient records, labs, notes, testing and imaging myself where available.  Lab Results  Component Value Date   WBC 6.6 01/28/2020   HGB 12.0 01/28/2020   HCT 36.3 01/28/2020   MCV 92.8 01/28/2020   PLT 211 01/28/2020      Component Value Date/Time   NA 140 01/28/2020 1243   NA 143 08/09/2017 0850   K 3.9 01/28/2020 1243   CL 104 01/28/2020 1243   CO2 28 01/28/2020 1243   GLUCOSE 99 01/28/2020 1243   BUN 13 01/28/2020 1243   BUN 14 08/09/2017 0850   CREATININE 0.91 01/28/2020 1243   CALCIUM 10.0 01/28/2020 1243   PROT 7.1 07/10/2018 1733   ALBUMIN 4.7 07/10/2018 1733   AST 17 07/10/2018 1733   ALT 19 07/10/2018 1733   ALKPHOS 53 07/10/2018 1733   BILITOT 0.6 07/10/2018 1733   GFRNONAA >60 01/28/2020 1243   GFRAA >60 07/10/2018 1733   No results found for: "CHOL", "HDL", "LDLCALC", "LDLDIRECT", "TRIG", "CHOLHDL" Lab Results  Component Value Date   HGBA1C 5.5 07/22/2017   No results found for: "VITAMINB12" Lab Results  Component Value Date   TSH 1.834 07/11/2018      ASSESSMENT AND PLAN 66 y.o. year  old female  has a past medical history of Anemia, Anxiety, Asthma, Depression, Family history of adverse reaction to anesthesia, GERD (gastroesophageal reflux disease), Hepatitis, Hypertension, PONV (postoperative nausea and vomiting), 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 (07/26/2017), and Sleep apnea. here with:  OSA on CPAP  - CPAP compliance good encouraged the patient to try to use  machine greater than 4 hours each night - Good treatment of AHI  - Encourage patient to use CPAP nightly and > 4 hours each night - F/U in 1 year or sooner if needed   Ward Givens, MSN, NP-C 10/15/2021, 8:43 AM [facility] [address] [place], [place] [address] [phone]

## 2021-10-15 NOTE — Patient Instructions (Signed)
Continue using CPAP nightly and greater than 4 hours each night °If your symptoms worsen or you develop new symptoms please let us know.  ° °

## 2021-10-21 DIAGNOSIS — N3281 Overactive bladder: Secondary | ICD-10-CM | POA: Diagnosis not present

## 2021-10-21 DIAGNOSIS — Z01419 Encounter for gynecological examination (general) (routine) without abnormal findings: Secondary | ICD-10-CM | POA: Diagnosis not present

## 2021-10-21 DIAGNOSIS — Z6830 Body mass index (BMI) 30.0-30.9, adult: Secondary | ICD-10-CM | POA: Diagnosis not present

## 2021-10-21 DIAGNOSIS — Z1231 Encounter for screening mammogram for malignant neoplasm of breast: Secondary | ICD-10-CM | POA: Diagnosis not present

## 2021-10-27 DIAGNOSIS — F411 Generalized anxiety disorder: Secondary | ICD-10-CM | POA: Diagnosis not present

## 2021-10-28 ENCOUNTER — Ambulatory Visit: Payer: BC Managed Care – PPO | Admitting: Adult Health

## 2021-11-30 ENCOUNTER — Telehealth: Payer: Self-pay | Admitting: Surgical

## 2021-11-30 ENCOUNTER — Telehealth: Payer: Self-pay

## 2021-11-30 NOTE — Telephone Encounter (Signed)
Patient called triage stating that she is in extreme pain and would like to know if she can come in sooner. She has an apt with Lurena Joiner on the 20th but with her not getting any sleep and the pain getting worse would like to know if we can get her in

## 2021-11-30 NOTE — Telephone Encounter (Signed)
See other note

## 2021-11-30 NOTE — Telephone Encounter (Signed)
Pt called stating her pain is getting worse and has an appt for 10/20 and would like to be seen sooner if possible or asking for medical advice. She states she can barely walk. Please call pt at [phone].

## 2021-11-30 NOTE — Telephone Encounter (Signed)
IC patient, no answer. LMVM advising unable to get patient in sooner but she could see Benjiman Core PA-C tomorrow afternoon.

## 2021-12-01 ENCOUNTER — Ambulatory Visit: Payer: Medicare PPO | Admitting: Surgery

## 2021-12-01 ENCOUNTER — Other Ambulatory Visit: Payer: Self-pay

## 2021-12-01 DIAGNOSIS — D649 Anemia, unspecified: Secondary | ICD-10-CM | POA: Insufficient documentation

## 2021-12-01 DIAGNOSIS — K219 Gastro-esophageal reflux disease without esophagitis: Secondary | ICD-10-CM | POA: Insufficient documentation

## 2021-12-01 DIAGNOSIS — Z8489 Family history of other specified conditions: Secondary | ICD-10-CM | POA: Insufficient documentation

## 2021-12-01 DIAGNOSIS — M2021 Hallux rigidus, right foot: Secondary | ICD-10-CM | POA: Diagnosis not present

## 2021-12-01 DIAGNOSIS — K759 Inflammatory liver disease, unspecified: Secondary | ICD-10-CM | POA: Insufficient documentation

## 2021-12-01 DIAGNOSIS — Z8679 Personal history of other diseases of the circulatory system: Secondary | ICD-10-CM | POA: Insufficient documentation

## 2021-12-01 DIAGNOSIS — Z9889 Other specified postprocedural states: Secondary | ICD-10-CM | POA: Insufficient documentation

## 2021-12-01 DIAGNOSIS — F32A Depression, unspecified: Secondary | ICD-10-CM | POA: Insufficient documentation

## 2021-12-01 HISTORY — DX: Personal history of other diseases of the circulatory system: Z86.79

## 2021-12-01 MED ORDER — LIDOCAINE HCL 1 % IJ SOLN
1.0000 mL | INTRAMUSCULAR | Status: AC | PRN
  Administered 2021-12-01: 1 mL

## 2021-12-01 MED ORDER — METHYLPREDNISOLONE ACETATE 40 MG/ML IJ SUSP
20.0000 mg | INTRAMUSCULAR | Status: AC | PRN
  Administered 2021-12-01: 20 mg via INTRA_ARTICULAR

## 2021-12-01 MED ORDER — BUPIVACAINE HCL 0.5 % IJ SOLN
1.0000 mL | INTRAMUSCULAR | Status: AC | PRN
  Administered 2021-12-01: 1 mL via INTRA_ARTICULAR

## 2021-12-01 NOTE — Progress Notes (Signed)
Office Visit Note   Patient: Miranda Berger           Date of Birth: 1955/02/26           MRN: [id number] Visit Date: 12/01/2021              Requested by: Maurice Small, MD [facility] [place],  [place] [address] PCP: Maurice Small, MD   Assessment & Plan: Visit Diagnoses:  1. Hallux rigidus, right foot     Plan: Advised patient that I think to repeat the first MTP joint injection.  The patient sent dorsal MTP joint was prepped Betadine and intra-articular Marcaine/Depo-Medrol 1 :1/2 injection performed.  Tolerated well without complication.  After sitting for few minutes she reported excellent relief of her pain with anesthetic in place.  She will follow-up with Dr. Marlou Sa and Lurena Joiner as needed.  I did advise patient that with the dorsal spurs that she has at the MTP joint she would be a good candidate for cheilectomy procedure.  I reviewed x-rays with patient and her husband and gave them information regarding the procedure.  If they would like to entertain this in the future we could refer them to Dr. Wylene Simmer foot and ankle surgeon with [facility].  All questions answered.   Follow-Up Instructions: Return if symptoms worsen or fail to improve.   Orders:  Orders Placed This Encounter  Procedures   Small Joint Inj   No orders of the defined types were placed in this encounter.     Procedures: Small Joint Inj: R great MTP on 12/01/2021 3:24 PM Indications: pain Details: 25 G needle, dorsal approach Medications: 1 mL lidocaine 1 %; 1 mL bupivacaine 0.5 %; 20 mg methylPREDNISolone acetate 40 MG/ML Outcome: tolerated well, no immediate complications Consent was given by the patient. Patient was prepped and draped in the usual sterile fashion.       Clinical Data: No additional findings.   Subjective: Chief Complaint  Patient presents with   Right Foot - Pain    HPI 66 year old white female with known history of right great toe hallux rigidus  comes in with complaints of toe pain.  She has had injection performed [facility] here in the office with good temporary relief.  She had recent dental vacation and [place] which aggravated her toe pain.  Last injection was performed February 2023 and would like to have this repeated.   Review of Systems . No current complaints of cardiopulmonary GI/GU issues  Objective: Vital Signs: There were no vitals taken for this visit.  Physical Exam HENT:     Head: Normocephalic and atraumatic.  Musculoskeletal:     Comments: Gait is antalgic.  She is exquisitely tender over the right first MTP joint.  Palpable dorsal spurs and tenderness over this area as well.  Neurological:     Mental Status: She is alert and oriented to person, place, and time.  Psychiatric:        Mood and Affect: Mood normal.     Ortho Exam  Specialty Comments:  No specialty comments available.  Imaging: No results found.   PMFS History: Patient Active Problem List   Diagnosis Date Noted   Anemia 12/01/2021   Depression 12/01/2021   Family history of adverse reaction to anesthesia 12/01/2021   GERD (gastroesophageal reflux disease) 12/01/2021   Hepatitis 12/01/2021   PONV (postoperative nausea and vomiting) 12/01/2021   History of  hypertension 12/01/2021   Major depressive disorder, single episode, severe, with psychosis ([place]) 07/11/2018   MDD (major depressive disorder), recurrent severe, without psychosis ([place]) 07/10/2018   Costochondral chest pain 04/04/2018   Pericarditis 09/12/2017   LBBB (left bundle branch block) 08/08/2017   Obstructive sleep apnea    Anxiety    Asthma    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 07/26/2017   Hypertension 06/21/2017   Aortic stenosis 06/20/2017   Degenerative tear of medial meniscus of right knee 04/21/2016   High-tone pelvic floor dysfunction 12/26/2013   Difficult or painful urination  06/22/2012   Chronic interstitial cystitis 06/22/2012   Dyspareunia 06/22/2012   Female pelvic pain 06/22/2012   Urinary urgency 06/22/2012   Past Medical History:  Diagnosis Date   Anemia    in the past    Anxiety    Aortic stenosis 06/20/2017   Asthma    Chronic interstitial cystitis 06/22/2012   Costochondral chest pain 04/04/2018   Degenerative tear of medial meniscus of right knee 04/21/2016   Depression    Difficult or painful urination 06/22/2012   Dyspareunia 06/22/2012   Family history of adverse reaction to anesthesia    mom had nausea   Female pelvic pain 06/22/2012   GERD (gastroesophageal reflux disease)    Hepatitis    Hepatitis A in 3rd grade?   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 07/26/2017   [employer], size 23   Urinary urgency 06/22/2012    Family History  Problem Relation Age of Onset   Stroke Mother    Leukemia Mother    Stroke Father    Sleep apnea Father    Valvular heart disease Brother    Sleep apnea Brother    Heart Problems Maternal Aunt    Valvular heart disease Maternal Aunt    Valvular heart disease Paternal Grandfather     Past Surgical History:  Procedure Laterality Date   ABDOMINAL HYSTERECTOMY     AORTIC VALVE REPLACEMENT N/A 07/26/2017   Procedure: MINIMALLY INVASIVE AORTIC VALVE REPLACEMENT (AVR);  Surgeon: Rexene Alberts, MD;  Location: [place];  Service: Open Heart Surgery;  Laterality: N/A;   BLADDER SURGERY  2011   CARDIAC CATHETERIZATION     CARPAL TUNNEL RELEASE  2012    CHOLECYSTECTOMY     RIGHT/LEFT HEART CATH AND CORONARY ANGIOGRAPHY N/A 07/08/2017   Procedure: RIGHT/LEFT HEART CATH AND CORONARY ANGIOGRAPHY;  Surgeon: Sherren Mocha, MD;  Location: [place] [facility];  Service: Cardiovascular;  Laterality: N/A;   TEE WITHOUT CARDIOVERSION N/A 07/26/2017   Procedure: TRANSESOPHAGEAL ECHOCARDIOGRAM (TEE);  Surgeon: Rexene Alberts, MD;  Location: [place];  Service: Open Heart Surgery;  Laterality: N/A;   TONSILLECTOMY     Social History   Occupational History   Not on file  Tobacco Use   Smoking status: Never   Smokeless tobacco: Never  Vaping Use   Vaping Use: Never used  Substance and Sexual Activity   Alcohol use: No    Alcohol/week: 0.0 standard drinks of alcohol   Drug use: No  Sexual activity: Not on file

## 2021-12-02 ENCOUNTER — Encounter: Payer: Self-pay | Admitting: Cardiology

## 2021-12-02 ENCOUNTER — Ambulatory Visit: Payer: Medicare PPO | Attending: Cardiology | Admitting: Cardiology

## 2021-12-02 VITALS — BP 132/94 | HR 90 | Ht 63.0 in | Wt 168.0 lb

## 2021-12-02 DIAGNOSIS — Z953 Presence of xenogenic heart valve: Secondary | ICD-10-CM

## 2021-12-02 NOTE — Patient Instructions (Signed)
Happy belated Rudene Anda!!  Medication Instructions:  Your physician recommends that you continue on your current medications as directed. Please refer to the Current Medication list given to you today.  *If you need a refill on your cardiac medications before your next appointment, please call your pharmacy*   Lab Work: None ordered If you have labs (blood work) drawn today and your tests are completely normal, you will receive your results only by: [place] (if you have MyChart) OR A paper copy in the mail If you have any lab test that is abnormal or we need to change your treatment, we will call you to review the results.   Testing/Procedures: Your physician has requested that you have an echocardiogram. Echocardiography is a painless test that uses sound waves to create images of your heart. It provides your doctor with information about the size and shape of your heart and how well your heart's chambers and valves are working. This procedure takes approximately one hour. There are no restrictions for this procedure.    Follow-Up: At [facility], you and your health needs are our priority.  As part of our continuing mission to provide you with exceptional heart care, we have created designated Provider Care Teams.  These Care Teams include your primary Cardiologist (physician) and Advanced Practice Providers (APPs -  Physician Assistants and Nurse Practitioners) who all work together to provide you with the care you need, when you need it.  We recommend signing up for the patient portal called "MyChart".  Sign up information is provided on this After Visit Summary.  MyChart is used to connect with patients for Virtual Visits (Telemedicine).  Patients are able to view lab/test results, encounter notes, upcoming appointments, etc.  Non-urgent messages can be sent to your provider as well.   To learn more about what you can do with MyChart, go to [email or web address].    Your  next appointment:   12 month(s)  The format for your next appointment:   In Person  Provider:   Jyl Heinz, MD   Other Instructions NA

## 2021-12-02 NOTE — Progress Notes (Signed)
Cardiology Office Note:    Date:  12/02/2021   ID:  Miranda Berger, [place] [birth year], MRN [id number]  PCP:  Maurice Small, MD  Cardiologist:  Jenean Lindau, MD   Referring MD: Maurice Small, MD    ASSESSMENT:    1. 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  Importance of compliance with diet medication stressed and she vocalized understanding.  She was advised to be active and do aerobic exercise at least half an hour a day 5 days a week and she promises to do so. Bioprosthetic aortic valve most minimally invasive surgery: Stable at this time.  We will do an echocardiogram to assess this.  She is agreeable. Weight reduction, stress with diet and exercise and she promises to comply.Patient will be seen in follow-up appointment in 12 months or earlier if the patient has any concerns    Medication Adjustments/Labs and Tests Ordered: Current medicines are reviewed at length with the patient today.  Concerns regarding medicines are outlined above.  No orders of the defined types were placed in this encounter.  No orders of the defined types were placed in this encounter.    No chief complaint on file.    History of Present Illness:    Miranda Berger is a 66 y.o. female.  Patient has been seen by Dr. Bettina Gavia who has moved to [facility].  Patient has prostatic aortic valve with minimal surgery in the past.  This was done in 2019.  She denies any chest pain orthopnea or PND.  She takes care of activities of daily living.  Currently she is sedentary because of orthopedic issues involving her foot.  At the time of my evaluation, the patient is alert awake oriented and in no distress.  Past Medical History:  Diagnosis Date   Anemia    in the past    Anxiety    Aortic stenosis 06/20/2017   Asthma    Chronic interstitial cystitis 06/22/2012   Costochondral chest  pain 04/04/2018   Degenerative tear of medial meniscus of right knee 04/21/2016   Depression    Difficult or painful urination 06/22/2012   Dyspareunia 06/22/2012   Family history of adverse reaction to anesthesia    mom had nausea   Female pelvic pain 06/22/2012   GERD (gastroesophageal reflux disease)    Hepatitis    Hepatitis A in 3rd grade?   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 07/26/2017   [employer], size 23   Urinary urgency 06/22/2012    Past Surgical History:  Procedure Laterality Date   ABDOMINAL HYSTERECTOMY     AORTIC VALVE REPLACEMENT N/A 07/26/2017   Procedure: MINIMALLY INVASIVE AORTIC VALVE REPLACEMENT (AVR);  Surgeon: Rexene Alberts, MD;  Location: [place];  Service: Open Heart Surgery;  Laterality: N/A;   BLADDER SURGERY  2011   CARDIAC CATHETERIZATION     CARPAL TUNNEL RELEASE  2012   CHOLECYSTECTOMY     RIGHT/LEFT HEART  CATH AND CORONARY ANGIOGRAPHY N/A 07/08/2017   Procedure: RIGHT/LEFT HEART CATH AND CORONARY ANGIOGRAPHY;  Surgeon: Sherren Mocha, MD;  Location: [place] [facility];  Service: Cardiovascular;  Laterality: N/A;   TEE WITHOUT CARDIOVERSION N/A 07/26/2017   Procedure: TRANSESOPHAGEAL ECHOCARDIOGRAM (TEE);  Surgeon: Rexene Alberts, MD;  Location: [place];  Service: Open Heart Surgery;  Laterality: N/A;   TONSILLECTOMY      Current Medications: Current Meds  Medication Sig   acebutolol (SECTRAL) 200 MG capsule Take 1 capsule (200 mg total) by mouth every other day.   albuterol (PROVENTIL HFA;VENTOLIN  HFA) 108 (90 Base) MCG/ACT inhaler Inhale 2 puffs into the lungs every 6 (six) hours as needed for wheezing or shortness of breath.   ALPRAZolam (XANAX) 0.5 MG tablet Take 0.25 mg by mouth in the morning and at bedtime.   aspirin EC 81 MG tablet Take 81 mg by mouth daily.   buPROPion (WELLBUTRIN XL) 300 MG 24 hr tablet Take 1 tablet (300 mg total) by mouth daily.   calcium carbonate (OS-CAL) 600 MG TABS tablet Take 600 mg by mouth daily.    fluticasone (FLONASE) 50 MCG/ACT nasal spray Place 1 spray into both nostrils daily as needed for allergies.    Fluticasone-Salmeterol (ADVAIR) 250-50 MCG/DOSE AEPB Inhale 1 puff into the lungs 2 (two) times daily.   Loratadine 10 MG CAPS Take 1 capsule by mouth daily as needed for allergies.   mirtazapine (REMERON) 15 MG tablet Take 7.5 mg by mouth at bedtime.    Multiple Vitamin (MULTIVITAMIN) capsule Take 1 capsule by mouth daily.     Allergies:   Codeine and Prednisone   Social History   Socioeconomic History   Marital status: Married    Spouse name: Not on file   Number of children: 1   Years of education: BS   Highest education level: Not on file  Occupational History   Not on file  Tobacco Use   Smoking status: Never   Smokeless tobacco: Never  Vaping Use   Vaping Use: Never used  Substance and Sexual Activity   Alcohol use: No    Alcohol/week: 0.0 standard drinks of alcohol   Drug use: No   Sexual activity: Not on file  Other Topics Concern   Not on file  Social History Narrative   Occasionally consumes tea or coffee   Social Determinants of Health   Financial Resource Strain: Not on file  Food Insecurity: No Food Insecurity (10/06/2017)   Hunger Vital Sign    Worried About Running Out of Food in the Last Year: Never true    Ran Out of Food in the Last Year: Never true  Transportation Needs: No Transportation Needs (10/06/2017)   PRAPARE - Hydrologist (Medical): No    Lack of Transportation  (Non-Medical): No  Physical Activity: Inactive (10/06/2017)   Exercise Vital Sign    Days of Exercise per Week: 0 days    Minutes of Exercise per Session: 0 min  Stress: Stress Concern Present (10/06/2017)   [place]    Feeling of Stress : Rather much  Social Connections: Not on file     Family History: The patient's family history includes Heart Problems in her maternal aunt; Leukemia in her mother; Sleep apnea in her brother and father; Stroke in her father and mother; Valvular heart disease in her brother, maternal aunt, and paternal grandfather.  ROS:   Please see  the history of present illness.    All other systems reviewed and are negative.  EKGs/Labs/Other Studies Reviewed:    The following studies were reviewed today:  EKG reveals sinus rhythm and nonspecific ST-T changes   Recent Labs: No results found for requested labs within last 365 days.  Recent Lipid Panel No results found for: "CHOL", "TRIG", "HDL", "CHOLHDL", "VLDL", "LDLCALC", "LDLDIRECT"  Physical Exam:    VS:  BP (!) 132/94   Pulse 90   Ht 5\' 3"  (1.6 m)   Wt 168 lb (76.2 kg)   SpO2 97%   BMI 29.76 kg/m     Wt Readings from Last 3 Encounters:  12/02/21 168 lb (76.2 kg)  10/15/21 171 lb (77.6 kg)  10/23/20 162 lb 6 oz (73.7 kg)     GEN: Patient is in no acute distress HEENT: Normal NECK: No JVD; No carotid bruits LYMPHATICS: No lymphadenopathy CARDIAC: Hear sounds regular, 2/6 systolic murmur at the apex. RESPIRATORY:  Clear to auscultation without rales, wheezing or rhonchi  ABDOMEN: Soft, non-tender, non-distended MUSCULOSKELETAL:  No edema; No deformity  SKIN: Warm and dry NEUROLOGIC:  Alert and oriented x 3 PSYCHIATRIC:  Normal affect   Signed, Jenean Lindau, MD  12/02/2021 1:51 PM    [facility]

## 2021-12-02 NOTE — Addendum Note (Signed)
Addended by: Truddie Hidden on: 12/02/2021 02:02 PM   Modules accepted: Orders

## 2021-12-09 ENCOUNTER — Encounter (INDEPENDENT_AMBULATORY_CARE_PROVIDER_SITE_OTHER): Payer: Medicare PPO | Admitting: Family Medicine

## 2021-12-11 ENCOUNTER — Ambulatory Visit (INDEPENDENT_AMBULATORY_CARE_PROVIDER_SITE_OTHER): Payer: Medicare PPO

## 2021-12-11 ENCOUNTER — Ambulatory Visit: Payer: Medicare PPO | Admitting: Surgical

## 2021-12-11 DIAGNOSIS — M25511 Pain in right shoulder: Secondary | ICD-10-CM | POA: Diagnosis not present

## 2021-12-11 DIAGNOSIS — G8929 Other chronic pain: Secondary | ICD-10-CM

## 2021-12-12 ENCOUNTER — Encounter: Payer: Self-pay | Admitting: Surgical

## 2021-12-12 NOTE — Progress Notes (Signed)
Office Visit Note   Patient: Miranda Berger           Date of Birth: Mar 30, 1955           MRN: [id number] Visit Date: 12/11/2021 Requested by: Maurice Small, MD [address],  [place] [address] PCP: Maurice Small, MD  Subjective: Chief Complaint  Patient presents with   Left Foot - Pain    HPI: Miranda Berger is a 66 y.o. female who presents to the office reporting right shoulder and right foot pain.  She states that she has a history of years of pain in the right shoulder.  She has had a flareup of the chronic right shoulder pain in the last several months that she mostly localizes to the posterior aspect of the right shoulder with radiation down the entire arm.  She has occasional numbness or tingling that is more more often.  She also notes neck pain and trapezius pain.  Has scapular pain that she notices about once per month.  She wakes with pain at night.  She feels weak in the right shoulder due to the severity of her pain.  She has radiation of pain down primarily into the right index finger.  She had a similar episode of right shoulder pain in 2022 that was determined to be due to cervical radiculopathy and she was referred to Dr. Saintclair Halsted and had C-spine surgery at that significantly helped her shoulder pain.  However this current episode of right shoulder pain "feels different".  Pain is worse with external rotation.  She is right-hand dominant.  Bothers her at night and she cannot really lay on the right arm.  Bothers her with lifting her arm up in an abducted position as well.  She also complains of right foot pain localizing this to the great toe.  She has history of hallux rigidus with ultrasound-guided injection into the first MTP joint back in February providing about 3 months of relief.  She had injection with Benjiman Core, PA-C on 10/10 that provided about 50% relief of her pain but she feels this is wearing off and she is leaning toward surgical  intervention due to the severity of her symptoms..                ROS: All systems reviewed are negative as they relate to the chief complaint within the history of present illness.  Patient denies fevers or chills.  Assessment & Plan: Visit Diagnoses:  1. Chronic right shoulder pain   2. Right shoulder pain, unspecified chronicity     Plan: Patient is a 66 year old female who presents for evaluation of right shoulder pain and right foot pain.  Radiographs of the right shoulder demonstrate no acute abnormality or any significant degenerative changes that are discernible.  She has positive impingement signs on exam today which may reflect subacromial bursitis but she has history of cervical radiculopathy and a lot of her complaints such as the posterior aspect of her pain and the radiation down the entirety of the arm as well as the reproduction with cervical spine range of motion suggest that this is more related to her neck than her shoulder.  However, plan to proceed with subacromial injection today as a diagnostic and hopefully therapeutic intervention.  She tolerated the procedure well.  She did have improvement of pain with range of motion and impingement positions about 5 minutes after injection.  She will give this 2 weeks to take effect  and if no improvement at that time, return to see Dr. Marlou Sa for further evaluation.  Regarding her hallux rigidus of the right foot, she is interested in cheilectomy procedure which, given the extent of her arthritis with severe joint space narrowing and osteophyte formation on either end of the joint, I doubt would provide predictable long-term relief of her right toe pain.  She may be looking more at arthrodesis or could consider arthroplasty if she would like to retain some range of motion at this joint.   Follow-Up Instructions: No follow-ups on file.   Orders:  Orders Placed This Encounter  Procedures   XR Shoulder Right   No orders of the defined  types were placed in this encounter.     Procedures: No procedures performed   Clinical Data: No additional findings.  Objective: Vital Signs: There were no vitals taken for this visit.  Physical Exam:  Constitutional: Patient appears well-developed HEENT:  Head: Normocephalic Eyes:EOM are normal Neck: Normal range of motion Cardiovascular: Normal rate Pulmonary/chest: Effort normal Neurologic: Patient is alert Skin: Skin is warm Psychiatric: Patient has normal mood and affect  Ortho Exam: Ortho exam demonstrates right shoulder with 50 degrees X rotation, 100 degrees abduction, 175 degrees forward flexion.  This compared to the left shoulder with 40 degrees external rotation, 100 Breze abduction, 160 degrees forward flexion.  She has excellent rotator cuff strength of supra, infra, subscap.  Positive Neer and Hawkins impingement signs.  She has no tenderness over the [facility] joint.  Minimal tenderness over the bicipital groove.  No significant tenderness over the axial cervical spine.  Negative Spurling sign.  She does have reproduction of her shoulder pain with rotation of the cervical spine.  5/5 motor strength of bilateral grip strength, finger abduction, pronation/supination, bicep, tricep, deltoid.  Axillary nerve is intact with deltoid firing.  Negative external Tatian lag sign.  Negative Hornblower sign.  Ortho exam of the right foot demonstrates palpable pedal pulse of the right lower extremity.  She has tenderness primarily around the first MTP joint of the great toe.  She has significantly reduced dorsiflexion range of motion of the right great toe versus the left great toe.  She has pain with mid range of motion of the first MTP joint as well as at the end range of motion.  There is no cellulitis or skin changes noted.  No swelling.  Specialty Comments:  No specialty comments available.  Imaging: No results found.   PMFS History: Patient Active Problem List   Diagnosis  Date Noted   Anemia 12/01/2021   Depression 12/01/2021   Family history of adverse reaction to anesthesia 12/01/2021   GERD (gastroesophageal reflux disease) 12/01/2021   Hepatitis 12/01/2021   PONV (postoperative nausea and vomiting) 12/01/2021   History of hypertension 12/01/2021   Major depressive disorder, single episode, severe, with psychosis ([place]) 07/11/2018   MDD (major depressive disorder), recurrent severe, without psychosis ([place]) 07/10/2018   Costochondral chest pain 04/04/2018   Pericarditis 09/12/2017   LBBB (left bundle branch block) 08/08/2017   Obstructive sleep apnea    Anxiety    Asthma    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 07/26/2017   Hypertension 06/21/2017   Aortic stenosis 06/20/2017   Degenerative tear of medial meniscus of right knee 04/21/2016   High-tone pelvic floor dysfunction 12/26/2013   Difficult or painful urination 06/22/2012   Chronic interstitial cystitis 06/22/2012   Dyspareunia 06/22/2012   Female pelvic pain 06/22/2012  Urinary urgency 06/22/2012   Past Medical History:  Diagnosis Date   Anemia    in the past    Anxiety    Aortic stenosis 06/20/2017   Asthma    Chronic interstitial cystitis 06/22/2012   Costochondral chest pain 04/04/2018   Degenerative tear of medial meniscus of right knee 04/21/2016   Depression    Difficult or painful urination 06/22/2012   Dyspareunia 06/22/2012   Family history of adverse reaction to anesthesia    mom had nausea   Female pelvic pain 06/22/2012   GERD (gastroesophageal reflux disease)    Hepatitis    Hepatitis A in 3rd grade?   High-tone pelvic floor dysfunction 12/26/2013   History of hypertension 12/01/2021   Hypertension    LBBB (left bundle branch block) 08/08/2017   Major depressive disorder, single episode, severe, with psychosis ([place]) 07/11/2018   MDD (major depressive disorder), recurrent severe, without psychosis ([place]) 07/10/2018   Obstructive sleep apnea     Pericarditis 09/12/2017   PONV (postoperative nausea and vomiting)    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 07/26/2017   [employer], size 23   Urinary urgency 06/22/2012    Family History  Problem Relation Age of Onset   Stroke Mother    Leukemia Mother    Stroke Father    Sleep apnea Father    Valvular heart disease Brother    Sleep apnea Brother    Heart Problems Maternal Aunt    Valvular heart disease Maternal Aunt    Valvular heart disease Paternal Grandfather     Past Surgical History:  Procedure Laterality Date   ABDOMINAL HYSTERECTOMY     AORTIC VALVE REPLACEMENT N/A 07/26/2017   Procedure: MINIMALLY INVASIVE AORTIC VALVE REPLACEMENT (AVR);  Surgeon: Rexene Alberts, MD;  Location: [facility];  Service: Open Heart Surgery;  Laterality: N/A;   BLADDER SURGERY  2011   CARDIAC CATHETERIZATION     CARPAL TUNNEL RELEASE  2012   CHOLECYSTECTOMY     RIGHT/LEFT HEART CATH AND CORONARY ANGIOGRAPHY N/A 07/08/2017   Procedure: RIGHT/LEFT HEART CATH AND CORONARY ANGIOGRAPHY;  Surgeon: Sherren Mocha, MD;  Location: [facility];  Service: Cardiovascular;  Laterality: N/A;   TEE WITHOUT CARDIOVERSION N/A 07/26/2017   Procedure: TRANSESOPHAGEAL ECHOCARDIOGRAM (TEE);  Surgeon: Rexene Alberts, MD;  Location: [facility];  Service: Open Heart Surgery;  Laterality: N/A;   TONSILLECTOMY     Social History   Occupational History   Not on file  Tobacco Use   Smoking status: Never   Smokeless tobacco: Never  Vaping Use   Vaping Use: Never used  Substance and Sexual Activity   Alcohol use: No    Alcohol/week: 0.0 standard drinks of alcohol   Drug use: No   Sexual activity: Not on file

## 2021-12-15 ENCOUNTER — Ambulatory Visit (HOSPITAL_BASED_OUTPATIENT_CLINIC_OR_DEPARTMENT_OTHER)
Admission: RE | Admit: 2021-12-15 | Discharge: 2021-12-15 | Disposition: A | Discharge status: Home / Self Care | Payer: Medicare PPO | Source: Ambulatory Visit | Attending: Cardiology | Admitting: Cardiology

## 2021-12-15 DIAGNOSIS — Z953 Presence of xenogenic heart valve: Secondary | ICD-10-CM | POA: Insufficient documentation

## 2021-12-16 ENCOUNTER — Other Ambulatory Visit: Payer: Self-pay | Admitting: Cardiology

## 2021-12-16 DIAGNOSIS — Z952 Presence of prosthetic heart valve: Secondary | ICD-10-CM

## 2021-12-16 LAB — ECHOCARDIOGRAM COMPLETE
AR max vel: 1.27 cm2
AV Area VTI: 1.35 cm2
AV Area mean vel: 1.28 cm2
AV Mean grad: 7 mmHg
AV Peak grad: 14.3 mmHg
Ao pk vel: 1.89 m/s
Area-P 1/2: 3.39 cm2
Calc EF: 50.6 %
MV M vel: 4.3 m/s
MV Peak grad: 73.8 mmHg
S' Lateral: 2.7 cm
Single Plane A2C EF: 56.5 %
Single Plane A4C EF: 42.6 %

## 2021-12-17 ENCOUNTER — Ambulatory Visit (INDEPENDENT_AMBULATORY_CARE_PROVIDER_SITE_OTHER): Payer: Medicare PPO

## 2021-12-17 ENCOUNTER — Encounter: Payer: Self-pay | Admitting: Cardiology

## 2021-12-17 ENCOUNTER — Ambulatory Visit: Payer: Medicare PPO | Attending: Cardiology | Admitting: Cardiology

## 2021-12-17 VITALS — BP 150/93 | HR 82 | Ht 63.0 in | Wt 169.0 lb

## 2021-12-17 DIAGNOSIS — Z953 Presence of xenogenic heart valve: Secondary | ICD-10-CM

## 2021-12-17 DIAGNOSIS — I10 Essential (primary) hypertension: Secondary | ICD-10-CM | POA: Diagnosis not present

## 2021-12-17 DIAGNOSIS — Z952 Presence of prosthetic heart valve: Secondary | ICD-10-CM

## 2021-12-17 DIAGNOSIS — I447 Left bundle-branch block, unspecified: Secondary | ICD-10-CM

## 2021-12-17 LAB — ECHOCARDIOGRAM LIMITED
AV Mean grad: 9 mmHg
AV Peak grad: 15.5 mmHg
Ao pk vel: 1.97 m/s
Area-P 1/2: 5.54 cm2

## 2021-12-17 NOTE — Progress Notes (Signed)
Cardiology Office Note:    Date:  12/17/2021   ID:  Miranda Berger, [place] Feb 10, 1956, MRN [id number]  PCP:  Maurice Small, MD  Cardiologist:  Shirlee More, MD    Referring MD: Maurice Small, MD    ASSESSMENT:    1. 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 LBBB (left bundle branch block)   3. Primary hypertension    PLAN:    In order of problems listed above:  From a cardiology perspective doing well normal AVR function and the mitral valve today is well visualized and is normal with physiologic regurgitation Not present on recent EKG obviously rate related I think as opposed to a change in treatment 2 to 3 weeks of good blood pressure determinations and make a decision especially in view of her previous symptomatic hypotension Can continue her cardiac medications including a low-dose beta-blocker and aspirin.   Next appointment: She will see me 1 year   Medication Adjustments/Labs and Tests Ordered: Current medicines are reviewed at length with the patient today.  Concerns regarding medicines are outlined above.  No orders of the defined types were placed in this encounter.  No orders of the defined types were placed in this encounter.  Chief complaint I had additional echo images I like to be seen and have them explain to me   History of Present Illness:    Miranda Berger is a 66 y.o. female with a hx of aortic stenosis with tissue AVR 07/26/2017 hypertension sleep apnea sinus tachycardia left bundle branch block and previous symptomatic hypotension last seen by me 11/05/2019. This week she had an echocardiogram performed for valve surveillance aortic valve was normal unfortunately mitral valve was overlapped with the annulus the prosthesis I had her come back today the texted additional images her mitral valve is structurally normal there is mild mitral regurgitation seen normal left ventricular function.  I will place an addendum to  her echo report.  She had a previous coronary artery calcium score of 0. Compliance with diet, lifestyle and medications: Yes  She was very upset by the call to come back for additional echo images all is reassuring. Has had COVID twice and tells me she has chronic fatigue Her blood pressure was recently elevated that caused great anxiety.  We discussed good technique validated device and read on her husband check blood pressure daily for the next 2 to 3 weeks sitting and standing and send a copy through [facility] Cardiology perspective doing well not having angina edema shortness of breath or syncope. She describes disequilibrium or vertigo. Past Medical History:  Diagnosis Date   Anemia    in the past    Anxiety    Aortic stenosis 06/20/2017   Asthma    Chronic interstitial cystitis 06/22/2012   Costochondral chest pain 04/04/2018   Degenerative tear of medial meniscus of right knee 04/21/2016   Depression    Difficult or painful urination 06/22/2012   Dyspareunia 06/22/2012   Family history of adverse reaction to anesthesia    mom had nausea   Female pelvic pain 06/22/2012   GERD (gastroesophageal reflux disease)    Hepatitis    Hepatitis A in 3rd grade?   High-tone pelvic floor dysfunction 12/26/2013   History of hypertension 12/01/2021   Hypertension    LBBB (left bundle branch block) 08/08/2017   Major depressive disorder, single episode, severe, with psychosis ([place]) 07/11/2018   MDD (major depressive disorder), recurrent severe, without psychosis ([place])  07/10/2018   Obstructive sleep apnea    Pericarditis 09/12/2017   PONV (postoperative nausea and vomiting)    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 07/26/2017   [employer], size 23   Urinary urgency 06/22/2012    Past Surgical History:  Procedure Laterality Date   ABDOMINAL HYSTERECTOMY     AORTIC VALVE REPLACEMENT N/A 07/26/2017   Procedure: MINIMALLY INVASIVE AORTIC VALVE REPLACEMENT (AVR);   Surgeon: Rexene Alberts, MD;  Location: [facility];  Service: Open Heart Surgery;  Laterality: N/A;   BLADDER SURGERY  2011   CARDIAC CATHETERIZATION     CARPAL TUNNEL RELEASE  2012   CHOLECYSTECTOMY     RIGHT/LEFT HEART CATH AND CORONARY ANGIOGRAPHY N/A 07/08/2017   Procedure: RIGHT/LEFT HEART CATH AND CORONARY ANGIOGRAPHY;  Surgeon: Sherren Mocha, MD;  Location: [facility];  Service: Cardiovascular;  Laterality: N/A;   TEE WITHOUT CARDIOVERSION N/A 07/26/2017   Procedure: TRANSESOPHAGEAL ECHOCARDIOGRAM (TEE);  Surgeon: Rexene Alberts, MD;  Location: [facility];  Service: Open Heart Surgery;  Laterality: N/A;   TONSILLECTOMY      Current Medications: Current Meds  Medication Sig   acebutolol (SECTRAL) 200 MG capsule Take 1 capsule (200 mg total) by mouth every other day.   albuterol (PROVENTIL HFA;VENTOLIN HFA) 108 (90 Base) MCG/ACT inhaler Inhale 2 puffs into the lungs every 6 (six) hours as needed for wheezing or shortness of breath.   ALPRAZolam (XANAX) 0.5 MG tablet Take 0.25 mg by mouth in the morning and at bedtime.   aspirin EC 81 MG tablet Take 81 mg by mouth daily.   buPROPion (WELLBUTRIN XL) 300 MG 24 hr tablet Take 1 tablet (300 mg total) by mouth daily.   calcium carbonate (OS-CAL) 600 MG TABS tablet Take 600 mg by mouth daily.    fluticasone (FLONASE) 50 MCG/ACT nasal spray Place 1 spray into both nostrils daily as needed for allergies.    Fluticasone-Salmeterol (ADVAIR) 250-50 MCG/DOSE AEPB Inhale 1 puff into the lungs 2 (two) times daily.   Loratadine 10 MG CAPS Take 1 capsule by mouth daily as needed for allergies.   mirtazapine (REMERON) 15 MG tablet Take 7.5 mg by mouth at bedtime.    Multiple Vitamin (MULTIVITAMIN) capsule Take 1 capsule by mouth daily.     Allergies:   Codeine and Prednisone   Social History   Socioeconomic History   Marital status: Married    Spouse name: Not on file   Number of children: 1   Years of education: BS   Highest education  level: Not on file  Occupational History   Not on file  Tobacco Use   Smoking status: Never   Smokeless tobacco: Never  Vaping Use   Vaping Use: Never used  Substance and Sexual Activity   Alcohol use: No    Alcohol/week: 0.0 standard drinks of alcohol   Drug use: No   Sexual activity: Not on file  Other Topics Concern   Not on file  Social History Narrative   Occasionally consumes tea or coffee   Social Determinants of Health   Financial Resource Strain: Not on file  Food Insecurity: No Food Insecurity (10/06/2017)   Hunger Vital Sign    Worried About Running Out of Food in the Last Year: Never true    Ran Out of Food in the Last Year: Never true  Transportation Needs: No Transportation Needs (10/06/2017)   PRAPARE - Hydrologist (Medical): No  Lack of Transportation (Non-Medical): No  Physical Activity: Inactive (10/06/2017)   Exercise Vital Sign    Days of Exercise per Week: 0 days    Minutes of Exercise per Session: 0 min  Stress: Stress Concern Present (10/06/2017)   [place]    Feeling of Stress : Rather much  Social Connections: Not on file     Family History: The patient's family history includes Heart Problems in her maternal aunt; Leukemia in her mother; Sleep apnea in her brother and father; Stroke in her father and mother; Valvular heart disease in her brother, maternal aunt, and paternal grandfather. ROS:   Please see the history of present illness.    All other systems reviewed and are negative.  EKGs/Labs/Other Studies Reviewed:    The following studies were reviewed today:  Recent EKG 12/02/2021 showed sinus rhythm and was normal Most recent labs 07/31/2021 A1c 5.8 hemoglobin 12.5 creatinine 1.04 potassium 4.6 TSH normal 3.03   Physical Exam:    VS:  BP (!) 150/93 (BP Location: Right Arm, Patient Position: Sitting, Cuff Size: Normal)   Pulse 82   Ht 5'  3" (1.6 m)   Wt 169 lb (76.7 kg)   SpO2 98%   BMI 29.94 kg/m     Wt Readings from Last 3 Encounters:  12/17/21 169 lb (76.7 kg)  12/02/21 168 lb (76.2 kg)  10/15/21 171 lb (77.6 kg)     GEN:  Well nourished, well developed in no acute distress HEENT: Normal NECK: No JVD; No carotid bruits LYMPHATICS: No lymphadenopathy CARDIAC: RRR, no murmurs, rubs, gallops RESPIRATORY:  Clear to auscultation without rales, wheezing or rhonchi  ABDOMEN: Soft, non-tender, non-distended MUSCULOSKELETAL:  No edema; No deformity  SKIN: Warm and dry NEUROLOGIC:  Alert and oriented x 3 PSYCHIATRIC:  Normal affect    Signed, Shirlee More, MD  12/17/2021 1:35 PM    [facility]

## 2021-12-17 NOTE — Patient Instructions (Signed)
Check your BP sitting and standing for a few weeks. Send log via MyChart.  Medication Instructions:  Your physician recommends that you continue on your current medications as directed. Please refer to the Current Medication list given to you today.  *If you need a refill on your cardiac medications before your next appointment, please call your pharmacy*   Lab Work: None ordered If you have labs (blood work) drawn today and your tests are completely normal, you will receive your results only by: [place] (if you have MyChart) OR A paper copy in the mail If you have any lab test that is abnormal or we need to change your treatment, we will call you to review the results.   Testing/Procedures: None ordered   Follow-Up: At [facility], you and your health needs are our priority.  As part of our continuing mission to provide you with exceptional heart care, we have created designated Provider Care Teams.  These Care Teams include your primary Cardiologist (physician) and Advanced Practice Providers (APPs -  Physician Assistants and Nurse Practitioners) who all work together to provide you with the care you need, when you need it.  We recommend signing up for the patient portal called "MyChart".  Sign up information is provided on this After Visit Summary.  MyChart is used to connect with patients for Virtual Visits (Telemedicine).  Patients are able to view lab/test results, encounter notes, upcoming appointments, etc.  Non-urgent messages can be sent to your provider as well.   To learn more about what you can do with MyChart, go to [email or web address].    Your next appointment:   6 month(s)  The format for your next appointment:   In Person  Provider:   Shirlee More, MD   Other Instructions NA

## 2022-01-05 ENCOUNTER — Ambulatory Visit: Payer: Medicare PPO | Admitting: Cardiology

## 2022-01-05 ENCOUNTER — Other Ambulatory Visit: Payer: Medicare PPO

## 2022-01-08 ENCOUNTER — Encounter: Payer: Self-pay | Admitting: Orthopedic Surgery

## 2022-01-08 ENCOUNTER — Ambulatory Visit: Payer: Medicare PPO | Admitting: Orthopedic Surgery

## 2022-01-08 ENCOUNTER — Other Ambulatory Visit: Payer: Self-pay

## 2022-01-08 DIAGNOSIS — M2021 Hallux rigidus, right foot: Secondary | ICD-10-CM

## 2022-01-08 DIAGNOSIS — G8929 Other chronic pain: Secondary | ICD-10-CM

## 2022-01-08 NOTE — Progress Notes (Signed)
Office Visit Note   Patient: Miranda Berger           Date of Birth: [birth year]           MRN: [id number] Visit Date: 01/08/2022 Requested by: Shirlean Mylar, MD [address] [place],  [place] [address] PCP: Shirlean Mylar, MD  Subjective: Chief Complaint  Patient presents with   Right Shoulder - Follow-up    HPI: Miranda Berger is a 66 y.o. female who presents to the office reporting right foot pain.  She has a known history of hallux rigidus.  Here to discuss treatment options.  Did have an injection done this week with minimal relief which was the third injection she has had.  Right foot hurts to walk.  Describes "deep bone pain".  She uses X strength Tylenol and Voltaren as treatment modalities.  She did get very good relief from right shoulder subacromial injection recently.                ROS: All systems reviewed are negative as they relate to the chief complaint within the history of present illness.  Patient denies fevers or chills.  Assessment & Plan: Visit Diagnoses:  1. Hallux rigidus, right foot     Plan: Impression is right foot hallux rigidus with some motion maintained.  I think she needs a better opinion about fusion versus MTP joint replacement.  Would like for her to see Dr. Victorino Dike for evaluation and management of this problem.  She is requesting an outside opinion.  She would like for me to consider doing surgery on this toe but I have assured her that there are other fellowship trained foot and ankle surgeons who can meet her requirements and give her an excellent second opinion about the pros and cons of fusion versus MTP joint replacement.  Follow-Up Instructions: No follow-ups on file.   Orders:  No orders of the defined types were placed in this encounter.  No orders of the defined types were placed in this encounter.     Procedures: No procedures performed   Clinical Data: No additional findings.  Objective: Vital Signs: There  were no vitals taken for this visit.  Physical Exam:  Constitutional: Patient appears well-developed HEENT:  Head: Normocephalic Eyes:EOM are normal Neck: Normal range of motion Cardiovascular: Normal rate Pulmonary/chest: Effort normal Neurologic: Patient is alert Skin: Skin is warm Psychiatric: Patient has normal mood and affect  Ortho Exam: Ortho exam demonstrates slightly antalgic gait to the right.  Pedal pulses palpable.  No bunion deformity present.  She does have about 10 degrees of MTP dorsiflexion and 15 degrees of plantarflexion with mild pain present to palpation at the MTP joint.  Reasonable arch.  No pain with pronation supination of the forefoot and she has good ankle tibiotalar subtalar range of motion as well.  Specialty Comments:  No specialty comments available.  Imaging: No results found.   PMFS History: Patient Active Problem List   Diagnosis Date Noted   Anemia 12/01/2021   Depression 12/01/2021   Family history of adverse reaction to anesthesia 12/01/2021   GERD (gastroesophageal reflux disease) 12/01/2021   Hepatitis 12/01/2021   PONV (postoperative nausea and vomiting) 12/01/2021   History of hypertension 12/01/2021   Major depressive disorder, single episode, severe, with psychosis (HCC) 07/11/2018   MDD (major depressive disorder), recurrent severe, 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   Costochondral chest pain 04/04/2018   Pericarditis 09/12/2017   LBBB (left bundle branch block)  08/08/2017   Obstructive sleep apnea    Anxiety    Asthma    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 07/26/2017   Hypertension 06/21/2017   Aortic stenosis 06/20/2017   Degenerative tear of medial meniscus of right knee 04/21/2016   High-tone pelvic floor dysfunction 12/26/2013   Difficult or painful urination 06/22/2012   Chronic interstitial cystitis 06/22/2012   Dyspareunia 06/22/2012   Female pelvic pain 06/22/2012   Urinary  urgency 06/22/2012   Past Medical History:  Diagnosis Date   Anemia    in the past    Anxiety    Aortic stenosis 06/20/2017   Asthma    Chronic interstitial cystitis 06/22/2012   Costochondral chest pain 04/04/2018   Degenerative tear of medial meniscus of right knee 04/21/2016   Depression    Difficult or painful urination 06/22/2012   Dyspareunia 06/22/2012   Family history of adverse reaction to anesthesia    mom had nausea   Female pelvic pain 06/22/2012   GERD (gastroesophageal reflux disease)    Hepatitis    Hepatitis A in 3rd grade?   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   Obstructive sleep apnea    Pericarditis 09/12/2017   PONV (postoperative nausea and vomiting)    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 07/26/2017   [employer], size 23   Urinary urgency 06/22/2012    Family History  Problem Relation Age of Onset   Stroke Mother    Leukemia Mother    Stroke Father    Sleep apnea Father    Valvular heart disease Brother    Sleep apnea Brother    Heart Problems Maternal Aunt    Valvular heart disease Maternal Aunt    Valvular heart disease Paternal Grandfather     Past Surgical History:  Procedure Laterality Date   ABDOMINAL HYSTERECTOMY     AORTIC VALVE REPLACEMENT N/A 07/26/2017   Procedure: MINIMALLY INVASIVE AORTIC VALVE REPLACEMENT (AVR);  Surgeon: Purcell Nails, MD;  Location: [facility] OR;  Service: Open Heart Surgery;  Laterality: N/A;   BLADDER SURGERY  2011   CARDIAC CATHETERIZATION     CARPAL TUNNEL RELEASE  2012   CHOLECYSTECTOMY     RIGHT/LEFT HEART CATH AND CORONARY ANGIOGRAPHY N/A 07/08/2017   Procedure: RIGHT/LEFT HEART CATH AND CORONARY  ANGIOGRAPHY;  Surgeon: Tonny Bollman, MD;  Location: [facility] INVASIVE CV LAB;  Service: Cardiovascular;  Laterality: N/A;   TEE WITHOUT CARDIOVERSION N/A 07/26/2017   Procedure: TRANSESOPHAGEAL ECHOCARDIOGRAM (TEE);  Surgeon: Purcell Nails, MD;  Location: [facility] OR;  Service: Open Heart Surgery;  Laterality: N/A;   TONSILLECTOMY     Social History   Occupational History   Not on file  Tobacco Use   Smoking status: Never   Smokeless tobacco: Never  Vaping Use   Vaping Use: Never used  Substance and Sexual Activity   Alcohol use: No    Alcohol/week: 0.0 standard drinks of alcohol   Drug use: No   Sexual activity: Not on file

## 2022-01-12 DIAGNOSIS — F331 Major depressive disorder, recurrent, moderate: Secondary | ICD-10-CM | POA: Diagnosis not present

## 2022-01-28 DIAGNOSIS — Z0000 Encounter for general adult medical examination without abnormal findings: Secondary | ICD-10-CM | POA: Diagnosis not present

## 2022-01-28 DIAGNOSIS — F411 Generalized anxiety disorder: Secondary | ICD-10-CM | POA: Diagnosis not present

## 2022-01-28 DIAGNOSIS — R7303 Prediabetes: Secondary | ICD-10-CM | POA: Diagnosis not present

## 2022-01-28 DIAGNOSIS — J45909 Unspecified asthma, uncomplicated: Secondary | ICD-10-CM | POA: Diagnosis not present

## 2022-01-28 DIAGNOSIS — Z23 Encounter for immunization: Secondary | ICD-10-CM | POA: Diagnosis not present

## 2022-01-28 DIAGNOSIS — I10 Essential (primary) hypertension: Secondary | ICD-10-CM | POA: Diagnosis not present

## 2022-01-28 DIAGNOSIS — R5382 Chronic fatigue, unspecified: Secondary | ICD-10-CM | POA: Diagnosis not present

## 2022-01-28 DIAGNOSIS — F430 Acute stress reaction: Secondary | ICD-10-CM | POA: Diagnosis not present

## 2022-01-28 DIAGNOSIS — Z952 Presence of prosthetic heart valve: Secondary | ICD-10-CM | POA: Diagnosis not present

## 2022-01-28 DIAGNOSIS — Z13220 Encounter for screening for lipoid disorders: Secondary | ICD-10-CM | POA: Diagnosis not present

## 2022-01-29 ENCOUNTER — Telehealth: Payer: Self-pay

## 2022-01-29 DIAGNOSIS — M2021 Hallux rigidus, right foot: Secondary | ICD-10-CM | POA: Diagnosis not present

## 2022-01-29 DIAGNOSIS — M79671 Pain in right foot: Secondary | ICD-10-CM | POA: Diagnosis not present

## 2022-01-29 NOTE — Telephone Encounter (Signed)
   Pre-operative Risk Assessment    Patient Name: Miranda Berger  DOB: December 20, 1955 MRN: [id number]      Request for Surgical Clearance    Procedure:   right hallux metatarsophalangeal joint arthrodesis   Date of Surgery:  Clearance TBD                                 Surgeon:  Dr. Toni Arthurs Surgeon's Group or Practice Name:  [facility] Phone number:  [phone] Fax number:  [phone] Attention Kenn File   Type of Clearance Requested:   - Medical    Type of Anesthesia:  General    Additional requests/questions:    SignedDione Housekeeper   01/29/2022, 3:27 PM

## 2022-01-29 NOTE — Telephone Encounter (Signed)
   Name: Miranda Berger  DOB: 24-Nov-1955  MRN: [id number]   Primary Cardiologist: Norman Herrlich, MD  Chart reviewed as part of pre-operative protocol coverage. Patient was contacted 01/29/2022 in reference to pre-operative risk assessment for pending surgery as outlined below.  Miranda Berger was last seen on 12/17/2021 by Dr. Dulce Sellar.  Since that day, Miranda Berger has done a cardiac standpoint.  Miranda Berger denies any new symptoms or concerns.  Miranda Berger is able to complete greater than 4 METS without difficulty.  Therefore, based on ACC/AHA guidelines, the patient would be at acceptable risk for the planned procedure without further cardiovascular testing.   The patient was advised that if Miranda Berger develops new symptoms prior to surgery to contact our office to arrange for a follow-up visit, and Miranda Berger verbalized understanding.  Regarding ASA therapy, we recommend continuation of ASA throughout the perioperative period.  However, if the surgeon feels that cessation of ASA is required in the perioperative period, it may be stopped 5-7 days prior to surgery with a plan to resume it as soon as felt to be feasible from a surgical standpoint in the post-operative period.  I will route this recommendation to the requesting party via Epic fax function and remove from pre-op pool. Please call with questions.  Joylene Grapes, NP 01/29/2022, 3:49 PM

## 2022-02-02 DIAGNOSIS — F331 Major depressive disorder, recurrent, moderate: Secondary | ICD-10-CM | POA: Diagnosis not present

## 2022-02-09 DIAGNOSIS — M2021 Hallux rigidus, right foot: Secondary | ICD-10-CM | POA: Diagnosis not present

## 2022-02-09 DIAGNOSIS — M25774 Osteophyte, right foot: Secondary | ICD-10-CM | POA: Diagnosis not present

## 2022-02-09 DIAGNOSIS — G8918 Other acute postprocedural pain: Secondary | ICD-10-CM | POA: Diagnosis not present

## 2022-03-29 DIAGNOSIS — M2021 Hallux rigidus, right foot: Secondary | ICD-10-CM | POA: Diagnosis not present

## 2022-03-29 DIAGNOSIS — Z5189 Encounter for other specified aftercare: Secondary | ICD-10-CM | POA: Diagnosis not present

## 2022-04-02 DIAGNOSIS — J45909 Unspecified asthma, uncomplicated: Secondary | ICD-10-CM | POA: Diagnosis not present

## 2022-04-02 DIAGNOSIS — J019 Acute sinusitis, unspecified: Secondary | ICD-10-CM | POA: Diagnosis not present

## 2022-04-06 DIAGNOSIS — F331 Major depressive disorder, recurrent, moderate: Secondary | ICD-10-CM | POA: Diagnosis not present

## 2022-04-26 DIAGNOSIS — J45909 Unspecified asthma, uncomplicated: Secondary | ICD-10-CM | POA: Diagnosis not present

## 2022-04-26 DIAGNOSIS — Z9989 Dependence on other enabling machines and devices: Secondary | ICD-10-CM | POA: Diagnosis not present

## 2022-04-26 DIAGNOSIS — I10 Essential (primary) hypertension: Secondary | ICD-10-CM | POA: Diagnosis not present

## 2022-04-26 DIAGNOSIS — R7303 Prediabetes: Secondary | ICD-10-CM | POA: Diagnosis not present

## 2022-04-26 DIAGNOSIS — F3341 Major depressive disorder, recurrent, in partial remission: Secondary | ICD-10-CM | POA: Diagnosis not present

## 2022-04-26 DIAGNOSIS — G4733 Obstructive sleep apnea (adult) (pediatric): Secondary | ICD-10-CM | POA: Diagnosis not present

## 2022-04-28 DIAGNOSIS — M2021 Hallux rigidus, right foot: Secondary | ICD-10-CM | POA: Diagnosis not present

## 2022-04-28 DIAGNOSIS — Z5189 Encounter for other specified aftercare: Secondary | ICD-10-CM | POA: Diagnosis not present

## 2022-05-10 DIAGNOSIS — F411 Generalized anxiety disorder: Secondary | ICD-10-CM | POA: Diagnosis not present

## 2022-06-07 DIAGNOSIS — F411 Generalized anxiety disorder: Secondary | ICD-10-CM | POA: Diagnosis not present

## 2022-06-16 DIAGNOSIS — J40 Bronchitis, not specified as acute or chronic: Secondary | ICD-10-CM | POA: Diagnosis not present

## 2022-06-16 DIAGNOSIS — J45909 Unspecified asthma, uncomplicated: Secondary | ICD-10-CM | POA: Diagnosis not present

## 2022-06-16 DIAGNOSIS — J329 Chronic sinusitis, unspecified: Secondary | ICD-10-CM | POA: Diagnosis not present

## 2022-06-21 DIAGNOSIS — F411 Generalized anxiety disorder: Secondary | ICD-10-CM | POA: Diagnosis not present

## 2022-06-24 DIAGNOSIS — J22 Unspecified acute lower respiratory infection: Secondary | ICD-10-CM | POA: Diagnosis not present

## 2022-07-06 DIAGNOSIS — F331 Major depressive disorder, recurrent, moderate: Secondary | ICD-10-CM | POA: Diagnosis not present

## 2022-07-13 DIAGNOSIS — H53143 Visual discomfort, bilateral: Secondary | ICD-10-CM | POA: Diagnosis not present

## 2022-07-13 DIAGNOSIS — H2513 Age-related nuclear cataract, bilateral: Secondary | ICD-10-CM | POA: Diagnosis not present

## 2022-08-05 DIAGNOSIS — H938X9 Other specified disorders of ear, unspecified ear: Secondary | ICD-10-CM | POA: Diagnosis not present

## 2022-08-05 DIAGNOSIS — H6121 Impacted cerumen, right ear: Secondary | ICD-10-CM | POA: Diagnosis not present

## 2022-08-11 DIAGNOSIS — D225 Melanocytic nevi of trunk: Secondary | ICD-10-CM | POA: Diagnosis not present

## 2022-08-11 DIAGNOSIS — L821 Other seborrheic keratosis: Secondary | ICD-10-CM | POA: Diagnosis not present

## 2022-08-11 DIAGNOSIS — L818 Other specified disorders of pigmentation: Secondary | ICD-10-CM | POA: Diagnosis not present

## 2022-08-21 DIAGNOSIS — H748X9 Other specified disorders of middle ear and mastoid, unspecified ear: Secondary | ICD-10-CM | POA: Diagnosis not present

## 2022-08-21 DIAGNOSIS — K121 Other forms of stomatitis: Secondary | ICD-10-CM | POA: Diagnosis not present

## 2022-08-23 HISTORY — PX: OTHER SURGICAL HISTORY: SHX169

## 2022-08-25 DIAGNOSIS — H9201 Otalgia, right ear: Secondary | ICD-10-CM | POA: Diagnosis not present

## 2022-08-25 DIAGNOSIS — H6593 Unspecified nonsuppurative otitis media, bilateral: Secondary | ICD-10-CM | POA: Diagnosis not present

## 2022-08-31 DIAGNOSIS — R7303 Prediabetes: Secondary | ICD-10-CM | POA: Diagnosis not present

## 2022-08-31 DIAGNOSIS — F411 Generalized anxiety disorder: Secondary | ICD-10-CM | POA: Diagnosis not present

## 2022-08-31 DIAGNOSIS — F3341 Major depressive disorder, recurrent, in partial remission: Secondary | ICD-10-CM | POA: Diagnosis not present

## 2022-08-31 DIAGNOSIS — R1012 Left upper quadrant pain: Secondary | ICD-10-CM | POA: Diagnosis not present

## 2022-09-02 DIAGNOSIS — F331 Major depressive disorder, recurrent, moderate: Secondary | ICD-10-CM | POA: Diagnosis not present

## 2022-09-09 ENCOUNTER — Institutional Professional Consult (permissible substitution) (INDEPENDENT_AMBULATORY_CARE_PROVIDER_SITE_OTHER): Payer: Medicare PPO | Admitting: Otolaryngology

## 2022-09-14 DIAGNOSIS — B351 Tinea unguium: Secondary | ICD-10-CM | POA: Diagnosis not present

## 2022-10-04 ENCOUNTER — Encounter (INDEPENDENT_AMBULATORY_CARE_PROVIDER_SITE_OTHER): Payer: Self-pay | Admitting: Otolaryngology

## 2022-10-04 ENCOUNTER — Ambulatory Visit (INDEPENDENT_AMBULATORY_CARE_PROVIDER_SITE_OTHER): Payer: Medicare PPO | Admitting: Otolaryngology

## 2022-10-04 VITALS — BP 160/93 | HR 80

## 2022-10-04 DIAGNOSIS — H6993 Unspecified Eustachian tube disorder, bilateral: Secondary | ICD-10-CM

## 2022-10-04 DIAGNOSIS — H9201 Otalgia, right ear: Secondary | ICD-10-CM | POA: Diagnosis not present

## 2022-10-04 DIAGNOSIS — R0981 Nasal congestion: Secondary | ICD-10-CM

## 2022-10-04 DIAGNOSIS — J3089 Other allergic rhinitis: Secondary | ICD-10-CM

## 2022-10-04 DIAGNOSIS — H9311 Tinnitus, right ear: Secondary | ICD-10-CM | POA: Diagnosis not present

## 2022-10-04 DIAGNOSIS — M26623 Arthralgia of bilateral temporomandibular joint: Secondary | ICD-10-CM | POA: Diagnosis not present

## 2022-10-04 NOTE — Progress Notes (Signed)
ENT CONSULT:  Reason for Consult: ear fullness and R otalgia    HPI: Miranda Berger is an 67 y.o. female with hx pre-diabetes, here for 2 months of right ear discomfort, and sensation of fluid in her ears. She was at the beach and started to have pain. She saw a physician, and was told to see ENT. No ear drops or medications given at the time. She still has mild discomfort in her right ear, she has tinnitus on the right side described as static noise. She denies having prior hearing evaluation. Reports hx of bruxism. No ear surgeries. She has environmental allergies and asthma. She is on Zyrtec. She has albuterol as needed. She uses Flonase daily. She was given Augmentin for a suspected ear infection in June based on records in epic.    Records Reviewed:  Cardiology Note 12/18/22  From a cardiology perspective doing well normal AVR function and the mitral valve today is well visualized and is normal with physiologic regurgitation Not present on recent EKG obviously rate related I think as opposed to a change in treatment 2 to 3 weeks of good blood pressure determinations and make a decision especially in view of her previous symptomatic hypotension Can continue her cardiac medications including a low-dose beta-blocker and aspirin.    Past Medical History:  Diagnosis Date   Anemia    in the past    Anxiety    Aortic stenosis 06/20/2017   Asthma    Chronic interstitial cystitis 06/22/2012   Costochondral chest pain 04/04/2018   Degenerative tear of medial meniscus of right knee 04/21/2016   Depression    Difficult or painful urination 06/22/2012   Dyspareunia 06/22/2012   Family history of adverse reaction to anesthesia    mom had nausea   Female pelvic pain 06/22/2012   GERD (gastroesophageal reflux disease)    Hepatitis    Hepatitis A in 3rd grade?   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   Obstructive sleep apnea    Pericarditis 09/12/2017   PONV (postoperative nausea and vomiting)    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 07/26/2017   [employer], size 23   Urinary urgency 06/22/2012    Past Surgical History:  Procedure Laterality Date   ABDOMINAL HYSTERECTOMY     AORTIC VALVE REPLACEMENT N/A 07/26/2017   Procedure: MINIMALLY INVASIVE AORTIC VALVE REPLACEMENT (AVR);  Surgeon: Purcell Nails, MD;  Location: [facility] OR;  Service: Open Heart Surgery;  Laterality: N/A;   BLADDER SURGERY  2011   CARDIAC CATHETERIZATION     CARPAL TUNNEL RELEASE  2012   CHOLECYSTECTOMY     RIGHT/LEFT HEART CATH AND CORONARY ANGIOGRAPHY N/A 07/08/2017   Procedure: RIGHT/LEFT HEART CATH AND CORONARY ANGIOGRAPHY;  Surgeon: Tonny Bollman, MD;  Location: [facility] INVASIVE CV LAB;  Service: Cardiovascular;  Laterality: N/A;   TEE WITHOUT CARDIOVERSION N/A 07/26/2017   Procedure: TRANSESOPHAGEAL ECHOCARDIOGRAM (TEE);  Surgeon: Purcell Nails, MD;  Location: [facility] OR;  Service: Open Heart Surgery;  Laterality: N/A;   TONSILLECTOMY      Family History  Problem Relation Age of Onset   Stroke Mother    Leukemia Mother    Stroke Father    Sleep apnea Father  Valvular heart disease Brother    Sleep apnea Brother    Heart Problems Maternal Aunt    Valvular heart disease Maternal Aunt    Valvular heart disease Paternal Grandfather     Social History:  reports that she has never smoked. She has never used smokeless tobacco. She reports that she does not drink alcohol and does not use drugs.  Allergies:  Allergies  Allergen Reactions   Codeine Itching   Prednisone Itching    Medications: I have reviewed the patient's current medications.  The PMH, PSH, Medications, Allergies, and  SH were reviewed and updated.  ROS: Constitutional: Negative for fever, weight loss and weight gain. Cardiovascular: Negative for chest pain and dyspnea on exertion. Respiratory: Is not experiencing shortness of breath at rest. Gastrointestinal: Negative for nausea and vomiting. Neurological: Negative for headaches. Psychiatric: The patient is not nervous/anxious  Blood pressure (!) 160/93, pulse 80, SpO2 99%.  PHYSICAL EXAM:  Exam: General: Well-developed, well-nourished Respiratory Respiratory effort: Equal inspiration and expiration without stridor Cardiovascular Peripheral Vascular: Warm extremities with equal color/perfusion Eyes: No nystagmus with equal extraocular motion bilaterally Neuro/Psych/Balance: Patient oriented to person, place, and time; Appropriate mood and affect; Gait is intact with no imbalance; Cranial nerves I-XII are intact Head and Face Inspection: Normocephalic and atraumatic without mass or lesion Facial Strength: Facial motility symmetric and full bilaterally ENT Pinna: External ear intact and fully developed External canal: Canal is patent with intact skin Tympanic Membrane: Clear and mobile External Nose: No scar or anatomic deformity Internal Nose: Septum is relatively straight on anterior rhinoscopy. No polyp, or purulence. Mucosal edema and erythema present.  Bilateral inferior turbinate hypertrophy.  Lips, Teeth, and gums: Mucosa and teeth intact and viable TMJ: Tender to palpation b/l with full mobility  Oral cavity/oropharynx: No erythema or exudate, no lesions present Neck Neck and Trachea: Midline trachea without mass or lesion   Procedure: none  Studies Reviewed: CT head 05/20/21 EXAM: CT HEAD WITHOUT CONTRAST   TECHNIQUE: Contiguous axial images were obtained from the base of the skull through the vertex without intravenous contrast.   RADIATION DOSE REDUCTION: This exam was performed according to the departmental  dose-optimization program which includes automated exposure control, adjustment of the mA and/or kV according to patient size and/or use of iterative reconstruction technique.   COMPARISON:  Head CT 07/10/2018.   FINDINGS: Brain: Cerebral volume appears to remain normal for age. No midline shift, ventriculomegaly, mass effect, evidence of mass lesion, intracranial hemorrhage or evidence of cortically based acute infarction. Gray-white matter differentiation is within normal limits throughout the brain. No encephalomalacia identified. Chronic partially empty sella.   Vascular: Calcified atherosclerosis at the skull base. No suspicious intracranial vascular hyperdensity. -+-   Skull: Negative.   Sinuses/Orbits: Visualized paranasal sinuses and mastoids are clear. Tympanic cavities are clear.   Other: Visualized orbits and scalp soft tissues are within normal limits.   IMPRESSION: Stable since 2020 and normal for age non contrast CT appearance of the brain  CT Cervical spine IMPRESSION: 1. Interval ACDF C4 through C7. No hardware loosening and developing interbody arthrodesis suspected, most pronounced at C4-C5.   2. Underlying chronic C2-C3 ankylosis. Relatively mild degeneration at the intervening C3-C4 level appears stable since the preoperative MRI. C7-T1 adjacent segment remains negative.  Assessment/Plan: Encounter Diagnoses  Name Primary?   Tinnitus of right ear Yes   Right ear pain    Bilateral temporomandibular joint pain    Environmental and seasonal allergies    Nasal congestion  Dysfunction of both eustachian tubes     67 year old female with history of prior ACDF who is here for 2 months of persistent right ear discomfort, which was initially described as pain and treated as an ear infection per report.  Symptoms started while she was at the beach.  She received a course of Augmentin.  She is also complaining of ringing in her right ear.  No dizziness or  vertigo.  No prior ear surgeries.  She reports history of allergic rhinitis and allergies, takes antihistamine and Flonase on as-needed basis.  On my exam her ear exam is unremarkable, with no evidence of ear infection or middle ear effusion.  She was tender at the TMJ bilaterally, without trismus or subluxation of the joint.  I discussed possible causes of ear pain, which include TMJ syndrome.  We also discussed eustachian tube dysfunction which she could have in the setting of recent ear infection, and nasal congestion and allergy symptoms.  I advised her to take Zyrtec and Flonase daily.  TMJ syndrome care was discussed.  We will obtain audiogram to evaluate her hearing and symptom of right-sided tinnitus.  She will return after testing.  -Audio and RTC after testing - TMJ care -Will consider referral to OMFS if symptoms persist in the future  Thank you for allowing me to participate in the care of this patient. Please do not hesitate to contact me with any questions or concerns.   Ashok Croon, MD Otolaryngology [facility] Phone: [phone] Fax: [phone]    10/04/2022, 5:40 PM

## 2022-10-04 NOTE — Patient Instructions (Signed)
-   Zyrtec and Flonase daily   TMJ (Temporomandibular Joint Syndrome) The temporomandibular (tem-puh-roe-man-DIB-u-lur) joint (TMJ) acts like a sliding hinge, connecting your jawbone to your skull. You have one joint on each side of your jaw. TMJ disorders -- a type of temporomandibular disorder or TMD -- can cause pain in your jaw joint and in the muscles that control jaw movement.  The exact cause of a person's TMJ disorder is often difficult to determine. Your pain may be due to a combination of factors, such as genetics, arthritis or jaw injury. Some people who have jaw pain also tend to clench or grind their teeth (bruxism), although many people habitually clench or grind their teeth and never develop TMJ disorders.  In most cases, the pain and discomfort associated with TMJ disorders is temporary and can be relieved with self-managed care or nonsurgical treatments. This includes stress reduction, softer diet when the pain is present, anti-inflammatory pain medications such as Motrin and warm compresses.

## 2022-10-14 ENCOUNTER — Encounter: Payer: Self-pay | Admitting: *Deleted

## 2022-10-14 DIAGNOSIS — F331 Major depressive disorder, recurrent, moderate: Secondary | ICD-10-CM | POA: Diagnosis not present

## 2022-10-17 NOTE — Progress Notes (Unsigned)
PATIENT: Miranda Berger DOB: 1955-11-16  REASON FOR VISIT: follow up HISTORY FROM: patient Primary neurologist: Dr. Frances Furbish  No chief complaint on file.    HISTORY OF PRESENT ILLNESS: Today 10/17/22:  Miranda Berger is a 67 y.o. female with a history of OSA on CPAP. Returns today for follow-up.     10/15/21:Miranda Berger is a 67 year old female with a history of obstructive sleep apnea on CPAP.  She returns today for follow-up.  Her download indicates that she used her machine 27 out of 30 days for compliance of 90%.  She used her machine greater than 4 hours for compliance of 66.7%.  On average she uses the machine 5 hours and 34 minutes.  Her residual AHI is 0.8 on 13 cm of water.  She reports that the CPAP does work well for her.  She states over the last month she had COVID and upper respiratory infection therefore she was unable to use it longer than 4 hours some nights.  She also states that she now has a diagnosis of chronic fatigue syndrome.  She returns today for an evaluation.   10/23/20: Miranda Berger is a 67 year old female with a history of obstructive sleep apnea on CPAP.  Her download indicates that she used her machine 28 out of 30 days for compliance of 93%.  She used her machine greater than 4 hours for compliance of 80%.  On average she uses her machine 5 hours and 37 minutes.  Her residual AHI is 1.3 on 13 cm of water.  She denies any new issues with her machine she returns today for an evaluation.   10/23/19: Miranda Berger is a 67 year old female with a history of obstructive sleep apnea on CPAP.  Her download indicates that she use her machine nightly for compliance of 100%.  She used her machine greater than 4 hours each night.  On average she uses her machine 7 hours and 13 minutes.  Her residual AHI is 0.9 on 13 cm of water with EPR of 2.  Reports that the CPAP works well for her.  HISTORY 10/23/18:   Miranda Berger is a 67 year old female with a history of obstructive sleep  apnea on CPAP.  Her download indicates that she use her machine 30 out of 30 days for compliance of 100%.  She used her machine greater than 4 hours each night.  On average she uses her machine 8 hours and 57 minutes.  Her residual AHI is 0.8 on 13 cmH2O. she reports that the CPAP continues to work well for her.  She states that she has been dealing with anxiety and depression over the last year.  She states that sometimes she finds it hard to use the mask due to claustrophobia.  Otherwise she is doing well.  She returns today for an evaluation.  REVIEW OF SYSTEMS: Out of a complete 14 system review of symptoms, the patient complains only of the following symptoms, and all other reviewed systems are negative.  FSS 46 ESS 19 due to chronic fatigue  ALLERGIES: Allergies  Allergen Reactions   Codeine Itching   Prednisone Itching    HOME MEDICATIONS: Outpatient Medications Prior to Visit  Medication Sig Dispense Refill   acebutolol (SECTRAL) 200 MG capsule Take 1 capsule (200 mg total) by mouth every other day. (Patient not taking: Reported on 10/04/2022) 90 capsule 2   albuterol (PROVENTIL HFA;VENTOLIN HFA) 108 (90 Base) MCG/ACT inhaler Inhale 2 puffs into the lungs every 6 (six)  hours as needed for wheezing or shortness of breath.     ALPRAZolam (XANAX) 0.5 MG tablet Take 0.25 mg by mouth in the morning and at bedtime.     aspirin EC 81 MG tablet Take 81 mg by mouth daily.     buPROPion (WELLBUTRIN XL) 300 MG 24 hr tablet Take 1 tablet (300 mg total) by mouth daily. 30 tablet 0   calcium carbonate (OS-CAL) 600 MG TABS tablet Take 600 mg by mouth daily.      cetirizine (ZYRTEC) 5 MG tablet Take 5 mg by mouth daily.     fluticasone (FLONASE) 50 MCG/ACT nasal spray Place 1 spray into both nostrils daily as needed for allergies.      Fluticasone-Salmeterol (ADVAIR) 250-50 MCG/DOSE AEPB Inhale 1 puff into the lungs 2 (two) times daily.     Loratadine 10 MG CAPS Take 1 capsule by mouth daily as  needed for allergies. (Patient not taking: Reported on 10/04/2022)     mirtazapine (REMERON) 15 MG tablet Take 7.5 mg by mouth at bedtime.      Multiple Vitamin (MULTIVITAMIN) capsule Take 1 capsule by mouth daily.     No facility-administered medications prior to visit.    PAST MEDICAL HISTORY: Past Medical History:  Diagnosis Date   Anemia    in the past    Anxiety    Aortic stenosis 06/20/2017   Asthma    Chronic interstitial cystitis 06/22/2012   Costochondral chest pain 04/04/2018   Degenerative tear of medial meniscus of right knee 04/21/2016   Depression    Difficult or painful urination 06/22/2012   Dyspareunia 06/22/2012   Family history of adverse reaction to anesthesia    mom had nausea   Female pelvic pain 06/22/2012   GERD (gastroesophageal reflux disease)    Hepatitis    Hepatitis A in 3rd grade?   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   Obstructive sleep apnea    Pericarditis 09/12/2017   PONV (postoperative nausea and vomiting)    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 07/26/2017   [employer], size 23   Urinary urgency 06/22/2012    PAST SURGICAL HISTORY: Past Surgical History:  Procedure Laterality Date   ABDOMINAL HYSTERECTOMY     AORTIC VALVE REPLACEMENT N/A 07/26/2017   Procedure: MINIMALLY INVASIVE AORTIC VALVE REPLACEMENT (AVR);  Surgeon: Purcell Nails, MD;  Location: [facility] OR;  Service: Open Heart Surgery;  Laterality: N/A;   BLADDER SURGERY  2011   CARDIAC CATHETERIZATION     CARPAL TUNNEL RELEASE  2012   CHOLECYSTECTOMY     RIGHT/LEFT HEART CATH AND CORONARY ANGIOGRAPHY N/A 07/08/2017   Procedure: RIGHT/LEFT HEART CATH AND CORONARY ANGIOGRAPHY;   Surgeon: Tonny Bollman, MD;  Location[facility] INVASIVE CV LAB;  Service: Cardiovascular;  Laterality: N/A;   TEE WITHOUT CARDIOVERSION N/A 07/26/2017   Procedure: TRANSESOPHAGEAL ECHOCARDIOGRAM (TEE);  Surgeon: Purcell Nails, MD;  Location: [facility] OR;  Service: Open Heart Surgery;  Laterality: N/A;   TONSILLECTOMY      FAMILY HISTORY: Family History  Problem Relation Age of Onset   Stroke Mother    Leukemia Mother    Stroke Father    Sleep apnea Father    Valvular heart disease Brother  Sleep apnea Brother    Heart Problems Maternal Aunt    Valvular heart disease Maternal Aunt    Valvular heart disease Paternal Grandfather     SOCIAL HISTORY: Social History   Socioeconomic History   Marital status: Married    Spouse name: Not on file   Number of children: 1   Years of education: BS   Highest education level: Not on file  Occupational History   Not on file  Tobacco Use   Smoking status: Never   Smokeless tobacco: Never  Vaping Use   Vaping status: Never Used  Substance and Sexual Activity   Alcohol use: No    Alcohol/week: 0.0 standard drinks of alcohol   Drug use: No   Sexual activity: Not on file  Other Topics Concern   Not on file  Social History Narrative   Occasionally consumes tea or coffee   Social Determinants of Health   Financial Resource Strain: Not on file  Food Insecurity: No Food Insecurity (10/06/2017)   Hunger Vital Sign    Worried About Running Out of Food in the Last Year: Never true    Ran Out of Food in the Last Year: Never true  Transportation Needs: No Transportation Needs (10/06/2017)   PRAPARE - Administrator, Civil Service (Medical): No    Lack of Transportation (Non-Medical): No  Physical Activity: Inactive (10/06/2017)   Exercise Vital Sign    Days of Exercise per Week: 0 days    Minutes of Exercise per Session: 0 min  Stress: Stress Concern Present (10/06/2017)   [employer] of Occupational Health - Occupational  Stress Questionnaire    Feeling of Stress : Rather much  Social Connections: Not on file  Intimate Partner Violence: Not on file      PHYSICAL EXAM  There were no vitals filed for this visit.    There is no height or weight on file to calculate BMI.  Generalized: Well developed, in no acute distress  Chest: Lungs clear to auscultation bilaterally  Neurological examination  Mentation: Alert oriented to time, place, history taking. Follows all commands speech and language fluent Cranial nerve II-XII: Facial Symmetry noted   DIAGNOSTIC DATA (LABS, IMAGING, TESTING) - I reviewed patient records, labs, notes, testing and imaging myself where available.  Lab Results  Component Value Date   WBC 6.6 01/28/2020   HGB 12.0 01/28/2020   HCT 36.3 01/28/2020   MCV 92.8 01/28/2020   PLT 211 01/28/2020      Component Value Date/Time   NA 140 01/28/2020 1243   NA 143 08/09/2017 0850   K 3.9 01/28/2020 1243   CL 104 01/28/2020 1243   CO2 28 01/28/2020 1243   GLUCOSE 99 01/28/2020 1243   BUN 13 01/28/2020 1243   BUN 14 08/09/2017 0850   CREATININE 0.91 01/28/2020 1243   CALCIUM 10.0 01/28/2020 1243   PROT 7.1 07/10/2018 1733   ALBUMIN 4.7 07/10/2018 1733   AST 17 07/10/2018 1733   ALT 19 07/10/2018 1733   ALKPHOS 53 07/10/2018 1733   BILITOT 0.6 07/10/2018 1733   GFRNONAA >60 01/28/2020 1243   GFRAA >60 07/10/2018 1733    Lab Results  Component Value Date   HGBA1C 5.5 07/22/2017    Lab Results  Component Value Date   TSH 1.834 07/11/2018      ASSESSMENT AND PLAN 67 y.o. year old female  has a past medical history of Anemia, Anxiety, Aortic stenosis (06/20/2017), Asthma, Chronic interstitial cystitis (06/22/2012),  Costochondral chest pain (04/04/2018), Degenerative tear of medial meniscus of right knee (04/21/2016), Depression, Difficult or painful urination (06/22/2012), Dyspareunia (06/22/2012), Family history of adverse reaction to anesthesia, Female pelvic pain  (06/22/2012), GERD (gastroesophageal reflux disease), Hepatitis, High-tone pelvic floor dysfunction (12/26/2013), History of hypertension (12/01/2021), Hypertension, LBBB (left bundle branch block) (08/08/2017), Major depressive disorder, single episode, severe, with psychosis (HCC) (07/11/2018), MDD (major depressive disorder), recurrent severe, without psychosis (HCC) (07/10/2018), Obstructive sleep apnea, Pericarditis (09/12/2017), PONV (postoperative nausea and vomiting), 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 (07/26/2017), and Urinary urgency (06/22/2012). here with:  OSA on CPAP  - CPAP compliance good encouraged the patient to try to use machine greater than 4 hours each night - Good treatment of AHI  - Encourage patient to use CPAP nightly and > 4 hours each night - F/U in 1 year or sooner if needed   Butch Penny, MSN, NP-C 10/17/2022, 2:22 PM [facility] [address], [address] [place], [place] [address] [phone]

## 2022-10-18 ENCOUNTER — Ambulatory Visit: Payer: Medicare PPO | Admitting: Adult Health

## 2022-10-18 ENCOUNTER — Encounter: Payer: Self-pay | Admitting: Adult Health

## 2022-10-18 VITALS — BP 126/80 | HR 89 | Ht 63.0 in | Wt 159.2 lb

## 2022-10-18 DIAGNOSIS — G4733 Obstructive sleep apnea (adult) (pediatric): Secondary | ICD-10-CM

## 2022-10-18 NOTE — Patient Instructions (Signed)
Your Plan:  Continue using CPAP Will reach out to DME about new machine If your symptoms worsen or you develop new symptoms please let us know.    Thank you for coming to see [place] at [facility]. I hope we have been able to provide you high quality care today.  You may receive a patient satisfaction survey over the next few weeks. We would appreciate your feedback and comments so that we may continue to improve ourselves and the health of our patients.

## 2022-10-21 ENCOUNTER — Telehealth: Payer: Self-pay | Admitting: *Deleted

## 2022-10-21 DIAGNOSIS — G4733 Obstructive sleep apnea (adult) (pediatric): Secondary | ICD-10-CM

## 2022-10-21 NOTE — Telephone Encounter (Signed)
I spoke with French Ana @ [facility].  She states the reason why they cannot get a download either is because the patient is inactive in their system.  The reason why is because she did not order supplies for at least a year.  In order to reactivate her, they just need a new order for supplies and then a new office visit.  They are wondering why the patient would need a new machine because she did get the replacement last year and that should be 5 years until insurance covers again.

## 2022-10-21 NOTE — Telephone Encounter (Signed)
-----   Message from [facility] sent at 10/18/2022  8:51 AM EDT ----- Please call DME and see is she would qualify for new machine. Reports that she got a replacement a year ago d/t recall

## 2022-10-26 DIAGNOSIS — Z1231 Encounter for screening mammogram for malignant neoplasm of breast: Secondary | ICD-10-CM | POA: Diagnosis not present

## 2022-10-26 DIAGNOSIS — Z6828 Body mass index (BMI) 28.0-28.9, adult: Secondary | ICD-10-CM | POA: Diagnosis not present

## 2022-10-26 DIAGNOSIS — Z01419 Encounter for gynecological examination (general) (routine) without abnormal findings: Secondary | ICD-10-CM | POA: Diagnosis not present

## 2022-10-26 NOTE — Telephone Encounter (Signed)
I tried to call [facility] but could not reach anyone and the expect wait time was 28 minutes so I sent a community message clarifying this. I also placed an order to reactivate patient and provide supplies as needed.

## 2022-10-26 NOTE — Addendum Note (Signed)
Addended by: Bertram Savin on: 10/26/2022 11:00 AM   Modules accepted: Orders

## 2022-10-26 NOTE — Telephone Encounter (Signed)
Only mention New machine because we couldn't get a DL. Can they reach out to her. Not sure why is is listed inactive.

## 2022-10-28 ENCOUNTER — Ambulatory Visit: Payer: Medicare PPO | Attending: Family Medicine | Admitting: Audiologist

## 2022-10-28 DIAGNOSIS — H9041 Sensorineural hearing loss, unilateral, right ear, with unrestricted hearing on the contralateral side: Secondary | ICD-10-CM | POA: Insufficient documentation

## 2022-10-28 NOTE — Telephone Encounter (Signed)
[facility] confirmed receipt of orders.

## 2022-10-29 ENCOUNTER — Ambulatory Visit: Payer: Medicare PPO | Admitting: Audiologist

## 2022-10-29 DIAGNOSIS — H9041 Sensorineural hearing loss, unilateral, right ear, with unrestricted hearing on the contralateral side: Secondary | ICD-10-CM

## 2022-10-29 NOTE — Procedures (Signed)
  Outpatient Audiology and [facility] [address] [place], [place]  [address] [phone]  AUDIOLOGICAL  EVALUATION  NAME: Miranda Berger     DOB:   [birth year]      MRN: [id number]                                                                                     DATE: 10/29/2022     REFERENT: Shirlean Mylar, MD STATUS: Outpatient DIAGNOSIS: Unilateral Right Ear Hearing Loss    History: Miranda Berger was seen for an audiological evaluation. Miranda Berger was referred by Otolaryngology. She had an episode of severe pain, static, and sensitivity in the right ear. She felt like it was full of water. This started around two months ago. She was given antibiotics by Urgent Care. She feels the hearing has returned some, and the static has stopped. She feels she hears well over all. She has already seen Otolaryngology and has a follow up for next week scheduled.   Evaluation:  Otoscopy showed a clear view of the tympanic membranes, bilaterally Tympanometry results were consistent with normal middle ear function, bilaterally   Audiometric testing was completed using conventional audiometry with insert and supraural transducer. Speech Recognition Thresholds were 35dB in the right ear and 10dB in the left ear. Word Recognition was performed 40dB SL, scored 100% in the right ear and 100% in the left ear. Pure tone thresholds show moderate notched sensorineural hearing loss at 500-1kHz in the right ear and normal hearing in the left ear. See below.   Results:  The test results were reviewed with Miranda Berger. 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 Left ear hearing is normal. Middle ear functio normal bilaterally. Copy of audiogram given to Miranda Berger.    Recommendations: Follow up with Dr. Irene Pap at previously scheduled appointment.  Monitor hearing. Schedule follow up once plan of care discussed with Otolaryngology    27 minutes spent testing and counseling on results.   Miranda Berger  Audiologist, Au.D., CCC-A 10/29/2022  11:52 AM  Cc: Shirlean Mylar, MD

## 2022-11-01 ENCOUNTER — Telehealth (INDEPENDENT_AMBULATORY_CARE_PROVIDER_SITE_OTHER): Payer: Self-pay | Admitting: Otolaryngology

## 2022-11-01 NOTE — Telephone Encounter (Signed)
Patient called and was very concerned after her Audiology appt. She currently is scheduled for a follow up on 11/08/22 and would like to come in sooner and discuss results if the schedule allow.  She can be reached at [phone]. Please advise.

## 2022-11-08 ENCOUNTER — Encounter (INDEPENDENT_AMBULATORY_CARE_PROVIDER_SITE_OTHER): Payer: Self-pay | Admitting: Otolaryngology

## 2022-11-08 ENCOUNTER — Ambulatory Visit (INDEPENDENT_AMBULATORY_CARE_PROVIDER_SITE_OTHER): Payer: Medicare PPO | Admitting: Otolaryngology

## 2022-11-08 ENCOUNTER — Telehealth (INDEPENDENT_AMBULATORY_CARE_PROVIDER_SITE_OTHER): Payer: Self-pay | Admitting: Otolaryngology

## 2022-11-08 VITALS — BP 131/82 | HR 94 | Ht 63.0 in | Wt 157.0 lb

## 2022-11-08 DIAGNOSIS — J3089 Other allergic rhinitis: Secondary | ICD-10-CM | POA: Diagnosis not present

## 2022-11-08 DIAGNOSIS — R42 Dizziness and giddiness: Secondary | ICD-10-CM | POA: Diagnosis not present

## 2022-11-08 DIAGNOSIS — H903 Sensorineural hearing loss, bilateral: Secondary | ICD-10-CM

## 2022-11-08 DIAGNOSIS — H9311 Tinnitus, right ear: Secondary | ICD-10-CM

## 2022-11-08 DIAGNOSIS — R0981 Nasal congestion: Secondary | ICD-10-CM

## 2022-11-08 DIAGNOSIS — H6993 Unspecified Eustachian tube disorder, bilateral: Secondary | ICD-10-CM

## 2022-11-08 DIAGNOSIS — M26623 Arthralgia of bilateral temporomandibular joint: Secondary | ICD-10-CM

## 2022-11-08 NOTE — Patient Instructions (Signed)
-   schedule MRI IAC (scan of your head) - continue Flonase and allergy pill  -return after imaging

## 2022-11-08 NOTE — Telephone Encounter (Signed)
Yes she is going to put that in

## 2022-11-08 NOTE — Telephone Encounter (Signed)
Ms. Miranda Berger stated that Dr. 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  Ms. Miranda Berger wanted to know if Dr. Irene Pap was going to put in a referral or if there is a Careers adviser she could recommend for her to see.  Please advise.

## 2022-11-08 NOTE — Progress Notes (Unsigned)
ENT Progress Note:  Update 11/09/22: She completed her hearing evaluation and it showed asymmetric hearing loss with unilateral low-frequency hearing loss on the right side.  Her ear discomfort is somewhat better.  She is using Flonase daily and takes Zyrtec.  She reports intermittent seconds like feeling of dizziness off-balance sensation.  Denies frank vertigo.  Symptoms typically occur when she stands up, resolves when she sits down and takes a break.  History of aortic valve replacement, being followed by cardiology last seen in October 2024.   Initial HPI 10/04/22:  Reason for Consult: ear fullness and R otalgia    HPI: Miranda Berger is an 67 y.o. female with hx pre-diabetes, here for 2 months of right ear discomfort, and sensation of fluid in her ears. She was at the beach and started to have pain. She saw a physician, and was told to see ENT. No ear drops or medications given at the time. She still has mild discomfort in her right ear, she has tinnitus on the right side described as static noise. She denies having prior hearing evaluation. Reports hx of bruxism. No ear surgeries. She has environmental allergies and asthma. She is on Zyrtec. She has albuterol as needed. She uses Flonase daily. She was given Augmentin for a suspected ear infection in June based on records in epic.    Records Reviewed:  Cardiology Note 12/18/22  From a cardiology perspective doing well normal AVR function and the mitral valve today is well visualized and is normal with physiologic regurgitation Not present on recent EKG obviously rate related I think as opposed to a change in treatment 2 to 3 weeks of good blood pressure determinations and make a decision especially in view of her previous symptomatic hypotension Can continue her cardiac medications including a low-dose beta-blocker and aspirin.    Past Medical History:  Diagnosis Date   Anemia    in the past    Anxiety    Aortic stenosis 06/20/2017    Asthma    Chronic interstitial cystitis 06/22/2012   Costochondral chest pain 04/04/2018   Degenerative tear of medial meniscus of right knee 04/21/2016   Depression    Difficult or painful urination 06/22/2012   Dyspareunia 06/22/2012   Family history of adverse reaction to anesthesia    mom had nausea   Female pelvic pain 06/22/2012   GERD (gastroesophageal reflux disease)    Hepatitis    Hepatitis A in 3rd grade?   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   Obstructive sleep apnea    Pericarditis 09/12/2017   PONV (postoperative nausea and vomiting)    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 07/26/2017   [employer], size 23   Urinary urgency 06/22/2012    Past Surgical History:  Procedure Laterality Date   ABDOMINAL HYSTERECTOMY     AORTIC VALVE REPLACEMENT N/A 07/26/2017   Procedure: MINIMALLY INVASIVE AORTIC VALVE REPLACEMENT (AVR);  Surgeon: Purcell Nails, MD;  Location: [facility] OR;  Service: Open Heart Surgery;  Laterality: N/A;   BLADDER SURGERY  2011   bone spur removal Right 08/2022   great toe   CARDIAC CATHETERIZATION     CARPAL TUNNEL RELEASE  2012   CHOLECYSTECTOMY  RIGHT/LEFT HEART CATH AND CORONARY ANGIOGRAPHY N/A 07/08/2017   Procedure: RIGHT/LEFT HEART CATH AND CORONARY ANGIOGRAPHY;  Surgeon: Tonny Bollman, MD;  Location: [facility] INVASIVE CV LAB;  Service: Cardiovascular;  Laterality: N/A;   TEE WITHOUT CARDIOVERSION N/A 07/26/2017   Procedure: TRANSESOPHAGEAL ECHOCARDIOGRAM (TEE);  Surgeon: Purcell Nails, MD;  Location: [facility] OR;  Service: Open Heart Surgery;  Laterality: N/A;   TONSILLECTOMY      Family History  Problem Relation Age of Onset   Stroke  Mother    Leukemia Mother    Stroke Father    Sleep apnea Father    Valvular heart disease Brother    Sleep apnea Brother    Heart Problems Maternal Aunt    Valvular heart disease Maternal Aunt    Valvular heart disease Paternal Grandfather     Social History:  reports that she has never smoked. She has never used smokeless tobacco. She reports that she does not drink alcohol and does not use drugs.  Allergies:  Allergies  Allergen Reactions   Codeine Itching   Prednisone Itching    Medications: I have reviewed the patient's current medications.  The PMH, PSH, Medications, Allergies, and SH were reviewed and updated.  ROS: Constitutional: Negative for fever, weight loss and weight gain. Cardiovascular: Negative for chest pain and dyspnea on exertion. Respiratory: Is not experiencing shortness of breath at rest. Gastrointestinal: Negative for nausea and vomiting. Neurological: Negative for headaches. Psychiatric: The patient is not nervous/anxious  Blood pressure 131/82, pulse 94, height 5\' 3"  (1.6 m), weight 157 lb (71.2 kg), SpO2 97%.  PHYSICAL EXAM:  Exam: General: Well-developed, well-nourished Respiratory Respiratory effort: Equal inspiration and expiration without stridor Cardiovascular Peripheral Vascular: Warm extremities with equal color/perfusion Eyes: No nystagmus with equal extraocular motion bilaterally Neuro/Psych/Balance: Patient oriented to person, place, and time; Appropriate mood and affect; Gait is intact with no imbalance; Cranial nerves I-XII are intact Head and Face Inspection: Normocephalic and atraumatic without mass or lesion Facial Strength: Facial motility symmetric and full bilaterally ENT Pinna: External ear intact and fully developed External canal: Canal is patent with intact skin External Nose: No scar or anatomic deformity Oral cavity/oropharynx: No erythema or exudate, no lesions present Neck Neck and Trachea: Midline trachea  without mass or lesion   Procedure: none  Studies Reviewed: CT head 05/20/21 EXAM: CT HEAD WITHOUT CONTRAST   TECHNIQUE: Contiguous axial images were obtained from the base of the skull through the vertex without intravenous contrast.   RADIATION DOSE REDUCTION: This exam was performed according to the departmental dose-optimization program which includes automated exposure control, adjustment of the mA and/or kV according to patient size and/or use of iterative reconstruction technique.   COMPARISON:  Head CT 07/10/2018.   FINDINGS: Brain: Cerebral volume appears to remain normal for age. No midline shift, ventriculomegaly, mass effect, evidence of mass lesion, intracranial hemorrhage or evidence of cortically based acute infarction. Gray-white matter differentiation is within normal limits throughout the brain. No encephalomalacia identified. Chronic partially empty sella.   Vascular: Calcified atherosclerosis at the skull base. No suspicious intracranial vascular hyperdensity. -+-   Skull: Negative.   Sinuses/Orbits: Visualized paranasal sinuses and mastoids are clear. Tympanic cavities are clear.   Other: Visualized orbits and scalp soft tissues are within normal limits.   IMPRESSION: Stable since 2020 and normal for age non contrast CT appearance of the brain  CT Cervical spine IMPRESSION: 1. Interval ACDF C4 through C7. No hardware loosening and developing interbody arthrodesis suspected, most pronounced at  C4-C5.   2. Underlying chronic C2-C3 ankylosis. Relatively mild degeneration at the intervening C3-C4 level appears stable since the preoperative MRI. C7-T1 adjacent segment remains negative.  Audiogram done 10/29/22 - low to mid frequency SNHL on the right side, WDS was 100% AU at 40 dB. Speech threshold 35 dB on the right and 10 dB on the left. Type A AU.   Assessment/Plan: Encounter Diagnoses  Name Primary?   Asymmetrical sensorineural hearing loss  Yes   Environmental and seasonal allergies    Tinnitus of right ear    Dysfunction of both eustachian tubes    Bilateral temporomandibular joint pain    Nasal congestion      67 year old female with history of prior ACDF who is here for 2 months of persistent right ear discomfort, which was initially described as pain and treated as an ear infection per report.  Symptoms started while she was at the beach.  She received a course of Augmentin.  She is also complaining of ringing in her right ear.  No dizziness or vertigo.  No prior ear surgeries.  She reports history of allergic rhinitis and allergies, takes antihistamine and Flonase on as-needed basis.  On my exam her ear exam is unremarkable, with no evidence of ear infection or middle ear effusion.  She was tender at the TMJ bilaterally, without trismus or subluxation of the joint.  I discussed possible causes of ear pain, which include TMJ syndrome.  We also discussed eustachian tube dysfunction which she could have in the setting of recent ear infection, and nasal congestion and allergy symptoms.  I advised her to take Zyrtec and Flonase daily.  TMJ syndrome care was discussed.  We will obtain audiogram to evaluate her hearing and symptom of right-sided tinnitus.  She will return after testing.  -Audio and RTC after testing - TMJ care -Will consider referral to OMFS if symptoms persist in the future  Update 11/08/22 she reports that she is having some dizziness periodically since our office visit. Audiogram showed unilateral low to mid pitched sensorineural hearing loss right ear. Left ear normal. No ear fullness with dizziness. Usually she has dizziness for a few seconds, when she is standing up, and feels like she needs to sit down. Sx are most c/w BPPV but in the setting of asymmetric hearing loss, cannot rule out CPA lesion.   SNHL R side, asymmetric SNHL and episodes of dizziness/right ear tinnitus  - MRI IAC to rule out CPA lesion - if  negative will refer to vestibular rehab for suspected BPPV - will plan for repeat Audio in a few months  - unclear if sx onset was sudden (reports decreased hearing and tinnitus in right ear 2-3 months now - has reaction to prednisone (severe itching) - will prescribe a steroid taper - and she will take as tolerated - I instructed the patient to stop taking it if she develops sx of adverse reaction or if itching is severe   2. Nasal congestion/post-nasal drainage/eustachian tube dysfunction/environmental seasonal allergies -continue Zyrtec 5 mg daily and Flonase 2  puffs b/l nares BID  3. Dizziness lasting for seconds and triggered with changes in body movement - suspect BPPV vs other causes such as balance issues, cardiac issues, she is already established with Cardiology - will consider vestibular rehab if sx continue   Thank you for allowing me to participate in the care of this patient. Please do not hesitate to contact me with any questions or concerns.   Eusebio Friendly  Irene Pap, MD Otolaryngology [facility] Phone: [phone] Fax: [phone]    11/09/2022, 4:42 AM

## 2022-11-09 ENCOUNTER — Telehealth: Payer: Self-pay

## 2022-11-09 MED ORDER — PREDNISONE 10 MG PO TABS: 10.0000 mg | ORAL_TABLET | Freq: Every day | ORAL | 0 refills | Status: DC

## 2022-11-09 NOTE — Telephone Encounter (Signed)
Authorization#[id number] CPT code 42595 good from 09/17-11/16/2024

## 2022-11-16 ENCOUNTER — Telehealth (INDEPENDENT_AMBULATORY_CARE_PROVIDER_SITE_OTHER): Payer: Self-pay | Admitting: Physician Assistant

## 2022-11-16 NOTE — Telephone Encounter (Signed)
I spoke with Miranda Berger today. She did not take the oral steroids as she gets extremely itchy when taking them. She is going to reach out to her insurance company to see who is within network for oral surgery. She will let us know if they need a referral.

## 2022-11-17 NOTE — Telephone Encounter (Signed)
[facility] confirmed receipt of orders.

## 2022-11-18 DIAGNOSIS — H25043 Posterior subcapsular polar age-related cataract, bilateral: Secondary | ICD-10-CM | POA: Diagnosis not present

## 2022-11-18 DIAGNOSIS — H2512 Age-related nuclear cataract, left eye: Secondary | ICD-10-CM | POA: Diagnosis not present

## 2022-11-18 DIAGNOSIS — H25013 Cortical age-related cataract, bilateral: Secondary | ICD-10-CM | POA: Diagnosis not present

## 2022-11-18 DIAGNOSIS — H2513 Age-related nuclear cataract, bilateral: Secondary | ICD-10-CM | POA: Diagnosis not present

## 2022-11-18 DIAGNOSIS — H18413 Arcus senilis, bilateral: Secondary | ICD-10-CM | POA: Diagnosis not present

## 2022-11-19 ENCOUNTER — Ambulatory Visit (HOSPITAL_COMMUNITY)
Admission: RE | Admit: 2022-11-19 | Discharge: 2022-11-19 | Disposition: A | Discharge status: Home / Self Care | Payer: Medicare PPO | Source: Ambulatory Visit | Attending: Otolaryngology | Admitting: Otolaryngology

## 2022-11-19 DIAGNOSIS — R42 Dizziness and giddiness: Secondary | ICD-10-CM | POA: Diagnosis not present

## 2022-11-19 DIAGNOSIS — H903 Sensorineural hearing loss, bilateral: Secondary | ICD-10-CM | POA: Diagnosis not present

## 2022-11-19 DIAGNOSIS — H9190 Unspecified hearing loss, unspecified ear: Secondary | ICD-10-CM | POA: Diagnosis not present

## 2022-11-19 MED ORDER — GADOBUTROL 1 MMOL/ML IV SOLN
7.0000 mL | Freq: Once | INTRAVENOUS | Status: AC | PRN
  Administered 2022-11-19: 7 mL via INTRAVENOUS

## 2022-11-23 ENCOUNTER — Telehealth (INDEPENDENT_AMBULATORY_CARE_PROVIDER_SITE_OTHER): Payer: Self-pay

## 2022-11-23 NOTE — Telephone Encounter (Signed)
Spoke with pt she says she had a CT scan and read the results on My chart. Pt need to speak with Dr. Arrie Eastern or Trey Paula.

## 2022-12-03 ENCOUNTER — Telehealth: Payer: Self-pay | Admitting: Adult Health

## 2022-12-03 DIAGNOSIS — G4733 Obstructive sleep apnea (adult) (pediatric): Secondary | ICD-10-CM

## 2022-12-03 NOTE — Telephone Encounter (Signed)
Pt reports she has yet to be contacted by DME re: her CPAP issues that were relayed to them in Sept.  Pt also reports that she is unable to sleep for more than 2 hours a night on the machine.  Pt states this is greatly impacting her sleep and she would like a call to discuss.

## 2022-12-06 NOTE — Telephone Encounter (Addendum)
I called [employer] and spoke with staff (terri or tracy?).  They are going to call the patient this afternoon.  She did confirm they received the order to reactivate patient in the system and give her supplies back on 10/28/22.   I reviewed the previous phone note and I see that we were unable to get a download at the last visit and [employer] also stated they cannot get a download either since patient was inactive in their system.  I called the patient and LVM (ok per DPR) advising the patient that [employer] told me they would call her this afternoon. We would need to see a download but she would need to get reactivated with [employer]. I asked her to call us back after she talks to them and also call us if she does not hear from them this afternoon.

## 2022-12-09 NOTE — Telephone Encounter (Signed)
I spoke with [employer] rep. She is going to have John look into this and call us back asap. He saw the patient earlier today. Currently I cannot see the pt tagged into [employer].

## 2022-12-09 NOTE — Telephone Encounter (Signed)
At 2:55 pm pt left a vm in response to [place], RN's call to her.  Pt stated that on her end she was able to resolve things with the DME.  Pt is asking for a call  back from [place], RN to ensure everything else is resolved.

## 2022-12-13 NOTE — Telephone Encounter (Signed)
Pt returned call and LVM. Please call back when available.  

## 2022-12-13 NOTE — Telephone Encounter (Signed)
I see from last OV that a new machine was a possiblity.  I confirmed with [employer] , Terri.  That pt has a Dreamstation and is eligible for a new machine.  I will confer with pt and see what she wants to do.  I LMVM for her if she wants anew machine then to email [facility]) or call, other wise new supplies order already in place.

## 2022-12-13 NOTE — Telephone Encounter (Signed)
I called and LMVM for pt that [employer] states that she has new order for supplies , but if wants a new machine she will need to do a HST, if ok can order this.  (Will need to have a sleep study prior to ordering new machine) to confirm her level of OSA.

## 2022-12-13 NOTE — Telephone Encounter (Signed)
Pt updated in PCO for her cpap usage.  Report uploaded to MM/NP for review).

## 2022-12-14 ENCOUNTER — Other Ambulatory Visit: Payer: Self-pay

## 2022-12-14 NOTE — Telephone Encounter (Signed)
I called pt and she is ok to keep using her (phillips=respironics=dreamstation II) which she received (recall 04/2020).  She is eligible for a new machine if she likes per [employer].  But she is ok to stay with this for now , since she got new mask and is working well for her.  She does understand that if when she needs new machine that she will need to get sleep study and she is ok to do that.  She appreciated call back.

## 2022-12-15 NOTE — Progress Notes (Unsigned)
Cardiology Office Note:    Date:  12/16/2022   ID:  Miranda Berger, Miranda Berger 1955/09/22, MRN [id number]  PCP:  Farris Has, MD  Cardiologist:  Norman Herrlich, MD    Referring MD: Shirlean Mylar, MD    ASSESSMENT:    1. 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 Primary hypertension   3. LBBB (left bundle branch block)   4. Obstructive sleep apnea    PLAN:    In order of problems listed above:  She has done well after aortic valve replacement recovered her postoperative problems tachycardia and hypotension resolved an echocardiogram last fall with normal prosthetic valve function. Improved with not on antihypertensive agents at this time .  No conduction delay at today's EKG obviously her bundle branch block is rate related Stable on CPAP   Next appointment: 1 year   Medication Adjustments/Labs and Tests Ordered: Current medicines are reviewed at length with the patient today.  Concerns regarding medicines are outlined above.  Orders Placed This Encounter  Procedures   EKG 12-Lead   No orders of the defined types were placed in this encounter.    History of Present Illness:    Miranda Berger is a 67 y.o. female with a hx of symptomatic aortic stenosis with tissue AVR 07/26/2017 hypertension sinus tachycardia left bundle branch block and sleep apnea last seen 12/17/2021.  Compliance with diet, lifestyle and medications: Yes  From a cardiology perspective is doing well but still has many stressors in her life partner husband are involved in counseling And all the regular activity program and is not having chest pain edema shortness of breath palpitation or syncope recent labs from her primary care physician show creatinine 0.93 GFR 87 cc potassium 4.2 and sodium 139 08/31/2022  Echocardiogram 1 year ago normal prosthetic valve function Past Medical History:  Diagnosis Date   Abdominal pain 06/22/2012   Anemia    in the past     Anxiety    Aortic stenosis 06/20/2017   Asthma    Chronic interstitial cystitis 06/22/2012   Costochondral chest pain 04/04/2018   Degenerative tear of medial meniscus of right knee 04/21/2016   Depression    Difficult or painful urination 06/22/2012   Dyspareunia 06/22/2012   Family history of adverse reaction to anesthesia    mom had nausea   Female pelvic pain 06/22/2012   GERD (gastroesophageal reflux disease)    Hepatitis    Hepatitis A in 3rd grade?   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   ALPRAZolam (XANAX) 0.5 MG tablet Take 0.25 mg by mouth in the morning and at bedtime.   aspirin EC 81 MG tablet Take 81 mg by mouth daily.   buPROPion (WELLBUTRIN XL) 300 MG 24 hr tablet Take  1 tablet (300 mg total) by mouth daily.   calcium carbonate (OS-CAL) 600 MG TABS tablet Take 600 mg by mouth daily.    cetirizine (ZYRTEC) 5 MG tablet Take 5 mg by mouth daily.   fluticasone (FLONASE) 50 MCG/ACT nasal spray Place 1 spray into both nostrils daily as needed for allergies.    Fluticasone-Salmeterol (ADVAIR) 250-50 MCG/DOSE AEPB Inhale 1 puff into the lungs 2 (two) times daily.   mirtazapine (REMERON) 15 MG tablet Take 7.5 mg by mouth at bedtime.     Multiple Vitamin (MULTIVITAMIN) capsule Take 1 capsule by mouth daily.   omeprazole (PRILOSEC) 20 MG capsule Take 20 mg by mouth daily.      EKGs/Labs/Other Studies Reviewed:    The following studies were reviewed today:     EKG Interpretation Date/Time:  Thursday December 16 2022 09:20:44 EDT Ventricular Rate:  87 PR Interval:  168 QRS Duration:  84 QT Interval:  384 QTC Calculation: 462 R Axis:   55  Text Interpretation: Normal sinus rhythm Normal EKG Confirmed by Norman Herrlich ([id number]) on 12/16/2022 9:30:29 AM    Physical Exam:    VS:  BP 138/72   Pulse 87   Ht 5\' 3"  (1.6 m)   Wt 165 lb 9.6 oz (75.1 kg)   SpO2 98%   BMI 29.33 kg/m     Wt Readings from Last 3 Encounters:  12/16/22 165 lb 9.6 oz (75.1 kg)  11/08/22 157 lb (71.2 kg)  10/18/22 159 lb 3.2 oz (72.2 kg)     GEN:   Well nourished, well developed in no acute distress HEENT: Normal NECK: No JVD; No carotid bruits LYMPHATICS: No lymphadenopathy CARDIAC:  RRR, no murmurs, rubs, gallops RESPIRATORY:  Clear to auscultation without rales, wheezing or rhonchi  ABDOMEN: Soft, non-tender, non-distended MUSCULOSKELETAL:  No edema; No deformity  SKIN: Warm and dry NEUROLOGIC:  Alert and oriented x 3 PSYCHIATRIC:  Normal affect    Signed, Norman Herrlich, MD  12/16/2022 9:53 AM    [facility]

## 2022-12-16 ENCOUNTER — Ambulatory Visit: Payer: Medicare PPO | Attending: Cardiology | Admitting: Cardiology

## 2022-12-16 ENCOUNTER — Encounter: Payer: Self-pay | Admitting: Cardiology

## 2022-12-16 VITALS — BP 138/72 | HR 87 | Ht 63.0 in | Wt 165.6 lb

## 2022-12-16 DIAGNOSIS — F331 Major depressive disorder, recurrent, moderate: Secondary | ICD-10-CM | POA: Diagnosis not present

## 2022-12-16 DIAGNOSIS — I10 Essential (primary) hypertension: Secondary | ICD-10-CM

## 2022-12-16 DIAGNOSIS — I447 Left bundle-branch block, unspecified: Secondary | ICD-10-CM | POA: Diagnosis not present

## 2022-12-16 DIAGNOSIS — G4733 Obstructive sleep apnea (adult) (pediatric): Secondary | ICD-10-CM

## 2022-12-16 DIAGNOSIS — Z953 Presence of xenogenic heart valve: Secondary | ICD-10-CM | POA: Diagnosis not present

## 2022-12-16 NOTE — Patient Instructions (Signed)
Medication Instructions:  Your physician recommends that you continue on your current medications as directed. Please refer to the Current Medication list given to you today.  *If you need a refill on your cardiac medications before your next appointment, please call your pharmacy*   Lab Work: None If you have labs (blood work) drawn today and your tests are completely normal, you will receive your results only by: MyChart Message (if you have MyChart) OR A paper copy in the mail If you have any lab test that is abnormal or we need to change your treatment, we will call you to review the results.   Testing/Procedures: None   Follow-Up: At [facility], you and your health needs are our priority.  As part of our continuing mission to provide you with exceptional heart care, we have created designated Provider Care Teams.  These Care Teams include your primary Cardiologist (physician) and Advanced Practice Providers (APPs -  Physician Assistants and Nurse Practitioners) who all work together to provide you with the care you need, when you need it.  We recommend signing up for the patient portal called "MyChart".  Sign up information is provided on this After Visit Summary.  MyChart is used to connect with patients for Virtual Visits (Telemedicine).  Patients are able to view lab/test results, encounter notes, upcoming appointments, etc.  Non-urgent messages can be sent to your provider as well.   To learn more about what you can do with MyChart, go to [email or web address].    Your next appointment:   1 year(s)  Provider:   Brian Munley, MD    Other Instructions None  

## 2022-12-20 DIAGNOSIS — H2512 Age-related nuclear cataract, left eye: Secondary | ICD-10-CM | POA: Diagnosis not present

## 2022-12-21 DIAGNOSIS — H2511 Age-related nuclear cataract, right eye: Secondary | ICD-10-CM | POA: Diagnosis not present

## 2022-12-23 ENCOUNTER — Ambulatory Visit (INDEPENDENT_AMBULATORY_CARE_PROVIDER_SITE_OTHER): Payer: Medicare PPO | Admitting: Otolaryngology

## 2022-12-27 NOTE — Telephone Encounter (Signed)
I called pt and she is ok to do both simultaneously.  Hst and new machine.  Once ordered will let [employer] know so they can start process of new machine.  HST to follow.  Pt appreciative.

## 2022-12-27 NOTE — Telephone Encounter (Signed)
I would recommend that we do a home sleep test.  However if she is unable to use her machine we can go ahead and order a new machine as well.  I do not want her to go a long period of time without having a CPAP to use

## 2022-12-27 NOTE — Telephone Encounter (Signed)
Pt called and LVM stating that her Cpap machine is having trouble again and not turning on everyday. She states that she spoke to [facility] and they informed her to call the office and have provider to put in an order for a new Cpap machine. Pt states that she would like this done as soon as possible due to her not being able to use the old one until she gets the new one. Please advise.

## 2022-12-27 NOTE — Addendum Note (Signed)
Addended by: Guy Begin on: 12/27/2022 03:07 PM   Modules accepted: Orders

## 2022-12-30 NOTE — Addendum Note (Signed)
Addended by: Enedina Finner on: 12/30/2022 04:38 PM   Modules accepted: Orders

## 2022-12-30 NOTE — Addendum Note (Signed)
Addended by: Raynald Kemp A on: 12/30/2022 05:24 PM   Modules accepted: Orders

## 2022-12-31 ENCOUNTER — Ambulatory Visit (INDEPENDENT_AMBULATORY_CARE_PROVIDER_SITE_OTHER): Payer: Medicare PPO | Admitting: Otolaryngology

## 2022-12-31 ENCOUNTER — Encounter (INDEPENDENT_AMBULATORY_CARE_PROVIDER_SITE_OTHER): Payer: Self-pay | Admitting: Otolaryngology

## 2022-12-31 VITALS — BP 149/82

## 2022-12-31 DIAGNOSIS — R0981 Nasal congestion: Secondary | ICD-10-CM

## 2022-12-31 DIAGNOSIS — H903 Sensorineural hearing loss, bilateral: Secondary | ICD-10-CM

## 2022-12-31 DIAGNOSIS — H9041 Sensorineural hearing loss, unilateral, right ear, with unrestricted hearing on the contralateral side: Secondary | ICD-10-CM

## 2022-12-31 DIAGNOSIS — R2689 Other abnormalities of gait and mobility: Secondary | ICD-10-CM

## 2022-12-31 DIAGNOSIS — H6993 Unspecified Eustachian tube disorder, bilateral: Secondary | ICD-10-CM

## 2022-12-31 DIAGNOSIS — J3089 Other allergic rhinitis: Secondary | ICD-10-CM

## 2022-12-31 DIAGNOSIS — R42 Dizziness and giddiness: Secondary | ICD-10-CM

## 2022-12-31 DIAGNOSIS — H9311 Tinnitus, right ear: Secondary | ICD-10-CM

## 2022-12-31 NOTE — Patient Instructions (Signed)
-   schedule vestibular rehab - see PCP to discuss other findings on MRI you had - if you need referral to Neurology they can help with that - continue Zyrtec and Flonase - we will plan for annual hearing test

## 2022-12-31 NOTE — Progress Notes (Signed)
ENT Progress Note:  Update 12/31/22 She had MRI IAC and it did not show CPA lesions. She had empty sella turcica on MRI brain 40 yrs ago when she was having infertility workup. Here to discuss results.   Update 11/09/22: She completed her hearing evaluation and it showed asymmetric hearing loss with unilateral low-frequency hearing loss on the right side.  Her ear discomfort is somewhat better.  She is using Flonase daily and takes Zyrtec.  She reports intermittent seconds like feeling of dizziness off-balance sensation.  Denies frank vertigo.  Symptoms typically occur when she stands up, resolves when she sits down and takes a break.  History of aortic valve replacement, being followed by cardiology last seen in October 2024.   Initial HPI 10/04/22:  Reason for Consult: ear fullness and R otalgia    HPI: Miranda Berger is an 67 y.o. female with hx pre-diabetes, here for 2 months of right ear discomfort, and sensation of fluid in her ears. She was at the beach and started to have pain. She saw a physician, and was told to see ENT. No ear drops or medications given at the time. She still has mild discomfort in her right ear, she has tinnitus on the right side described as static noise. She denies having prior hearing evaluation. Reports hx of bruxism. No ear surgeries. She has environmental allergies and asthma. She is on Zyrtec. She has albuterol as needed. She uses Flonase daily. She was given Augmentin for a suspected ear infection in June based on records in epic.    Records Reviewed:  Cardiology Note 12/18/22  From a cardiology perspective doing well normal AVR function and the mitral valve today is well visualized and is normal with physiologic regurgitation Not present on recent EKG obviously rate related I think as opposed to a change in treatment 2 to 3 weeks of good blood pressure determinations and make a decision especially in view of her previous symptomatic hypotension Can continue her  cardiac medications including a low-dose beta-blocker and aspirin.    Past Medical History:  Diagnosis Date   Abdominal pain 06/22/2012   Anemia    in the past    Anxiety    Aortic stenosis 06/20/2017   Asthma    Chronic interstitial cystitis 06/22/2012   Costochondral chest pain 04/04/2018   Degenerative tear of medial meniscus of right knee 04/21/2016   Depression    Difficult or painful urination 06/22/2012   Dyspareunia 06/22/2012   Family history of adverse reaction to anesthesia    mom had nausea   Female pelvic pain 06/22/2012   GERD (gastroesophageal reflux disease)    Hepatitis    Hepatitis A in 3rd grade?   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   Obstructive sleep apnea    Pericarditis 09/12/2017   PONV (postoperative nausea and vomiting)    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 07/26/2017   [employer], size 23   Urinary urgency 06/22/2012    Past Surgical History:  Procedure Laterality Date   ABDOMINAL HYSTERECTOMY     AORTIC VALVE REPLACEMENT N/A 07/26/2017   Procedure: MINIMALLY INVASIVE AORTIC VALVE REPLACEMENT (AVR);  Surgeon: Purcell Nails, MD;  Location[facility] OR;  Service: Open Heart  Surgery;  Laterality: N/A;   BLADDER SURGERY  2011   bone spur removal Right 08/2022   great toe   CARDIAC CATHETERIZATION     CARPAL TUNNEL RELEASE  2012   CHOLECYSTECTOMY     RIGHT/LEFT HEART CATH AND CORONARY ANGIOGRAPHY N/A 07/08/2017   Procedure: RIGHT/LEFT HEART CATH AND CORONARY ANGIOGRAPHY;  Surgeon: Tonny Bollman, MD;  Location: [facility] INVASIVE CV LAB;  Service: Cardiovascular;  Laterality: N/A;   TEE WITHOUT CARDIOVERSION N/A 07/26/2017    Procedure: TRANSESOPHAGEAL ECHOCARDIOGRAM (TEE);  Surgeon: Purcell Nails, MD;  [facility] OR;  Service: Open Heart Surgery;  Laterality: N/A;   TONSILLECTOMY      Family History  Problem Relation Age of Onset   Stroke Mother    Leukemia Mother    Stroke Father    Sleep apnea Father    Valvular heart disease Brother    Sleep apnea Brother    Heart Problems Maternal Aunt    Valvular heart disease Maternal Aunt    Valvular heart disease Paternal Grandfather     Social History:  reports that she has never smoked. She has never used smokeless tobacco. She reports that she does not drink alcohol and does not use drugs.  Allergies:  Allergies  Allergen Reactions   Codeine Itching   Prednisone Itching    Medications: I have reviewed the patient's current medications.  The PMH, PSH, Medications, Allergies, and SH were reviewed and updated.  ROS: Constitutional: Negative for fever, weight loss and weight gain. Cardiovascular: Negative for chest pain and dyspnea on exertion. Respiratory: Is not experiencing shortness of breath at rest. Gastrointestinal: Negative for nausea and vomiting. Neurological: Negative for headaches. Psychiatric: The patient is not nervous/anxious  Blood pressure (!) 149/82, SpO2 97%.  PHYSICAL EXAM:  Exam: General: Well-developed, well-nourished Respiratory Respiratory effort: Equal inspiration and expiration without stridor Cardiovascular Peripheral Vascular: Warm extremities with equal color/perfusion Eyes: No nystagmus with equal extraocular motion bilaterally Neuro/Psych/Balance: Patient oriented to person, place, and time; Appropriate mood and affect; Gait is intact with no imbalance; Cranial nerves I-XII are intact Head and Face Inspection: Normocephalic and atraumatic without mass or lesion Facial Strength: Facial motility symmetric and full bilaterally ENT Pinna: External ear intact and fully developed External canal: Canal is patent  with intact skin External Nose: No scar or anatomic deformity Oral cavity/oropharynx: No erythema or exudate, no lesions present Neck Neck and Trachea: Midline trachea without mass or lesion   Procedure: none  Studies Reviewed: CT head 05/20/21 EXAM: CT HEAD WITHOUT CONTRAST   TECHNIQUE: Contiguous axial images were obtained from the base of the skull through the vertex without intravenous contrast.   RADIATION DOSE REDUCTION: This exam was performed according to the departmental dose-optimization program which includes automated exposure control, adjustment of the mA and/or kV according to patient size and/or use of iterative reconstruction technique.   COMPARISON:  Head CT 07/10/2018.   FINDINGS: Brain: Cerebral volume appears to remain normal for age. No midline shift, ventriculomegaly, mass effect, evidence of mass lesion, intracranial hemorrhage or evidence of cortically based acute infarction. Gray-white matter differentiation is within normal limits throughout the brain. No encephalomalacia identified. Chronic partially empty sella.   Vascular: Calcified atherosclerosis at the skull base. No suspicious intracranial vascular hyperdensity. -+-   Skull: Negative.   Sinuses/Orbits: Visualized paranasal sinuses and mastoids are clear. Tympanic cavities are clear.   Other: Visualized orbits and scalp soft tissues are within normal limits.   IMPRESSION: Stable since 2020 and normal for  age non contrast CT appearance of the brain  CT Cervical spine IMPRESSION: 1. Interval ACDF C4 through C7. No hardware loosening and developing interbody arthrodesis suspected, most pronounced at C4-C5.   2. Underlying chronic C2-C3 ankylosis. Relatively mild degeneration at the intervening C3-C4 level appears stable since the preoperative MRI. C7-T1 adjacent segment remains negative.  Audiogram done 10/29/22 - low to mid frequency SNHL on the right side, WDS was 100% AU at 40 dB.  Speech threshold 35 dB on the right and 10 dB on the left. Type A AU.   MRI IAC 11/19/22 FINDINGS: Brain:   Cerebral volume is normal.   Mild multifocal T2 FLAIR hyperintense signal abnormality within the cerebral white matter, nonspecific but most often secondary to chronic small vessel ischemia.   There are a few chronic microhemorrhages scattered within the supratentorial brain and cerebellum.   Partially empty sella turcica.   No cortical encephalomalacia is identified.   No evidence of an intracranial mass. Specifically, no cerebellopontine angle or internal auditory canal mass is demonstrated. Unremarkable appearance of the 7th and 8th cranial nerves bilaterally.   There is no acute infarct.   No extra-axial fluid collection.   No midline shift.   No pathologic intracranial enhancement identified.   Vascular: Maintained flow voids within the proximal large arterial vessels.   Skull and upper cervical spine: No focal suspicious marrow lesion. Susceptibility artifact arising from ACDF hardware.   Sinuses/Orbits: No mass or acute finding within the imaged orbits. No significant paranasal sinus disease.   Other: Small-volume fluid within the left mastoid air cells.   IMPRESSION: 1. No evidence of an acute intracranial abnormality. 2. No cerebellopontine angle or internal auditory canal mass. 3. No etiology of hearing loss is identified. 4. Mild multifocal T2 FLAIR hyperintense signal abnormality within the cerebral white matter, nonspecific but most often secondary to chronic small vessel ischemia. 5. There are a few chronic microhemorrhages scattered within the supratentorial brain and cerebellum. 6. Partially empty sella turcica. This finding can reflect incidental anatomic variation, or alternatively, it can be associated with chronic idiopathic intracranial hypertension (pseudotumor cerebri). 7. Small-volume fluid within the left mastoid air  cells.  Assessment/Plan: Encounter Diagnoses  Name Primary?   Dizziness and giddiness Yes   Asymmetrical sensorineural hearing loss    Tinnitus of right ear    Environmental and seasonal allergies    Nasal congestion    Dysfunction of both eustachian tubes       67 year old female with history of prior ACDF who is here for 2 months of persistent right ear discomfort, which was initially described as pain and treated as an ear infection per report.  Symptoms started while she was at the beach.  She received a course of Augmentin.  She is also complaining of ringing in her right ear.  No dizziness or vertigo.  No prior ear surgeries.  She reports history of allergic rhinitis and allergies, takes antihistamine and Flonase on as-needed basis.  On my exam her ear exam is unremarkable, with no evidence of ear infection or middle ear effusion.  She was tender at the TMJ bilaterally, without trismus or subluxation of the joint.  I discussed possible causes of ear pain, which include TMJ syndrome.  We also discussed eustachian tube dysfunction which she could have in the setting of recent ear infection, and nasal congestion and allergy symptoms.  I advised her to take Zyrtec and Flonase daily.  TMJ syndrome care was discussed.  We will obtain audiogram  to evaluate her hearing and symptom of right-sided tinnitus.  She will return after testing.  -Audio and RTC after testing - TMJ care -Will consider referral to OMFS if symptoms persist in the future  Update 11/08/22 she reports that she is having some dizziness periodically since our office visit. Audiogram showed unilateral low to mid pitched sensorineural hearing loss right ear. Left ear normal. No ear fullness with dizziness. Usually she has dizziness for a few seconds, when she is standing up, and feels like she needs to sit down. Sx are most c/w BPPV but in the setting of asymmetric hearing loss, cannot rule out CPA lesion.   SNHL R side, asymmetric  SNHL and episodes of dizziness/right ear tinnitus  - MRI IAC to rule out CPA lesion - if negative will refer to vestibular rehab for suspected BPPV - will plan for repeat Audio in a few months  - unclear if sx onset was sudden (reports decreased hearing and tinnitus in right ear 2-3 months now - has reaction to prednisone (severe itching) - will prescribe a steroid taper - and she will take as tolerated - I instructed the patient to stop taking it if she develops sx of adverse reaction or if itching is severe  2. Nasal congestion/post-nasal drainage/eustachian tube dysfunction/environmental seasonal allergies -continue Zyrtec 5 mg daily and Flonase 2  puffs b/l nares BID  3. Dizziness lasting for seconds and triggered with changes in body movement - suspect BPPV vs other causes such as balance issues, cardiac issues, she is already established with Cardiology - will consider vestibular rehab if sx continue    Update 12/31/22 She has dizziness episodes now and then still. She had negative MRI IAC (aside from empty sella and micro-hemorrhages in cerebellum. She also has dizziness when she stands up or gets on the floor or lying down on the floor when she gets feeling of off balance. She also had it in the car.   SNHL R side, asymmetric SNHL and episodes of dizziness/right ear tinnitus  - MRI IAC negative for CPA lesion  - will plan for repeat Audio in 12 months (ordered) - we already treated with prednisone taper - will monitor with serial Audiograms - will consider vestibular testing in the future  2. Nasal congestion/post-nasal drainage/eustachian tube dysfunction/environmental seasonal allergies -continue Zyrtec 5 mg daily and Flonase 2  puffs b/l nares BID  3. Dizziness and balance problems  - will send a referral to vestibular rehab (referral sent) - will consider vestibular testing in the future    Ashok Croon, MD Otolaryngology [facility] Phone:  [phone] Fax: [phone]    12/31/2022, 9:58 AM

## 2023-01-03 NOTE — Telephone Encounter (Signed)
Order updated

## 2023-01-03 NOTE — Addendum Note (Signed)
Addended by: Enedina Finner on: 01/03/2023 03:54 PM   Modules accepted: Orders

## 2023-01-10 DIAGNOSIS — H2511 Age-related nuclear cataract, right eye: Secondary | ICD-10-CM | POA: Diagnosis not present

## 2023-01-24 ENCOUNTER — Ambulatory Visit (INDEPENDENT_AMBULATORY_CARE_PROVIDER_SITE_OTHER): Payer: Medicare PPO | Admitting: Audiology

## 2023-01-24 DIAGNOSIS — H9041 Sensorineural hearing loss, unilateral, right ear, with unrestricted hearing on the contralateral side: Secondary | ICD-10-CM | POA: Diagnosis not present

## 2023-01-24 NOTE — Progress Notes (Signed)
  [address] [place], [place] [address] [phone]  Audiological Evaluation    Name: Miranda Berger     DOB:   01/14/1956      MRN:   [id number]                                                                                     Service Date: 01/24/2023     Accompanied by: none   Patient comes today after Dr. Irene Pap, ENT sent a referral for a hearing evaluation due to concerns with hearing loss asymmetry.   Symptoms Yes Details  Hearing loss  [x]  Previous audiogram completed at [facility] on 10-29-22 indicated right sensorineural hearing loss from [phone] Hz.  Tinnitus  [x]  Reports a "tunnel" sound in her right ear.  Ear pain/ Ear infections  [x]  Reports had right ear pain back in June, but she no longer has it.  Balance problems  [x]  Reports to be off balance, sometimes some spinning sensation when  looking up or bending down.  Noise exposure  []    Previous ear surgeries  []    Family history  []    Amplification  []    Other  []      Otoscopy: Right ear: Clear external ear canals and notable landmarks visualized on the tympanic membrane. Left ear:  Clear external ear canals and notable landmarks visualized on the tympanic membrane.  Tympanometry: Right ear: Type A- Normal external ear canal volume with normal middle ear pressure and tympanic membrane compliance. Left ear: Type A- Normal external ear canal volume with normal middle ear pressure and tympanic membrane compliance.   Pure tone Audiometry:  Right ear- Normal hearing at 250 Hz, then moderate sensorineural hearing loss from [phone] Hz, rising to normal from 1500-8000 Hz.    Left ear-  Normal hearing from [phone] Hz.    The hearing test results were completed under headphones and results are deemed to be of good reliability. Test technique:  conventional     Speech Audiometry: Right ear- Speech Reception Threshold (SRT) was obtained at 30 dBHL Left ear-Speech Reception  Threshold (SRT) was obtained at 15 dBHL   Word Recognition Score Tested using NU-6 (MLV) Right ear: 96% was obtained at a presentation level of 75 dBHL with contralateral masking which is deemed as  excellent.  Left ear: 96% was obtained at a presentation level of 60 dBHL with contralateral masking which is deemed as  excellent.    Impression: The significant difference in pure-tone thresholds between ears continues to be observed, worse in the right ear. There is not a significant difference in the word recognition score in between ears.    Recommendations: Follow up with ENT as scheduled . Return for a hearing evaluation if concerns with hearing changes arise or per MD recommendation.   Harumi Yamin MARIE LEROUX-MARTINEZ, AUD

## 2023-02-02 DIAGNOSIS — R42 Dizziness and giddiness: Secondary | ICD-10-CM | POA: Diagnosis not present

## 2023-02-02 DIAGNOSIS — J45909 Unspecified asthma, uncomplicated: Secondary | ICD-10-CM | POA: Diagnosis not present

## 2023-02-02 DIAGNOSIS — Z0000 Encounter for general adult medical examination without abnormal findings: Secondary | ICD-10-CM | POA: Diagnosis not present

## 2023-02-02 DIAGNOSIS — R7303 Prediabetes: Secondary | ICD-10-CM | POA: Diagnosis not present

## 2023-02-02 DIAGNOSIS — I10 Essential (primary) hypertension: Secondary | ICD-10-CM | POA: Diagnosis not present

## 2023-02-02 DIAGNOSIS — Z952 Presence of prosthetic heart valve: Secondary | ICD-10-CM | POA: Diagnosis not present

## 2023-02-02 DIAGNOSIS — F411 Generalized anxiety disorder: Secondary | ICD-10-CM | POA: Diagnosis not present

## 2023-02-02 DIAGNOSIS — Z23 Encounter for immunization: Secondary | ICD-10-CM | POA: Diagnosis not present

## 2023-02-09 ENCOUNTER — Ambulatory Visit: Payer: Medicare PPO | Admitting: Neurology

## 2023-02-09 DIAGNOSIS — G4733 Obstructive sleep apnea (adult) (pediatric): Secondary | ICD-10-CM

## 2023-02-17 ENCOUNTER — Ambulatory Visit: Payer: Medicare PPO

## 2023-02-25 DIAGNOSIS — R0981 Nasal congestion: Secondary | ICD-10-CM | POA: Diagnosis not present

## 2023-02-25 DIAGNOSIS — R5383 Other fatigue: Secondary | ICD-10-CM | POA: Diagnosis not present

## 2023-02-25 DIAGNOSIS — J209 Acute bronchitis, unspecified: Secondary | ICD-10-CM | POA: Diagnosis not present

## 2023-02-25 DIAGNOSIS — J329 Chronic sinusitis, unspecified: Secondary | ICD-10-CM | POA: Diagnosis not present

## 2023-02-25 DIAGNOSIS — R058 Other specified cough: Secondary | ICD-10-CM | POA: Diagnosis not present

## 2023-03-01 ENCOUNTER — Ambulatory Visit: Payer: Medicare PPO

## 2023-03-01 ENCOUNTER — Telehealth: Payer: Self-pay | Admitting: Adult Health

## 2023-03-01 NOTE — Telephone Encounter (Signed)
 Pt states she hasn't heard anything regarding her HSS results done 12/18. Requesting call back

## 2023-03-03 DIAGNOSIS — J4530 Mild persistent asthma, uncomplicated: Secondary | ICD-10-CM | POA: Diagnosis not present

## 2023-03-03 DIAGNOSIS — J40 Bronchitis, not specified as acute or chronic: Secondary | ICD-10-CM | POA: Diagnosis not present

## 2023-03-03 DIAGNOSIS — J8283 Eosinophilic asthma: Secondary | ICD-10-CM | POA: Diagnosis not present

## 2023-03-09 ENCOUNTER — Other Ambulatory Visit: Payer: Self-pay | Admitting: Family Medicine

## 2023-03-09 ENCOUNTER — Ambulatory Visit
Admission: RE | Admit: 2023-03-09 | Discharge: 2023-03-09 | Disposition: A | Discharge status: Home / Self Care | Payer: Medicare PPO | Source: Ambulatory Visit | Attending: Family Medicine | Admitting: Family Medicine

## 2023-03-09 DIAGNOSIS — R0989 Other specified symptoms and signs involving the circulatory and respiratory systems: Secondary | ICD-10-CM

## 2023-03-09 DIAGNOSIS — R0602 Shortness of breath: Secondary | ICD-10-CM | POA: Diagnosis not present

## 2023-03-09 DIAGNOSIS — R059 Cough, unspecified: Secondary | ICD-10-CM | POA: Diagnosis not present

## 2023-03-11 ENCOUNTER — Telehealth: Payer: Self-pay | Admitting: Otolaryngology

## 2023-03-11 NOTE — Telephone Encounter (Signed)
Pt called asking for an apt with Dr Irene Pap for antibiotics, Dr Irene Pap did not prescribe any, then pt stated her pcp did and told her to come to ENT to see [place]. I called the providers office with the pt so we could all be on the same page. The PCP nurse Dawn said pt can f/u with the pcp and then if its needed they can send a referral to ENT if it is for something new.

## 2023-03-11 NOTE — Therapy (Signed)
OUTPATIENT PHYSICAL THERAPY VESTIBULAR EVALUATION     Patient Name: Miranda Berger MRN: [id number] DOB:05/11/1955, 68 y.o., female Today's Date: 03/14/2023  END OF SESSION:  PT End of Session - 03/14/23 1402     Visit Number 1    Number of Visits 7    Date for PT Re-Evaluation 04/25/23    Authorization Type [employer]   auth submitted   PT Start Time 1314    PT Stop Time 1400    PT Time Calculation (min) 46 min    Activity Tolerance Patient tolerated treatment well    Behavior During Therapy [facility] for tasks assessed/performed;Anxious             Past Medical History:  Diagnosis Date   Abdominal pain 06/22/2012   Anemia    in the past    Anxiety    Aortic stenosis 06/20/2017   Asthma    Chronic interstitial cystitis 06/22/2012   Costochondral chest pain 04/04/2018   Degenerative tear of medial meniscus of right knee 04/21/2016   Depression    Difficult or painful urination 06/22/2012   Dyspareunia 06/22/2012   Family history of adverse reaction to anesthesia    mom had nausea   Female pelvic pain 06/22/2012   GERD (gastroesophageal reflux disease)    Hepatitis    Hepatitis A in 3rd grade?   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   Obstructive sleep apnea    Pericarditis 09/12/2017   PONV (postoperative nausea and vomiting)    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 07/26/2017   [employer], size 23   Urinary urgency 06/22/2012   Past Surgical History:  Procedure Laterality Date   ABDOMINAL HYSTERECTOMY     AORTIC VALVE REPLACEMENT N/A 07/26/2017   Procedure: MINIMALLY INVASIVE AORTIC VALVE REPLACEMENT (AVR);  Surgeon: Purcell Nails, MD;  Location: [facility] OR;  Service: Open Heart Surgery;  Laterality: N/A;   BLADDER SURGERY  2011   bone spur removal Right 08/2022   great toe   CARDIAC CATHETERIZATION     CARPAL TUNNEL RELEASE  2012   CHOLECYSTECTOMY     RIGHT/LEFT HEART CATH AND CORONARY ANGIOGRAPHY N/A 07/08/2017   Procedure: RIGHT/LEFT HEART CATH AND CORONARY ANGIOGRAPHY;  Surgeon: Tonny Bollman, MD;  Location: [facility] INVASIVE CV LAB;  Service: Cardiovascular;  Laterality: N/A;   TEE WITHOUT CARDIOVERSION N/A 07/26/2017   Procedure: TRANSESOPHAGEAL ECHOCARDIOGRAM (TEE);  Surgeon: Purcell Nails, MD;  Location: [facility] OR;  Service: Open Heart Surgery;  Laterality: N/A;   TONSILLECTOMY     Patient Active Problem List   Diagnosis Date Noted   Anemia 12/01/2021   Depression 12/01/2021   Family history of adverse reaction to anesthesia 12/01/2021   GERD (gastroesophageal reflux disease) 12/01/2021   Hepatitis 12/01/2021   PONV (postoperative nausea and vomiting) 12/01/2021   History of hypertension 12/01/2021   Major depressive disorder, single episode, severe, with psychosis (HCC) 07/11/2018   MDD (major depressive disorder), recurrent severe, 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   Costochondral chest pain 04/04/2018   Pericarditis 09/12/2017   LBBB (left bundle branch block) 08/08/2017   Obstructive sleep apnea    Anxiety    Asthma    S/P minimally invasive aortic valve replacement with bioprosthetic  valve 07/26/2017   Hypertension 06/21/2017   Aortic stenosis 06/20/2017   Degenerative tear of medial meniscus of right knee 04/21/2016   High-tone pelvic floor dysfunction 12/26/2013   Difficult or painful urination 06/22/2012   Chronic interstitial cystitis 06/22/2012   Dyspareunia 06/22/2012   Female pelvic pain 06/22/2012   Urinary urgency 06/22/2012   Abdominal pain 06/22/2012    PCP: Farris Has, MD REFERRING PROVIDER: Farris Has, MD   REFERRING DIAG: R42 (ICD-10-CM) - Dizziness and  giddiness  THERAPY DIAG:  Dizziness and giddiness  Unsteadiness on feet  ONSET DATE: 1 year  Rationale for Evaluation and Treatment: Rehabilitation  SUBJECTIVE:   SUBJECTIVE STATEMENT:  Patient reports dizziness for a year- PCP tried some exercises with her that did not help. Dizziness worse with bending, sudden movements, getting up from the floor. Has some problems with the R ear (high pitched noises and pain) but the ENT did not find anything wrong. Denies head trauma, infection/illness, vision changes/double vision, hearing loss. Reports sinus HA's "which sometimes turn into migraines." Reports problems with her sinuses and HA's a couple times a week and sometimes has neck pain associated with it. Had cataract surgery Oct and Nov 2024. Reports problems with her balance.   Pt accompanied by: self  PERTINENT HISTORY: Anemia, anxiety, asthma, depression, GERD, HTN, LBBB, pericarditis  PAIN:  Are you having pain? No  PRECAUTIONS: None  RED FLAGS: None   WEIGHT BEARING RESTRICTIONS: No  FALLS: Has patient fallen in last 6 months? No  LIVING ENVIRONMENT: Lives with: lives alone Lives in: House/apartment Stairs:  3-4 steps to enter; 1 story home  Has following equipment at home: None  PLOF: Independent; retired; likes reading, sewing, crafts   PATIENT GOALS: improve dizziness   OBJECTIVE:  Note: Objective measures were completed at Evaluation unless otherwise noted.  DIAGNOSTIC FINDINGS: 11/19/22 brain/AIC MRI:  No evidence of an acute intracranial abnormality.Mild multifocal T2 FLAIR hyperintense signal abnormality within the cerebral white matter, nonspecific but most often secondary to chronic small vessel ischemia. 5. There are a few chronic microhemorrhages scattered within the supratentorial brain and cerebellum. 6. Partially empty sella turcica. This finding can reflect incidental anatomic variation, or alternatively, it can be associated with chronic  idiopathic intracranial hypertension   COGNITION: Overall cognitive status: Within functional limits for tasks assessed   SENSATION: Pt reports occasional UE N/T depending on sleeping position   COORDINATION:  Alternating pronation/supination: slow B Alternating toe tap: WNL B Finger to nose: WNL   POSTURE:  rounded shoulders, slightly forward head, guarded  GAIT: Gait pattern: Slightly unsteady, R-ward lean Assistive device utilized: None Level of assistance: Modified independence    PATIENT SURVEYS:  FOTO [phone]  VESTIBULAR ASSESSMENT:  GENERAL OBSERVATION: pt wears readers   OCULOMOTOR EXAM:  Ocular Alignment: normal  Ocular ROM: No Limitations  Spontaneous Nystagmus: absent  Gaze-Induced Nystagmus: absent  Smooth Pursuits: saccades ~3 inferiorly; c/o hard to focus/woozy  Saccades: hypometric/undershoots slightly inferiorly   Convergence/Divergence: ~6 inches d/t L eye   VESTIBULAR - OCULAR REFLEX:   Slow VOR: Comment: small movements, especially reduced to R; no sx  VOR Cancellation: Normal  Head-Impulse Test: HIT Right: negative HIT Left: negative      POSITIONAL TESTING:  Right Roll Test: negative Left Roll Test: negative   Right Sidelying: negative; reports feeling "off" upon lying down and sitting up Left Sidelying: negative; reports feeling "off" upon lying down and sitting up but less intense   Right Dix-Hallpike: negative; reports feeling "off"  upon lying down and sitting up; worse on this side  Left Dix-Hallpike:negative; reports feeling "off" upon lying down and sitting up                                                                                                                            TREATMENT DATE: 03/14/23   PATIENT EDUCATION: Education details: prognosis, POC, HEP Person educated: Patient Education method: Explanation, Demonstration, Tactile cues, Verbal cues, and Handouts Education comprehension: verbalized understanding  and returned demonstration  HOME EXERCISE PROGRAM: Access Code: [email or web address] URL: [email or web address] Date: 03/14/2023 Prepared by: [facility] - Outpatient  Rehab - [facility]  Exercises - Brandt-Daroff Vestibular Exercise  - 1 x daily - 5 x weekly - 2 sets - 10 reps - 3-5 hold - Seated Horizontal Smooth Pursuit  - 1 x daily - 5 x weekly - 2 sets - 10 reps - Seated Vertical Smooth Pursuit  - 1 x daily - 5 x weekly - 2 sets - 10 reps   GOALS: Goals reviewed with patient? Yes  SHORT TERM GOALS: Target date: 04/04/2023  Patient to be independent with initial HEP. Baseline: HEP initiated Goal status: INITIAL    LONG TERM GOALS: Target date: 04/25/2023  Patient to be independent with advanced HEP. Baseline: Not yet initiated  Goal status: INITIAL  Patient to report 0/10 dizziness with quick head movements. Baseline: symptomatic  Goal status: INITIAL  Patient will report 0/10 dizziness with bed mobility.  Baseline: Symptomatic  Goal status: INITIAL  Patient to demonstrate mild-moderate sway with M-CTSIB condition with eyes closed/foam surface in order to improve safety in environments with uneven surfaces and dim lighting. Baseline: NT Goal status: INITIAL  Patient to score at least 20/24 on DGI in order to decrease risk of falls. Baseline: NT Goal status: INITIAL  Patient to score at least 57 on FOTO in order to indicate improved functional outcomes.  Baseline: 45 Goal status: INITIAL   ASSESSMENT:  CLINICAL IMPRESSION:   Patient is a 68 y/o F presenting to OPPT with c/o dizziness for the past year without known cause. Reports pain and tinnitus in R ear. Denies head trauma, infection/illness, vision changes/double vision, hearing loss. Reports sinus HA's "which sometimes turn into migraines." Patient today presenting with Slight coordination impairment, guarded posture, unsteadiness with gait, saccades with inferior smooth pursuit, hypometric saccades,  convergence insufficiency, and motion sensitivity. As patient has sx for ~ 1 year and brain imaging in September 2024 was negative for acute intracranial abnormality, will treat with PT and refer back to MD if she does not respond. Patient was educated on gentle habituation HEP and reported understanding. Would benefit from skilled PT services 1x/week for 6 weeks to address aforementioned impairments in order to optimize level of function.    OBJECTIVE IMPAIRMENTS: Abnormal gait, decreased activity tolerance, decreased balance, decreased coordination, and dizziness.   ACTIVITY LIMITATIONS: carrying, lifting, bending, standing, squatting, sleeping, stairs, transfers, bed mobility, bathing, dressing, reach over head,  and hygiene/grooming  PARTICIPATION LIMITATIONS: meal prep, cleaning, laundry, driving, shopping, community activity, yard work, and church  PERSONAL FACTORS: Age, Behavior pattern, Past/current experiences, Time since onset of injury/illness/exacerbation, and 3+ comorbidities: Anemia, anxiety, asthma, depression, GERD, HTN, LBBB, pericarditis  are also affecting patient's functional outcome.   REHAB POTENTIAL: Good  CLINICAL DECISION MAKING: Evolving/moderate complexity  EVALUATION COMPLEXITY: Moderate   PLAN:  PT FREQUENCY: 1x/week  PT DURATION: 6 weeks  PLANNED INTERVENTIONS: 97164- PT Re-evaluation, 97110-Therapeutic exercises, 97530- Therapeutic activity, 97112- Neuromuscular re-education, 97535- Self Care, 40981- Manual therapy, [phone]- Gait training, [phone]- Canalith repositioning, Patient/Family education, Balance training, Stair training, Taping, Dry Needling, Joint mobilization, Spinal mobilization, Vestibular training, Cryotherapy, and Moist heat  PLAN FOR NEXT SESSION: check orthostatics, MCTSIB, DGI; review and progress HEP   Referring diagnosis? R42 (ICD-10-CM) - Dizziness and giddiness Treatment diagnosis? (if different than referring diagnosis)  Dizziness and  giddiness Unsteadiness on feet  What was this (referring dx) caused by? []  Surgery []  Fall []  Ongoing issue []  Arthritis [x]  Other: _insidious___________  Laterality: []  Rt []  Lt [x]  Both  Check all possible CPT codes:  *CHOOSE 10 OR LESS*    See Planned Interventions listed in the Plan section of the Evaluation.     Baldemar Friday, PT, DPT 03/14/23 2:11 PM  [facility] at [facility] [address] [place], [address] [place], [place] [address] Phone # [phone] Fax # [phone]

## 2023-03-14 ENCOUNTER — Other Ambulatory Visit: Payer: Self-pay

## 2023-03-14 ENCOUNTER — Ambulatory Visit: Payer: Medicare PPO | Attending: Family Medicine | Admitting: Physical Therapy

## 2023-03-14 ENCOUNTER — Encounter: Payer: Self-pay | Admitting: Physical Therapy

## 2023-03-14 DIAGNOSIS — R42 Dizziness and giddiness: Secondary | ICD-10-CM | POA: Insufficient documentation

## 2023-03-14 DIAGNOSIS — R2681 Unsteadiness on feet: Secondary | ICD-10-CM | POA: Diagnosis not present

## 2023-03-15 ENCOUNTER — Ambulatory Visit (INDEPENDENT_AMBULATORY_CARE_PROVIDER_SITE_OTHER): Payer: Medicare PPO | Admitting: Neurology

## 2023-03-15 DIAGNOSIS — G4733 Obstructive sleep apnea (adult) (pediatric): Secondary | ICD-10-CM | POA: Diagnosis not present

## 2023-03-15 DIAGNOSIS — R0683 Snoring: Secondary | ICD-10-CM

## 2023-03-15 NOTE — Telephone Encounter (Signed)
Ignore previous message. She picked up another HST today 03/15/23.

## 2023-03-15 NOTE — Telephone Encounter (Signed)
The data was inconclusive on the WatchPat HST. Appears that the finger probe did not pick up data.  Miranda Berger will be calling patient tomorrow to schedule another appt with patient, to repeat study.

## 2023-03-16 NOTE — Telephone Encounter (Signed)
Noted thanks °

## 2023-03-16 NOTE — Progress Notes (Signed)
See procedure note.

## 2023-03-17 DIAGNOSIS — F331 Major depressive disorder, recurrent, moderate: Secondary | ICD-10-CM | POA: Diagnosis not present

## 2023-03-17 DIAGNOSIS — R5383 Other fatigue: Secondary | ICD-10-CM | POA: Diagnosis not present

## 2023-03-17 DIAGNOSIS — R059 Cough, unspecified: Secondary | ICD-10-CM | POA: Diagnosis not present

## 2023-03-17 DIAGNOSIS — J45909 Unspecified asthma, uncomplicated: Secondary | ICD-10-CM | POA: Diagnosis not present

## 2023-03-17 NOTE — Therapy (Signed)
OUTPATIENT PHYSICAL THERAPY VESTIBULAR TREATMENT     Patient Name: Miranda Berger MRN: [id number] DOB:1955-08-05, 68 y.o., female Today's Date: 03/22/2023  END OF SESSION:  PT End of Session - 03/22/23 1401     Visit Number 2    Number of Visits 7    Date for PT Re-Evaluation 04/25/23    Authorization Type [employer]    Authorization Time Period approved 8 PT visits from 03/14/2023 - 04/25/2023    Authorization - Visit Number 2    Authorization - Number of Visits 8    PT Start Time 1321    PT Stop Time 1402    PT Time Calculation (min) 41 min    Equipment Utilized During Treatment Gait belt    Activity Tolerance Patient tolerated treatment well    Behavior During Therapy [facility] for tasks assessed/performed              Past Medical History:  Diagnosis Date   Abdominal pain 06/22/2012   Anemia    in the past    Anxiety    Aortic stenosis 06/20/2017   Asthma    Chronic interstitial cystitis 06/22/2012   Costochondral chest pain 04/04/2018   Degenerative tear of medial meniscus of right knee 04/21/2016   Depression    Difficult or painful urination 06/22/2012   Dyspareunia 06/22/2012   Family history of adverse reaction to anesthesia    mom had nausea   Female pelvic pain 06/22/2012   GERD (gastroesophageal reflux disease)    Hepatitis    Hepatitis A in 3rd grade?   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   Obstructive sleep apnea    Pericarditis 09/12/2017   PONV (postoperative nausea and vomiting)    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 07/26/2017   [employer], size 23   Urinary urgency 06/22/2012   Past Surgical History:   Procedure Laterality Date   ABDOMINAL HYSTERECTOMY     AORTIC VALVE REPLACEMENT N/A 07/26/2017   Procedure: MINIMALLY INVASIVE AORTIC VALVE REPLACEMENT (AVR);  Surgeon: Purcell Nails, MD;  Location: [facility] OR;  Service: Open Heart Surgery;  Laterality: N/A;   BLADDER SURGERY  2011   bone spur removal Right 08/2022   great toe   CARDIAC CATHETERIZATION     CARPAL TUNNEL RELEASE  2012   CHOLECYSTECTOMY     RIGHT/LEFT HEART CATH AND CORONARY ANGIOGRAPHY N/A 07/08/2017   Procedure: RIGHT/LEFT HEART CATH AND CORONARY ANGIOGRAPHY;  Surgeon: Tonny Bollman, MD;  Location: [facility] INVASIVE CV LAB;  Service: Cardiovascular;  Laterality: N/A;   TEE WITHOUT CARDIOVERSION N/A 07/26/2017   Procedure: TRANSESOPHAGEAL ECHOCARDIOGRAM (TEE);  Surgeon: Purcell Nails, MD;  Location: [facility] OR;  Service: Open Heart Surgery;  Laterality: N/A;   TONSILLECTOMY     Patient Active Problem List   Diagnosis Date Noted   Depression 12/01/2021   Family history of adverse reaction to anesthesia 12/01/2021   GERD (gastroesophageal reflux disease) 12/01/2021   Hepatitis 12/01/2021   PONV (postoperative nausea and vomiting) 12/01/2021   History of hypertension 12/01/2021   LBBB (left bundle branch block) 08/08/2017   Obstructive sleep apnea    Anxiety    Asthma    S/P minimally invasive aortic valve replacement with bioprosthetic  valve 07/26/2017   Aortic stenosis 06/20/2017   Degenerative tear of medial meniscus of right knee 04/21/2016   High-tone pelvic floor dysfunction 12/26/2013   Dyspareunia 06/22/2012    PCP: Farris Has, MD REFERRING PROVIDER: Farris Has, MD   REFERRING DIAG: R42 (ICD-10-CM) - Dizziness and giddiness  THERAPY DIAG:  Dizziness and giddiness  Unsteadiness on feet  ONSET DATE: 1 year  Rationale for Evaluation and Treatment: Rehabilitation  SUBJECTIVE:   SUBJECTIVE STATEMENT: Still trying to get over Laryngitis.    Pt accompanied by: self  PERTINENT HISTORY: Anemia,  anxiety, asthma, depression, GERD, HTN, LBBB, pericarditis  PAIN:  Are you having pain? No  PRECAUTIONS: None  RED FLAGS: None   WEIGHT BEARING RESTRICTIONS: No  FALLS: Has patient fallen in last 6 months? No  LIVING ENVIRONMENT: Lives with: lives alone Lives in: House/apartment Stairs:  3-4 steps to enter; 1 story home  Has following equipment at home: None  PLOF: Independent; retired; likes reading, sewing, crafts   PATIENT GOALS: improve dizziness   OBJECTIVE:       TODAY'S TREATMENT: 03/22/23     Orthostatic Testing   Supine Sitting Standing  x1 Minute Standing x 3 Minutes  BP 115/83 119/80 155/75 117/85  HR 82  89 87 90  *no dizziness during transitions    M-CTSIB  Condition 1: Firm Surface, EO 30 Sec, Normal Sway  Condition 2: Firm Surface, EC 30 Sec, Mild Sway  Condition 3: Foam Surface, EO 30 Sec, Mild and Moderate Sway  Condition 4: Foam Surface, EC 30 Sec, Mild and Moderate Sway     Activity Comments  DGI  22/24  brandt daroff 3x Performed slow, quick, quick with EC.      HOME EXERCISE PROGRAM Last updated: 03/22/23 Access Code: [email or web address] URL: [email or web address] Date: 03/22/2023 Prepared by: [facility] - Outpatient  Rehab - [facility]  Exercises - Seated Horizontal Smooth Pursuit  - 1 x daily - 5 x weekly - 2 sets - 10 reps - Seated Vertical Smooth Pursuit  - 1 x daily - 5 x weekly - 2 sets - 10 reps  PATIENT EDUCATION: Education details: edu on today's test findings, removed habituation from HEP Person educated: Patient Education method: Explanation Education comprehension: verbalized understanding    Note: Objective measures were completed at Evaluation unless otherwise noted.  DIAGNOSTIC FINDINGS: 11/19/22 brain/AIC MRI:  No evidence of an acute intracranial abnormality.Mild multifocal T2 FLAIR hyperintense signal abnormality within the cerebral white matter, nonspecific but most often secondary to chronic  small vessel ischemia. 5. There are a few chronic microhemorrhages scattered within the supratentorial brain and cerebellum. 6. Partially empty sella turcica. This finding can reflect incidental anatomic variation, or alternatively, it can be associated with chronic idiopathic intracranial hypertension   COGNITION: Overall cognitive status: Within functional limits for tasks assessed   SENSATION: Pt reports occasional UE N/T depending on sleeping position   COORDINATION:  Alternating pronation/supination: slow B Alternating toe tap: WNL B Finger to nose: WNL   POSTURE:  rounded shoulders, slightly forward head, guarded  GAIT: Gait pattern: Slightly unsteady, R-ward lean Assistive device utilized: None Level of assistance: Modified independence    PATIENT SURVEYS:  FOTO [phone]  VESTIBULAR ASSESSMENT:  GENERAL OBSERVATION: pt wears readers   OCULOMOTOR EXAM:  Ocular Alignment: normal  Ocular ROM: No Limitations  Spontaneous Nystagmus: absent  Gaze-Induced Nystagmus: absent  Smooth Pursuits: saccades ~3 inferiorly; c/o hard to focus/woozy  Saccades: hypometric/undershoots slightly inferiorly   Convergence/Divergence: ~6 inches d/t  L eye   VESTIBULAR - OCULAR REFLEX:   Slow VOR: Comment: small movements, especially reduced to R; no sx  VOR Cancellation: Normal  Head-Impulse Test: HIT Right: negative HIT Left: negative      POSITIONAL TESTING:  Right Roll Test: negative Left Roll Test: negative   Right Sidelying: negative; reports feeling "off" upon lying down and sitting up Left Sidelying: negative; reports feeling "off" upon lying down and sitting up but less intense   Right Dix-Hallpike: negative; reports feeling "off" upon lying down and sitting up; worse on this side  Left Dix-Hallpike:negative; reports feeling "off" upon lying down and sitting up                                                                                                                             TREATMENT DATE: 03/14/23   PATIENT EDUCATION: Education details: prognosis, POC, HEP Person educated: Patient Education method: Explanation, Demonstration, Tactile cues, Verbal cues, and Handouts Education comprehension: verbalized understanding and returned demonstration  HOME EXERCISE PROGRAM: Access Code: [email or web address] URL: [email or web address] Date: 03/14/2023 Prepared by: [facility] - Outpatient  Rehab - [facility]  Exercises - Brandt-Daroff Vestibular Exercise  - 1 x daily - 5 x weekly - 2 sets - 10 reps - 3-5 hold - Seated Horizontal Smooth Pursuit  - 1 x daily - 5 x weekly - 2 sets - 10 reps - Seated Vertical Smooth Pursuit  - 1 x daily - 5 x weekly - 2 sets - 10 reps   GOALS: Goals reviewed with patient? Yes  SHORT TERM GOALS: Target date: 04/04/2023  Patient to be independent with initial HEP. Baseline: HEP initiated Goal status: IN PROGRESS    LONG TERM GOALS: Target date: 04/25/2023  Patient to be independent with advanced HEP. Baseline: Not yet initiated  Goal status: IN PROGRESS  Patient to report 0/10 dizziness with quick head movements. Baseline: symptomatic  Goal status: IN PROGRESS  Patient will report 0/10 dizziness with bed mobility.  Baseline: Symptomatic  Goal status: IN PROGRESS  Patient to demonstrate mild-moderate sway with M-CTSIB condition with eyes closed/foam surface in order to improve safety in environments with uneven surfaces and dim lighting. Baseline: mild-moderate 03/22/23  Goal status: MET 03/22/23   Patient to score at least 20/24 on DGI in order to decrease risk of falls. Baseline: 22/24 03/21/22 Goal status: MET 03/22/23  Patient to score at least 57 on FOTO in order to indicate improved functional outcomes.  Baseline: 45 Goal status: IN PROGRESS   ASSESSMENT:  CLINICAL IMPRESSION:  Patient arrived to session without complaints. Orthostatic testing was negative. Multisensory balance testing  revealed mild-moderate sway- this goal was met. Patient scored 22/24 on DGI, indicating decreased risk of falls. HEP will need to be further updated/progressed to simulate patient's symptoms. No complaints at end of session.   OBJECTIVE IMPAIRMENTS: Abnormal gait, decreased activity tolerance, decreased balance, decreased coordination, and dizziness.   ACTIVITY LIMITATIONS: carrying, lifting, bending, standing,  squatting, sleeping, stairs, transfers, bed mobility, bathing, dressing, reach over head, and hygiene/grooming  PARTICIPATION LIMITATIONS: meal prep, cleaning, laundry, driving, shopping, community activity, yard work, and church  PERSONAL FACTORS: Age, Behavior pattern, Past/current experiences, Time since onset of injury/illness/exacerbation, and 3+ comorbidities: Anemia, anxiety, asthma, depression, GERD, HTN, LBBB, pericarditis  are also affecting patient's functional outcome.   REHAB POTENTIAL: Good  CLINICAL DECISION MAKING: Evolving/moderate complexity  EVALUATION COMPLEXITY: Moderate   PLAN:  PT FREQUENCY: 1x/week  PT DURATION: 6 weeks  PLANNED INTERVENTIONS: 97164- PT Re-evaluation, 97110-Therapeutic exercises, 97530- Therapeutic activity, 97112- Neuromuscular re-education, 97535- Self Care, 21308- Manual therapy, [phone]- Gait training, [phone]- Canalith repositioning, Patient/Family education, Balance training, Stair training, Taping, Dry Needling, Joint mobilization, Spinal mobilization, Vestibular training, Cryotherapy, and Moist heat  PLAN FOR NEXT SESSION: review and progress HEP    Baldemar Friday, PT, DPT 03/22/23 2:04 PM  [facility] at [facility] [address], [address] [place], [place] [address] Phone # [phone] Fax # [phone]

## 2023-03-18 ENCOUNTER — Telehealth: Payer: Self-pay | Admitting: Podiatry

## 2023-03-18 ENCOUNTER — Ambulatory Visit: Payer: Medicare PPO | Admitting: Podiatry

## 2023-03-18 ENCOUNTER — Encounter: Payer: Self-pay | Admitting: Podiatry

## 2023-03-18 DIAGNOSIS — B351 Tinea unguium: Secondary | ICD-10-CM | POA: Diagnosis not present

## 2023-03-18 DIAGNOSIS — Z79899 Other long term (current) drug therapy: Secondary | ICD-10-CM | POA: Diagnosis not present

## 2023-03-18 MED ORDER — TAVABOROLE 5 % EX SOLN
1.0000 [drp] | CUTANEOUS | 2 refills | Status: DC
Start: 2023-03-18 — End: 2023-10-13

## 2023-03-18 NOTE — Progress Notes (Unsigned)
Yeah I only going to get a new one.  Much improvement I am okay yeah but I am not

## 2023-03-18 NOTE — Patient Instructions (Signed)

## 2023-03-18 NOTE — Telephone Encounter (Signed)
Patient is requesting to speak with Provider or nurse regarding question about medication and treatment.

## 2023-03-19 LAB — HEPATIC FUNCTION PANEL
ALT: 91 [IU]/L — ABNORMAL HIGH (ref 0–32)
AST: 40 [IU]/L (ref 0–40)
Albumin: 4.3 g/dL (ref 3.9–4.9)
Alkaline Phosphatase: 177 [IU]/L — ABNORMAL HIGH (ref 44–121)
Bilirubin Total: 0.2 mg/dL (ref 0.0–1.2)
Bilirubin, Direct: 0.12 mg/dL (ref 0.00–0.40)
Total Protein: 6.4 g/dL (ref 6.0–8.5)

## 2023-03-19 LAB — CBC WITH DIFFERENTIAL/PLATELET
Basophils Absolute: 0 10*3/uL (ref 0.0–0.2)
Basos: 1 %
EOS (ABSOLUTE): 0.2 10*3/uL (ref 0.0–0.4)
Eos: 3 %
Hematocrit: 35.4 % (ref 34.0–46.6)
Hemoglobin: 11.7 g/dL (ref 11.1–15.9)
Immature Grans (Abs): 0 10*3/uL (ref 0.0–0.1)
Immature Granulocytes: 0 %
Lymphocytes Absolute: 1.8 10*3/uL (ref 0.7–3.1)
Lymphs: 41 %
MCH: 30.2 pg (ref 26.6–33.0)
MCHC: 33.1 g/dL (ref 31.5–35.7)
MCV: 91 fL (ref 79–97)
Monocytes Absolute: 0.6 10*3/uL (ref 0.1–0.9)
Monocytes: 13 %
Neutrophils Absolute: 1.8 10*3/uL (ref 1.4–7.0)
Neutrophils: 42 %
Platelets: 209 10*3/uL (ref 150–450)
RBC: 3.88 x10E6/uL (ref 3.77–5.28)
RDW: 12.6 % (ref 11.7–15.4)
WBC: 4.4 10*3/uL (ref 3.4–10.8)

## 2023-03-21 ENCOUNTER — Encounter: Payer: Self-pay | Admitting: Podiatry

## 2023-03-21 NOTE — Telephone Encounter (Signed)
Pt would like to speak with someone regarding her HST also states her machine is still broken and hasn't been sleeping or feeling well. States she feels as if she's getting the run around. Requesting call back to discuss

## 2023-03-21 NOTE — Telephone Encounter (Signed)
Pt had HST (picked up) 03-15-2023 done to be read.

## 2023-03-21 NOTE — Procedures (Signed)
Miranda Berger  HOME SLEEP TEST (Watch PAT) REPORT  STUDY DATE: 03/15/2023 (previous attempted HST in December 2024)  DOB: 30-Jul-1955  MRN: [id number]  ORDERING CLINICIAN: Huston Foley, MD, PhD   REFERRING CLINICIAN: Butch Penny, NP  CLINICAL INFORMATION/HISTORY: 68 year old female with an underlying medical history of anemia, aortic stenosis, asthma, anxiety, depression, reflux disease, hypertension, history of pericarditis, dizziness, and overweight state, who presents for reevaluation of her obstructive sleep apnea.  She reports prior compliance with her old CPAP machine but since it broke a few months ago she has not used it. She has had interim weight loss since her original sleep apnea diagnosis in 2016.  An attempted home sleep test in December 2024 did not show conclusive data.  Epworth sleepiness score: 2/24.  BMI: 28.2 kg/m  FINDINGS:   Sleep Summary:   Total Recording Time (hours, min): 8 hours, 53 min  Total Sleep Time (hours, min):  7 hours, 53 min  Percent REM (%):    13.9%   Respiratory Indices:   Calculated pAHI (per hour):  4.0/hour (1.2/h, utilizing the 4% desaturation criteria for hypopneas per Medicare guidelines)         REM pAHI:    4.4/hour       NREM pAHI: 4.0/hour  Central pAHI: 0.3/hour  Oxygen Saturation Statistics:    Oxygen Saturation (%) Mean: 94%   Minimum oxygen saturation (%):                 78%   O2 Saturation Range (%): 78-97%    O2 Saturation (minutes) <=88%: 0.1 min  Pulse Rate Statistics:   Pulse Mean (bpm):    82/min    Pulse Range (68- 115/min)   IMPRESSION: Primary snoring   RECOMMENDATION:  This home sleep test does not demonstrate any significant obstructive or central sleep disordered breathing with a total AHI of less than 5/hour.  Her total AHI - by Medicare guidelines - was 1.2/h, utilizing the 4% desaturation criteria for obstructive hypopneas.  Her average oxygen saturation was 94%,  nadir was 78% without any significant time below or at 88% saturation for the night, less than 1 minute.  Snoring was detected and was intermittent in the mild to moderate range, at times louder.  Treatment with a positive airway pressure device is not indicated based on the current test results.  If the patient has ongoing sleep related complaints and concern for underlying ongoing obstructive sleep apnea, a follow-up appointment in sleep clinic is recommended to discuss the possibility for an in lab sleep study to reevaluate her sleep disordered breathing.  Compared to her original sleep study in 2016, the patient has lost a significant amount of weight which likely contributed to the improvement of her sleep apnea.  For disturbing snoring, an oral appliance through dentistry or orthodontics can be considered.  Other causes of the patient's symptoms, including circadian rhythm disturbances, an underlying mood disorder, medication effect and/or an underlying medical problem cannot be ruled out based on this test. Clinical correlation is recommended.  The patient should be cautioned not to drive, work at heights, or operate dangerous or heavy equipment when tired or sleepy. Review and reiteration of good sleep hygiene measures should be pursued with any patient. The patient will be advised to follow up with her referring provider, who will be notified of the test results.   I certify that I have reviewed the raw data recording prior to the issuance of this report in accordance with  the standards of the American Academy of Sleep Medicine (AASM).  INTERPRETING PHYSICIAN:   Huston Foley, MD, PhD Medical Director, [facility]) Diplomat, ABPN (Neurology and Sleep)   [facility] [address] [place], [place] [address] [phone]

## 2023-03-21 NOTE — Telephone Encounter (Signed)
Patient is not sure of which medications she is required to take, pills or drops or both. Patient is requesting to speak with nurse.

## 2023-03-22 ENCOUNTER — Encounter: Payer: Self-pay | Admitting: Physical Therapy

## 2023-03-22 ENCOUNTER — Telehealth: Payer: Self-pay | Admitting: *Deleted

## 2023-03-22 ENCOUNTER — Ambulatory Visit: Payer: Medicare PPO | Admitting: Physical Therapy

## 2023-03-22 DIAGNOSIS — R2681 Unsteadiness on feet: Secondary | ICD-10-CM

## 2023-03-22 DIAGNOSIS — R42 Dizziness and giddiness: Secondary | ICD-10-CM

## 2023-03-22 DIAGNOSIS — G4733 Obstructive sleep apnea (adult) (pediatric): Secondary | ICD-10-CM

## 2023-03-22 NOTE — Telephone Encounter (Signed)
See other phone note.  Cpap discontinued.  Pt aware.

## 2023-03-22 NOTE — Telephone Encounter (Signed)
I called pt, after getting message back from [place] in Sleep Lab that HST was read.  I was following up on the message yesterday.  Reviewing chart looks like we have spoken to pt and followed thru on what she asked for previously.  She appreciated call back. I told her the HST report in to be resulted by MM/NP and will get back to her with that and then about her machine (which she says is broken).

## 2023-03-22 NOTE — Telephone Encounter (Signed)
I called pt and let her know that her sleep study did not show any significant apnea events, therefore she no longer needs to be treated with CPAP. The patient was very appreciative. She agrees to discontinue use. Her machine was broken anyway. I told her I would send an order to [facility]. She can contact them to see if they can take machines back that are broken or how she should dispose of it. The pt's questions were answered. If she has any further concerns or if her snoring starts to bother her again, she was advised to call us back.   Cpap discontinuation order written.

## 2023-03-22 NOTE — Telephone Encounter (Signed)
-----   Message from [facility] sent at 03/22/2023 12:58 PM EST ----- Please call patient and advise that her HST did not show any significant apnea events.  Therefore she no longer needs to be treated with CPAP.

## 2023-03-23 NOTE — Progress Notes (Signed)
Subjective:   Patient ID: Miranda Berger, female   DOB: 68 y.o.   MRN: [id number]   HPI Chief Complaint  Patient presents with   Nail Problem    Toenails - mainly 1st and 2nd bilateral, 5th left - thick, discolored x 4-5 years, been seeing dermatology- gave drops and sent for bloodwork, seems worse than what it was initially, asked dermatologist about other treatments, but felt like she got the run around-she again put her on the same Rx drops again, very frustrated, going on a cruise soon   New Patient (Initial Visit)   This is a-year-old female presents the office today with above concerns.  She states that the on this for quite some time and has been seen by dermatology.  She been using topical medication of the toenails. She is frustrated with her nails.  No swelling, redness or drainage.  No other concerns.   ROS  Past Medical History:  Diagnosis Date   Abdominal pain 06/22/2012   Anemia    in the past    Anxiety    Aortic stenosis 06/20/2017   Asthma    Chronic interstitial cystitis 06/22/2012   Costochondral chest pain 04/04/2018   Degenerative tear of medial meniscus of right knee 04/21/2016   Depression    Difficult or painful urination 06/22/2012   Dyspareunia 06/22/2012   Family history of adverse reaction to anesthesia    mom had nausea   Female pelvic pain 06/22/2012   GERD (gastroesophageal reflux disease)    Hepatitis    Hepatitis A in 3rd grade?   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   Obstructive sleep apnea    Pericarditis 09/12/2017   PONV (postoperative nausea and vomiting)    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 07/26/2017   [place]  [employer], size 23   Urinary urgency 06/22/2012    Past Surgical History:  Procedure Laterality Date   ABDOMINAL HYSTERECTOMY     AORTIC VALVE REPLACEMENT N/A 07/26/2017   Procedure: MINIMALLY INVASIVE AORTIC VALVE REPLACEMENT (AVR);  Surgeon: Purcell Nails, MD;  Location: [facility] OR;  Service: Open Heart Surgery;  Laterality: N/A;   BLADDER SURGERY  2011   bone spur removal Right 08/2022   great toe   CARDIAC CATHETERIZATION     CARPAL TUNNEL RELEASE  2012   CHOLECYSTECTOMY     RIGHT/LEFT HEART CATH AND CORONARY ANGIOGRAPHY N/A 07/08/2017   Procedure: RIGHT/LEFT HEART CATH AND CORONARY ANGIOGRAPHY;  Surgeon: Tonny Bollman, MD;  [facility] INVASIVE CV LAB;  Service: Cardiovascular;  Laterality: N/A;   TEE WITHOUT CARDIOVERSION N/A 07/26/2017   Procedure: TRANSESOPHAGEAL ECHOCARDIOGRAM (TEE);  Surgeon: Purcell Nails, MD;  Location: [facility] OR;  Service: Open Heart Surgery;  Laterality: N/A;   TONSILLECTOMY       Current Outpatient Medications:    Tavaborole (KERYDIN) 5 % SOLN, Apply 1 drop topically 1 day or 1 dose. Apply 1 drop to the toenail daily., Disp: 10 mL, Rfl: 2   albuterol (PROVENTIL HFA;VENTOLIN HFA) 108 (90 Base) MCG/ACT inhaler, Inhale 2 puffs into the lungs every 6 (six) hours as needed for wheezing or shortness of breath., Disp: , Rfl:    ALPRAZolam (XANAX) 0.5 MG tablet, Take 0.25 mg by mouth  in the morning and at bedtime., Disp: , Rfl:    aspirin EC 81 MG tablet, Take 81 mg by mouth daily., Disp: , Rfl:    buPROPion (WELLBUTRIN XL) 300 MG 24 hr tablet, Take 1 tablet (300 mg total) by mouth daily., Disp: 30 tablet, Rfl: 0   calcium carbonate (OS-CAL) 600 MG TABS tablet, Take 600 mg by mouth daily. , Disp: , Rfl:    cetirizine (ZYRTEC) 5 MG tablet, Take 5 mg by mouth daily., Disp: , Rfl:    fluticasone (FLONASE) 50 MCG/ACT nasal spray, Place 1 spray into both nostrils daily as needed for allergies. , Disp: , Rfl:    Fluticasone-Salmeterol (ADVAIR) 250-50 MCG/DOSE  AEPB, Inhale 1 puff into the lungs 2 (two) times daily., Disp: , Rfl:    mirtazapine (REMERON) 15 MG tablet, Take 7.5 mg by mouth at bedtime. , Disp: , Rfl:    Multiple Vitamin (MULTIVITAMIN) capsule, Take 1 capsule by mouth daily., Disp: , Rfl:   Allergies  Allergen Reactions   Codeine Itching   Prednisone Itching          Objective:  Physical Exam  General: AAO x3, NAD  Dermatological: Nails are hypertrophic, dystrophic with yellow discoloration.  There is no hyperpigmentation.  No edema, erythema.  No open lesions.  Vascular: Dorsalis Pedis artery and Posterior Tibial artery pedal pulses are 2/4 bilateral with immedate capillary fill time.  There is no pain with calf compression, swelling, warmth, erythema.   Neruologic: Grossly intact via light touch bilateral.   Musculoskeletal: No areas of discomfort today.  Gait: Unassisted, Nonantalgic.       Assessment:   Onychomycosis     Plan:  -Treatment options discussed including all alternatives, risks, and complications -Etiology of symptoms were discussed -We discussed treatment options medication, topical medication as well as alternative treatments.  On combined oral and topical after discussing risks, side effects and success rates.  Will check a CBC, LFT prior to starting the Lamisil.  Prescribed Jublia.  Also consider adding laser therapy if needed.  Vivi Barrack DPM

## 2023-03-24 ENCOUNTER — Telehealth: Payer: Self-pay

## 2023-03-24 NOTE — Telephone Encounter (Signed)
Lab results faxed to PCP, Dr. Kateri Plummer at [facility]. Called and spoke to patient at length. She has an appointment with Dr. Kateri Plummer this week

## 2023-03-24 NOTE — Telephone Encounter (Signed)
-----   Message from Vivi Barrack sent at 03/21/2023  5:34 PM EST ----- Herbert Seta, can you send the blood work to her PCP? Thanks!

## 2023-03-24 NOTE — Telephone Encounter (Signed)
Coltrane, Tawanna Sat, Otilio Jefferson, RN; [place], [place]; Delia Chimes L We will make a note in her account. She only has CPAP supplies with [place].

## 2023-03-25 DIAGNOSIS — J45909 Unspecified asthma, uncomplicated: Secondary | ICD-10-CM | POA: Diagnosis not present

## 2023-03-25 DIAGNOSIS — F418 Other specified anxiety disorders: Secondary | ICD-10-CM | POA: Diagnosis not present

## 2023-03-25 DIAGNOSIS — R748 Abnormal levels of other serum enzymes: Secondary | ICD-10-CM | POA: Diagnosis not present

## 2023-03-25 LAB — COMPREHENSIVE METABOLIC PANEL
Calcium: 9.4
EGFR: 78

## 2023-03-28 ENCOUNTER — Telehealth: Payer: Self-pay | Admitting: Podiatry

## 2023-03-28 NOTE — Telephone Encounter (Signed)
Patient needs to know the name of the oral medication Dr. Ardelle Anton prescribed due to blood work.

## 2023-03-29 ENCOUNTER — Ambulatory Visit: Payer: Medicare PPO | Admitting: Physical Therapy

## 2023-03-30 NOTE — Therapy (Signed)
 OUTPATIENT PHYSICAL THERAPY VESTIBULAR TREATMENT     Patient Name: Miranda Berger MRN: [id number] DOB:[birth year], 68 y.o., female Today's Date: 03/31/2023  END OF SESSION:  PT End of Session - 03/31/23 1232     Visit Number 3    Number of Visits 7    Date for PT Re-Evaluation 04/25/23    Authorization Type [employer]    Authorization Time Period approved 8 PT visits from 03/14/2023 - 04/25/2023    Authorization - Visit Number 3    Authorization - Number of Visits 8    PT Start Time 1150    PT Stop Time 1232    PT Time Calculation (min) 42 min    Activity Tolerance Patient tolerated treatment well    Behavior During Therapy [facility] for tasks assessed/performed               Past Medical History:  Diagnosis Date   Abdominal pain 06/22/2012   Anemia    in the past    Anxiety    Aortic stenosis 06/20/2017   Asthma    Chronic interstitial cystitis 06/22/2012   Costochondral chest pain 04/04/2018   Degenerative tear of medial meniscus of right knee 04/21/2016   Depression    Difficult or painful urination 06/22/2012   Dyspareunia 06/22/2012   Family history of adverse reaction to anesthesia    mom had nausea   Female pelvic pain 06/22/2012   GERD (gastroesophageal reflux disease)    Hepatitis    Hepatitis A in 3rd grade?   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   Obstructive sleep apnea    Pericarditis 09/12/2017   PONV (postoperative nausea and vomiting)    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 07/26/2017   [employer], size 23   Urinary urgency 06/22/2012   Past Surgical History:  Procedure Laterality Date   ABDOMINAL HYSTERECTOMY      AORTIC VALVE REPLACEMENT N/A 07/26/2017   Procedure: MINIMALLY INVASIVE AORTIC VALVE REPLACEMENT (AVR);  Surgeon: Dusty Sudie DEL, MD;  Location: [facility] OR;  Service: Open Heart Surgery;  Laterality: N/A;   BLADDER SURGERY  2011   bone spur removal Right 08/2022   great toe   CARDIAC CATHETERIZATION     CARPAL TUNNEL RELEASE  2012   CHOLECYSTECTOMY     RIGHT/LEFT HEART CATH AND CORONARY ANGIOGRAPHY N/A 07/08/2017   Procedure: RIGHT/LEFT HEART CATH AND CORONARY ANGIOGRAPHY;  Surgeon: Wonda Sharper, MD;  Location[facility] INVASIVE CV LAB;  Service: Cardiovascular;  Laterality: N/A;   TEE WITHOUT CARDIOVERSION N/A 07/26/2017   Procedure: TRANSESOPHAGEAL ECHOCARDIOGRAM (TEE);  Surgeon: Dusty Sudie DEL, MD;  Location: [facility] OR;  Service: Open Heart Surgery;  Laterality: N/A;   TONSILLECTOMY     Patient Active Problem List   Diagnosis Date Noted   Depression 12/01/2021   Family history of adverse reaction to anesthesia 12/01/2021   GERD (gastroesophageal reflux disease) 12/01/2021   Hepatitis 12/01/2021   PONV (postoperative nausea and vomiting) 12/01/2021   History of hypertension 12/01/2021   LBBB (left bundle branch block) 08/08/2017   Obstructive sleep apnea    Anxiety    Asthma    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 07/26/2017   Aortic stenosis 06/20/2017  Degenerative tear of medial meniscus of right knee 04/21/2016   High-tone pelvic floor dysfunction 12/26/2013   Dyspareunia 06/22/2012    PCP: Kip Righter, MD REFERRING PROVIDER: Kip Righter, MD   REFERRING DIAG: R42 (ICD-10-CM) - Dizziness and giddiness  THERAPY DIAG:  Dizziness and giddiness  Unsteadiness on feet  ONSET DATE: 1 year  Rationale for Evaluation and Treatment: Rehabilitation  SUBJECTIVE:   SUBJECTIVE STATEMENT: Only had a time or maybe 2 when she got dizzy. Noticing it with quick movements. Apologized for being late- coming from getting a pedicure.    Pt  accompanied by: self  PERTINENT HISTORY: Anemia, anxiety, asthma, depression, GERD, HTN, LBBB, pericarditis  PAIN:  Are you having pain? No  PRECAUTIONS: None  RED FLAGS: None   WEIGHT BEARING RESTRICTIONS: No  FALLS: Has patient fallen in last 6 months? No  LIVING ENVIRONMENT: Lives with: lives alone Lives in: House/apartment Stairs:  3-4 steps to enter; 1 story home  Has following equipment at home: None  PLOF: Independent; retired; likes reading, sewing, crafts   PATIENT GOALS: improve dizziness   OBJECTIVE:    TODAY'S TREATMENT: 03/31/23 Activity Comments  romberg EC on foam 2x30 C/o a little wozzy but mostly unsteady; mild sway   wall bumps hip and shoulder EC/EO on foam Verbal instruction of sequencing; tolerated well   standing head nods to targets 30  Reported no dizziness but c/o anxiety and fearfulness  Sitting head nods to targets 2x30 Cueing to increase pace; patient still very guarded and no c/o dizziness   Sitting head turns to targets 2x30 Compensatory blinking to avoid visual input; c/o some mild symptoms   fwd/back stepping + head nods Performed with LE movement only, then added head nods; required CGA and 1 UE support d/t guarding/instability; c/o some mild wooziness        PATIENT EDUCATION: Education details: discussed pt's fear avoidance activities and how to habituate movements to return to daily activities; answered pt's questions about medications that are used for dizziness,HEP update Person educated: Patient Education method: Explanation, Demonstration, Tactile cues, Verbal cues, and Handouts Education comprehension: verbalized understanding and returned demonstration    HOME EXERCISE PROGRAM Last updated: 03/22/23 Access Code: [email or web address] URL: [email or web address] Date: 03/22/2023 Prepared by: [facility] - Outpatient  Rehab - [facility]  Exercises - Seated Horizontal Smooth Pursuit  - 1 x daily - 5 x weekly - 2  sets - 10 reps - Seated Vertical Smooth Pursuit  - 1 x daily - 5 x weekly - 2 sets - 10 reps   Note: Objective measures were completed at Evaluation unless otherwise noted.  DIAGNOSTIC FINDINGS: 11/19/22 brain/AIC MRI:  No evidence of an acute intracranial abnormality.Mild multifocal T2 FLAIR hyperintense signal abnormality within the cerebral white matter, nonspecific but most often secondary to chronic small vessel ischemia. 5. There are a few chronic microhemorrhages scattered within the supratentorial brain and cerebellum. 6. Partially empty sella turcica. This finding can reflect incidental anatomic variation, or alternatively, it can be associated with chronic idiopathic intracranial hypertension   COGNITION: Overall cognitive status: Within functional limits for tasks assessed   SENSATION: Pt reports occasional UE N/T depending on sleeping position   COORDINATION:  Alternating pronation/supination: slow B Alternating toe tap: WNL B Finger to nose: WNL   POSTURE:  rounded shoulders, slightly forward head, guarded  GAIT: Gait pattern: Slightly unsteady, R-ward lean Assistive device utilized: None Level of assistance: Modified independence    PATIENT SURVEYS:  FOTO [phone]  VESTIBULAR ASSESSMENT:  GENERAL OBSERVATION: pt wears readers   OCULOMOTOR EXAM:  Ocular Alignment: normal  Ocular ROM: No Limitations  Spontaneous Nystagmus: absent  Gaze-Induced Nystagmus: absent  Smooth Pursuits: saccades ~3 inferiorly; c/o hard to focus/woozy  Saccades: hypometric/undershoots slightly inferiorly   Convergence/Divergence: ~6 inches d/t L eye   VESTIBULAR - OCULAR REFLEX:   Slow VOR: Comment: small movements, especially reduced to R; no sx  VOR Cancellation: Normal  Head-Impulse Test: HIT Right: negative HIT Left: negative      POSITIONAL TESTING:  Right Roll Test: negative Left Roll Test: negative   Right Sidelying: negative; reports feeling off upon lying  down and sitting up Left Sidelying: negative; reports feeling off upon lying down and sitting up but less intense   Right Dix-Hallpike: negative; reports feeling off upon lying down and sitting up; worse on this side  Left Dix-Hallpike:negative; reports feeling off upon lying down and sitting up                                                                                                                            TREATMENT DATE: 03/14/23   PATIENT EDUCATION: Education details: prognosis, POC, HEP Person educated: Patient Education method: Explanation, Demonstration, Tactile cues, Verbal cues, and Handouts Education comprehension: verbalized understanding and returned demonstration  HOME EXERCISE PROGRAM: Access Code: [email or web address] URL: [email or web address] Date: 03/14/2023 Prepared by: [facility] - Outpatient  Rehab - [facility]  Exercises - Brandt-Daroff Vestibular Exercise  - 1 x daily - 5 x weekly - 2 sets - 10 reps - 3-5 hold - Seated Horizontal Smooth Pursuit  - 1 x daily - 5 x weekly - 2 sets - 10 reps - Seated Vertical Smooth Pursuit  - 1 x daily - 5 x weekly - 2 sets - 10 reps   GOALS: Goals reviewed with patient? Yes  SHORT TERM GOALS: Target date: 04/04/2023  Patient to be independent with initial HEP. Baseline: HEP initiated Goal status: IN PROGRESS    LONG TERM GOALS: Target date: 04/25/2023  Patient to be independent with advanced HEP. Baseline: Not yet initiated  Goal status: IN PROGRESS  Patient to report 0/10 dizziness with quick head movements. Baseline: symptomatic  Goal status: IN PROGRESS  Patient will report 0/10 dizziness with bed mobility.  Baseline: Symptomatic  Goal status: IN PROGRESS  Patient to demonstrate mild-moderate sway with M-CTSIB condition with eyes closed/foam surface in order to improve safety in environments with uneven surfaces and dim lighting. Baseline: mild-moderate 03/22/23  Goal status: MET 03/22/23    Patient to score at least 20/24 on DGI in order to decrease risk of falls. Baseline: 22/24 03/21/22 Goal status: MET 03/22/23  Patient to score at least 57 on FOTO in order to indicate improved functional outcomes.  Baseline: 45 Goal status: IN PROGRESS   ASSESSMENT:  CLINICAL IMPRESSION: Patient arrived to session with report of only noticing 2 episodes of dizziness since last session, both occurring when moving quickly. Worked on multisensory  balance challenges with patient demonstrating guarding and perception of increased sway despite fairly good stability. Worked on incorporating quick head movements into habituation- patient quite guarded and c/o anxiety in standing, thus working on this in sitting for improved comfort. HEP was updated to continue working on this. Patient tolerated session well and without complaints upon leaving.   OBJECTIVE IMPAIRMENTS: Abnormal gait, decreased activity tolerance, decreased balance, decreased coordination, and dizziness.   ACTIVITY LIMITATIONS: carrying, lifting, bending, standing, squatting, sleeping, stairs, transfers, bed mobility, bathing, dressing, reach over head, and hygiene/grooming  PARTICIPATION LIMITATIONS: meal prep, cleaning, laundry, driving, shopping, community activity, yard work, and church  PERSONAL FACTORS: Age, Behavior pattern, Past/current experiences, Time since onset of injury/illness/exacerbation, and 3+ comorbidities: Anemia, anxiety, asthma, depression, GERD, HTN, LBBB, pericarditis  are also affecting patient's functional outcome.   REHAB POTENTIAL: Good  CLINICAL DECISION MAKING: Evolving/moderate complexity  EVALUATION COMPLEXITY: Moderate   PLAN:  PT FREQUENCY: 1x/week  PT DURATION: 6 weeks  PLANNED INTERVENTIONS: 97164- PT Re-evaluation, 97110-Therapeutic exercises, 97530- Therapeutic activity, 97112- Neuromuscular re-education, 97535- Self Care, [id number]- Manual therapy, [phone]- Gait training, [phone]- Canalith  repositioning, Patient/Family education, Balance training, Stair training, Taping, Dry Needling, Joint mobilization, Spinal mobilization, Vestibular training, Cryotherapy, and Moist heat  PLAN FOR NEXT SESSION: review and progress HEP; continue working on quick head movements, progress to bending, reaching, turns; work on press photographer and reducing fear avoidance behaviors     Louana Terrilyn Christians, [place], DPT 03/31/23 12:33 PM  [facility] Re[facility] at [facility] [address], [address] [place], [place] [address] Phone # [phone] Fax # [phone]

## 2023-03-31 ENCOUNTER — Ambulatory Visit: Payer: Medicare PPO | Attending: Family Medicine | Admitting: Physical Therapy

## 2023-03-31 ENCOUNTER — Encounter: Payer: Self-pay | Admitting: Physical Therapy

## 2023-03-31 DIAGNOSIS — R2681 Unsteadiness on feet: Secondary | ICD-10-CM | POA: Diagnosis not present

## 2023-03-31 DIAGNOSIS — R42 Dizziness and giddiness: Secondary | ICD-10-CM | POA: Diagnosis not present

## 2023-04-04 ENCOUNTER — Other Ambulatory Visit: Payer: Self-pay | Admitting: Podiatry

## 2023-04-04 DIAGNOSIS — Z79899 Other long term (current) drug therapy: Secondary | ICD-10-CM

## 2023-04-04 MED ORDER — TERBINAFINE HCL 250 MG PO TABS: 250.0000 mg | ORAL_TABLET | Freq: Every day | ORAL | 0 refills | Status: DC

## 2023-04-11 NOTE — Therapy (Signed)
OUTPATIENT PHYSICAL THERAPY VESTIBULAR TREATMENT     Patient Name: Miranda Berger MRN: [id number] DOB:12-08-1955, 68 y.o., female Today's Date: 04/12/2023  END OF SESSION:  PT End of Session - 04/12/23 1355     Visit Number 4    Number of Visits 7    Date for PT Re-Evaluation 04/25/23    Authorization Type [employer]    Authorization Time Period approved 8 PT visits from 03/14/2023 - 04/25/2023    Authorization - Visit Number 4    Authorization - Number of Visits 8    PT Start Time 1313    PT Stop Time 1355    PT Time Calculation (min) 42 min    Equipment Utilized During Treatment Gait belt    Activity Tolerance Patient tolerated treatment well    Behavior During Therapy [facility] for tasks assessed/performed                Past Medical History:  Diagnosis Date   Abdominal pain 06/22/2012   Anemia    in the past    Anxiety    Aortic stenosis 06/20/2017   Asthma    Chronic interstitial cystitis 06/22/2012   Costochondral chest pain 04/04/2018   Degenerative tear of medial meniscus of right knee 04/21/2016   Depression    Difficult or painful urination 06/22/2012   Dyspareunia 06/22/2012   Family history of adverse reaction to anesthesia    mom had nausea   Female pelvic pain 06/22/2012   GERD (gastroesophageal reflux disease)    Hepatitis    Hepatitis A in 3rd grade?   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   Obstructive sleep apnea    Pericarditis 09/12/2017   PONV (postoperative nausea and vomiting)    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 07/26/2017   [employer], size 23   Urinary urgency 06/22/2012   Past Surgical History:   Procedure Laterality Date   ABDOMINAL HYSTERECTOMY     AORTIC VALVE REPLACEMENT N/A 07/26/2017   Procedure: MINIMALLY INVASIVE AORTIC VALVE REPLACEMENT (AVR);  Surgeon: Purcell Nails, MD;  Location: [facility] OR;  Service: Open Heart Surgery;  Laterality: N/A;   BLADDER SURGERY  2011   bone spur removal Right 08/2022   great toe   CARDIAC CATHETERIZATION     CARPAL TUNNEL RELEASE  2012   CHOLECYSTECTOMY     RIGHT/LEFT HEART CATH AND CORONARY ANGIOGRAPHY N/A 07/08/2017   Procedure: RIGHT/LEFT HEART CATH AND CORONARY ANGIOGRAPHY;  Surgeon: Tonny Bollman, MD;  Location: [facility] INVASIVE CV LAB;  Service: Cardiovascular;  Laterality: N/A;   TEE WITHOUT CARDIOVERSION N/A 07/26/2017   Procedure: TRANSESOPHAGEAL ECHOCARDIOGRAM (TEE);  Surgeon: Purcell Nails, MD;  Location: [facility] OR;  Service: Open Heart Surgery;  Laterality: N/A;   TONSILLECTOMY     Patient Active Problem List   Diagnosis Date Noted   Depression 12/01/2021   Family history of adverse reaction to anesthesia 12/01/2021   GERD (gastroesophageal reflux disease) 12/01/2021   Hepatitis 12/01/2021   PONV (postoperative nausea and vomiting) 12/01/2021   History of hypertension 12/01/2021   LBBB (left bundle branch block) 08/08/2017   Obstructive sleep apnea    Anxiety    Asthma    S/P minimally invasive aortic valve replacement  with bioprosthetic valve 07/26/2017   Aortic stenosis 06/20/2017   Degenerative tear of medial meniscus of right knee 04/21/2016   High-tone pelvic floor dysfunction 12/26/2013   Dyspareunia 06/22/2012    PCP: Farris Has, MD REFERRING PROVIDER: Farris Has, MD   REFERRING DIAG: R42 (ICD-10-CM) - Dizziness and giddiness  THERAPY DIAG:  Dizziness and giddiness  Unsteadiness on feet  ONSET DATE: 1 year  Rationale for Evaluation and Treatment: Rehabilitation  SUBJECTIVE:   SUBJECTIVE STATEMENT: Had a nice trip. Did not have any experiences with dizziness while on vacation. None since she has  gotten home either.    Pt accompanied by: self  PERTINENT HISTORY: Anemia, anxiety, asthma, depression, GERD, HTN, LBBB, pericarditis  PAIN:  Are you having pain? No  PRECAUTIONS: None  RED FLAGS: None   WEIGHT BEARING RESTRICTIONS: No  FALLS: Has patient fallen in last 6 months? No  LIVING ENVIRONMENT: Lives with: lives alone Lives in: House/apartment Stairs:  3-4 steps to enter; 1 story home  Has following equipment at home: None  PLOF: Independent; retired; likes reading, sewing, crafts   PATIENT GOALS: improve dizziness   OBJECTIVE:     TODAY'S TREATMENT: 04/12/23 Activity Comments  review of HEP update: sitting head nods 30" sitting head turns 30" Report of feeling apprehensive at quicker pace but good quick pace was demonstrated without dizziness   standing head nods 30" Report of mild dizziness upon stopping   standing head turns 30" Report of mild dizziness upon stopping  looking overhead to place cones on tall mirror  Cued to increase pace; report of mild dizziness at end of movement   bending to place cone on floor, forward/side step over it Good stability; c/o mild "spin in my head"   D2 flexion to cone 2x5  Good pace; c/o mild dizziness            PATIENT EDUCATION: Education details: HEP update; answered pt's questions on different vestibular conditions Person educated: Patient Education method: Explanation, Demonstration, Tactile cues, Verbal cues, and Handouts Education comprehension: verbalized understanding and returned demonstration    HOME EXERCISE PROGRAM Access Code: [email or web address] URL: [email or web address] Date: 04/12/2023 Prepared by: [facility] - Outpatient  Rehab - [facility]  Exercises - Seated Horizontal Smooth Pursuit  - 1 x daily - 5 x weekly - 2 sets - 10 reps - Seated Vertical Smooth Pursuit  - 1 x daily - 5 x weekly - 2 sets - 10 reps - Standing with Head Nod  - 1 x daily - 5 x weekly - 2-3 sets - 30 sec  hold - Standing with Head Rotation  - 1 x daily - 5 x weekly - 2-3 sets - 30 sec hold - Standing Diagonal Chops with Medicine Ball  - 1 x daily - 5 x weekly - 2 sets - 5 reps   Note: Objective measures were completed at Evaluation unless otherwise noted.  DIAGNOSTIC FINDINGS: 11/19/22 brain/AIC MRI:  No evidence of an acute intracranial abnormality.Mild multifocal T2 FLAIR hyperintense signal abnormality within the cerebral white matter, nonspecific but most often secondary to chronic small vessel ischemia. 5. There are a few chronic microhemorrhages scattered within the supratentorial brain and cerebellum. 6. Partially empty sella turcica. This finding can reflect incidental anatomic variation, or alternatively, it can be associated with chronic idiopathic intracranial hypertension   COGNITION: Overall cognitive status: Within functional limits for tasks assessed   SENSATION: Pt reports occasional UE N/T depending on sleeping position   COORDINATION:  Alternating  pronation/supination: slow B Alternating toe tap: WNL B Finger to nose: WNL   POSTURE:  rounded shoulders, slightly forward head, guarded  GAIT: Gait pattern: Slightly unsteady, R-ward lean Assistive device utilized: None Level of assistance: Modified independence    PATIENT SURVEYS:  FOTO [phone]  VESTIBULAR ASSESSMENT:  GENERAL OBSERVATION: pt wears readers   OCULOMOTOR EXAM:  Ocular Alignment: normal  Ocular ROM: No Limitations  Spontaneous Nystagmus: absent  Gaze-Induced Nystagmus: absent  Smooth Pursuits: saccades ~3 inferiorly; c/o hard to focus/woozy  Saccades: hypometric/undershoots slightly inferiorly   Convergence/Divergence: ~6 inches d/t L eye   VESTIBULAR - OCULAR REFLEX:   Slow VOR: Comment: small movements, especially reduced to R; no sx  VOR Cancellation: Normal  Head-Impulse Test: HIT Right: negative HIT Left: negative      POSITIONAL TESTING:  Right Roll Test: negative Left  Roll Test: negative   Right Sidelying: negative; reports feeling "off" upon lying down and sitting up Left Sidelying: negative; reports feeling "off" upon lying down and sitting up but less intense   Right Dix-Hallpike: negative; reports feeling "off" upon lying down and sitting up; worse on this side  Left Dix-Hallpike:negative; reports feeling "off" upon lying down and sitting up                                                                                                                            TREATMENT DATE: 03/14/23   PATIENT EDUCATION: Education details: prognosis, POC, HEP Person educated: Patient Education method: Explanation, Demonstration, Tactile cues, Verbal cues, and Handouts Education comprehension: verbalized understanding and returned demonstration  HOME EXERCISE PROGRAM: Access Code: [email or web address] URL: [email or web address] Date: 03/14/2023 Prepared by: [facility] - Outpatient  Rehab - [facility]  Exercises - Brandt-Daroff Vestibular Exercise  - 1 x daily - 5 x weekly - 2 sets - 10 reps - 3-5 hold - Seated Horizontal Smooth Pursuit  - 1 x daily - 5 x weekly - 2 sets - 10 reps - Seated Vertical Smooth Pursuit  - 1 x daily - 5 x weekly - 2 sets - 10 reps   GOALS: Goals reviewed with patient? Yes  SHORT TERM GOALS: Target date: 04/04/2023  Patient to be independent with initial HEP. Baseline: HEP initiated Goal status: MET    LONG TERM GOALS: Target date: 04/25/2023  Patient to be independent with advanced HEP. Baseline: Not yet initiated  Goal status: IN PROGRESS  Patient to report 0/10 dizziness with quick head movements. Baseline: symptomatic  Goal status: IN PROGRESS  Patient will report 0/10 dizziness with bed mobility.  Baseline: Symptomatic  Goal status: IN PROGRESS  Patient to demonstrate mild-moderate sway with M-CTSIB condition with eyes closed/foam surface in order to improve safety in environments with uneven surfaces  and dim lighting. Baseline: mild-moderate 03/22/23  Goal status: MET 03/22/23   Patient to score at least 20/24 on DGI in order to decrease risk of falls. Baseline: 22/24 03/21/22 Goal status: MET 03/22/23  Patient to  score at least 57 on FOTO in order to indicate improved functional outcomes.  Baseline: 45 Goal status: IN PROGRESS   ASSESSMENT:  CLINICAL IMPRESSION: Patient arrived to session with report of no dizziness while on vacation or since coming home. Reviewed HEP update from last session which patient performed with great speed and no dizziness. Progressed to standing, with report of mild dizziness reported upon stopping. Continued with other habituation tasks with patient demonstrating overall much quicker speed and less apprehension than previous session. Patient remarks on improved confidence with movement than before. No complaints at end of session.   OBJECTIVE IMPAIRMENTS: Abnormal gait, decreased activity tolerance, decreased balance, decreased coordination, and dizziness.   ACTIVITY LIMITATIONS: carrying, lifting, bending, standing, squatting, sleeping, stairs, transfers, bed mobility, bathing, dressing, reach over head, and hygiene/grooming  PARTICIPATION LIMITATIONS: meal prep, cleaning, laundry, driving, shopping, community activity, yard work, and church  PERSONAL FACTORS: Age, Behavior pattern, Past/current experiences, Time since onset of injury/illness/exacerbation, and 3+ comorbidities: Anemia, anxiety, asthma, depression, GERD, HTN, LBBB, pericarditis  are also affecting patient's functional outcome.   REHAB POTENTIAL: Good  CLINICAL DECISION MAKING: Evolving/moderate complexity  EVALUATION COMPLEXITY: Moderate   PLAN:  PT FREQUENCY: 1x/week  PT DURATION: 6 weeks  PLANNED INTERVENTIONS: 97164- PT Re-evaluation, 97110-Therapeutic exercises, 97530- Therapeutic activity, 97112- Neuromuscular re-education, 97535- Self Care, 16109- Manual therapy, [phone]- Gait  training, [phone]- Canalith repositioning, Patient/Family education, Balance training, Stair training, Taping, Dry Needling, Joint mobilization, Spinal mobilization, Vestibular training, Cryotherapy, and Moist heat  PLAN FOR NEXT SESSION: reassessment; continue working on quick head movements, progress to bending, reaching, turns; work on Press photographer and reducing fear avoidance behaviors     Baldemar Friday, , DPT 04/12/23 1:56 PM  [facility]patient Rehab at [facility] [address] [place], [place] [address] Phone # [phone] Fax # [phone]

## 2023-04-12 ENCOUNTER — Encounter: Payer: Self-pay | Admitting: Physical Therapy

## 2023-04-12 ENCOUNTER — Ambulatory Visit: Payer: Medicare PPO | Admitting: Physical Therapy

## 2023-04-12 DIAGNOSIS — R42 Dizziness and giddiness: Secondary | ICD-10-CM

## 2023-04-12 DIAGNOSIS — R2681 Unsteadiness on feet: Secondary | ICD-10-CM

## 2023-04-15 NOTE — Therapy (Incomplete)
OUTPATIENT PHYSICAL THERAPY VESTIBULAR TREATMENT     Patient Name: Miranda Berger MRN: [id number] DOB:18-Aug-1955, 68 y.o., female Today's Date: 04/15/2023  END OF SESSION:       Past Medical History:  Diagnosis Date   Abdominal pain 06/22/2012   Anemia    in the past    Anxiety    Aortic stenosis 06/20/2017   Asthma    Chronic interstitial cystitis 06/22/2012   Costochondral chest pain 04/04/2018   Degenerative tear of medial meniscus of right knee 04/21/2016   Depression    Difficult or painful urination 06/22/2012   Dyspareunia 06/22/2012   Family history of adverse reaction to anesthesia    mom had nausea   Female pelvic pain 06/22/2012   GERD (gastroesophageal reflux disease)    Hepatitis    Hepatitis A in 3rd grade?   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   Obstructive sleep apnea    Pericarditis 09/12/2017   PONV (postoperative nausea and vomiting)    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 07/26/2017   [employer], size 23   Urinary urgency 06/22/2012   Past Surgical History:  Procedure Laterality Date   ABDOMINAL HYSTERECTOMY     AORTIC VALVE REPLACEMENT N/A 07/26/2017   Procedure: MINIMALLY INVASIVE AORTIC VALVE REPLACEMENT (AVR);  Surgeon: Purcell Nails, MD;  Location: [facility] OR;  Service: Open Heart Surgery;  Laterality: N/A;   BLADDER SURGERY  2011   bone spur removal Right 08/2022   great toe   CARDIAC CATHETERIZATION     CARPAL TUNNEL RELEASE  2012   CHOLECYSTECTOMY     RIGHT/LEFT HEART CATH AND CORONARY ANGIOGRAPHY N/A 07/08/2017   Procedure: RIGHT/LEFT HEART CATH AND CORONARY ANGIOGRAPHY;  Surgeon: Tonny Bollman, MD;   Location: [facility] INVASIVE CV LAB;  Service: Cardiovascular;  Laterality: N/A;   TEE WITHOUT CARDIOVERSION N/A 07/26/2017   Procedure: TRANSESOPHAGEAL ECHOCARDIOGRAM (TEE);  Surgeon: Purcell Nails, MD;  Location: [facility] OR;  Service: Open Heart Surgery;  Laterality: N/A;   TONSILLECTOMY     Patient Active Problem List   Diagnosis Date Noted   Depression 12/01/2021   Family history of adverse reaction to anesthesia 12/01/2021   GERD (gastroesophageal reflux disease) 12/01/2021   Hepatitis 12/01/2021   PONV (postoperative nausea and vomiting) 12/01/2021   History of hypertension 12/01/2021   LBBB (left bundle branch block) 08/08/2017   Obstructive sleep apnea    Anxiety    Asthma    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 07/26/2017   Aortic stenosis 06/20/2017   Degenerative tear of medial meniscus of right knee 04/21/2016   High-tone pelvic floor dysfunction 12/26/2013   Dyspareunia 06/22/2012    PCP: Farris Has, MD REFERRING PROVIDER: Farris Has, MD   REFERRING DIAG: R42 (ICD-10-CM) - Dizziness and giddiness  THERAPY DIAG:  No diagnosis found.  ONSET DATE: 1 year  Rationale for Evaluation and Treatment: Rehabilitation  SUBJECTIVE:   SUBJECTIVE STATEMENT: Had a nice trip. Did not have any experiences with dizziness while on vacation. None since she has gotten home either.    Pt accompanied by: self  PERTINENT HISTORY: Anemia, anxiety, asthma, depression, GERD, HTN, LBBB, pericarditis  PAIN:  Are you having pain? No  PRECAUTIONS: None  RED FLAGS: None   WEIGHT BEARING RESTRICTIONS: No  FALLS: Has patient fallen in last 6 months? No  LIVING ENVIRONMENT: Lives with: lives alone Lives in: House/apartment Stairs:  3-4 steps to enter; 1 story home  Has following equipment at home: None  PLOF: Independent; retired; likes reading, sewing, crafts   PATIENT GOALS: improve dizziness   OBJECTIVE:    TODAY'S TREATMENT:  04/19/23 Activity Comments                       TODAY'S TREATMENT: 04/12/23 Activity Comments  review of HEP update: sitting head nods 30" sitting head turns 30" Report of feeling apprehensive at quicker pace but good quick pace was demonstrated without dizziness   standing head nods 30" Report of mild dizziness upon stopping   standing head turns 30" Report of mild dizziness upon stopping  looking overhead to place cones on tall mirror  Cued to increase pace; report of mild dizziness at end of movement   bending to place cone on floor, forward/side step over it Good stability; c/o mild "spin in my head"   D2 flexion to cone 2x5  Good pace; c/o mild dizziness            PATIENT EDUCATION: Education details: HEP update; answered pt's questions on different vestibular conditions Person educated: Patient Education method: Explanation, Demonstration, Tactile cues, Verbal cues, and Handouts Education comprehension: verbalized understanding and returned demonstration    HOME EXERCISE PROGRAM Access Code: [email or web address] URL: [email or web address] Date: 04/12/2023 Prepared by: [facility] - Outpatient  Rehab - [facility]  Exercises - Seated Horizontal Smooth Pursuit  - 1 x daily - 5 x weekly - 2 sets - 10 reps - Seated Vertical Smooth Pursuit  - 1 x daily - 5 x weekly - 2 sets - 10 reps - Standing with Head Nod  - 1 x daily - 5 x weekly - 2-3 sets - 30 sec hold - Standing with Head Rotation  - 1 x daily - 5 x weekly - 2-3 sets - 30 sec hold - Standing Diagonal Chops with Medicine Ball  - 1 x daily - 5 x weekly - 2 sets - 5 reps   Note: Objective measures were completed at Evaluation unless otherwise noted.  DIAGNOSTIC FINDINGS: 11/19/22 brain/AIC MRI:  No evidence of an acute intracranial abnormality.Mild multifocal T2 FLAIR hyperintense signal abnormality within the cerebral white matter, nonspecific but most often secondary to chronic small vessel ischemia. 5.  There are a few chronic microhemorrhages scattered within the supratentorial brain and cerebellum. 6. Partially empty sella turcica. This finding can reflect incidental anatomic variation, or alternatively, it can be associated with chronic idiopathic intracranial hypertension   COGNITION: Overall cognitive status: Within functional limits for tasks assessed   SENSATION: Pt reports occasional UE N/T depending on sleeping position   COORDINATION:  Alternating pronation/supination: slow B Alternating toe tap: WNL B Finger to nose: WNL   POSTURE:  rounded shoulders, slightly forward head, guarded  GAIT: Gait pattern: Slightly unsteady, R-ward lean Assistive device utilized: None Level of assistance: Modified independence    PATIENT SURVEYS:  FOTO [phone]  VESTIBULAR ASSESSMENT:  GENERAL OBSERVATION: pt wears readers   OCULOMOTOR EXAM:  Ocular Alignment: normal  Ocular ROM: No Limitations  Spontaneous Nystagmus: absent  Gaze-Induced Nystagmus: absent  Smooth Pursuits: saccades ~3 inferiorly; c/o hard to focus/woozy  Saccades: hypometric/undershoots slightly inferiorly   Convergence/Divergence: ~6 inches d/t L eye   VESTIBULAR - OCULAR REFLEX:  Slow VOR: Comment: small movements, especially reduced to R; no sx  VOR Cancellation: Normal  Head-Impulse Test: HIT Right: negative HIT Left: negative      POSITIONAL TESTING:  Right Roll Test: negative Left Roll Test: negative   Right Sidelying: negative; reports feeling "off" upon lying down and sitting up Left Sidelying: negative; reports feeling "off" upon lying down and sitting up but less intense   Right Dix-Hallpike: negative; reports feeling "off" upon lying down and sitting up; worse on this side  Left Dix-Hallpike:negative; reports feeling "off" upon lying down and sitting up                                                                                                                            TREATMENT  DATE: 03/14/23   PATIENT EDUCATION: Education details: prognosis, POC, HEP Person educated: Patient Education method: Explanation, Demonstration, Tactile cues, Verbal cues, and Handouts Education comprehension: verbalized understanding and returned demonstration  HOME EXERCISE PROGRAM: Access Code: [email or web address] URL: [email or web address] Date: 03/14/2023 Prepared by: [facility] - Outpatient  Rehab - [facility]  Exercises - Brandt-Daroff Vestibular Exercise  - 1 x daily - 5 x weekly - 2 sets - 10 reps - 3-5 hold - Seated Horizontal Smooth Pursuit  - 1 x daily - 5 x weekly - 2 sets - 10 reps - Seated Vertical Smooth Pursuit  - 1 x daily - 5 x weekly - 2 sets - 10 reps   GOALS: Goals reviewed with patient? Yes  SHORT TERM GOALS: Target date: 04/04/2023  Patient to be independent with initial HEP. Baseline: HEP initiated Goal status: MET    LONG TERM GOALS: Target date: 04/25/2023  Patient to be independent with advanced HEP. Baseline: Not yet initiated  Goal status: IN PROGRESS  Patient to report 0/10 dizziness with quick head movements. Baseline: symptomatic  Goal status: IN PROGRESS  Patient will report 0/10 dizziness with bed mobility.  Baseline: Symptomatic  Goal status: IN PROGRESS  Patient to demonstrate mild-moderate sway with M-CTSIB condition with eyes closed/foam surface in order to improve safety in environments with uneven surfaces and dim lighting. Baseline: mild-moderate 03/22/23  Goal status: MET 03/22/23   Patient to score at least 20/24 on DGI in order to decrease risk of falls. Baseline: 22/24 03/21/22 Goal status: MET 03/22/23  Patient to score at least 57 on FOTO in order to indicate improved functional outcomes.  Baseline: 45 Goal status: IN PROGRESS   ASSESSMENT:  CLINICAL IMPRESSION: Patient arrived to session with report of no dizziness while on vacation or since coming home. Reviewed HEP update from last session which patient  performed with great speed and no dizziness. Progressed to standing, with report of mild dizziness reported upon stopping. Continued with other habituation tasks with patient demonstrating overall much quicker speed and less apprehension than previous session. Patient remarks on improved confidence with movement than before. No complaints at end of session.   OBJECTIVE IMPAIRMENTS: Abnormal gait, decreased  activity tolerance, decreased balance, decreased coordination, and dizziness.   ACTIVITY LIMITATIONS: carrying, lifting, bending, standing, squatting, sleeping, stairs, transfers, bed mobility, bathing, dressing, reach over head, and hygiene/grooming  PARTICIPATION LIMITATIONS: meal prep, cleaning, laundry, driving, shopping, community activity, yard work, and church  PERSONAL FACTORS: Age, Behavior pattern, Past/current experiences, Time since onset of injury/illness/exacerbation, and 3+ comorbidities: Anemia, anxiety, asthma, depression, GERD, HTN, LBBB, pericarditis  are also affecting patient's functional outcome.   REHAB POTENTIAL: Good  CLINICAL DECISION MAKING: Evolving/moderate complexity  EVALUATION COMPLEXITY: Moderate   PLAN:  PT FREQUENCY: 1x/week  PT DURATION: 6 weeks  PLANNED INTERVENTIONS: 97164- PT Re-evaluation, 97110-Therapeutic exercises, 97530- Therapeutic activity, 97112- Neuromuscular re-education, 97535- Self Care, 16109- Manual therapy, [phone]- Gait training, [phone]- Canalith repositioning, Patient/Family education, Balance training, Stair training, Taping, Dry Needling, Joint mobilization, Spinal mobilization, Vestibular training, Cryotherapy, and Moist heat  PLAN FOR NEXT SESSION: reassessment; continue working on quick head movements, progress to bending, reaching, turns; work on Psychologist, forensic confidence and reducing fear avoidance behaviors     Baldemar Friday, [place], DPT 04/15/23 11:28 AM  [facility] Outpatient Rehab at [facility] [address], [address] [place], [place] [address] Phone # [phone] Fax # [phone]

## 2023-04-19 ENCOUNTER — Ambulatory Visit: Payer: Medicare PPO | Admitting: Physical Therapy

## 2023-04-19 DIAGNOSIS — J988 Other specified respiratory disorders: Secondary | ICD-10-CM | POA: Diagnosis not present

## 2023-04-19 DIAGNOSIS — J45909 Unspecified asthma, uncomplicated: Secondary | ICD-10-CM | POA: Diagnosis not present

## 2023-04-19 DIAGNOSIS — R059 Cough, unspecified: Secondary | ICD-10-CM | POA: Diagnosis not present

## 2023-04-25 NOTE — Therapy (Signed)
 OUTPATIENT PHYSICAL THERAPY VESTIBULAR TREATMENT     Patient Name: Miranda Berger MRN: [id number] DOB:27-Mar-1955, 68 y.o., female Today's Date: 04/25/2023  END OF SESSION:       Past Medical History:  Diagnosis Date   Abdominal pain 06/22/2012   Anemia    in the past    Anxiety    Aortic stenosis 06/20/2017   Asthma    Chronic interstitial cystitis 06/22/2012   Costochondral chest pain 04/04/2018   Degenerative tear of medial meniscus of right knee 04/21/2016   Depression    Difficult or painful urination 06/22/2012   Dyspareunia 06/22/2012   Family history of adverse reaction to anesthesia    mom had nausea   Female pelvic pain 06/22/2012   GERD (gastroesophageal reflux disease)    Hepatitis    Hepatitis A in 3rd grade?   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   Obstructive sleep apnea    Pericarditis 09/12/2017   PONV (postoperative nausea and vomiting)    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 07/26/2017   [employer], size 23   Urinary urgency 06/22/2012   Past Surgical History:  Procedure Laterality Date   ABDOMINAL HYSTERECTOMY     AORTIC VALVE REPLACEMENT N/A 07/26/2017   Procedure: MINIMALLY INVASIVE AORTIC VALVE REPLACEMENT (AVR);  Surgeon: Purcell Nails, MD;  Location: [facility] OR;  Service: Open Heart Surgery;  Laterality: N/A;   BLADDER SURGERY  2011   bone spur removal Right 08/2022   great toe   CARDIAC CATHETERIZATION     CARPAL TUNNEL RELEASE  2012   CHOLECYSTECTOMY     RIGHT/LEFT HEART CATH AND CORONARY ANGIOGRAPHY N/A 07/08/2017   Procedure: RIGHT/LEFT HEART CATH AND CORONARY ANGIOGRAPHY;  Surgeon: Tonny Bollman, MD;   Location: [facility] INVASIVE CV LAB;  Service: Cardiovascular;  Laterality: N/A;   TEE WITHOUT CARDIOVERSION N/A 07/26/2017   Procedure: TRANSESOPHAGEAL ECHOCARDIOGRAM (TEE);  Surgeon: Purcell Nails, MD;  Location: [facility] OR;  Service: Open Heart Surgery;  Laterality: N/A;   TONSILLECTOMY     Patient Active Problem List   Diagnosis Date Noted   Depression 12/01/2021   Family history of adverse reaction to anesthesia 12/01/2021   GERD (gastroesophageal reflux disease) 12/01/2021   Hepatitis 12/01/2021   PONV (postoperative nausea and vomiting) 12/01/2021   History of hypertension 12/01/2021   LBBB (left bundle branch block) 08/08/2017   Obstructive sleep apnea    Anxiety    Asthma    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 07/26/2017   Aortic stenosis 06/20/2017   Degenerative tear of medial meniscus of right knee 04/21/2016   High-tone pelvic floor dysfunction 12/26/2013   Dyspareunia 06/22/2012    PCP: Farris Has, MD REFERRING PROVIDER: Farris Has, MD   REFERRING DIAG: R42 (ICD-10-CM) - Dizziness and giddiness  THERAPY DIAG:  No diagnosis found.  ONSET DATE: 1 year  Rationale for Evaluation and Treatment: Rehabilitation  SUBJECTIVE:   SUBJECTIVE STATEMENT: Had a nice trip. Did not have any experiences with dizziness while on vacation. None since she has gotten home either.    Pt accompanied by: self  PERTINENT HISTORY: Anemia, anxiety, asthma, depression, GERD, HTN, LBBB, pericarditis  PAIN:  Are you having pain? No  PRECAUTIONS: None  RED FLAGS: None   WEIGHT BEARING RESTRICTIONS: No  FALLS: Has patient fallen in last 6 months? No  LIVING ENVIRONMENT: Lives with: lives alone Lives in: House/apartment Stairs:  3-4 steps to enter; 1 story home  Has following equipment at home: None  PLOF: Independent; retired; likes reading, sewing, crafts   PATIENT GOALS: improve dizziness   OBJECTIVE:    TODAY'S TREATMENT:  04/27/23 Activity Comments                       TODAY'S TREATMENT: 04/12/23 Activity Comments  review of HEP update: sitting head nods 30" sitting head turns 30" Report of feeling apprehensive at quicker pace but good quick pace was demonstrated without dizziness   standing head nods 30" Report of mild dizziness upon stopping   standing head turns 30" Report of mild dizziness upon stopping  looking overhead to place cones on tall mirror  Cued to increase pace; report of mild dizziness at end of movement   bending to place cone on floor, forward/side step over it Good stability; c/o mild "spin in my head"   D2 flexion to cone 2x5  Good pace; c/o mild dizziness            PATIENT EDUCATION: Education details: HEP update; answered pt's questions on different vestibular conditions Person educated: Patient Education method: Explanation, Demonstration, Tactile cues, Verbal cues, and Handouts Education comprehension: verbalized understanding and returned demonstration    HOME EXERCISE PROGRAM Access Code: [email or web address] URL: [email or web address] Date: 04/12/2023 Prepared by: [facility] - Outpatient  Rehab - [facility]  Exercises - Seated Horizontal Smooth Pursuit  - 1 x daily - 5 x weekly - 2 sets - 10 reps - Seated Vertical Smooth Pursuit  - 1 x daily - 5 x weekly - 2 sets - 10 reps - Standing with Head Nod  - 1 x daily - 5 x weekly - 2-3 sets - 30 sec hold - Standing with Head Rotation  - 1 x daily - 5 x weekly - 2-3 sets - 30 sec hold - Standing Diagonal Chops with Medicine Ball  - 1 x daily - 5 x weekly - 2 sets - 5 reps   Note: Objective measures were completed at Evaluation unless otherwise noted.  DIAGNOSTIC FINDINGS: 11/19/22 brain/AIC MRI:  No evidence of an acute intracranial abnormality.Mild multifocal T2 FLAIR hyperintense signal abnormality within the cerebral white matter, nonspecific but most often secondary to chronic small vessel ischemia. 5.  There are a few chronic microhemorrhages scattered within the supratentorial brain and cerebellum. 6. Partially empty sella turcica. This finding can reflect incidental anatomic variation, or alternatively, it can be associated with chronic idiopathic intracranial hypertension   COGNITION: Overall cognitive status: Within functional limits for tasks assessed   SENSATION: Pt reports occasional UE N/T depending on sleeping position   COORDINATION:  Alternating pronation/supination: slow B Alternating toe tap: WNL B Finger to nose: WNL   POSTURE:  rounded shoulders, slightly forward head, guarded  GAIT: Gait pattern: Slightly unsteady, R-ward lean Assistive device utilized: None Level of assistance: Modified independence    PATIENT SURVEYS:  FOTO [phone]  VESTIBULAR ASSESSMENT:  GENERAL OBSERVATION: pt wears readers   OCULOMOTOR EXAM:  Ocular Alignment: normal  Ocular ROM: No Limitations  Spontaneous Nystagmus: absent  Gaze-Induced Nystagmus: absent  Smooth Pursuits: saccades ~3 inferiorly; c/o hard to focus/woozy  Saccades: hypometric/undershoots slightly inferiorly   Convergence/Divergence: ~6 inches d/t L eye   VESTIBULAR - OCULAR REFLEX:  Slow VOR: Comment: small movements, especially reduced to R; no sx  VOR Cancellation: Normal  Head-Impulse Test: HIT Right: negative HIT Left: negative      POSITIONAL TESTING:  Right Roll Test: negative Left Roll Test: negative   Right Sidelying: negative; reports feeling "off" upon lying down and sitting up Left Sidelying: negative; reports feeling "off" upon lying down and sitting up but less intense   Right Dix-Hallpike: negative; reports feeling "off" upon lying down and sitting up; worse on this side  Left Dix-Hallpike:negative; reports feeling "off" upon lying down and sitting up                                                                                                                            TREATMENT  DATE: 03/14/23   PATIENT EDUCATION: Education details: prognosis, POC, HEP Person educated: Patient Education method: Explanation, Demonstration, Tactile cues, Verbal cues, and Handouts Education comprehension: verbalized understanding and returned demonstration  HOME EXERCISE PROGRAM: Access Code: [email or web address] URL: [email or web address] Date: 03/14/2023 Prepared by: [facility] - Outpatient  Rehab - [facility]  Exercises - Brandt-Daroff Vestibular Exercise  - 1 x daily - 5 x weekly - 2 sets - 10 reps - 3-5 hold - Seated Horizontal Smooth Pursuit  - 1 x daily - 5 x weekly - 2 sets - 10 reps - Seated Vertical Smooth Pursuit  - 1 x daily - 5 x weekly - 2 sets - 10 reps   GOALS: Goals reviewed with patient? Yes  SHORT TERM GOALS: Target date: 04/04/2023  Patient to be independent with initial HEP. Baseline: HEP initiated Goal status: MET    LONG TERM GOALS: Target date: 04/25/2023  Patient to be independent with advanced HEP. Baseline: Not yet initiated  Goal status: IN PROGRESS  Patient to report 0/10 dizziness with quick head movements. Baseline: symptomatic  Goal status: IN PROGRESS  Patient will report 0/10 dizziness with bed mobility.  Baseline: Symptomatic  Goal status: IN PROGRESS  Patient to demonstrate mild-moderate sway with M-CTSIB condition with eyes closed/foam surface in order to improve safety in environments with uneven surfaces and dim lighting. Baseline: mild-moderate 03/22/23  Goal status: MET 03/22/23   Patient to score at least 20/24 on DGI in order to decrease risk of falls. Baseline: 22/24 03/21/22 Goal status: MET 03/22/23  Patient to score at least 57 on FOTO in order to indicate improved functional outcomes.  Baseline: 45 Goal status: IN PROGRESS   ASSESSMENT:  CLINICAL IMPRESSION: Patient arrived to session with report of no dizziness while on vacation or since coming home. Reviewed HEP update from last session which patient  performed with great speed and no dizziness. Progressed to standing, with report of mild dizziness reported upon stopping. Continued with other habituation tasks with patient demonstrating overall much quicker speed and less apprehension than previous session. Patient remarks on improved confidence with movement than before. No complaints at end of session.   OBJECTIVE IMPAIRMENTS: Abnormal gait, decreased  activity tolerance, decreased balance, decreased coordination, and dizziness.   ACTIVITY LIMITATIONS: carrying, lifting, bending, standing, squatting, sleeping, stairs, transfers, bed mobility, bathing, dressing, reach over head, and hygiene/grooming  PARTICIPATION LIMITATIONS: meal prep, cleaning, laundry, driving, shopping, community activity, yard work, and church  PERSONAL FACTORS: Age, Behavior pattern, Past/current experiences, Time since onset of injury/illness/exacerbation, and 3+ comorbidities: Anemia, anxiety, asthma, depression, GERD, HTN, LBBB, pericarditis  are also affecting patient's functional outcome.   REHAB POTENTIAL: Good  CLINICAL DECISION MAKING: Evolving/moderate complexity  EVALUATION COMPLEXITY: Moderate   PLAN:  PT FREQUENCY: 1x/week  PT DURATION: 6 weeks  PLANNED INTERVENTIONS: 97164- PT Re-evaluation, 97110-Therapeutic exercises, 97530- Therapeutic activity, 97112- Neuromuscular re-education, 97535- Self Care, 16109- Manual therapy, [phone]- Gait training, [phone]- Canalith repositioning, Patient/Family education, Balance training, Stair training, Taping, Dry Needling, Joint mobilization, Spinal mobilization, Vestibular training, Cryotherapy, and Moist heat  PLAN FOR NEXT SESSION: reassessment; continue working on quick head movements, progress to bending, reaching, turns; work on improving balance confidence and reducing fear avoidance behaviors     Referring diagnosis? R42 (ICD-10-CM) - Dizziness and giddiness Treatment diagnosis? (if different than referring  diagnosis) *** What was this (referring dx) caused by? []  Surgery []  Fall []  Ongoing issue []  Arthritis []  Other: ____________  Laterality: []  Rt []  Lt []  Both  Check all possible CPT codes:  *CHOOSE 10 OR LESS*    See Planned Interventions listed in the Plan section of the Evaluation.      Baldemar Friday, PT, DPT 04/25/23 8:34 AM  [facility] [address] [place], [address] [place], [place] [address] Phone # [phone] Fax # [phone]

## 2023-04-26 ENCOUNTER — Ambulatory Visit: Payer: Medicare PPO | Attending: Family Medicine | Admitting: Physical Therapy

## 2023-04-26 ENCOUNTER — Encounter: Payer: Self-pay | Admitting: Physical Therapy

## 2023-04-26 DIAGNOSIS — R2681 Unsteadiness on feet: Secondary | ICD-10-CM | POA: Insufficient documentation

## 2023-04-26 DIAGNOSIS — R42 Dizziness and giddiness: Secondary | ICD-10-CM | POA: Diagnosis not present

## 2023-04-29 DIAGNOSIS — J3089 Other allergic rhinitis: Secondary | ICD-10-CM | POA: Diagnosis not present

## 2023-04-29 DIAGNOSIS — J300 Vasomotor rhinitis: Secondary | ICD-10-CM | POA: Diagnosis not present

## 2023-04-29 DIAGNOSIS — J4530 Mild persistent asthma, uncomplicated: Secondary | ICD-10-CM | POA: Diagnosis not present

## 2023-04-29 DIAGNOSIS — H1045 Other chronic allergic conjunctivitis: Secondary | ICD-10-CM | POA: Diagnosis not present

## 2023-04-29 NOTE — Therapy (Signed)
 OUTPATIENT PHYSICAL THERAPY VESTIBULAR NOTE     Patient Name: Miranda Berger MRN: [id number] DOB:11/16/1955, 68 y.o., female Today's Date: 05/03/2023        END OF SESSION:  PT End of Session - 05/03/23 1354     Visit Number 6    Number of Visits 8    Date for PT Re-Evaluation 05/24/23    Authorization Type [employer]    Authorization Time Period approved 4 more PT visits from 04/26/2023-05/24/2023    Authorization - Visit Number 2    Authorization - Number of Visits 4    PT Start Time 1319    PT Stop Time 1358    PT Time Calculation (min) 39 min    Equipment Utilized During Treatment Gait belt    Activity Tolerance Patient tolerated treatment well    Behavior During Therapy [facility] for tasks assessed/performed                  Past Medical History:  Diagnosis Date   Abdominal pain 06/22/2012   Anemia    in the past    Anxiety    Aortic stenosis 06/20/2017   Asthma    Chronic interstitial cystitis 06/22/2012   Costochondral chest pain 04/04/2018   Degenerative tear of medial meniscus of right knee 04/21/2016   Depression    Difficult or painful urination 06/22/2012   Dyspareunia 06/22/2012   Family history of adverse reaction to anesthesia    mom had nausea   Female pelvic pain 06/22/2012   GERD (gastroesophageal reflux disease)    Hepatitis    Hepatitis A in 3rd grade?   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   Obstructive sleep apnea    Pericarditis 09/12/2017   PONV (postoperative nausea and vomiting)    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 07/26/2017   [employer], size 23   Urinary urgency 06/22/2012   Past  Surgical History:  Procedure Laterality Date   ABDOMINAL HYSTERECTOMY     AORTIC VALVE REPLACEMENT N/A 07/26/2017   Procedure: MINIMALLY INVASIVE AORTIC VALVE REPLACEMENT (AVR);  Surgeon: Purcell Nails, MD;  Location: [facility] OR;  Service: Open Heart Surgery;  Laterality: N/A;   BLADDER SURGERY  2011   bone spur removal Right 08/2022   great toe   CARDIAC CATHETERIZATION     CARPAL TUNNEL RELEASE  2012   CHOLECYSTECTOMY     RIGHT/LEFT HEART CATH AND CORONARY ANGIOGRAPHY N/A 07/08/2017   Procedure: RIGHT/LEFT HEART CATH AND CORONARY ANGIOGRAPHY;  Surgeon: Tonny Bollman, MD;  Location: [facility] INVASIVE CV LAB;  Service: Cardiovascular;  Laterality: N/A;   TEE WITHOUT CARDIOVERSION N/A 07/26/2017   Procedure: TRANSESOPHAGEAL ECHOCARDIOGRAM (TEE);  Surgeon: Purcell Nails, MD;  Location: [facility] OR;  Service: Open Heart Surgery;  Laterality: N/A;   TONSILLECTOMY     Patient Active Problem List   Diagnosis Date Noted   Depression 12/01/2021   Family history of adverse reaction to anesthesia 12/01/2021   GERD (gastroesophageal reflux disease) 12/01/2021   Hepatitis 12/01/2021   PONV (postoperative nausea and vomiting) 12/01/2021   History of hypertension 12/01/2021   LBBB (left bundle branch block) 08/08/2017   Obstructive sleep apnea    Anxiety    Asthma  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 07/26/2017   Aortic stenosis 06/20/2017   Degenerative tear of medial meniscus of right knee 04/21/2016   High-tone pelvic floor dysfunction 12/26/2013   Dyspareunia 06/22/2012    PCP: Farris Has, MD REFERRING PROVIDER: Farris Has, MD   REFERRING DIAG: R42 (ICD-10-CM) - Dizziness and giddiness  THERAPY DIAG:  Dizziness and giddiness  Unsteadiness on feet  ONSET DATE: 1 year  Rationale for Evaluation and Treatment: Rehabilitation  SUBJECTIVE:   SUBJECTIVE STATEMENT: Still moving but today is supposed to be the last load. Has tried to do some  stretches and that has helped but "I'm having some back pain today." Reports that overall is doing better than last week.    Pt accompanied by: self  PERTINENT HISTORY: Anemia, anxiety, asthma, depression, GERD, HTN, LBBB, pericarditis  PAIN:  Are you having pain? Yes: NPRS scale: 4-5/10 Pain location: B LB Pain description: aching, sore Aggravating factors: bending, lifting Relieving factors: heat, tylenol  PRECAUTIONS: None  RED FLAGS: None   WEIGHT BEARING RESTRICTIONS: No  FALLS: Has patient fallen in last 6 months? No  LIVING ENVIRONMENT: Lives with: lives alone Lives in: House/apartment Stairs:  3-4 steps to enter; 1 story home  Has following equipment at home: None  PLOF: Independent; retired; likes reading, sewing, crafts   PATIENT GOALS: improve dizziness   OBJECTIVE:     TODAY'S TREATMENT: 05/03/23 Activity Comments  standing head nods 2x30"  Good quick pace; c/o mild dizziness upon stopping   standing head turns 2x30"  Good quick pace; c/o very mild dizziness upon stopping   bending to place cone on floor, forward step over it  4x  C/o mild-moderate dizziness, improved after standing break. 2nd trial with c/o less symptoms   1/2 turns to targets + alt toe tap on cone  C/o moderate dizziness, requiring sit break. Cueing to slow pace        PATIENT EDUCATION: Education details: discussed tolerance for activities at home; HEP update with edu for safety  Person educated: Patient Education method: Explanation, Demonstration, Tactile cues, Verbal cues, and Handouts Education comprehension: verbalized understanding and returned demonstration     HOME EXERCISE PROGRAM Access Code: [email or web address] URL: [email or web address] Date: 05/03/2023 Prepared by: [facility] - Outpatient  Rehab - [facility]  Exercises - Standing with Head Nod  - 1 x daily - 5 x weekly - 2-3 sets - 30 sec hold - Standing with Head Rotation  - 1 x daily - 5 x weekly -  2-3 sets - 30 sec hold - Standing Diagonal Chops with Medicine Ball  - 1 x daily - 5 x weekly - 2 sets - 5 reps - 180 Degree Pivot Turn with Single Point Cane  - 1 x daily - 5 x weekly - 2 sets - 10 reps - Supine Lower Trunk Rotation  - 1 x daily - 5 x weekly - 2 sets - 10 reps - Supine Single Knee to Chest Stretch  - 1 x daily - 5 x weekly - 2 sets - 30 sec hold - Sidelying Thoracic Rotation with Open Book  - 1 x daily - 5 x weekly - 2 sets - 5-10 reps - Supine Piriformis Stretch with Foot on Ground  - 1 x daily - 5 x weekly - 2 sets - 30 sec hold    Note: Objective measures were completed at Evaluation unless otherwise noted.  DIAGNOSTIC FINDINGS: 11/19/22 brain/AIC MRI:  No evidence of an  acute intracranial abnormality.Mild multifocal T2 FLAIR hyperintense signal abnormality within the cerebral white matter, nonspecific but most often secondary to chronic small vessel ischemia. 5. There are a few chronic microhemorrhages scattered within the supratentorial brain and cerebellum. 6. Partially empty sella turcica. This finding can reflect incidental anatomic variation, or alternatively, it can be associated with chronic idiopathic intracranial hypertension   COGNITION: Overall cognitive status: Within functional limits for tasks assessed   SENSATION: Pt reports occasional UE N/T depending on sleeping position   COORDINATION:  Alternating pronation/supination: slow B Alternating toe tap: WNL B Finger to nose: WNL   POSTURE:  rounded shoulders, slightly forward head, guarded  GAIT: Gait pattern: Slightly unsteady, R-ward lean Assistive device utilized: None Level of assistance: Modified independence    PATIENT SURVEYS:  FOTO [phone]  VESTIBULAR ASSESSMENT:  GENERAL OBSERVATION: pt wears readers   OCULOMOTOR EXAM:  Ocular Alignment: normal  Ocular ROM: No Limitations  Spontaneous Nystagmus: absent  Gaze-Induced Nystagmus: absent  Smooth Pursuits: saccades ~3  inferiorly; c/o hard to focus/woozy  Saccades: hypometric/undershoots slightly inferiorly   Convergence/Divergence: ~6 inches d/t L eye   VESTIBULAR - OCULAR REFLEX:   Slow VOR: Comment: small movements, especially reduced to R; no sx  VOR Cancellation: Normal  Head-Impulse Test: HIT Right: negative HIT Left: negative      POSITIONAL TESTING:  Right Roll Test: negative Left Roll Test: negative   Right Sidelying: negative; reports feeling "off" upon lying down and sitting up Left Sidelying: negative; reports feeling "off" upon lying down and sitting up but less intense   Right Dix-Hallpike: negative; reports feeling "off" upon lying down and sitting up; worse on this side  Left Dix-Hallpike:negative; reports feeling "off" upon lying down and sitting up                                                                                                                            TREATMENT DATE: 03/14/23   PATIENT EDUCATION: Education details: prognosis, POC, HEP Person educated: Patient Education method: Explanation, Demonstration, Tactile cues, Verbal cues, and Handouts Education comprehension: verbalized understanding and returned demonstration  HOME EXERCISE PROGRAM: Access Code: [email or web address] URL: [email or web address] Date: 03/14/2023 Prepared by: [facility] - Outpatient  Rehab - [facility]  Exercises - Brandt-Daroff Vestibular Exercise  - 1 x daily - 5 x weekly - 2 sets - 10 reps - 3-5 hold - Seated Horizontal Smooth Pursuit  - 1 x daily - 5 x weekly - 2 sets - 10 reps - Seated Vertical Smooth Pursuit  - 1 x daily - 5 x weekly - 2 sets - 10 reps   GOALS: Goals reviewed with patient? Yes  SHORT TERM GOALS: Target date: 04/04/2023  Patient to be independent with initial HEP. Baseline: HEP initiated Goal status: MET    LONG TERM GOALS: Target date: 05/24/2023  Patient to be independent with advanced HEP. Baseline: Not yet initiated ; pt not currently  performing d/t being busy with move  04/26/23 Goal status: IN PROGRESS 04/26/23  Patient to report 0/10 dizziness with quick head movements. Baseline: symptomatic; 4-5/10 dizziness 04/26/23 Goal status: IN PROGRESS 04/26/23  Patient will report 0/10 dizziness with bed mobility.  Baseline: Symptomatic ; no dizziness but reports some sinus pressure 04/26/23 Goal status: IN PROGRESS 04/26/23  Patient will report 0/10 dizziness with repeated bending and reaching activities.  Baseline: Symptomatic  Goal status: INITIAL 04/26/23  Patient to demonstrate mild-moderate sway with M-CTSIB condition with eyes closed/foam surface in order to improve safety in environments with uneven surfaces and dim lighting. Baseline: mild-moderate 03/22/23  Goal status: MET 03/22/23   Patient to score at least 20/24 on DGI in order to decrease risk of falls. Baseline: 22/24 03/21/22 Goal status: MET 03/22/23  Patient to score at least 57 on FOTO in order to indicate improved functional outcomes.  Baseline: 45; 57.9902 04/26/23 Goal status: MET 04/26/23   ASSESSMENT:  CLINICAL IMPRESSION: Patient arrived to session with report of some improvement in dizziness since last week. Session today focused on reintroduction of head movements, turns, and bending for habituation. Most symptomatic with turns today, requiring short rest breaks. Better tolerance on 2nd trial after cueing to reduce pace slightly. Patient tolerated session well, reported understanding of HEP update, and without complaints upon leaving.   OBJECTIVE IMPAIRMENTS: Abnormal gait, decreased activity tolerance, decreased balance, decreased coordination, and dizziness.   ACTIVITY LIMITATIONS: carrying, lifting, bending, standing, squatting, sleeping, stairs, transfers, bed mobility, bathing, dressing, reach over head, and hygiene/grooming  PARTICIPATION LIMITATIONS: meal prep, cleaning, laundry, driving, shopping, community activity, yard work, and church  PERSONAL  FACTORS: Age, Behavior pattern, Past/current experiences, Time since onset of injury/illness/exacerbation, and 3+ comorbidities: Anemia, anxiety, asthma, depression, GERD, HTN, LBBB, pericarditis  are also affecting patient's functional outcome.   REHAB POTENTIAL: Good  CLINICAL DECISION MAKING: Evolving/moderate complexity  EVALUATION COMPLEXITY: Moderate   PLAN:  PT FREQUENCY: 1x/week  PT DURATION: 4 weeks  PLANNED INTERVENTIONS: 97164- PT Re-evaluation, 97110-Therapeutic exercises, 97530- Therapeutic activity, 97112- Neuromuscular re-education, 97535- Self Care, 46962- Manual therapy, [phone]- Gait training, [phone]- Canalith repositioning, Patient/Family education, Balance training, Stair training, Taping, Dry Needling, Joint mobilization, Spinal mobilization, Vestibular training, Cryotherapy, and Moist heat  PLAN FOR NEXT SESSION: continue working on quick head movements, progress to bending, reaching, turns; work on Press photographer and reducing fear avoidance behaviors   Baldemar Friday, [place], DPT 05/03/23 2:00 PM  [facility] at [facility] [address] [place], [place] [address] Phone # [phone] Fax # [phone]

## 2023-05-03 ENCOUNTER — Encounter: Payer: Self-pay | Admitting: Physical Therapy

## 2023-05-03 ENCOUNTER — Ambulatory Visit: Payer: Medicare PPO | Admitting: Physical Therapy

## 2023-05-03 DIAGNOSIS — R42 Dizziness and giddiness: Secondary | ICD-10-CM

## 2023-05-03 DIAGNOSIS — R2681 Unsteadiness on feet: Secondary | ICD-10-CM | POA: Diagnosis not present

## 2023-05-09 NOTE — Therapy (Signed)
 OUTPATIENT PHYSICAL THERAPY VESTIBULAR NOTE     Patient Name: Miranda Berger MRN: [id number] DOB:1955/03/29, 68 y.o., female Today's Date: 05/10/2023        END OF SESSION:  PT End of Session - 05/10/23 1356     Visit Number 7    Number of Visits 8    Date for PT Re-Evaluation 05/24/23    Authorization Type [employer]    Authorization Time Period approved 4 more PT visits from 04/26/2023-05/24/2023    Authorization - Visit Number 3    Authorization - Number of Visits 4    PT Start Time 1316    PT Stop Time 1355    PT Time Calculation (min) 39 min    Equipment Utilized During Treatment Gait belt    Activity Tolerance Patient tolerated treatment well    Behavior During Therapy [facility] for tasks assessed/performed                   Past Medical History:  Diagnosis Date   Abdominal pain 06/22/2012   Anemia    in the past    Anxiety    Aortic stenosis 06/20/2017   Asthma    Chronic interstitial cystitis 06/22/2012   Costochondral chest pain 04/04/2018   Degenerative tear of medial meniscus of right knee 04/21/2016   Depression    Difficult or painful urination 06/22/2012   Dyspareunia 06/22/2012   Family history of adverse reaction to anesthesia    mom had nausea   Female pelvic pain 06/22/2012   GERD (gastroesophageal reflux disease)    Hepatitis    Hepatitis A in 3rd grade?   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   Obstructive sleep apnea    Pericarditis 09/12/2017   PONV (postoperative nausea and vomiting)    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 07/26/2017   [employer], size 23   Urinary urgency 06/22/2012   Past  Surgical History:  Procedure Laterality Date   ABDOMINAL HYSTERECTOMY     AORTIC VALVE REPLACEMENT N/A 07/26/2017   Procedure: MINIMALLY INVASIVE AORTIC VALVE REPLACEMENT (AVR);  Surgeon: Purcell Nails, MD;  Location: [facility] OR;  Service: Open Heart Surgery;  Laterality: N/A;   BLADDER SURGERY  2011   bone spur removal Right 08/2022   great toe   CARDIAC CATHETERIZATION     CARPAL TUNNEL RELEASE  2012   CHOLECYSTECTOMY     RIGHT/LEFT HEART CATH AND CORONARY ANGIOGRAPHY N/A 07/08/2017   Procedure: RIGHT/LEFT HEART CATH AND CORONARY ANGIOGRAPHY;  Surgeon: Tonny Bollman, MD;  Location: [facility] INVASIVE CV LAB;  Service: Cardiovascular;  Laterality: N/A;   TEE WITHOUT CARDIOVERSION N/A 07/26/2017   Procedure: TRANSESOPHAGEAL ECHOCARDIOGRAM (TEE);  Surgeon: Purcell Nails, MD;  Location: [facility] OR;  Service: Open Heart Surgery;  Laterality: N/A;   TONSILLECTOMY     Patient Active Problem List   Diagnosis Date Noted   Depression 12/01/2021   Family history of adverse reaction to anesthesia 12/01/2021   GERD (gastroesophageal reflux disease) 12/01/2021   Hepatitis 12/01/2021   PONV (postoperative nausea and vomiting) 12/01/2021   History of hypertension 12/01/2021   LBBB (left bundle branch block) 08/08/2017   Obstructive sleep apnea    Anxiety    Asthma  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 07/26/2017   Aortic stenosis 06/20/2017   Degenerative tear of medial meniscus of right knee 04/21/2016   High-tone pelvic floor dysfunction 12/26/2013   Dyspareunia 06/22/2012    PCP: Farris Has, MD REFERRING PROVIDER: Farris Has, MD   REFERRING DIAG: R42 (ICD-10-CM) - Dizziness and giddiness  THERAPY DIAG:  Dizziness and giddiness  Unsteadiness on feet  ONSET DATE: 1 year  Rationale for Evaluation and Treatment: Rehabilitation  SUBJECTIVE:   SUBJECTIVE STATEMENT: Still getting everything organized at home. Reports "a lot of stress with it."  Reports that she has had a lot of HA's and neck pain and feels that it is possibly allergy or stress related.    Pt accompanied by: self  PERTINENT HISTORY: Anemia, anxiety, asthma, depression, GERD, HTN, LBBB, pericarditis  PAIN:  Are you having pain? Yes: NPRS scale: 3/10 Pain location: forehead Pain description: headache Aggravating factors: unsure Relieving factors: unsure  PRECAUTIONS: None  RED FLAGS: None   WEIGHT BEARING RESTRICTIONS: No  FALLS: Has patient fallen in last 6 months? No  LIVING ENVIRONMENT: Lives with: lives alone Lives in: House/apartment Stairs:  3-4 steps to enter; 1 story home  Has following equipment at home: None  PLOF: Independent; retired; likes reading, sewing, crafts   PATIENT GOALS: improve dizziness   OBJECTIVE:      TODAY'S TREATMENT: 05/10/23 Activity Comments  standing on foam + ball toss with visual tracking 2x30" each horizontal and vertical toss  Mild imbalance and tendency to lean posteriorly; no dizziness but c/o slightly increased headache.   Cervical stretching to address neck pain:  Sitting shoulder rolls fwd/back 10x each LS stretch 30" each  Shoulder shrugs 5x5" (focusing on muscle inhibition and scap depression) Heavy cueing to reduce guarding/tension   Self-STM to cervical and lumbar musculature  Reported good tolerance   gait + head turns/nods  Slight veering R; no dizziness   1/2 turns to targets + alt toe tap on cone No dizziness  1/2 turns to targets + bending down to touch cone C/o mild dizziness     PATIENT EDUCATION: Education details: edu on reassessment next week and likely DC Person educated: Patient Education method: Explanation Education comprehension: verbalized understanding    HOME EXERCISE PROGRAM Access Code: [email or web address] URL: [email or web address] Date: 05/03/2023 Prepared by: [facility] - Outpatient  Rehab - [facility]  Exercises - Standing with Head Nod  - 1 x daily -  5 x weekly - 2-3 sets - 30 sec hold - Standing with Head Rotation  - 1 x daily - 5 x weekly - 2-3 sets - 30 sec hold - Standing Diagonal Chops with Medicine Ball  - 1 x daily - 5 x weekly - 2 sets - 5 reps - 180 Degree Pivot Turn with Single Point Cane  - 1 x daily - 5 x weekly - 2 sets - 10 reps - Supine Lower Trunk Rotation  - 1 x daily - 5 x weekly - 2 sets - 10 reps - Supine Single Knee to Chest Stretch  - 1 x daily - 5 x weekly - 2 sets - 30 sec hold - Sidelying Thoracic Rotation with Open Book  - 1 x daily - 5 x weekly - 2 sets - 5-10 reps - Supine Piriformis Stretch with Foot on Ground  - 1 x daily - 5 x weekly - 2 sets - 30 sec hold     Note: Objective measures were completed at Evaluation  unless otherwise noted.  DIAGNOSTIC FINDINGS: 11/19/22 brain/AIC MRI:  No evidence of an acute intracranial abnormality.Mild multifocal T2 FLAIR hyperintense signal abnormality within the cerebral white matter, nonspecific but most often secondary to chronic small vessel ischemia. 5. There are a few chronic microhemorrhages scattered within the supratentorial brain and cerebellum. 6. Partially empty sella turcica. This finding can reflect incidental anatomic variation, or alternatively, it can be associated with chronic idiopathic intracranial hypertension   COGNITION: Overall cognitive status: Within functional limits for tasks assessed   SENSATION: Pt reports occasional UE N/T depending on sleeping position   COORDINATION:  Alternating pronation/supination: slow B Alternating toe tap: WNL B Finger to nose: WNL   POSTURE:  rounded shoulders, slightly forward head, guarded  GAIT: Gait pattern: Slightly unsteady, R-ward lean Assistive device utilized: None Level of assistance: Modified independence    PATIENT SURVEYS:  FOTO [phone]  VESTIBULAR ASSESSMENT:  GENERAL OBSERVATION: pt wears readers   OCULOMOTOR EXAM:  Ocular Alignment: normal  Ocular ROM: No  Limitations  Spontaneous Nystagmus: absent  Gaze-Induced Nystagmus: absent  Smooth Pursuits: saccades ~3 inferiorly; c/o hard to focus/woozy  Saccades: hypometric/undershoots slightly inferiorly   Convergence/Divergence: ~6 inches d/t L eye   VESTIBULAR - OCULAR REFLEX:   Slow VOR: Comment: small movements, especially reduced to R; no sx  VOR Cancellation: Normal  Head-Impulse Test: HIT Right: negative HIT Left: negative      POSITIONAL TESTING:  Right Roll Test: negative Left Roll Test: negative   Right Sidelying: negative; reports feeling "off" upon lying down and sitting up Left Sidelying: negative; reports feeling "off" upon lying down and sitting up but less intense   Right Dix-Hallpike: negative; reports feeling "off" upon lying down and sitting up; worse on this side  Left Dix-Hallpike:negative; reports feeling "off" upon lying down and sitting up                                                                                                                            TREATMENT DATE: 03/14/23   PATIENT EDUCATION: Education details: prognosis, POC, HEP Person educated: Patient Education method: Explanation, Demonstration, Tactile cues, Verbal cues, and Handouts Education comprehension: verbalized understanding and returned demonstration  HOME EXERCISE PROGRAM: Access Code: [email or web address] URL: [email or web address] Date: 03/14/2023 Prepared by: [facility] - Outpatient  Rehab - [facility]  Exercises - Brandt-Daroff Vestibular Exercise  - 1 x daily - 5 x weekly - 2 sets - 10 reps - 3-5 hold - Seated Horizontal Smooth Pursuit  - 1 x daily - 5 x weekly - 2 sets - 10 reps - Seated Vertical Smooth Pursuit  - 1 x daily - 5 x weekly - 2 sets - 10 reps   GOALS: Goals reviewed with patient? Yes  SHORT TERM GOALS: Target date: 04/04/2023  Patient to be independent with initial HEP. Baseline: HEP initiated Goal status: MET    LONG TERM GOALS: Target date:  05/24/2023  Patient to be independent with advanced HEP.  Baseline: Not yet initiated ; pt not currently performing d/t being busy with move 04/26/23 Goal status: IN PROGRESS 04/26/23  Patient to report 0/10 dizziness with quick head movements. Baseline: symptomatic; 4-5/10 dizziness 04/26/23 Goal status: IN PROGRESS 04/26/23  Patient will report 0/10 dizziness with bed mobility.  Baseline: Symptomatic ; no dizziness but reports some sinus pressure 04/26/23 Goal status: IN PROGRESS 04/26/23  Patient will report 0/10 dizziness with repeated bending and reaching activities.  Baseline: Symptomatic  Goal status: INITIAL 04/26/23  Patient to demonstrate mild-moderate sway with M-CTSIB condition with eyes closed/foam surface in order to improve safety in environments with uneven surfaces and dim lighting. Baseline: mild-moderate 03/22/23  Goal status: MET 03/22/23   Patient to score at least 20/24 on DGI in order to decrease risk of falls. Baseline: 22/24 03/21/22 Goal status: MET 03/22/23  Patient to score at least 57 on FOTO in order to indicate improved functional outcomes.  Baseline: 45; 57.9902 04/26/23 Goal status: MET 04/26/23   ASSESSMENT:  CLINICAL IMPRESSION: Patient arrived to session with report of stress surrounding current life circumstances. Reports increased HA's and neck pain recently and reports HA at start of session. Reported increase in HA after a few balance activities. Addressed pain with gentle cervical activities and self-STM which she reported benefit from. Balance and habituation activities today were only minimally symptomatic despite progression. Patient without complaints upon leaving.     OBJECTIVE IMPAIRMENTS: Abnormal gait, decreased activity tolerance, decreased balance, decreased coordination, and dizziness.   ACTIVITY LIMITATIONS: carrying, lifting, bending, standing, squatting, sleeping, stairs, transfers, bed mobility, bathing, dressing, reach over head, and  hygiene/grooming  PARTICIPATION LIMITATIONS: meal prep, cleaning, laundry, driving, shopping, community activity, yard work, and church  PERSONAL FACTORS: Age, Behavior pattern, Past/current experiences, Time since onset of injury/illness/exacerbation, and 3+ comorbidities: Anemia, anxiety, asthma, depression, GERD, HTN, LBBB, pericarditis  are also affecting patient's functional outcome.   REHAB POTENTIAL: Good  CLINICAL DECISION MAKING: Evolving/moderate complexity  EVALUATION COMPLEXITY: Moderate   PLAN:  PT FREQUENCY: 1x/week  PT DURATION: 4 weeks  PLANNED INTERVENTIONS: 97164- PT Re-evaluation, 97110-Therapeutic exercises, 97530- Therapeutic activity, 97112- Neuromuscular re-education, 97535- Self Care, 18841- Manual therapy, [phone]- Gait training, [phone]- Canalith repositioning, Patient/Family education, Balance training, Stair training, Taping, Dry Needling, Joint mobilization, Spinal mobilization, Vestibular training, Cryotherapy, and Moist heat  PLAN FOR NEXT SESSION: reassessment and likely DC; continue working on quick head movements, progress to bending, reaching, turns; work on Press photographer and reducing fear avoidance behaviors   Baldemar Friday, [place], DPT 05/10/23 1:57 PM  [facility] Re[facility] at [facility] [address] [place], [place] [address] Phone # [phone] Fax # [phone]

## 2023-05-10 ENCOUNTER — Ambulatory Visit: Admitting: Physical Therapy

## 2023-05-10 ENCOUNTER — Encounter: Payer: Self-pay | Admitting: Physical Therapy

## 2023-05-10 DIAGNOSIS — R42 Dizziness and giddiness: Secondary | ICD-10-CM

## 2023-05-10 DIAGNOSIS — R2681 Unsteadiness on feet: Secondary | ICD-10-CM | POA: Diagnosis not present

## 2023-05-12 DIAGNOSIS — T148XXA Other injury of unspecified body region, initial encounter: Secondary | ICD-10-CM | POA: Diagnosis not present

## 2023-05-16 NOTE — Therapy (Signed)
 OUTPATIENT PHYSICAL THERAPY VESTIBULAR NOTE     Patient Name: Miranda Berger MRN: [id number] DOB:08-17-1955, 68 y.o., female Today's Date: 05/16/2023        END OF SESSION:          Past Medical History:  Diagnosis Date   Abdominal pain 06/22/2012   Anemia    in the past    Anxiety    Aortic stenosis 06/20/2017   Asthma    Chronic interstitial cystitis 06/22/2012   Costochondral chest pain 04/04/2018   Degenerative tear of medial meniscus of right knee 04/21/2016   Depression    Difficult or painful urination 06/22/2012   Dyspareunia 06/22/2012   Family history of adverse reaction to anesthesia    mom had nausea   Female pelvic pain 06/22/2012   GERD (gastroesophageal reflux disease)    Hepatitis    Hepatitis A in 3rd grade?   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   Obstructive sleep apnea    Pericarditis 09/12/2017   PONV (postoperative nausea and vomiting)    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 07/26/2017   [employer], size 23   Urinary urgency 06/22/2012   Past Surgical History:  Procedure Laterality Date   ABDOMINAL HYSTERECTOMY     AORTIC VALVE REPLACEMENT N/A 07/26/2017   Procedure: MINIMALLY INVASIVE AORTIC VALVE REPLACEMENT (AVR);  Surgeon: Purcell Nails, MD;  Location: [facility] OR;  Service: Open Heart Surgery;  Laterality: N/A;   BLADDER SURGERY  2011   bone spur removal Right 08/2022   great toe   CARDIAC CATHETERIZATION     CARPAL TUNNEL RELEASE  2012   CHOLECYSTECTOMY     RIGHT/LEFT HEART CATH AND CORONARY ANGIOGRAPHY N/A 07/08/2017   Procedure: RIGHT/LEFT HEART CATH AND CORONARY ANGIOGRAPHY;  Surgeon: Tonny Bollman, MD;  Location: [facility] INVASIVE CV LAB;  Service: Cardiovascular;  Laterality: N/A;   TEE WITHOUT CARDIOVERSION N/A 07/26/2017   Procedure: TRANSESOPHAGEAL ECHOCARDIOGRAM (TEE);  Surgeon: Purcell Nails, MD;  Location: [facility] OR;  Service: Open Heart Surgery;  Laterality: N/A;   TONSILLECTOMY     Patient Active Problem List   Diagnosis Date Noted   Depression 12/01/2021   Family history of adverse reaction to anesthesia 12/01/2021   GERD (gastroesophageal reflux disease) 12/01/2021   Hepatitis 12/01/2021   PONV (postoperative nausea and vomiting) 12/01/2021   History of hypertension 12/01/2021   LBBB (left bundle branch block) 08/08/2017   Obstructive sleep apnea    Anxiety    Asthma    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 07/26/2017   Aortic stenosis 06/20/2017   Degenerative tear of medial meniscus of right knee 04/21/2016   High-tone pelvic floor dysfunction 12/26/2013   Dyspareunia 06/22/2012    PCP: Farris Has, MD REFERRING PROVIDER: Farris Has, MD   REFERRING DIAG: R42 (ICD-10-CM) - Dizziness and giddiness  THERAPY DIAG:  No diagnosis found.  ONSET DATE: 1 year  Rationale for Evaluation and Treatment: Rehabilitation  SUBJECTIVE:   SUBJECTIVE STATEMENT: Still getting everything organized at home. Reports "a lot of stress with it." Reports that she has had a lot of HA's and neck pain and feels that it is possibly allergy or stress related.    Pt accompanied by:  self  PERTINENT HISTORY: Anemia, anxiety, asthma, depression, GERD, HTN, LBBB, pericarditis  PAIN:  Are you having pain? Yes: NPRS scale: 3/10 Pain location: forehead Pain description: headache Aggravating factors: unsure Relieving factors: unsure  PRECAUTIONS: None  RED FLAGS: None   WEIGHT BEARING RESTRICTIONS: No  FALLS: Has patient fallen in last 6 months? No  LIVING ENVIRONMENT: Lives with: lives alone Lives in: House/apartment Stairs:  3-4  steps to enter; 1 story home  Has following equipment at home: None  PLOF: Independent; retired; likes reading, sewing, crafts   PATIENT GOALS: improve dizziness   OBJECTIVE:     TODAY'S TREATMENT: 05/17/23 Activity Comments                        TODAY'S TREATMENT: 05/10/23 Activity Comments  standing on foam + ball toss with visual tracking 2x30" each horizontal and vertical toss  Mild imbalance and tendency to lean posteriorly; no dizziness but c/o slightly increased headache.   Cervical stretching to address neck pain:  Sitting shoulder rolls fwd/back 10x each LS stretch 30" each  Shoulder shrugs 5x5" (focusing on muscle inhibition and scap depression) Heavy cueing to reduce guarding/tension   Self-STM to cervical and lumbar musculature  Reported good tolerance   gait + head turns/nods  Slight veering R; no dizziness   1/2 turns to targets + alt toe tap on cone No dizziness  1/2 turns to targets + bending down to touch cone C/o mild dizziness     PATIENT EDUCATION: Education details: edu on reassessment next week and likely DC Person educated: Patient Education method: Explanation Education comprehension: verbalized understanding    HOME EXERCISE PROGRAM Access Code: [email or web address] URL: [email or web address] Date: 05/03/2023 Prepared by: [facility] - Outpatient  Rehab - [facility]  Exercises - Standing with Head Nod  - 1 x daily - 5 x weekly - 2-3 sets - 30 sec hold - Standing with Head Rotation  - 1 x daily - 5 x weekly - 2-3 sets - 30 sec hold - Standing Diagonal Chops with Medicine Ball  - 1 x daily - 5 x weekly - 2 sets - 5 reps - 180 Degree Pivot Turn with Single Point Cane  - 1 x daily - 5 x weekly - 2 sets - 10 reps - Supine Lower Trunk Rotation  - 1 x daily - 5 x weekly - 2 sets - 10 reps - Supine Single Knee to Chest Stretch  - 1 x daily - 5 x weekly - 2 sets - 30 sec hold - Sidelying Thoracic Rotation with Open Book  - 1 x daily - 5 x  weekly - 2 sets - 5-10 reps - Supine Piriformis Stretch with Foot on Ground  - 1 x daily - 5 x weekly - 2 sets - 30 sec hold     Note: Objective measures were completed at Evaluation unless otherwise noted.  DIAGNOSTIC FINDINGS: 11/19/22 brain/AIC MRI:  No evidence of an acute intracranial abnormality.Mild multifocal T2 FLAIR hyperintense signal abnormality within the cerebral white matter, nonspecific but most often secondary to chronic small vessel ischemia. 5. There are a few chronic microhemorrhages scattered within the supratentorial brain and cerebellum. 6. Partially empty sella turcica. This finding can reflect incidental anatomic variation, or alternatively, it can be associated with chronic idiopathic intracranial hypertension   COGNITION: Overall cognitive status: Within functional limits for tasks assessed   SENSATION: Pt reports occasional UE N/T depending on sleeping position   COORDINATION:  Alternating pronation/supination: slow B Alternating toe tap: WNL B Finger to nose: WNL   POSTURE:  rounded shoulders, slightly forward head, guarded  GAIT: Gait pattern: Slightly unsteady, R-ward lean Assistive device utilized: None Level of assistance: Modified independence    PATIENT SURVEYS:  FOTO [phone]  VESTIBULAR ASSESSMENT:  GENERAL OBSERVATION: pt wears readers   OCULOMOTOR EXAM:  Ocular Alignment: normal  Ocular ROM: No Limitations  Spontaneous Nystagmus: absent  Gaze-Induced Nystagmus: absent  Smooth Pursuits: saccades ~3 inferiorly; c/o hard to focus/woozy  Saccades: hypometric/undershoots slightly inferiorly   Convergence/Divergence: ~6 inches d/t L eye   VESTIBULAR - OCULAR REFLEX:   Slow VOR: Comment: small movements, especially reduced to R; no sx  VOR Cancellation: Normal  Head-Impulse Test: HIT Right: negative HIT Left: negative      POSITIONAL TESTING:  Right Roll Test: negative Left Roll Test: negative   Right Sidelying:  negative; reports feeling "off" upon lying down and sitting up Left Sidelying: negative; reports feeling "off" upon lying down and sitting up but less intense   Right Dix-Hallpike: negative; reports feeling "off" upon lying down and sitting up; worse on this side  Left Dix-Hallpike:negative; reports feeling "off" upon lying down and sitting up                                                                                                                            TREATMENT DATE: 03/14/23   PATIENT EDUCATION: Education details: prognosis, POC, HEP Person educated: Patient Education method: Explanation, Demonstration, Tactile cues, Verbal cues, and Handouts Education comprehension: verbalized understanding and returned demonstration  HOME EXERCISE PROGRAM: Access Code: [email or web address] URL: [email or web address] Date: 03/14/2023 Prepared by: [facility] - Outpatient  Rehab - [facility]  Exercises - Brandt-Daroff Vestibular Exercise  - 1 x daily - 5 x weekly - 2 sets - 10 reps - 3-5 hold - Seated Horizontal Smooth Pursuit  - 1 x daily - 5 x weekly - 2 sets - 10 reps - Seated Vertical Smooth Pursuit  - 1 x daily - 5 x weekly - 2 sets - 10 reps   GOALS: Goals reviewed with patient? Yes  SHORT TERM GOALS: Target date: 04/04/2023  Patient to be independent with initial HEP. Baseline: HEP initiated Goal status: MET    LONG TERM GOALS: Target date: 05/24/2023  Patient to be independent with advanced HEP. Baseline: Not yet initiated ; pt not currently performing d/t being busy with move 04/26/23 Goal status: IN PROGRESS 04/26/23  Patient to report 0/10 dizziness with quick head movements. Baseline: symptomatic; 4-5/10 dizziness 04/26/23 Goal status: IN PROGRESS 04/26/23  Patient will report 0/10 dizziness with bed mobility.  Baseline: Symptomatic ; no dizziness but reports some sinus pressure 04/26/23 Goal status: IN PROGRESS 04/26/23  Patient will report 0/10 dizziness with  repeated bending and reaching activities.  Baseline: Symptomatic  Goal status: INITIAL 04/26/23  Patient to demonstrate mild-moderate sway with M-CTSIB condition with eyes closed/foam surface in order to improve  safety in environments with uneven surfaces and dim lighting. Baseline: mild-moderate 03/22/23  Goal status: MET 03/22/23   Patient to score at least 20/24 on DGI in order to decrease risk of falls. Baseline: 22/24 03/21/22 Goal status: MET 03/22/23  Patient to score at least 57 on FOTO in order to indicate improved functional outcomes.  Baseline: 45; 57.9902 04/26/23 Goal status: MET 04/26/23   ASSESSMENT:  CLINICAL IMPRESSION: Patient arrived to session with report of stress surrounding current life circumstances. Reports increased HA's and neck pain recently and reports HA at start of session. Reported increase in HA after a few balance activities. Addressed pain with gentle cervical activities and self-STM which she reported benefit from. Balance and habituation activities today were only minimally symptomatic despite progression. Patient without complaints upon leaving.     OBJECTIVE IMPAIRMENTS: Abnormal gait, decreased activity tolerance, decreased balance, decreased coordination, and dizziness.   ACTIVITY LIMITATIONS: carrying, lifting, bending, standing, squatting, sleeping, stairs, transfers, bed mobility, bathing, dressing, reach over head, and hygiene/grooming  PARTICIPATION LIMITATIONS: meal prep, cleaning, laundry, driving, shopping, community activity, yard work, and church  PERSONAL FACTORS: Age, Behavior pattern, Past/current experiences, Time since onset of injury/illness/exacerbation, and 3+ comorbidities: Anemia, anxiety, asthma, depression, GERD, HTN, LBBB, pericarditis  are also affecting patient's functional outcome.   REHAB POTENTIAL: Good  CLINICAL DECISION MAKING: Evolving/moderate complexity  EVALUATION COMPLEXITY: Moderate   PLAN:  PT FREQUENCY:  1x/week  PT DURATION: 4 weeks  PLANNED INTERVENTIONS: 97164- PT Re-evaluation, 97110-Therapeutic exercises, 97530- Therapeutic activity, 97112- Neuromuscular re-education, 97535- Self Care, 16109- Manual therapy, [phone]- Gait training, [id number]- Canalith repositioning, Patient/Family education, Balance training, Stair training, Taping, Dry Needling, Joint mobilization, Spinal mobilization, Vestibular training, Cryotherapy, and Moist heat  PLAN FOR NEXT SESSION: reassessment and likely DC; continue working on quick head movements, progress to bending, reaching, turns; work on Psychologist, forensic confidence and reducing fear avoidance behaviors   Baldemar Friday, [place], DPT 05/16/23 12:57 PM  [facility] at [facility] [address], [address] [place], [place] [address] Phone # [phone] Fax # [phone]

## 2023-05-17 ENCOUNTER — Ambulatory Visit: Admitting: Physical Therapy

## 2023-05-17 ENCOUNTER — Encounter: Payer: Self-pay | Admitting: Physical Therapy

## 2023-05-17 DIAGNOSIS — R42 Dizziness and giddiness: Secondary | ICD-10-CM | POA: Diagnosis not present

## 2023-05-17 DIAGNOSIS — R2681 Unsteadiness on feet: Secondary | ICD-10-CM | POA: Diagnosis not present

## 2023-06-16 ENCOUNTER — Encounter: Payer: Self-pay | Admitting: Podiatry

## 2023-06-16 ENCOUNTER — Ambulatory Visit: Payer: Medicare PPO | Admitting: Podiatry

## 2023-06-16 DIAGNOSIS — B351 Tinea unguium: Secondary | ICD-10-CM | POA: Diagnosis not present

## 2023-06-16 DIAGNOSIS — Z79899 Other long term (current) drug therapy: Secondary | ICD-10-CM | POA: Diagnosis not present

## 2023-06-16 NOTE — Patient Instructions (Signed)

## 2023-06-16 NOTE — Progress Notes (Signed)
 Subjective:   Patient ID: Miranda Berger, female   DOB: 68 y.o.   MRN: [id number]   HPI Chief Complaint  Patient presents with   Nail Problem    RM#13 Follow up on fungus treatment and routine nail care.     This is a-year-old female presents the office today for follow-up evaluation of nail fungus.  She tolerated the Lamisil  well without any side effects.  She states the topical medication as well.  No pain in the nails no swelling or redness or any drainage.  No open lesions.  No other concerns.     Objective:  Physical Exam  General: AAO x3, NAD  Dermatological: Nails are hypertrophic, dystrophic with yellow discoloration.  There is no hyperpigmentation.  No edema, erythema.  No open lesions.  There is some clearing of the proximal nail folds.  Vascular: Dorsalis Pedis artery and Posterior Tibial artery pedal pulses are 2/4 bilateral with immedate capillary fill time.  There is no pain with calf compression, swelling, warmth, erythema.   Neruologic: Grossly intact via light touch bilateral.   Musculoskeletal: No areas of discomfort today.  Gait: Unassisted, Nonantalgic.       Assessment:   Onychomycosis     Plan:  -Treatment options discussed including all alternatives, risks, and complications -Etiology of symptoms were discussed -Dystrophy of the nails with any complications or bleeding x 10. -Plan continue topical medications with oral medication.  Will likely extend Lamisil  for 30 more days.  Will recheck a CBC, LFT prior to extending the medication. - Consider adding laser treatment if needed  No follow-ups on file.  Charity Conch DPM   -We discussed treatment options medication, topical medication as well as alternative treatments.  On combined oral and topical after discussing risks, side effects and success rates.  Will check a CBC, LFT prior to starting the Lamisil .  Prescribed Jublia.  Also consider adding laser therapy if needed.  Charity Conch DPM

## 2023-06-17 LAB — CBC WITH DIFFERENTIAL/PLATELET
Basophils Absolute: 0.1 10*3/uL (ref 0.0–0.2)
Basos: 2 %
EOS (ABSOLUTE): 0.2 10*3/uL (ref 0.0–0.4)
Eos: 4 %
Hematocrit: 37.4 % (ref 34.0–46.6)
Hemoglobin: 12.5 g/dL (ref 11.1–15.9)
Immature Grans (Abs): 0 10*3/uL (ref 0.0–0.1)
Immature Granulocytes: 0 %
Lymphocytes Absolute: 1.9 10*3/uL (ref 0.7–3.1)
Lymphs: 35 %
MCH: 30 pg (ref 26.6–33.0)
MCHC: 33.4 g/dL (ref 31.5–35.7)
MCV: 90 fL (ref 79–97)
Monocytes Absolute: 0.6 10*3/uL (ref 0.1–0.9)
Monocytes: 12 %
Neutrophils Absolute: 2.6 10*3/uL (ref 1.4–7.0)
Neutrophils: 47 %
Platelets: 204 10*3/uL (ref 150–450)
RBC: 4.16 x10E6/uL (ref 3.77–5.28)
RDW: 12.3 % (ref 11.7–15.4)
WBC: 5.4 10*3/uL (ref 3.4–10.8)

## 2023-06-17 LAB — HEPATIC FUNCTION PANEL
ALT: 15 IU/L (ref 0–32)
AST: 16 IU/L (ref 0–40)
Albumin: 4.6 g/dL (ref 3.9–4.9)
Alkaline Phosphatase: 90 IU/L (ref 44–121)
Bilirubin Total: 0.2 mg/dL (ref 0.0–1.2)
Bilirubin, Direct: 0.11 mg/dL (ref 0.00–0.40)
Total Protein: 7.1 g/dL (ref 6.0–8.5)

## 2023-06-20 ENCOUNTER — Encounter: Payer: Self-pay | Admitting: Podiatry

## 2023-06-20 ENCOUNTER — Other Ambulatory Visit: Payer: Self-pay | Admitting: Podiatry

## 2023-06-20 MED ORDER — TERBINAFINE HCL 250 MG PO TABS: 250.0000 mg | ORAL_TABLET | Freq: Every day | ORAL | 0 refills | Status: DC

## 2023-06-23 ENCOUNTER — Other Ambulatory Visit: Payer: Self-pay | Admitting: Podiatry

## 2023-07-15 DIAGNOSIS — Z9849 Cataract extraction status, unspecified eye: Secondary | ICD-10-CM | POA: Diagnosis not present

## 2023-07-15 DIAGNOSIS — H524 Presbyopia: Secondary | ICD-10-CM | POA: Diagnosis not present

## 2023-07-15 DIAGNOSIS — Z961 Presence of intraocular lens: Secondary | ICD-10-CM | POA: Diagnosis not present

## 2023-07-15 DIAGNOSIS — H53143 Visual discomfort, bilateral: Secondary | ICD-10-CM | POA: Diagnosis not present

## 2023-07-24 ENCOUNTER — Encounter: Payer: Self-pay | Admitting: Emergency Medicine

## 2023-07-24 ENCOUNTER — Ambulatory Visit
Admission: EM | Admit: 2023-07-24 | Discharge: 2023-07-24 | Disposition: A | Discharge status: Home / Self Care | Attending: Family Medicine | Admitting: Family Medicine

## 2023-07-24 DIAGNOSIS — N3001 Acute cystitis with hematuria: Secondary | ICD-10-CM | POA: Diagnosis not present

## 2023-07-24 LAB — POCT URINALYSIS DIP (MANUAL ENTRY)
Bilirubin, UA: NEGATIVE
Glucose, UA: NEGATIVE mg/dL
Ketones, POC UA: NEGATIVE mg/dL
Nitrite, UA: POSITIVE — AB
Protein Ur, POC: >=300 mg/dL — AB
Spec Grav, UA: 1.025 (ref 1.010–1.025)
Urobilinogen, UA: 0.2 U/dL
pH, UA: 6.5 (ref 5.0–8.0)

## 2023-07-24 MED ORDER — PHENAZOPYRIDINE HCL 100 MG PO TABS: 100.0000 mg | ORAL_TABLET | Freq: Three times a day (TID) | ORAL | 0 refills | Status: DC | PRN

## 2023-07-24 MED ORDER — CEPHALEXIN 500 MG PO CAPS
500.0000 mg | ORAL_CAPSULE | Freq: Three times a day (TID) | ORAL | 0 refills | Status: AC
Start: 2023-07-24 — End: 2023-07-31

## 2023-07-24 NOTE — ED Triage Notes (Signed)
 Pt presents with lower abdominal cramping, hematuria and back pain since yesterday.

## 2023-07-24 NOTE — ED Provider Notes (Signed)
 [facility]    CSN: [id number] Arrival date & time: 07/24/23  0865      History   Chief Complaint Chief Complaint  Patient presents with   Back Pain   Abdominal Pain   Hematuria    HPI Miranda Berger is a 68 y.o. female presents for dysuria.  Patient reports 1 day of urinary burning, urgency, frequency, hematuria, lower abdominal pain and low back pain.  Denies any fevers, nausea/vomiting, flank pain.  No vaginal discharge or STD concern.  Denies history of recurrent UTIs.  No OTC treatments have been used since symptom onset.  No other concerns at this time.   Back Pain Associated symptoms: dysuria   Abdominal Pain Associated symptoms: dysuria and hematuria   Hematuria    Past Medical History:  Diagnosis Date   Abdominal pain 06/22/2012   Anemia    in the past    Anxiety    Aortic stenosis 06/20/2017   Asthma    Chronic interstitial cystitis 06/22/2012   Costochondral chest pain 04/04/2018   Degenerative tear of medial meniscus of right knee 04/21/2016   Depression    Difficult or painful urination 06/22/2012   Dyspareunia 06/22/2012   Family history of adverse reaction to anesthesia    mom had nausea   Female pelvic pain 06/22/2012   GERD (gastroesophageal reflux disease)    Hepatitis    Hepatitis A in 3rd grade?   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   Obstructive sleep apnea    Pericarditis 09/12/2017   PONV (postoperative nausea and vomiting)    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 07/26/2017   [employer], size 23   Urinary urgency 06/22/2012    Patient Active Problem List   Diagnosis Date Noted   Depression  12/01/2021   Family history of adverse reaction to anesthesia 12/01/2021   GERD (gastroesophageal reflux disease) 12/01/2021   Hepatitis 12/01/2021   PONV (postoperative nausea and vomiting) 12/01/2021   History of hypertension 12/01/2021   LBBB (left bundle branch block) 08/08/2017   Obstructive sleep apnea    Anxiety    Asthma    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 07/26/2017   Aortic stenosis 06/20/2017   Degenerative tear of medial meniscus of right knee 04/21/2016   High-tone pelvic floor dysfunction 12/26/2013   Dyspareunia 06/22/2012    Past Surgical History:  Procedure Laterality Date   ABDOMINAL HYSTERECTOMY     AORTIC VALVE REPLACEMENT N/A 07/26/2017   Procedure: MINIMALLY INVASIVE AORTIC VALVE REPLACEMENT (AVR);  Surgeon: Gardenia Jump, MD;  Location: [facility] OR;  Service: Open Heart Surgery;  Laterality: N/A;   BLADDER SURGERY  2011   bone spur removal Right 08/2022   great toe   CARDIAC CATHETERIZATION     CARPAL TUNNEL RELEASE  2012   CHOLECYSTECTOMY     RIGHT/LEFT HEART CATH AND CORONARY ANGIOGRAPHY N/A 07/08/2017   Procedure: RIGHT/LEFT HEART CATH AND CORONARY ANGIOGRAPHY;  Surgeon: Arnoldo Lapping, MD;  Location: [facility] INVASIVE CV LAB;  Service: Cardiovascular;  Laterality: N/A;   TEE WITHOUT CARDIOVERSION N/A 07/26/2017   Procedure: TRANSESOPHAGEAL ECHOCARDIOGRAM (TEE);  Surgeon: Gardenia Jump, MD;  Location: [facility] OR;  Service: Open Heart Surgery;  Laterality:  N/A;   TONSILLECTOMY      OB History   No obstetric history on file.      Home Medications    Prior to Admission medications   Medication Sig Start Date End Date Taking? Authorizing Provider  cephALEXin (KEFLEX) 500 MG capsule Take 1 capsule (500 mg total) by mouth 3 (three) times daily for 7 days. 07/24/23 07/31/23 Yes Sheniqua Carolan, Jodi R, NP  phenazopyridine (PYRIDIUM) 100 MG tablet Take 1 tablet (100 mg total) by mouth 3 (three) times daily as needed for pain. 07/24/23  Yes  Taelyn Nemes, Jodi R, NP  albuterol  (PROVENTIL  HFA;VENTOLIN  HFA) 108 (90 Base) MCG/ACT inhaler Inhale 2 puffs into the lungs every 6 (six) hours as needed for wheezing or shortness of breath.    [provider]  ALPRAZolam  (XANAX ) 0.5 MG tablet Take 0.25 mg by mouth in the morning and at bedtime.    [provider]  aspirin  EC 81 MG tablet Take 81 mg by mouth daily.    [provider]  buPROPion  (WELLBUTRIN  XL) 300 MG 24 hr tablet Take 1 tablet (300 mg total) by mouth daily. 07/18/18   Money, Christella Coventry, FNP  calcium  carbonate (OS-CAL) 600 MG TABS tablet Take 600 mg by mouth daily.     [provider]  cetirizine (ZYRTEC) 5 MG tablet Take 5 mg by mouth daily.    [provider]  fluticasone  (FLONASE ) 50 MCG/ACT nasal spray Place 1 spray into both nostrils daily as needed for allergies.     [provider]  Fluticasone -Salmeterol (ADVAIR) 250-50 MCG/DOSE AEPB Inhale 1 puff into the lungs 2 (two) times daily.    [provider]  mirtazapine  (REMERON ) 15 MG tablet Take 7.5 mg by mouth at bedtime.     [provider]  Multiple Vitamin (MULTIVITAMIN) capsule Take 1 capsule by mouth daily.    [provider]  Tavaborole  (KERYDIN ) 5 % SOLN Apply 1 drop topically 1 day or 1 dose. Apply 1 drop to the toenail daily. 03/18/23   Charity Conch, DPM  terbinafine  (LAMISIL ) 250 MG tablet Take 1 tablet (250 mg total) by mouth daily. 06/20/23   Charity Conch, DPM    Family History Family History  Problem Relation Age of Onset   Stroke Mother    Leukemia Mother    Stroke Father    Sleep apnea Father    Valvular heart disease Brother    Sleep apnea Brother    Heart Problems Maternal Aunt    Valvular heart disease Maternal Aunt    Valvular heart disease Paternal Grandfather     Social History Social History   Tobacco Use   Smoking status: Never   Smokeless tobacco: Never  Vaping Use   Vaping status: Never Used  Substance  Use Topics   Alcohol use: No    Alcohol/week: 0.0 standard drinks of alcohol   Drug use: No     Allergies   Codeine  and Prednisone    Review of Systems Review of Systems  Genitourinary:  Positive for dysuria and hematuria.     Physical Exam Triage Vital Signs ED Triage Vitals  Encounter Vitals Group     BP 07/24/23 0856 119/78     Systolic BP Percentile --      Diastolic BP Percentile --      Pulse Rate 07/24/23 0856 91     Resp 07/24/23 0856 16     Temp 07/24/23 0856 97.7 F (36.5 C)     Temp  Source 07/24/23 0856 Oral     SpO2 07/24/23 0856 96 %     Weight --      Height --      Head Circumference --      Peak Flow --      Pain Score 07/24/23 0855 4     Pain Loc --      Pain Education --      Exclude from Growth Chart --    No data found.  Updated Vital Signs BP 119/78 (BP Location: Left Arm)   Pulse 91   Temp 97.7 F (36.5 C) (Oral)   Resp 16   SpO2 96%   Visual Acuity Right Eye Distance:   Left Eye Distance:   Bilateral Distance:    Right Eye Near:   Left Eye Near:    Bilateral Near:     Physical Exam Vitals and nursing note reviewed.  Constitutional:      Appearance: Normal appearance.  HENT:     Head: Normocephalic and atraumatic.  Eyes:     Pupils: Pupils are equal, round, and reactive to light.  Cardiovascular:     Rate and Rhythm: Normal rate.  Pulmonary:     Effort: Pulmonary effort is normal.  Abdominal:     Tenderness: There is no right CVA tenderness or left CVA tenderness.  Skin:    General: Skin is warm and dry.  Neurological:     General: No focal deficit present.     Mental Status: She is alert and oriented to person, place, and time.  Psychiatric:        Mood and Affect: Mood normal.        Behavior: Behavior normal.      UC Treatments / Results  Labs (all labs ordered are listed, but only abnormal results are displayed) Labs Reviewed  POCT URINALYSIS DIP (MANUAL ENTRY) - Abnormal; Notable for the following  components:      Result Value   Color, UA straw (*)    Clarity, UA turbid (*)    Blood, UA large (*)    Protein Ur, POC >=300 (*)    Nitrite, UA Positive (*)    Leukocytes, UA Large (3+) (*)    All other components within normal limits  URINE CULTURE   Comprehensive metabolic panel Order: [id number]  Status: Edited Result - FINAL     Next appt: 08/16/2023 at 09:15 AM in Podiatry Charity Conch, DPM)   Test Result Released: Yes (seen)   0 Result Notes    Component Ref Range & Units (hover) 4 mo ago  EGFR 78.0  Comment: Abstracted by HIM  Calcium  9.4        Specimen Collected: 03/25/23 Last Resulted: 03/25/23 08:59   EKG   Radiology No results found.  Procedures Procedures (including critical care time)  Medications Ordered in UC Medications - No data to display  Initial Impression / Assessment and Plan / UC Course  I have reviewed the triage vital signs and the nursing notes.  Pertinent labs & imaging results that were available during my care of the patient were reviewed by me and considered in my medical decision making (see chart for details).     Reviewed exam and symptoms with patient.  No red flags.  UA positive for UTI, will culture and start Keflex.  Pyridium as needed.  Discussed rest fluids and PCP follow-up if symptoms do not improve.  ER precautions reviewed. Final Clinical Impressions(s) / UC Diagnoses  Final diagnoses:  Acute cystitis with hematuria     Discharge Instructions      Start flex 3 times a day for 7 days.  You may take Pyridium 3 times a day as needed for your urinary pain.  Please note this medication will make your urine orange.  The clinical contact you with results of the urine culture done today if positive.  Lots of rest and fluids.  Please follow-up with your PCP if your symptoms do not improve.  Please go to the ER if you develop any worsening symptoms.  Hope you feel better soon!   ED Prescriptions     Medication  Sig Dispense Auth. Provider   phenazopyridine (PYRIDIUM) 100 MG tablet Take 1 tablet (100 mg total) by mouth 3 (three) times daily as needed for pain. 6 tablet Mackena Plummer, Jodi R, NP   cephALEXin (KEFLEX) 500 MG capsule Take 1 capsule (500 mg total) by mouth 3 (three) times daily for 7 days. 21 capsule Tamika Shropshire, Jodi R, NP      PDMP not reviewed this encounter.   Alleen Arbour, NP 07/24/23 [phone]

## 2023-07-24 NOTE — Discharge Instructions (Signed)
 Start flex 3 times a day for 7 days.  You may take Pyridium 3 times a day as needed for your urinary pain.  Please note this medication will make your urine orange.  The clinical contact you with results of the urine culture done today if positive.  Lots of rest and fluids.  Please follow-up with your PCP if your symptoms do not improve.  Please go to the ER if you develop any worsening symptoms.  Hope you feel better soon!

## 2023-07-25 LAB — URINE CULTURE: Culture: <10000 — AB

## 2023-07-26 ENCOUNTER — Ambulatory Visit (HOSPITAL_COMMUNITY): Payer: Self-pay

## 2023-07-27 DIAGNOSIS — R300 Dysuria: Secondary | ICD-10-CM | POA: Diagnosis not present

## 2023-07-27 DIAGNOSIS — N390 Urinary tract infection, site not specified: Secondary | ICD-10-CM | POA: Diagnosis not present

## 2023-08-02 ENCOUNTER — Other Ambulatory Visit: Payer: Self-pay | Admitting: Podiatry

## 2023-08-04 DIAGNOSIS — R109 Unspecified abdominal pain: Secondary | ICD-10-CM | POA: Diagnosis not present

## 2023-08-16 ENCOUNTER — Ambulatory Visit: Admitting: Podiatry

## 2023-08-16 DIAGNOSIS — B351 Tinea unguium: Secondary | ICD-10-CM | POA: Diagnosis not present

## 2023-08-16 HISTORY — DX: Tinea unguium: B35.1

## 2023-08-16 NOTE — Progress Notes (Signed)
 Subjective:   Patient ID: Miranda Berger, female   DOB: 68 y.o.   MRN: [id number]   HPI Chief Complaint  Patient presents with   RFC    RM#11 RFC check on nails.      68 year old female presents the office today for follow-up evaluation of nail fungus.  She recently just finished a course of Lamisil  any side effects.  She feels the nails are doing much better overall however she has noticed the right second toenail still thickened discolored.  She still using topical medication as well.  No swelling or redness or drainage.      Objective:  Physical Exam  General: AAO x3, NAD  Dermatological: Nails are hypertrophic, dystrophic with yellow discoloration.  Overall the color is much improved.  The right second toenail still most hypertrophic and dystrophic with there is still some mild clearing on the proximal nail fold.  There is no edema, erythema or signs of infection of the toenail sites.    Vascular: Dorsalis Pedis artery and Posterior Tibial artery pedal pulses are 2/4 bilateral with immedate capillary fill time.  There is no pain with calf compression, swelling, warmth, erythema.   Neruologic: Grossly intact via light touch bilateral.   Musculoskeletal: No areas of discomfort today.  Gait: Unassisted, Nonantalgic.       Assessment:   Onychomycosis     Plan:  -Treatment options discussed including all alternatives, risks, and complications -Etiology of symptoms were discussed - Sharply debrided  nails with any complications or bleeding x 10 as a courtesy to the topical antifungal medication to work more effectively.  Will hold off on any more Lamisil  at this time but will reevaluate if symptoms persist or there is reoccurrence of the fungus.  Return in about 3 months (around 11/16/2023) for nail fungus .  Donnice JONELLE Fees DPM

## 2023-08-16 NOTE — Patient Instructions (Signed)
You can use UREA NAIL GEL on the thicker toenails

## 2023-08-22 DIAGNOSIS — J4530 Mild persistent asthma, uncomplicated: Secondary | ICD-10-CM | POA: Diagnosis not present

## 2023-08-22 DIAGNOSIS — H1045 Other chronic allergic conjunctivitis: Secondary | ICD-10-CM | POA: Diagnosis not present

## 2023-08-22 DIAGNOSIS — J3089 Other allergic rhinitis: Secondary | ICD-10-CM | POA: Diagnosis not present

## 2023-08-22 DIAGNOSIS — J300 Vasomotor rhinitis: Secondary | ICD-10-CM | POA: Diagnosis not present

## 2023-09-20 DIAGNOSIS — J3089 Other allergic rhinitis: Secondary | ICD-10-CM | POA: Diagnosis not present

## 2023-09-20 DIAGNOSIS — H1045 Other chronic allergic conjunctivitis: Secondary | ICD-10-CM | POA: Diagnosis not present

## 2023-09-20 DIAGNOSIS — J4530 Mild persistent asthma, uncomplicated: Secondary | ICD-10-CM | POA: Diagnosis not present

## 2023-09-20 DIAGNOSIS — J209 Acute bronchitis, unspecified: Secondary | ICD-10-CM | POA: Diagnosis not present

## 2023-10-11 DIAGNOSIS — H18413 Arcus senilis, bilateral: Secondary | ICD-10-CM | POA: Diagnosis not present

## 2023-10-11 DIAGNOSIS — H04123 Dry eye syndrome of bilateral lacrimal glands: Secondary | ICD-10-CM | POA: Diagnosis not present

## 2023-10-11 DIAGNOSIS — Z961 Presence of intraocular lens: Secondary | ICD-10-CM | POA: Diagnosis not present

## 2023-10-11 DIAGNOSIS — H26493 Other secondary cataract, bilateral: Secondary | ICD-10-CM | POA: Diagnosis not present

## 2023-10-13 ENCOUNTER — Encounter (HOSPITAL_COMMUNITY): Payer: Self-pay | Admitting: Emergency Medicine

## 2023-10-13 ENCOUNTER — Emergency Department (HOSPITAL_COMMUNITY)
Admission: EM | Admit: 2023-10-13 | Discharge: 2023-10-14 | Disposition: A | Discharge status: Home / Self Care | Attending: Emergency Medicine | Admitting: Emergency Medicine

## 2023-10-13 DIAGNOSIS — Z79899 Other long term (current) drug therapy: Secondary | ICD-10-CM | POA: Insufficient documentation

## 2023-10-13 DIAGNOSIS — Z7982 Long term (current) use of aspirin: Secondary | ICD-10-CM | POA: Insufficient documentation

## 2023-10-13 DIAGNOSIS — F430 Acute stress reaction: Secondary | ICD-10-CM | POA: Insufficient documentation

## 2023-10-13 DIAGNOSIS — F333 Major depressive disorder, recurrent, severe with psychotic symptoms: Secondary | ICD-10-CM

## 2023-10-13 DIAGNOSIS — R030 Elevated blood-pressure reading, without diagnosis of hypertension: Secondary | ICD-10-CM | POA: Diagnosis not present

## 2023-10-13 DIAGNOSIS — F331 Major depressive disorder, recurrent, moderate: Secondary | ICD-10-CM | POA: Diagnosis not present

## 2023-10-13 DIAGNOSIS — F419 Anxiety disorder, unspecified: Secondary | ICD-10-CM | POA: Diagnosis not present

## 2023-10-13 DIAGNOSIS — R0789 Other chest pain: Secondary | ICD-10-CM | POA: Diagnosis not present

## 2023-10-13 DIAGNOSIS — I10 Essential (primary) hypertension: Secondary | ICD-10-CM | POA: Diagnosis not present

## 2023-10-13 LAB — COMPREHENSIVE METABOLIC PANEL WITH GFR
ALT: 22 U/L (ref 0–44)
AST: 23 U/L (ref 15–41)
Albumin: 4.4 g/dL (ref 3.5–5.0)
Alkaline Phosphatase: 56 U/L (ref 38–126)
Anion gap: 14 (ref 5–15)
BUN: 16 mg/dL (ref 8–23)
CO2: 24 mmol/L (ref 22–32)
Calcium: 8.5 mg/dL — ABNORMAL LOW (ref 8.9–10.3)
Chloride: 105 mmol/L (ref 98–111)
Creatinine, Ser: 0.96 mg/dL (ref 0.44–1.00)
GFR, Estimated: >60 mL/min (ref >60)
Glucose, Bld: 112 mg/dL — ABNORMAL HIGH (ref 70–99)
Potassium: 4.4 mmol/L (ref 3.5–5.1)
Sodium: 143 mmol/L (ref 135–145)
Total Bilirubin: 0.7 mg/dL (ref 0.0–1.2)
Total Protein: 7.3 g/dL (ref 6.5–8.1)

## 2023-10-13 LAB — RAPID URINE DRUG SCREEN, HOSP PERFORMED
Amphetamines: NOT DETECTED
Barbiturates: NOT DETECTED
Benzodiazepines: POSITIVE — AB
Cocaine: NOT DETECTED
Opiates: NOT DETECTED
Tetrahydrocannabinol: NOT DETECTED

## 2023-10-13 LAB — CBC
HCT: 36.7 % (ref 36.0–46.0)
Hemoglobin: 12.5 g/dL (ref 12.0–15.0)
MCH: 30.9 pg (ref 26.0–34.0)
MCHC: 34.1 g/dL (ref 30.0–36.0)
MCV: 90.8 fL (ref 80.0–100.0)
Platelets: 225 10*3/uL (ref 150–400)
RBC: 4.04 MIL/uL (ref 3.87–5.11)
RDW: 12.6 % (ref 11.5–15.5)
WBC: 8.3 10*3/uL (ref 4.0–10.5)
nRBC: 0 % (ref 0.0–0.2)

## 2023-10-13 LAB — TROPONIN I (HIGH SENSITIVITY)
Troponin I (High Sensitivity): 4 ng/L
Troponin I (High Sensitivity): 4 ng/L (ref <18)

## 2023-10-13 LAB — ETHANOL: Alcohol, Ethyl (B): <15 mg/dL

## 2023-10-13 NOTE — ED Provider Notes (Addendum)
 [place] EMERGENCY DEPARTMENT AT [facility] Provider Note   CSN: [id number] Arrival date & time: 10/13/23  1312     Patient presents with: Anxiety and Chest Pain   Miranda Berger is a 68 y.o. female.   Pt c/o feeling anxious, shaky, in past several weeks. Indicates has stress related to many ongoing issues, and as relates spouses health - not necessarily new stressors but is feeling more anxiety and stress about these issues. Indicates will get anxious, upset and/or worry about it more, but will be able to calm self. Friend indicates it is now interfering with her ability to focus on daily activities - seems more distracted.  Pt indicates saaw her counselor/therapist, Darice Molt NP with [employer] today and was told to go to ER. Pt denies any recent change in her meds, but feels they are not helping much. Had mild chest tightness in past day, related to anxiety, not related to exertion, not pleuritic, without associated sob, nv or diaphoresis.  Is eating/drinking, no wt loss. Some trouble sleeping at night. Tearful more frequent than normal.   The history is provided by the patient, a friend and medical records.  Anxiety Associated symptoms include chest pain. Pertinent negatives include no abdominal pain and no shortness of breath.  Chest Pain Associated symptoms: anxiety   Associated symptoms: no abdominal pain, no back pain, no cough, no fever, no numbness, no palpitations, no shortness of breath, no vomiting and no weakness        Prior to Admission medications   Medication Sig Start Date End Date Taking? Authorizing Provider  acetaminophen  (TYLENOL ) 500 MG tablet Take 1,000 mg by mouth every 8 (eight) hours as needed.   Yes [provider]  albuterol  (PROVENTIL  HFA;VENTOLIN  HFA) 108 (90 Base) MCG/ACT inhaler Inhale 2 puffs into the lungs every 6 (six) hours as needed for wheezing or shortness of breath.   Yes [provider]  ALPRAZolam   (XANAX ) 0.5 MG tablet Take 0.5 mg by mouth in the morning and at bedtime.   Yes [provider]  aspirin  EC 81 MG tablet Take 81 mg by mouth daily.   Yes [provider]  buPROPion  (WELLBUTRIN  XL) 300 MG 24 hr tablet Take 1 tablet (300 mg total) by mouth daily. 07/18/18  Yes Money, Caron NOVAK, FNP  calcium  carbonate (OS-CAL) 600 MG TABS tablet Take 600 mg by mouth daily.    Yes [provider]  cyanocobalamin  (VITAMIN B12) 500 MCG tablet Take 500 mcg by mouth daily.   Yes [provider]  fluticasone  (FLONASE ) 50 MCG/ACT nasal spray Place 1 spray into both nostrils daily as needed for allergies.    Yes [provider]  Fluticasone -Salmeterol (ADVAIR) 250-50 MCG/DOSE AEPB Inhale 1 puff into the lungs 2 (two) times daily.   Yes [provider]  levocetirizine (XYZAL) 5 MG tablet Take 5 mg by mouth daily. 09/20/23  Yes [provider]  mirtazapine  (REMERON ) 15 MG tablet Take 15 mg by mouth at bedtime.   Yes [provider]  montelukast (SINGULAIR) 10 MG tablet Take 10 mg by mouth daily. 09/20/23  Yes [provider]  Multiple Vitamin (MULTIVITAMIN) capsule Take 1 capsule by mouth daily.   Yes [provider]  omeprazole (PRILOSEC) 40 MG capsule Take 40 mg by mouth daily. 09/20/23  Yes [provider]  prednisoLONE acetate (PRED FORTE) 1 % ophthalmic suspension 1 drop 4 (four) times daily. 10/11/23  Yes [provider]  saccharomyces  boulardii (FLORASTOR) 250 MG capsule Take 250 mg by mouth daily.   Yes [provider]    Allergies: Codeine  and Prednisone     Review of Systems  Constitutional:  Negative for chills and fever.  Eyes:  Negative for visual disturbance.  Respiratory:  Negative for cough and shortness of breath.   Cardiovascular:  Positive for chest pain. Negative for palpitations and leg swelling.  Gastrointestinal:  Negative for abdominal pain, diarrhea and vomiting.   Genitourinary:  Negative for flank pain.  Musculoskeletal:  Negative for back pain and neck pain.  Neurological:  Negative for speech difficulty, weakness and numbness.  Psychiatric/Behavioral:  Positive for dysphoric mood. Negative for suicidal ideas. The patient is nervous/anxious.     Updated Vital Signs BP (!) 157/101 (BP Location: Right Arm)   Pulse 90   Temp 98.3 F (36.8 C)   Resp 16   SpO2 98%   Physical Exam Vitals and nursing note reviewed.  Constitutional:      Appearance: Normal appearance. She is well-developed.  HENT:     Head: Atraumatic.     Nose: Nose normal.     Mouth/Throat:     Mouth: Mucous membranes are moist.  Eyes:     General: No scleral icterus.    Conjunctiva/sclera: Conjunctivae normal.     Pupils: Pupils are equal, round, and reactive to light.  Neck:     Trachea: No tracheal deviation.     Comments: Trachea midline, thyroid not grossly enlarged or tender.  Cardiovascular:     Rate and Rhythm: Normal rate and regular rhythm.     Pulses: Normal pulses.     Heart sounds: Normal heart sounds. No murmur heard.    No friction rub. No gallop.  Pulmonary:     Effort: Pulmonary effort is normal. No respiratory distress.     Breath sounds: Normal breath sounds.  Abdominal:     General: Bowel sounds are normal. There is no distension.     Palpations: Abdomen is soft.     Tenderness: There is no abdominal tenderness. There is no guarding.  Genitourinary:    Comments: No cva tenderness.  Musculoskeletal:        General: No swelling or tenderness.     Cervical back: Normal range of motion and neck supple. No rigidity. No muscular tenderness.     Right lower leg: No edema.     Left lower leg: No edema.  Skin:    General: Skin is warm and dry.     Findings: No rash.  Neurological:     Mental Status: She is alert.     Comments: Alert, speech normal. Motor/sens grossly intact bil.   Psychiatric:     Comments: Mildly anxious.  No thoughts of harm  to self/others. No psychosis noted.      (all labs ordered are listed, but only abnormal results are displayed) Results for orders placed or performed during the hospital encounter of 10/13/23  Comprehensive metabolic panel   Collection Time: 10/13/23  1:47 PM  Result Value Ref Range   Sodium 143 135 - 145 mmol/L   Potassium 4.4 3.5 - 5.1 mmol/L   Chloride 105 98 - 111 mmol/L   CO2 24 22 - 32 mmol/L   Glucose, Bld 112 (H) 70 - 99 mg/dL   BUN 16 8 - 23 mg/dL   Creatinine, Ser 9.03 0.44 - 1.00 mg/dL   Calcium  8.5 (L) 8.9 - 10.3 mg/dL   Total Protein 7.3 6.5 -  8.1 g/dL   Albumin  4.4 3.5 - 5.0 g/dL   AST 23 15 - 41 U/L   ALT 22 0 - 44 U/L   Alkaline Phosphatase 56 38 - 126 U/L   Total Bilirubin 0.7 0.0 - 1.2 mg/dL   GFR, Estimated >39 >39 mL/min   Anion gap 14 5 - 15  Ethanol   Collection Time: 10/13/23  1:47 PM  Result Value Ref Range   Alcohol, Ethyl (B) <15 <15 mg/dL  cbc   Collection Time: 10/13/23  1:47 PM  Result Value Ref Range   WBC 8.3 4.0 - 10.5 K/uL   RBC 4.04 3.87 - 5.11 MIL/uL   Hemoglobin 12.5 12.0 - 15.0 g/dL   HCT 63.2 63.9 - 53.9 %   MCV 90.8 80.0 - 100.0 fL   MCH 30.9 26.0 - 34.0 pg   MCHC 34.1 30.0 - 36.0 g/dL   RDW 87.3 88.4 - 84.4 %   Platelets 225 150 - 400 K/uL   nRBC 0.0 0.0 - 0.2 %  Rapid urine drug screen (hospital performed)   Collection Time: 10/13/23  1:47 PM  Result Value Ref Range   Opiates NONE DETECTED NONE DETECTED   Cocaine NONE DETECTED NONE DETECTED   Benzodiazepines POSITIVE (A) NONE DETECTED   Amphetamines NONE DETECTED NONE DETECTED   Tetrahydrocannabinol NONE DETECTED NONE DETECTED   Barbiturates NONE DETECTED NONE DETECTED  Troponin I (High Sensitivity)   Collection Time: 10/13/23  1:47 PM  Result Value Ref Range   Troponin I (High Sensitivity) 4 <18 ng/L  Troponin I (High Sensitivity)   Collection Time: 10/13/23  4:19 PM  Result Value Ref Range   Troponin I (High Sensitivity) 4 <18 ng/L    EKG: EKG  Interpretation Date/Time:  Thursday October 13 2023 13:22:53 EDT Ventricular Rate:  98 PR Interval:  156 QRS Duration:  74 QT Interval:  352 QTC Calculation: 449 R Axis:   14  Text Interpretation: Normal sinus rhythm Nonspecific ST abnormality Confirmed by Bernard Drivers ([id number]) on 10/13/2023 4:28:54 PM  Radiology: No results found.   Procedures   Medications Ordered in the ED - No data to display                                  Medical Decision Making Problems Addressed: Acute stress reaction with predominately emotional disturbance: acute illness or injury with systemic symptoms Anxiety: acute illness or injury    Details: Acute/chronic Atypical chest pain: acute illness or injury with systemic symptoms Elevated blood pressure reading: acute illness or injury  Amount and/or Complexity of Data Reviewed Independent Historian: friend    Details: hx External Data Reviewed: notes. Labs: ordered. Decision-making details documented in ED Course. ECG/medicine tests: ordered and independent interpretation performed. Decision-making details documented in ED Course. Discussion of management or test interpretation with external provider(s): BH team  Risk Decision regarding hospitalization.   Iv ns. Continuous pulse ox and cardiac monitoring. Labs ordered/sent.   Differential diagnosis includes acs, msk cp, gi cp, anxiety/stress rxn, depression, etc. Dispo decision including potential need for admission considered - will get labs and reassess.   Reviewed nursing notes and prior charts for additional history. External reports reviewed. Additional history from: friend.  Remote hx tavr. Prior cardiac cath without significant cad.   Cardiac monitor: sinus rhythm, rate 88.  Labs reviewed/interpreted by me - wbc and hgb normal. Chem unremarkable. Trop normal.   Pt/friend indicates  sent for [facility] eval - TTS consult ordered.   BH eval pending - inquired re delay - indicates will be IRIS  eval.   2315 IRIS eval and recommendations remain pending.  Dispo/plan per [facility] team.   2353 iris recommendations pending - signed out to oncoming EDP, Dr Lorette.         Final diagnoses:  None    ED Discharge Orders     None         Bernard Drivers, MD 10/13/23 2353

## 2023-10-13 NOTE — ED Provider Notes (Signed)
 11:56 PM Assumed care from Dr. Bernard, please see their note for full history, physical and decision making until this point. In brief this is a 68 y.o. year old female who presented to the ED tonight with Anxiety and Chest Pain     H/o anxiety/depression at baseline. Worsening. Pending IRIS recommendations.   IRIS offered admission, patient declined. Patient states she feels safe for d/c, is going home with son. Has multiple outpatient recources. IRIS recommending 12.5 nightly of seroquel . Patient agreeable. Contracts for safety.   Discharge instructions, including strict return precautions for new or worsening symptoms, given. Patient and/or family verbalized understanding and agreement with the plan as described.   Labs, studies and imaging reviewed by myself and considered in medical decision making if ordered. Imaging interpreted by radiology.  Labs Reviewed  COMPREHENSIVE METABOLIC PANEL WITH GFR - Abnormal; Notable for the following components:      Result Value   Glucose, Bld 112 (*)    Calcium  8.5 (*)    All other components within normal limits  RAPID URINE DRUG SCREEN, HOSP PERFORMED - Abnormal; Notable for the following components:   Benzodiazepines POSITIVE (*)    All other components within normal limits  ETHANOL  CBC  TROPONIN I (HIGH SENSITIVITY)  TROPONIN I (HIGH SENSITIVITY)    No orders to display    No follow-ups on file.    Sabino Denning, Selinda, MD 10/14/23 [phone]

## 2023-10-13 NOTE — ED Triage Notes (Signed)
 Patient checking in for anxiety causing headaches, chest pain, and tremors. Patient reports a lot of stress in her life and is having a hard time managing it and PCP wanted her evaluated. Patient states to RN that she has no thoughts of SI or HI that she has too much to live for.
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
## 2023-10-13 NOTE — ED Triage Notes (Signed)
 Pt reports tremors, anxiety and not feeling safe at home. States she has not been sleeping well and anxious. Pt on anxiety and depression meds with no changes to that. Pt reports to some hallucinations.

## 2023-10-13 NOTE — Consult Note (Incomplete)
 [facility] Consult Note  Patient Name: Miranda Berger MRN: [id number] DOB: 1955-06-09 DATE OF Consult: 10/13/2023  PRIMARY PSYCHIATRIC DIAGNOSES  1.  *** 2.  *** 3.  ***  RECOMMENDATIONS  {Recommendations:304550007::Medication recommendations: ***,Non-Medication/therapeutic recommendations: ***,Communication: Treatment team members (and family members if applicable) who were involved in treatment/care discussions and planning, and with whom we spoke or engaged with via secure text/chat, include the following: ***}  Thank you for involving us  in the care of this patient. If you have any additional questions or concerns, please call [phone] and ask for me or the provider on-call.  TELEPSYCHIATRY ATTESTATION & CONSENT  As the provider for this telehealth consult, I attest that I verified the patient's identity using two separate identifiers, introduced myself to the patient, provided my credentials, disclosed my location, and performed this encounter via a HIPAA-compliant, real-time, face-to-face, two-way, interactive audio and video platform and with the full consent and agreement of the patient (or guardian as applicable.)  Patient physical location: ***. Telehealth provider physical location: home office in state of ***.  Video start time: *** (Central Time) Video end time: *** (Central Time)  IDENTIFYING DATA  Miranda Berger is a 68 y.o. year-old female for whom a psychiatric consultation has been ordered by the primary provider. The patient was identified using two separate identifiers.  CHIEF COMPLAINT/REASON FOR CONSULT  ***  HISTORY OF PRESENT ILLNESS (HPI)  The patient ***.  PAST PSYCHIATRIC HISTORY  *** Otherwise as per HPI above.  PAST MEDICAL HISTORY  Past Medical History:  Diagnosis Date  . Abdominal pain 06/22/2012  . Anemia    in the past   . Anxiety   . Aortic stenosis 06/20/2017  . Asthma   . Chronic interstitial cystitis 06/22/2012  .  Costochondral chest pain 04/04/2018  . Degenerative tear of medial meniscus of right knee 04/21/2016  . Depression   . Difficult or painful urination 06/22/2012  . Dyspareunia 06/22/2012  . Family history of adverse reaction to anesthesia    mom had nausea  . Female pelvic pain 06/22/2012  . GERD (gastroesophageal reflux disease)   . Hepatitis    Hepatitis A in 3rd grade?  . High-tone pelvic floor dysfunction 12/26/2013  . History of hypertension 12/01/2021  . Hypertension   . LBBB (left bundle branch block) 08/08/2017  . 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 Obstructive sleep apnea   . Pericarditis 09/12/2017  . PONV (postoperative nausea and vomiting)   . 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 Urinary urgency 06/22/2012   ***  HOME MEDICATIONS  PTA Medications  Medication Sig  . levocetirizine (XYZAL) 5 MG tablet Take 5 mg by mouth every evening.  . montelukast (SINGULAIR) 10 MG tablet Take 10 mg by mouth at bedtime.  SABRA omeprazole (PRILOSEC) 40 MG capsule Take 40 mg by mouth daily.  . prednisoLONE acetate (PRED FORTE) 1 % ophthalmic suspension 1 drop 4 (four) times daily.  . fluticasone  (FLONASE ) 50 MCG/ACT nasal spray Place 1 spray into both nostrils daily as needed for allergies.   . ALPRAZolam  (XANAX ) 0.5 MG tablet Take 0.25 mg by mouth in the morning and at bedtime.  . Multiple Vitamin (MULTIVITAMIN) capsule Take 1 capsule by mouth daily.  . calcium  carbonate (OS-CAL) 600 MG TABS tablet Take 600 mg by mouth daily.   SABRA  Fluticasone -Salmeterol (ADVAIR) 250-50 MCG/DOSE AEPB Inhale 1 puff into the lungs 2 (two) times daily.  . mirtazapine  (REMERON ) 15 MG tablet Take 7.5 mg by mouth at bedtime.   . albuterol  (PROVENTIL  HFA;VENTOLIN  HFA) 108 (90 Base) MCG/ACT inhaler  Inhale 2 puffs into the lungs every 6 (six) hours as needed for wheezing or shortness of breath.  . aspirin  EC 81 MG tablet Take 81 mg by mouth daily.  . buPROPion  (WELLBUTRIN  XL) 300 MG 24 hr tablet Take 1 tablet (300 mg total) by mouth daily.  . cetirizine (ZYRTEC) 5 MG tablet Take 5 mg by mouth daily.  . Tavaborole  (KERYDIN ) 5 % SOLN Apply 1 drop topically 1 day or 1 dose. Apply 1 drop to the toenail daily.  . terbinafine  (LAMISIL ) 250 MG tablet Take 1 tablet (250 mg total) by mouth daily.  . phenazopyridine  (PYRIDIUM ) 100 MG tablet Take 1 tablet (100 mg total) by mouth 3 (three) times daily as needed for pain.   ***  ALLERGIES  Allergies  Allergen Reactions  . Codeine  Itching  . Prednisone  Itching    SOCIAL & SUBSTANCE USE HISTORY  Social History   Socioeconomic History  . Marital status: Married    Spouse name: Not on file  . Number of children: 1  . Years of education: BS  . Highest education level: Not on file  Occupational History  . Not on file  Tobacco Use  . Smoking status: Never  . Smokeless tobacco: Never  Vaping Use  . Vaping status: Never Used  Substance and Sexual Activity  . Alcohol use: No    Alcohol/week: 0.0 standard drinks of alcohol  . Drug use: No  . Sexual activity: Not on file  Other Topics Concern  . Not on file  Social History Narrative   Occasionally consumes tea or coffee   Social Drivers of Corporate investment banker Strain: Not on file  Food Insecurity: No Food Insecurity (10/06/2017)   Hunger Vital Sign   . Worried About Programme researcher, broadcasting/film/video in the Last Year: Never true   . Ran Out of Food in the Last Year: Never true  Transportation Needs: No Transportation Needs (10/06/2017)   PRAPARE - Transportation   . Lack of Transportation (Medical): No   . Lack of Transportation (Non-Medical): No  Physical Activity: Inactive (10/06/2017)   Exercise Vital Sign   . Days of Exercise per Week: 0 days   . Minutes of Exercise per Session: 0 min   Stress: Stress Concern Present (10/06/2017)   [employer] of Occupational Health - Occupational Stress Questionnaire   . Feeling of Stress : Rather much  Social Connections: Not on file   Social History   Tobacco Use  Smoking Status Never  Smokeless Tobacco Never   Social History   Substance and Sexual Activity  Alcohol Use No  . Alcohol/week: 0.0 standard drinks of alcohol   Social History   Substance and Sexual Activity  Drug Use No    Additional pertinent information ***.  FAMILY HISTORY  Family History  Problem Relation Age of Onset  . Stroke Mother   . Leukemia Mother   . Stroke Father   . Sleep apnea Father   . Valvular heart disease Brother   . Sleep apnea Brother   . Heart Problems Maternal Aunt   . Valvular heart disease Maternal Aunt   . Valvular heart disease Paternal Grandfather    Family Psychiatric History (if known):  ***  MENTAL STATUS EXAM (  MSE)  Mental Status Exam: General Appearance: {Appearance:22683}  Orientation:  {BHH ORIENTATION (PAA):22689}  Memory:  {BHH MEMORY:22881}  Concentration:  {Concentration:21399}  Recall:  {BHH GOOD/FAIR/POOR:22877}  Attention  {BH Attention Span:31825}  Eye Contact:  {BHH EYE CONTACT:22684}  Speech:  {Speech:22685}  Language:  {BHH GOOD/FAIR/POOR:22877}  Volume:  {Volume (PAA):22686}  Mood: ***  Affect:  {Affect (PAA):22687}  Thought Process:  {Thought Process (PAA):22688}  Thought Content:  {Thought Content:22690}  Suicidal Thoughts:  {ST/HT (PAA):22692}  Homicidal Thoughts:  {ST/HT (PAA):22692}  Judgement:  {Judgement (PAA):22694}  Insight:  {Insight (PAA):22695}  Psychomotor Activity:  {Psychomotor (PAA):22696}  Akathisia:  {BHH YES OR NO:22294}  Fund of Knowledge:  {BHH GOOD/FAIR/POOR:22877}    Assets:  {Assets (PAA):22698}  Cognition:  {chl bhh cognition:304700322}  ADL's:  {BHH JIO'D:77709}  AIMS (if indicated):       VITALS  Blood pressure (!) 157/101, pulse 90, temperature 98.3 F  (36.8 C), resp. rate 16, SpO2 98%.  LABS  Admission on 10/13/2023  Component Date Value Ref Range Status  . Sodium 10/13/2023 143  135 - 145 mmol/L Final  . Potassium 10/13/2023 4.4  3.5 - 5.1 mmol/L Final  . Chloride 10/13/2023 105  98 - 111 mmol/L Final  . CO2 10/13/2023 24  22 - 32 mmol/L Final  . Glucose, Bld 10/13/2023 112 (H)  70 - 99 mg/dL Final   Glucose reference range applies only to samples taken after fasting for at least 8 hours.  . BUN 10/13/2023 16  8 - 23 mg/dL Final  . Creatinine, Ser 10/13/2023 0.96  0.44 - 1.00 mg/dL Final  . Calcium  10/13/2023 8.5 (L)  8.9 - 10.3 mg/dL Final  . Total Protein 10/13/2023 7.3  6.5 - 8.1 g/dL Final  . Albumin  10/13/2023 4.4  3.5 - 5.0 g/dL Final  . AST 91/78/7974 23  15 - 41 U/L Final  . ALT 10/13/2023 22  0 - 44 U/L Final  . Alkaline Phosphatase 10/13/2023 56  38 - 126 U/L Final  . Total Bilirubin 10/13/2023 0.7  0.0 - 1.2 mg/dL Final  . GFR, Estimated 10/13/2023 >60  >60 mL/min Final   Comment: (NOTE) Calculated using the CKD-EPI Creatinine Equation (2021)   . Anion gap 10/13/2023 14  5 - 15 Final   Performed at [facility], [address], [place], [place] [address]  . Alcohol, Ethyl (B) 10/13/2023 <15  <15 mg/dL Final   Comment: (NOTE) For medical purposes only. Performed at [facility], [address] [place], [place] [address]   . WBC 10/13/2023 8.3  4.0 - 10.5 K/uL Final  . RBC 10/13/2023 4.04  3.87 - 5.11 MIL/uL Final  . Hemoglobin 10/13/2023 12.5  12.0 - 15.0 g/dL Final  . HCT 91/78/7974 36.7  36.0 - 46.0 % Final  . MCV 10/13/2023 90.8  80.0 - 100.0 fL Final  . MCH 10/13/2023 30.9  26.0 - 34.0 pg Final  . MCHC 10/13/2023 34.1  30.0 - 36.0 g/dL Final  . RDW 91/78/7974 12.6  11.5 - 15.5 % Final  . Platelets 10/13/2023 225  150 - 400 K/uL Final  . nRBC 10/13/2023 0.0  0.0 - 0.2 % Final   Performed at [facility], [address], [place], [place] [address]  . Opiates 10/13/2023 NONE DETECTED  NONE  DETECTED Final  . Cocaine 10/13/2023 NONE DETECTED  NONE DETECTED Final  . Benzodiazepines 10/13/2023 POSITIVE (A)  NONE DETECTED Final  . Amphetamines 10/13/2023 NONE DETECTED  NONE DETECTED Final  . Tetrahydrocannabinol 10/13/2023 NONE DETECTED  NONE DETECTED Final  . Barbiturates 10/13/2023 NONE DETECTED  NONE DETECTED Final   Comment: (NOTE) DRUG SCREEN FOR MEDICAL PURPOSES ONLY.  IF CONFIRMATION IS NEEDED FOR ANY PURPOSE, NOTIFY LAB WITHIN 5 DAYS.  LOWEST DETECTABLE LIMITS FOR URINE DRUG SCREEN Drug Class                     Cutoff (ng/mL) Amphetamine and metabolites    1000 Barbiturate and metabolites    200 Benzodiazepine                 200 Opiates and metabolites        300 Cocaine and metabolites        300 THC                            50 Performed at [facility], [address], [place], [place] [address]   . Troponin I (High Sensitivity) 10/13/2023 4  <18 ng/L Final   Comment: (NOTE) Elevated high sensitivity troponin I (hsTnI) values and significant  changes across serial measurements may suggest ACS but many other  chronic and acute conditions are known to elevate hsTnI results.  Refer to the Links section for chest pain algorithms and additional  guidance. Performed at [facility], [address] [place], [place] [address]   . Troponin I (High Sensitivity) 10/13/2023 4  <18 ng/L Final   Comment: (NOTE) Elevated high sensitivity troponin I (hsTnI) values and significant  changes across serial measurements may suggest ACS but many other  chronic and acute conditions are known to elevate hsTnI results.  Refer to the Links section for chest pain algorithms and additional  guidance. Performed at [facility], [address] [place], [place] [address]     PSYCHIATRIC REVIEW OF SYSTEMS (ROS)  ROS: Notable for the following relevant positive findings: ROS  Additional findings:      Musculoskeletal: {Musculoskeletal  neeeds/assessment:304550014}      Gait & Station: {Gait and Station:304550016}      Pain Screening: {Pain Description:304550015}      Nutrition & Dental Concerns: {Nutrition & Dental Concerns:304550017}  RISK FORMULATION/ASSESSMENT  Is the patient experiencing any suicidal or homicidal ideations: {yes/no:20286}       Explain if yes: *** Protective factors considered for safety management: ***  Risk factors/concerns considered for safety management: *** {CHL BH Risk Factors Safety Management:304550011}  Is there a safety management plan with the patient and treatment team to minimize risk factors and promote protective factors: {yes/no:20286}           Explain: *** Is crisis care placement or psychiatric hospitalization recommended: {yes/no:20286}     Based on my current evaluation and risk assessment, patient is determined at this time to be at:  {Risk level:304550009}  *RISK ASSESSMENT Risk assessment is a dynamic process; it is possible that this patient's condition, and risk level, may change. This should be re-evaluated and managed over time as appropriate. Please re-consult psychiatric consult services if additional assistance is needed in terms of risk assessment and management. If your team decides to discharge this patient, please advise the patient how to best access emergency psychiatric services, or to call 911, if their condition worsens or they feel unsafe in any way.   Jaysun Wessels A Devetta Hagenow, NP Telepsychiatry Consult Services

## 2023-10-13 NOTE — BH Assessment (Addendum)
 This patient has been referred to [facility] telecare for her teleassessment.  [facility] coordinator will reach out with a time and provider to see patient.

## 2023-10-14 DIAGNOSIS — F333 Major depressive disorder, recurrent, severe with psychotic symptoms: Secondary | ICD-10-CM

## 2023-10-14 HISTORY — DX: Major depressive disorder, recurrent, severe with psychotic symptoms: F33.3

## 2023-10-14 MED ORDER — QUETIAPINE FUMARATE 25 MG PO TABS: 12.5000 mg | ORAL_TABLET | Freq: Every day | ORAL | 0 refills | Status: DC

## 2023-10-16 ENCOUNTER — Encounter (HOSPITAL_COMMUNITY): Payer: Self-pay

## 2023-10-16 ENCOUNTER — Other Ambulatory Visit: Payer: Self-pay

## 2023-10-16 ENCOUNTER — Inpatient Hospital Stay (HOSPITAL_COMMUNITY)
Admission: AD | Admit: 2023-10-16 | Discharge: 2023-10-24 | DRG: 885 | Disposition: A | Discharge status: Home / Self Care | Source: Intra-hospital | Attending: Psychiatry | Admitting: Psychiatry

## 2023-10-16 ENCOUNTER — Ambulatory Visit (HOSPITAL_COMMUNITY)
Admission: EM | Admit: 2023-10-16 | Discharge: 2023-10-16 | Disposition: A | Discharge status: Other Acute Inpatient Hospital | Attending: Nurse Practitioner | Admitting: Nurse Practitioner

## 2023-10-16 DIAGNOSIS — Z79899 Other long term (current) drug therapy: Secondary | ICD-10-CM | POA: Diagnosis not present

## 2023-10-16 DIAGNOSIS — Z823 Family history of stroke: Secondary | ICD-10-CM | POA: Diagnosis not present

## 2023-10-16 DIAGNOSIS — G4733 Obstructive sleep apnea (adult) (pediatric): Secondary | ICD-10-CM | POA: Diagnosis not present

## 2023-10-16 DIAGNOSIS — F411 Generalized anxiety disorder: Secondary | ICD-10-CM | POA: Insufficient documentation

## 2023-10-16 DIAGNOSIS — J45909 Unspecified asthma, uncomplicated: Secondary | ICD-10-CM | POA: Diagnosis not present

## 2023-10-16 DIAGNOSIS — Z8249 Family history of ischemic heart disease and other diseases of the circulatory system: Secondary | ICD-10-CM

## 2023-10-16 DIAGNOSIS — R110 Nausea: Secondary | ICD-10-CM | POA: Diagnosis present

## 2023-10-16 DIAGNOSIS — Z9049 Acquired absence of other specified parts of digestive tract: Secondary | ICD-10-CM | POA: Diagnosis not present

## 2023-10-16 DIAGNOSIS — Z806 Family history of leukemia: Secondary | ICD-10-CM

## 2023-10-16 DIAGNOSIS — K219 Gastro-esophageal reflux disease without esophagitis: Secondary | ICD-10-CM | POA: Diagnosis not present

## 2023-10-16 DIAGNOSIS — Z634 Disappearance and death of family member: Secondary | ICD-10-CM

## 2023-10-16 DIAGNOSIS — Z7951 Long term (current) use of inhaled steroids: Secondary | ICD-10-CM

## 2023-10-16 DIAGNOSIS — I447 Left bundle-branch block, unspecified: Secondary | ICD-10-CM | POA: Diagnosis not present

## 2023-10-16 DIAGNOSIS — F333 Major depressive disorder, recurrent, severe with psychotic symptoms: Secondary | ICD-10-CM | POA: Insufficient documentation

## 2023-10-16 DIAGNOSIS — F459 Somatoform disorder, unspecified: Secondary | ICD-10-CM | POA: Diagnosis not present

## 2023-10-16 DIAGNOSIS — I10 Essential (primary) hypertension: Secondary | ICD-10-CM | POA: Diagnosis not present

## 2023-10-16 DIAGNOSIS — G4700 Insomnia, unspecified: Secondary | ICD-10-CM | POA: Diagnosis present

## 2023-10-16 DIAGNOSIS — K5900 Constipation, unspecified: Secondary | ICD-10-CM | POA: Diagnosis present

## 2023-10-16 DIAGNOSIS — F32A Depression, unspecified: Secondary | ICD-10-CM | POA: Diagnosis present

## 2023-10-16 DIAGNOSIS — F23 Brief psychotic disorder: Secondary | ICD-10-CM | POA: Diagnosis present

## 2023-10-16 DIAGNOSIS — Z953 Presence of xenogenic heart valve: Secondary | ICD-10-CM | POA: Diagnosis not present

## 2023-10-16 LAB — COMPREHENSIVE METABOLIC PANEL WITH GFR
ALT: 20 U/L (ref 0–44)
AST: 20 U/L (ref 15–41)
Albumin: 4.3 g/dL (ref 3.5–5.0)
Alkaline Phosphatase: 52 U/L (ref 38–126)
Anion gap: 12 (ref 5–15)
BUN: 23 mg/dL (ref 8–23)
CO2: 25 mmol/L (ref 22–32)
Calcium: 9.9 mg/dL (ref 8.9–10.3)
Chloride: 103 mmol/L (ref 98–111)
Creatinine, Ser: 1.16 mg/dL — ABNORMAL HIGH (ref 0.44–1.00)
GFR, Estimated: 52 mL/min — ABNORMAL LOW
Glucose, Bld: 102 mg/dL — ABNORMAL HIGH (ref 70–99)
Potassium: 3.5 mmol/L (ref 3.5–5.1)
Sodium: 140 mmol/L (ref 135–145)
Total Bilirubin: 0.6 mg/dL (ref 0.0–1.2)
Total Protein: 7.3 g/dL (ref 6.5–8.1)

## 2023-10-16 LAB — LIPID PANEL
Cholesterol: 209 mg/dL — ABNORMAL HIGH (ref 0–200)
HDL: 83 mg/dL
LDL Cholesterol: 109 mg/dL — ABNORMAL HIGH (ref 0–99)
Total CHOL/HDL Ratio: 2.5 ratio
Triglycerides: 86 mg/dL
VLDL: 17 mg/dL (ref 0–40)

## 2023-10-16 LAB — ETHANOL: Alcohol, Ethyl (B): <15 mg/dL

## 2023-10-16 LAB — HEMOGLOBIN A1C
Hgb A1c MFr Bld: 5.6 % (ref 4.8–5.6)
Mean Plasma Glucose: 114.02 mg/dL

## 2023-10-16 LAB — CBC WITH DIFFERENTIAL/PLATELET
Abs Granulocyte: 7.8 K/uL — ABNORMAL HIGH (ref 1.5–6.5)
Abs Immature Granulocytes: 0.03 K/uL (ref 0.00–0.07)
Basophils Absolute: 0.1 K/uL (ref 0.0–0.1)
Basophils Relative: 1 %
Eosinophils Absolute: 0.1 K/uL (ref 0.0–0.5)
Eosinophils Relative: 1 %
HCT: 39.9 % (ref 36.0–46.0)
Hemoglobin: 13 g/dL (ref 12.0–15.0)
Immature Granulocytes: 0 %
Lymphocytes Relative: 19 %
Lymphs Abs: 2.1 K/uL (ref 0.7–4.0)
MCH: 30.3 pg (ref 26.0–34.0)
MCHC: 32.6 g/dL (ref 30.0–36.0)
MCV: 93 fL (ref 80.0–100.0)
Monocytes Absolute: 0.7 K/uL (ref 0.1–1.0)
Monocytes Relative: 7 %
Neutro Abs: 7.8 K/uL — ABNORMAL HIGH (ref 1.7–7.7)
Neutrophils Relative %: 72 %
Platelets: UNDETERMINED K/uL (ref 150–400)
RBC: 4.29 MIL/uL (ref 3.87–5.11)
RDW: 12.9 % (ref 11.5–15.5)
WBC: 10.7 K/uL — ABNORMAL HIGH (ref 4.0–10.5)
nRBC: 0 % (ref 0.0–0.2)

## 2023-10-16 LAB — TSH: TSH: 0.915 u[IU]/mL (ref 0.350–4.500)

## 2023-10-16 LAB — VITAMIN B12: Vitamin B-12: 753 pg/mL (ref 180–914)

## 2023-10-16 LAB — HIV ANTIBODY (ROUTINE TESTING W REFLEX): HIV Screen 4th Generation wRfx: NONREACTIVE

## 2023-10-16 LAB — VITAMIN D 25 HYDROXY (VIT D DEFICIENCY, FRACTURES): Vit D, 25-Hydroxy: 47.18 ng/mL (ref 30–100)

## 2023-10-16 LAB — MAGNESIUM: Magnesium: 1.9 mg/dL (ref 1.7–2.4)

## 2023-10-16 MED ORDER — LORAZEPAM 1 MG PO TABS: 1.0000 mg | ORAL_TABLET | Freq: Two times a day (BID) | ORAL | Status: DC

## 2023-10-16 MED ORDER — LORAZEPAM 1 MG PO TABS: 1.0000 mg | ORAL_TABLET | Freq: Every day | ORAL | Status: DC

## 2023-10-16 MED ORDER — ALUM & MAG HYDROXIDE-SIMETH 200-200-20 MG/5ML PO SUSP
30.0000 mL | ORAL | Status: DC | PRN
Start: 2023-10-16 — End: 2023-10-16

## 2023-10-16 MED ORDER — ASPIRIN 81 MG PO TBEC
81.0000 mg | DELAYED_RELEASE_TABLET | Freq: Every day | ORAL | Status: DC
  Administered 2023-10-17 – 2023-10-24 (×8): 81 mg via ORAL
  Filled 2023-10-16 (×8): qty 1

## 2023-10-16 MED ORDER — PANTOPRAZOLE SODIUM 40 MG PO TBEC
40.0000 mg | DELAYED_RELEASE_TABLET | Freq: Every day | ORAL | Status: DC
  Administered 2023-10-17 – 2023-10-24 (×8): 40 mg via ORAL
  Filled 2023-10-16 (×8): qty 1

## 2023-10-16 MED ORDER — ALUM & MAG HYDROXIDE-SIMETH 200-200-20 MG/5ML PO SUSP: 30.0000 mL | ORAL | Status: DC | PRN

## 2023-10-16 MED ORDER — THIAMINE MONONITRATE 100 MG PO TABS: 100.0000 mg | ORAL_TABLET | Freq: Every day | ORAL | Status: DC

## 2023-10-16 MED ORDER — ACETAMINOPHEN 325 MG PO TABS
650.0000 mg | ORAL_TABLET | Freq: Four times a day (QID) | ORAL | Status: DC | PRN
  Administered 2023-10-17 – 2023-10-23 (×3): 650 mg via ORAL
  Filled 2023-10-16 (×3): qty 2

## 2023-10-16 MED ORDER — LOPERAMIDE HCL 2 MG PO CAPS: 2.0000 mg | ORAL_CAPSULE | ORAL | Status: AC | PRN

## 2023-10-16 MED ORDER — MULTIVITAMINS PO CAPS: 1.0000 | ORAL_CAPSULE | Freq: Every day | ORAL | Status: DC

## 2023-10-16 MED ORDER — LORAZEPAM 1 MG PO TABS
1.0000 mg | ORAL_TABLET | Freq: Three times a day (TID) | ORAL | Status: DC
  Administered 2023-10-17: 1 mg via ORAL
  Filled 2023-10-16: qty 1

## 2023-10-16 MED ORDER — LOPERAMIDE HCL 2 MG PO CAPS: 2.0000 mg | ORAL_CAPSULE | ORAL | Status: DC | PRN

## 2023-10-16 MED ORDER — ONDANSETRON 4 MG PO TBDP
4.0000 mg | ORAL_TABLET | Freq: Four times a day (QID) | ORAL | Status: AC | PRN
  Administered 2023-10-18 (×2): 4 mg via ORAL
  Filled 2023-10-16 (×2): qty 1

## 2023-10-16 MED ORDER — MIRTAZAPINE 7.5 MG PO TABS: 7.5000 mg | ORAL_TABLET | Freq: Every day | ORAL | Status: DC

## 2023-10-16 MED ORDER — MONTELUKAST SODIUM 10 MG PO TABS
10.0000 mg | ORAL_TABLET | Freq: Every day | ORAL | Status: DC
  Administered 2023-10-17 – 2023-10-24 (×8): 10 mg via ORAL
  Filled 2023-10-16 (×8): qty 1

## 2023-10-16 MED ORDER — MAGNESIUM HYDROXIDE 400 MG/5ML PO SUSP
30.0000 mL | Freq: Every day | ORAL | Status: DC | PRN
Start: 2023-10-16 — End: 2023-10-16

## 2023-10-16 MED ORDER — ACETAMINOPHEN 325 MG PO TABS: 650.0000 mg | ORAL_TABLET | Freq: Four times a day (QID) | ORAL | Status: DC | PRN

## 2023-10-16 MED ORDER — OLANZAPINE 5 MG PO TBDP: 5.0000 mg | ORAL_TABLET | Freq: Three times a day (TID) | ORAL | Status: DC | PRN

## 2023-10-16 MED ORDER — MONTELUKAST SODIUM 10 MG PO TABS
10.0000 mg | ORAL_TABLET | Freq: Every day | ORAL | Status: DC
  Administered 2023-10-16: 10 mg via ORAL
  Filled 2023-10-16: qty 1

## 2023-10-16 MED ORDER — MAGNESIUM HYDROXIDE 400 MG/5ML PO SUSP: 30.0000 mL | Freq: Every day | ORAL | Status: DC | PRN

## 2023-10-16 MED ORDER — MIRTAZAPINE 7.5 MG PO TABS
7.5000 mg | ORAL_TABLET | Freq: Every day | ORAL | Status: DC
  Administered 2023-10-16 – 2023-10-23 (×8): 7.5 mg via ORAL
  Filled 2023-10-16 (×8): qty 1

## 2023-10-16 MED ORDER — ALBUTEROL SULFATE HFA 108 (90 BASE) MCG/ACT IN AERS: 2.0000 | INHALATION_SPRAY | Freq: Four times a day (QID) | RESPIRATORY_TRACT | Status: DC | PRN

## 2023-10-16 MED ORDER — OLANZAPINE 10 MG IM SOLR
5.0000 mg | Freq: Three times a day (TID) | INTRAMUSCULAR | Status: DC | PRN
Start: 2023-10-16 — End: 2023-10-16

## 2023-10-16 MED ORDER — ONDANSETRON 4 MG PO TBDP: 4.0000 mg | ORAL_TABLET | Freq: Four times a day (QID) | ORAL | Status: DC | PRN

## 2023-10-16 MED ORDER — OLANZAPINE 10 MG IM SOLR: 5.0000 mg | Freq: Three times a day (TID) | INTRAMUSCULAR | Status: DC | PRN

## 2023-10-16 MED ORDER — OLANZAPINE 5 MG PO TBDP
5.0000 mg | ORAL_TABLET | Freq: Three times a day (TID) | ORAL | Status: DC | PRN
Start: 2023-10-16 — End: 2023-10-16

## 2023-10-16 MED ORDER — PANTOPRAZOLE SODIUM 40 MG PO TBEC
40.0000 mg | DELAYED_RELEASE_TABLET | Freq: Every day | ORAL | Status: DC
  Administered 2023-10-16: 40 mg via ORAL
  Filled 2023-10-16: qty 1

## 2023-10-16 MED ORDER — LORAZEPAM 1 MG PO TABS
1.0000 mg | ORAL_TABLET | Freq: Four times a day (QID) | ORAL | Status: DC
  Administered 2023-10-16: 1 mg via ORAL
  Filled 2023-10-16: qty 1

## 2023-10-16 MED ORDER — LORAZEPAM 1 MG PO TABS
1.0000 mg | ORAL_TABLET | Freq: Four times a day (QID) | ORAL | Status: AC
  Administered 2023-10-16 – 2023-10-17 (×2): 1 mg via ORAL
  Filled 2023-10-16 (×2): qty 1

## 2023-10-16 MED ORDER — ADULT MULTIVITAMIN W/MINERALS CH
1.0000 | ORAL_TABLET | Freq: Every day | ORAL | Status: DC
  Administered 2023-10-16: 1 via ORAL
  Filled 2023-10-16: qty 1

## 2023-10-16 MED ORDER — THIAMINE HCL 100 MG/ML IJ SOLN
100.0000 mg | Freq: Once | INTRAMUSCULAR | Status: DC
  Filled 2023-10-16: qty 2

## 2023-10-16 MED ORDER — VITAMIN B-1 100 MG PO TABS
100.0000 mg | ORAL_TABLET | Freq: Every day | ORAL | Status: DC
  Administered 2023-10-17 – 2023-10-24 (×8): 100 mg via ORAL
  Filled 2023-10-16 (×8): qty 1

## 2023-10-16 MED ORDER — ACETAMINOPHEN 325 MG PO TABS
650.0000 mg | ORAL_TABLET | Freq: Four times a day (QID) | ORAL | Status: DC | PRN
Start: 2023-10-16 — End: 2023-10-16

## 2023-10-16 MED ORDER — ADULT MULTIVITAMIN W/MINERALS CH
1.0000 | ORAL_TABLET | Freq: Every day | ORAL | Status: DC
Start: 2023-10-17 — End: 2023-10-24
  Administered 2023-10-17 – 2023-10-24 (×8): 1 via ORAL
  Filled 2023-10-16 (×8): qty 1

## 2023-10-16 MED ORDER — ASPIRIN 81 MG PO TBEC
81.0000 mg | DELAYED_RELEASE_TABLET | Freq: Every day | ORAL | Status: DC
  Administered 2023-10-16: 81 mg via ORAL
  Filled 2023-10-16: qty 1

## 2023-10-16 MED ORDER — LORAZEPAM 1 MG PO TABS: 1.0000 mg | ORAL_TABLET | Freq: Three times a day (TID) | ORAL | Status: DC

## 2023-10-16 NOTE — ED Notes (Signed)
 Patient admitted to observation unit due to worsening depression and bizarre behaviors. Patient under IVC status. Per IVC paperwork patient patient stated she sees the devil and shadows Paperwork also  reports paranoia and a report that the patient was stating she was with her granddaughter yesterday which was not true as the granddaughter was in daycare. Paperwork reports patient went outside naked and stated she was pure. Patient initially assessed wearing just a ripped flat sheet from home. Skin assessment completed and patient provided with scrubs and safety socks. Calm, cooperative throughout interview process. Oriented to unit. Meal and drink provided. During assessment with writer patient denies intent to harm self or others when asked. Denies A/VH. Patient denies any physical complaints when asked. Patient initially stated she has not had any falls within the past 6 months then stated she fell yesterday at home d/t [employer] figurines. Patient has delayed responses to assessment questions, appears confused. No acute distress noted. Support and encouragement provided. Routine safety checks conducted per facility protocol. Encouraged patient to notify staff if any thoughts of harm towards self or others arise. Patient verbalizes understanding and agreement.

## 2023-10-16 NOTE — ED Provider Notes (Cosign Needed Addendum)
 [facility] Continuous Assessment Admission H&P  Date: 10/16/23 Patient Name: Miranda Berger MRN: [id number] Chief Complaint: Depressive symptoms, paranoia & bizarre behaviors.  Diagnoses:  Final diagnoses:  MDD (major depressive disorder), recurrent, severe, with psychosis (HCC)  Acute psychosis (HCC)  GAD (generalized anxiety disorder)   HPI: Miranda Berger is a 68 y.o. female with a prior mental health history of MDD and GAD who presents to the [facility] this day accompanied by her husband with complaints of worsening depressive symptoms and bizarre behaviors.   As per triage:PT's husband states this morning the pt went [place]clothed [place]. PT's husband escorted the pt back to their home. PT states she just wanted to be in a field with Jesus and a blue sky. Miranda Berger, NT  (Nurse Tech).  Assessment: During encounter with patient, mood is very depressed, patient is tearful at times, verbalizes feeling very depressed, anxious, in the past at least 2 weeks, with paranoia and some psychosis starting earlier today morning.  Patient is accompanied by her husband who is able to confirm the above.  Patient reports that earlier today morning, she woke up and went to the bathroom, and had visual hallucinations of things on the walls in her bathroom, she states that there were multiple things crawling on the walls.  She also reports seeing shadows last Wednesday while checking her mail, states that this has not happened since that day, but restarted earlier today morning.  She reports also having auditory hallucinations; reports hearing airplanes as well as beeping sounds at her house, and was unable to make out what they were.  Presentation here to the behavioral health center is bizarre, as patient is wrapped up in a bed sheet, with no clothes underneath requiring for our staff to place her in patient scrubs; she verbalizes  her fear of touching her clothing at home, which she states might be implanted with something. She states that this is the reason why she did not wear her own clothing.  Patient verbalizes fearing that the doors at the assessment room have the metals implanted with some type of device which is being used to monitor her, and reports that her husband's brother Vilinda) is out to harm her so as to get his revenge). She states that she does not trust the nurse tech here at the [facility] because when she looked at her her face turned side ways in a weird manner. Patient is however calm, and cooperative while recounting all of the above, and just appears very distressed and saddened.  Patient reports worsening depressive symptoms including insomnia, anhedonia, feelings of guilt but does not elaborate.  Reports worsening decreased energy levels, poor concentration levels, reports that she has not been eating well for at least the past 2 weeks, reports psychomotor retardation type symptoms as well as worsening mental clouding, feelings of hopelessness, helplessness, and worthlessness; States all of the above have been worsening in the past few weeks.  Patient also reports symptoms significant for GAD including restlessness, excessive worrying, as well as muscle tension which have been ongoing in the past few months worsening prior to this hospitalization.  Patient denies SI, denies HI, presents with some passive suicidal ideations, stating that she wishes everything could just stop, and wishes she could be with Jesus. She denies intent or plan to harm herself or anyone else.  Denies any past suicide attempts, and patient and husband deny any past  mental health related hospitalizations.  Patient and husband also deny any past history of psychosis, and continue to report onset of psychosis as earlier today morning, but might not be reporting accurately, as patient reported earlier that she had visual hallucinations of  shadows last Wednesday.  Patient denies recreational substance use, including alcohol, marijuana, nicotine, denies use of any other substances not mention here.  Denies having any medical conditions, reports a history of one cardiac surgery, is unable to recall when she had this.  Patient reports that she takes Xanax , Wellbutrin , Remeron , Seroquel , but is currently a poor historian, is unable to remember the strength of her medications.  Patient is oriented to person, disoriented to time, knows that she is here to get help, she is unsure what the month is, she knows that it is 2025, and knows the current president, and also notes that she is in [place], [place].  She reports that she resides with her husband at home, and feels safe at home.  She denies any mental health related hospitalization, he denies any past suicide attempts.  As per chart review, home medications include alprazolam  0.5 mg twice daily, aspirin  81 mg daily, Wellbutrin  300 mg daily, Remeron  15 mg nightly, Singulair  10 mg daily, Prilosec 40 mg daily, Seroquel  12.5 mg nightly, and a multivitamin daily.   Recommendations:  Inpatient Behavioral health hospitalization for treatment & stabilization.  Musculoskeletal  Strength & Muscle Tone: within normal limits Gait & Station: normal Patient leans: N/A  Psychiatric Specialty Exam  Presentation General Appearance: Fairly Groomed  Eye Contact:Fair  Speech:Normal Rate  Speech Volume:Normal  Handedness:Right  Mood and Affect  Mood:Depressed; Anxious  Affect:Congruent  Thought Process  Thought Processes:Coherent  Descriptions of Associations:Intact  Orientation:Full (Time, Place and Person)  Thought Content:Logical    Hallucinations:Hallucinations: None; Visual; Auditory Description of Auditory Hallucinations: beeping spoundsd and airplanes Description of Visual Hallucinations: things on walls  Ideas of Reference:Paranoia; Percusatory  Suicidal  Thoughts:Suicidal Thoughts: Yes, Passive SI Passive Intent and/or Plan: Without Intent; Without Plan  Homicidal Thoughts:Homicidal Thoughts: No  Sensorium  Memory:Immediate Fair  Judgment:Fair  Insight:Fair  Executive Functions  Concentration:Fair  Attention Span:Fair  Recall:Fair  Fund of Knowledge:Fair  Language:Fair  Psychomotor Activity  Psychomotor Activity:Psychomotor Activity: Normal  Assets  Assets:No data recorded  Sleep  Sleep:Sleep: Poor  No data recorded  Physical Exam Vitals and nursing note reviewed.  HENT:     Head: Normocephalic.  Eyes:     Pupils: Pupils are equal, round, and reactive to light.  Musculoskeletal:        General: Normal range of motion.  Neurological:     Mental Status: She is alert.  Psychiatric:        Behavior: Behavior normal.    Review of Systems  Psychiatric/Behavioral:  Positive for depression and hallucinations. Negative for memory loss, substance abuse and suicidal ideas. The patient is nervous/anxious and has insomnia.     Blood pressure (!) 144/84, pulse 80, temperature 98.2 F (36.8 C), temperature source Oral, resp. rate 16, SpO2 100%. There is no height or weight on file to calculate BMI.  Past Psychiatric History: MDD & GAD   Is the patient at risk to self? Yes  Has the patient been a risk to self in the past 6 months? Yes .    Has the patient been a risk to self within the distant past? No   Is the patient a risk to others? No   Has the patient been a risk to  others in the past 6 months? No   Has the patient been a risk to others within the distant past? No   Past Medical History: Denies   Family History: not provided  Social History: Resides with husband, denies stressors  Last Labs:  Admission on 10/13/2023, Discharged on 10/14/2023  Component Date Value Ref Range Status   Sodium 10/13/2023 143  135 - 145 mmol/L Final   Potassium 10/13/2023 4.4  3.5 - 5.1 mmol/L Final   Chloride 10/13/2023 105   98 - 111 mmol/L Final   CO2 10/13/2023 24  22 - 32 mmol/L Final   Glucose, Bld 10/13/2023 112 (H)  70 - 99 mg/dL Final   Glucose reference range applies only to samples taken after fasting for at least 8 hours.   BUN 10/13/2023 16  8 - 23 mg/dL Final   Creatinine, Ser 10/13/2023 0.96  0.44 - 1.00 mg/dL Final   Calcium  10/13/2023 8.5 (L)  8.9 - 10.3 mg/dL Final   Total Protein 91/78/7974 7.3  6.5 - 8.1 g/dL Final   Albumin  10/13/2023 4.4  3.5 - 5.0 g/dL Final   AST 91/78/7974 23  15 - 41 U/L Final   ALT 10/13/2023 22  0 - 44 U/L Final   Alkaline Phosphatase 10/13/2023 56  38 - 126 U/L Final   Total Bilirubin 10/13/2023 0.7  0.0 - 1.2 mg/dL Final   GFR, Estimated 10/13/2023 >60  >60 mL/min Final   Comment: (NOTE) Calculated using the CKD-EPI Creatinine Equation (2021)    Anion gap 10/13/2023 14  5 - 15 Final   Performed at [facility], [address], [place], [place] [address]   Alcohol, Ethyl (B) 10/13/2023 <15  <15 mg/dL Final   Comment: (NOTE) For medical purposes only. Performed at [facility], [address] [place], [place] [address]    WBC 10/13/2023 8.3  4.0 - 10.5 K/uL Final   RBC 10/13/2023 4.04  3.87 - 5.11 MIL/uL Final   Hemoglobin 10/13/2023 12.5  12.0 - 15.0 g/dL Final   HCT 91/78/7974 36.7  36.0 - 46.0 % Final   MCV 10/13/2023 90.8  80.0 - 100.0 fL Final   MCH 10/13/2023 30.9  26.0 - 34.0 pg Final   MCHC 10/13/2023 34.1  30.0 - 36.0 g/dL Final   RDW 91/78/7974 12.6  11.5 - 15.5 % Final   Platelets 10/13/2023 225  150 - 400 K/uL Final   nRBC 10/13/2023 0.0  0.0 - 0.2 % Final   Performed at [facility], [address], [place], [place] [address]   Opiates 10/13/2023 NONE DETECTED  NONE DETECTED Final   Cocaine 10/13/2023 NONE DETECTED  NONE DETECTED Final   Benzodiazepines 10/13/2023 POSITIVE (A)  NONE DETECTED Final   Amphetamines 10/13/2023 NONE DETECTED  NONE DETECTED Final   Tetrahydrocannabinol 10/13/2023 NONE DETECTED  NONE DETECTED  Final   Barbiturates 10/13/2023 NONE DETECTED  NONE DETECTED Final   Comment: (NOTE) DRUG SCREEN FOR MEDICAL PURPOSES ONLY.  IF CONFIRMATION IS NEEDED FOR ANY PURPOSE, NOTIFY LAB WITHIN 5 DAYS.  LOWEST DETECTABLE LIMITS FOR URINE DRUG SCREEN Drug Class                     Cutoff (ng/mL) Amphetamine and metabolites    1000 Barbiturate and metabolites    200 Benzodiazepine                 200 Opiates and metabolites  300 Cocaine and metabolites        300 THC                            50 Performed at [facility], [address], [place], [place] [address]    Troponin I (High Sensitivity) 10/13/2023 4  <18 ng/L Final   Comment: (NOTE) Elevated high sensitivity troponin I (hsTnI) values and significant  changes across serial measurements may suggest ACS but many other  chronic and acute conditions are known to elevate hsTnI results.  Refer to the Links section for chest pain algorithms and additional  guidance. Performed at [facility], [address] [place], [place] [address]    Troponin I (High Sensitivity) 10/13/2023 4  <18 ng/L Final   Comment: (NOTE) Elevated high sensitivity troponin I (hsTnI) values and significant  changes across serial measurements may suggest ACS but many other  chronic and acute conditions are known to elevate hsTnI results.  Refer to the Links section for chest pain algorithms and additional  guidance. Performed at [facility], [address] [place], [place] [address]   Admission on 07/24/2023, Discharged on 07/24/2023  Component Date Value Ref Range Status   Color, UA 07/24/2023 straw (A)  yellow Final   Clarity, UA 07/24/2023 turbid (A)  clear Final   Glucose, UA 07/24/2023 negative  negative mg/dL Final   Bilirubin, UA 93/98/7974 negative  negative Final   Ketones, POC UA 07/24/2023 negative  negative mg/dL Final   Spec Grav, UA 93/98/7974 1.025  1.010 - 1.025 Final   Blood, UA 07/24/2023 large (A)   negative Final   pH, UA 07/24/2023 6.5  5.0 - 8.0 Final   Protein Ur, POC 07/24/2023 >=300 (A)  negative mg/dL Final   Urobilinogen, UA 07/24/2023 0.2  0.2 or 1.0 E.U./dL Final   Nitrite, UA 93/98/7974 Positive (A)  Negative Final   Leukocytes, UA 07/24/2023 Large (3+) (A)  Negative Final   Specimen Description 07/24/2023 URINE, CLEAN CATCH   Final   Special Requests 07/24/2023 NONE   Final   Culture 07/24/2023  (A)   Final                   Value:<10,000 COLONIES/mL INSIGNIFICANT GROWTH Performed at [facility], [address], [place], [place] [address]    Report Status 07/24/2023 07/25/2023 FINAL   Final    Allergies: Codeine  and Prednisone   Medications:  Facility Ordered Medications  Medication   acetaminophen  (TYLENOL ) tablet 650 mg   alum & mag hydroxide-simeth (MAALOX/MYLANTA) 200-200-20 MG/5ML suspension 30 mL   magnesium  hydroxide (MILK OF MAGNESIA) suspension 30 mL   OLANZapine  zydis (ZYPREXA ) disintegrating tablet 5 mg   OLANZapine  (ZYPREXA ) injection 5 mg   pantoprazole  (PROTONIX ) EC tablet 40 mg   montelukast  (SINGULAIR ) tablet 10 mg   mirtazapine  (REMERON ) tablet 7.5 mg   aspirin  EC tablet 81 mg   albuterol  (VENTOLIN  HFA) 108 (90 Base) MCG/ACT inhaler 2 puff   thiamine  (VITAMIN B1) injection 100 mg   [START ON 10/17/2023] thiamine  (VITAMIN B1) tablet 100 mg   loperamide  (IMODIUM ) capsule 2-4 mg   ondansetron  (ZOFRAN -ODT) disintegrating tablet 4 mg   LORazepam  (ATIVAN ) tablet 1 mg   Followed by   NOREEN ON 10/17/2023] LORazepam  (ATIVAN ) tablet 1 mg   Followed by   NOREEN ON 10/18/2023] LORazepam  (ATIVAN ) tablet 1 mg   Followed by   NOREEN  ON 10/19/2023] LORazepam  (ATIVAN ) tablet 1 mg   multivitamin with minerals tablet 1 tablet   PTA Medications  Medication Sig   fluticasone  (FLONASE ) 50 MCG/ACT nasal spray Place 1 spray into both nostrils daily as needed for allergies.    ALPRAZolam  (XANAX ) 0.5 MG tablet Take 0.5 mg by mouth in the morning and at  bedtime.   Multiple Vitamin (MULTIVITAMIN) capsule Take 1 capsule by mouth daily.   calcium  carbonate (OS-CAL) 600 MG TABS tablet Take 600 mg by mouth daily.    Fluticasone -Salmeterol (ADVAIR) 250-50 MCG/DOSE AEPB Inhale 1 puff into the lungs 2 (two) times daily.   mirtazapine  (REMERON ) 15 MG tablet Take 15 mg by mouth at bedtime.   albuterol  (PROVENTIL  HFA;VENTOLIN  HFA) 108 (90 Base) MCG/ACT inhaler Inhale 2 puffs into the lungs every 6 (six) hours as needed for wheezing or shortness of breath.   aspirin  EC 81 MG tablet Take 81 mg by mouth daily.   buPROPion  (WELLBUTRIN  XL) 300 MG 24 hr tablet Take 1 tablet (300 mg total) by mouth daily.   levocetirizine (XYZAL) 5 MG tablet Take 5 mg by mouth daily.   montelukast  (SINGULAIR ) 10 MG tablet Take 10 mg by mouth daily.   omeprazole (PRILOSEC) 40 MG capsule Take 40 mg by mouth daily.   prednisoLONE acetate (PRED FORTE) 1 % ophthalmic suspension 1 drop 4 (four) times daily.   cyanocobalamin  (VITAMIN B12) 500 MCG tablet Take 500 mcg by mouth daily.   saccharomyces boulardii (FLORASTOR) 250 MG capsule Take 250 mg by mouth daily.   acetaminophen  (TYLENOL ) 500 MG tablet Take 1,000 mg by mouth every 8 (eight) hours as needed.   QUEtiapine  (SEROQUEL ) 25 MG tablet Take 0.5 tablets (12.5 mg total) by mouth at bedtime.    Diagnoses: MDD Recurrent severe with psychosis, GAD  Medical Decision Making  Treatment Plan Summary: Daily contact with patient to assess and evaluate symptoms and progress in treatment and Medication management  Recommendations  Based on my evaluation the patient appears to have an emergency mental health condition for which I recommend the patient be transferred to an inpatient behavioral health unit for treatment and stabilization.   Medications Plan: Will Discontinue (Wellbutrin )-Home medication- as this might be worsening patient's anxiety.   -Decreased Remeron  (home med) from 15 mg to 7.5 mg to maximize the benefit of this  medication.  -Will start Patient on an Ativan  taper so as to wean patient off Xanax  due to more complicated side effects such as the risks of falls & cognitive impairments, as patient advances in age with Benzodiazepine type medications.  -Holding off on Restarting Seroquel  12.5 mg at this time to ascertain if pt has a UTI, and if so, will need to be starting on antibiotics to see if psychosis resolves. -Restarted other home meds-Multivits, protonix , ASA.  Patient also most likely is suffering from a UTI, since urinalysis from June for 3 days, and as per chart review, it is not showing where patient was treated.  We will await results from urinalysis to indicate if antibiotic therapy is warranted.   PRNS -Continue Tylenol  650 mg every 6 hours PRN for mild pain -Continue Maalox 30 mg every 4 hrs PRN for indigestion -Continue Milk of Magnesia as needed every 6 hrs for constipation  Safety and Monitoring: Voluntary admission to inpatient psychiatric unit for safety, stabilization and treatment Daily contact with patient to assess and evaluate symptoms and progress in treatment Patient's case to be discussed in multi-disciplinary team meeting Observation Level :  q15 minute checks Vital signs: q12 hours Precautions: Safety  Labs: Ordered labs for acute psychosis as follows: HIV, RPR, GC chlamydia.  Also ordered the following baseline labs: CMP, CBC, lipid panel, TSH, hemoglobin A1c, vitamin B12, vitamin D , ordered baseline EKG.  Long Term Goal(s): Improvement in symptoms so as ready for discharge  Short Term Goals: Ability to identify changes in lifestyle to reduce recurrence of condition will improve, Ability to verbalize feelings will improve, Ability to disclose and discuss suicidal ideas, Ability to demonstrate self-control will improve, Ability to identify and develop effective coping behaviors will improve, Ability to maintain clinical measurements within normal limits will improve, and  Compliance with prescribed medications will improve  Discharge Planning: Social work and case management to assist with discharge planning and identification of hospital follow-up needs prior to discharge Estimated LOS: 5-7 days Discharge Concerns: Need to establish a safety plan; Medication compliance and effectiveness Discharge Goals: Return home with outpatient referrals for mental health follow-up including medication management/psychotherapy  Total Time Spent in Direct Patient Care:  I personally spent 75 minutes on the unit in direct patient care. The direct patient care time included face-to-face time with the patient, reviewing the patient's chart, communicating with other professionals, and coordinating care. Greater than 50% of this time was spent in counseling or coordinating care with the patient regarding goals of hospitalization, psycho-education, and discharge planning needs.   I certify that inpatient services furnished can reasonably be expected to improve the patient's condition.    Donia Snell, NP 8/24/202512:36 PM

## 2023-10-16 NOTE — BH Assessment (Signed)
 Comprehensive Clinical Assessment (CCA) Note  10/16/2023 Miranda Berger [id number]  DISPOSITION: Patient meets inpatient criteria as a (Geropsychiatry) bed will be investigated to assist with stabilization.  The patient demonstrates the following risk factors for suicide: Chronic risk factors for suicide include: psychiatric disorder of depression. Acute risk factors for suicide include: depression. Protective factors for this patient include: coping skills. Considering these factors, the overall suicide risk at this point appears to be low. Patient is not appropriate for outpatient follow up.    Patient is a 68 year old female that presents this date as a voluntary walk in to [facility] brought in by her husband Miranda Berger [phone] who assist with collateral information. Patient renders a conflicting history on arrival stating she had no thoughts of self harm at the time of triage although reports passive SI at the time of assessment. Patient denies any active plan or intent. When asked to elaborate on SI patient states, I just can't do this. Again when asked to clarify the content of statement patient is observed to, just shake her head. Patient denies any HI although reports active AVH to include, hearing airplanes, and sees shadow people. Patient has a PMHx of MDD and GAD. Patient states she receives OP medication management from [facility] although cannot recall the name of that provider. Patient denies currently having a OP counselor. Patient denies any previous attempts or gestures at self harm. Patient denies any SA issues or access to firearms.   Patient is observed to be very disorganized and finds it difficult to render her history. Patient is observed to be tearful at times, verbalizes feeling very depressed and states earlier this date she walked from her residence to, feel the tall grass and be with God. Husband who is present states the some psychosis started earlier today  when she attempted to walk out of their home undressed. Patient reports that earlier today, she woke up and went to the bathroom, and had visual hallucinations of things on the walls in her bathroom.  Patient reports worsening depressive symptoms over the last week to include: insomnia (patient states she has only been sleeping 2 to 3 hours a night for the last week), anhedonia, feelings of guilt but does not elaborate.  Reports worsening decreased energy levels, poor concentration levels as well as feelings of hopelessness, helplessness, and worthlessness. Patient also reports symptoms significant for GAD including restlessness, excessive worrying, as well as muscle tension.   Patient denies recreational substance use, including alcohol, marijuana, nicotine, denies use of any other substances not mention here.  Denies having any medical conditions, reports a history of one cardiac surgery, is unable to recall when she had this.   Per notes, Patient reports that she takes Xanax , Wellbutrin , Remeron , Seroquel , but is currently a poor historian, is unable to remember the strength of her medications.  Patient is oriented to person, disoriented to time, knows that she is here to get help, she is unsure what the month is, she knows that it is 2025, and knows the current president, and also notes that she is in [place], [place].  Patient is observed to be oriented x 3. Patient does not seem to process some of this writer's questions as evidenced by patient not answering and appears to be staring at the wall. Patient's memory is recent impaired with thoughts disorganized. Patient's mood is depressed with affect congruent. Patient speaks in a low soft voice that is difficult to understand at times. Patient does not appear  to be responding to internal stimuli.           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 Self  What Is the Reason for Your Visit/Call Today? PT Miranda Berger [phone] female presents to [facility] accompanied by spouse and daughter in law. PT states she has been diagnosed with MDD and anxiety and takes medication. PT states she was admitted to [facility] hospital recently. PT mentioned she sees a Veterinary surgeon but not regularly. Pt's husband stated the pt was really anxious this morning; paranoid, not wanting to touch things that were unclean. PT's husband states this morning the pt went outside unclothed [place] PT's husband escorted the pt back to their home. PT states she just wanted to be in a field with Jesus and a blue sky. No hx of suicide attempts, pt denies SI, HI, AVH and alcohol and substance use. When asked what would help the most today, the PT stated she just wanted to be somewhere safe.  How Long Has This Been Causing You Problems? <Week  What Do You Feel Would Help You the Most Today? Treatment for Depression or other mood problem   Have You Recently Had Any Thoughts About Hurting Yourself? Yes  Are You Planning to Commit Suicide/Harm Yourself At This time? No   Flowsheet Row ED from 10/16/2023 in [facility] ED from 10/13/2023 in [facility] from 07/24/2023 in [facility] at [facility])  C-SSRS RISK CATEGORY No Risk No Risk No Risk    Have you Recently Had Thoughts About Hurting Someone Sherral? No  Are You Planning to Harm Someone at This Time? No  Explanation: NA   Have You Used Any Alcohol or Drugs in the Past 24 Hours? No  How Long Ago Did You Use Drugs or Alcohol? NA What Did You Use and How Much? NA  Do You Currently Have a Therapist/Psychiatrist? Yes  Name of Therapist/Psychiatrist: Name of  Therapist/Psychiatrist: [facility]   Have You Been Recently Discharged From Any Office Practice or Programs? No  Explanation of Discharge From Practice/Program: NA    CCA Screening Triage Referral Assessment Type of Contact: Face-to-Face  Telemedicine Service Delivery:  10/16/2023 Is this Initial or Reassessment?  initial Date Telepsych consult ordered in [facility]:   10/16/2023 Time Telepsych consult ordered in [facility]: 1200   Location of Assessment: [facility] Services  Provider Location: [facility] Assessment Services   Collateral Involvement: Patient's husband Miranda Berger who is present   Does Patient Have a Automotive engineer Guardian? No  Legal Guardian Contact Information: NA  Copy of Legal Guardianship Form: -- (NA)  Legal Guardian Notified of Arrival: -- (NA)  Legal Guardian Notified of Pending Discharge: -- (NA)  If Minor and Not Living with Parent(s), Who has Custody? NA  Is CPS involved or ever been involved? Never  Is APS involved or ever been involved? Never   Patient Determined To Be At Risk for Harm To Self or Others Based on Review of Patient Reported Information or Presenting Complaint? Yes, for Self-Harm  Method: No Plan  Availability of Means: No access or NA  Intent: Vague intent  or NA  Notification Required: No need or identified person  Additional Information for Danger to Others Potential: -- (NA)  Additional Comments for Danger to Others Potential: NA  Are There Guns or Other Weapons in Your Home? No  Types of Guns/Weapons: NA  Are These Weapons Safely Secured?                            -- (NA)  Who Could Verify You Are Able To Have These Secured: NA  Do You Have any Outstanding Charges, Pending Court Dates, Parole/Probation? Patient denies  Contacted To Inform of Risk of Harm To Self or Others: Other: Comment (NA)    Does Patient Present under Involuntary Commitment? No    [place] of Residence: [place]   Patient  Currently Receiving the Following Services: Medication Management   Determination of Need: Urgent (48 hours)   Options For Referral: Inpatient Hospitalization     CCA Biopsychosocial Patient Reported Schizophrenia/Schizoaffective Diagnosis in Past: No   Strengths: Patient is willing to participate in treatment   Mental Health Symptoms Depression:  Change in energy/activity; Difficulty Concentrating; Hopelessness; Tearfulness   Duration of Depressive symptoms: Duration of Depressive Symptoms: Less than two weeks   Mania:  None   Anxiety:   Difficulty concentrating   Psychosis:  Hallucinations   Duration of Psychotic symptoms: Duration of Psychotic Symptoms: Less than six months   Trauma:  None   Obsessions:  None   Compulsions:  None   Inattention:  None   Hyperactivity/Impulsivity:  None   Oppositional/Defiant Behaviors:  None   Emotional Irregularity:  Chronic feelings of emptiness   Other Mood/Personality Symptoms:  None noted    Mental Status Exam Appearance and self-care  Stature:  Average   Weight:  Average weight   Clothing:  Neat/clean   Grooming:  Normal   Cosmetic use:  None   Posture/gait:  Stooped   Motor activity:  Slowed   Sensorium  Attention:  Confused; Distractible   Concentration:  Anxiety interferes   Orientation:  X5   Recall/memory:  Normal   Affect and Mood  Affect:  Anxious; Depressed   Mood:  Depressed; Anxious   Relating  Eye contact:  Avoided   Facial expression:  Depressed   Attitude toward examiner:  Cooperative   Thought and Language  Speech flow: Slow   Thought content:  Appropriate to Mood and Circumstances   Preoccupation:  None   Hallucinations:  Auditory; Visual   Organization:  Disorganized   Company secretary of Knowledge:  Fair   Intelligence:  Average   Abstraction:  Normal   Judgement:  Fair   Brewing technologist   Insight:  Fair   Decision Making:  Only  simple   Social Functioning  Social Maturity:  Responsible   Social Judgement:  Normal   Stress  Stressors:  Other (Comment) (New onset of MH isssues)   Coping Ability:  Overwhelmed   Skill Deficits:  Activities of daily living   Supports:  Family     Religion: Religion/Spirituality Are You A Religious Person?: Yes What is Your Religious Affiliation?: Christian How Might This Affect Treatment?: NA  Leisure/Recreation: Leisure / Recreation Do You Have Hobbies?: No  Exercise/Diet: Exercise/Diet Do You Exercise?: No Have You Gained or Lost A Significant Amount of Weight in the Past Six Months?: No Do You Follow a Special Diet?: No Do You Have Any Trouble Sleeping?: No  CCA Employment/Education Employment/Work Situation: Employment / Work Systems developer: Retired Passenger transport manager has Been Impacted by Current Illness: No (Her anxiety influenced her to retire early) Has Patient ever Been in Equities trader?: No  Education: Education Is Patient Currently Attending School?: No Last Grade Completed: 12 Did You Product manager?: No Did You Have An Individualized Education Program (IIEP): No Did You Have Any Difficulty At [employer]?: No Patient's Education Has Been Impacted by Current Illness: No   CCA Family/Childhood History Family and Relationship History: Family history Marital status: Married Number of Years Married: 17 What types of issues is patient dealing with in the relationship?: Husband is supportive Additional relationship information: None noted Does patient have children?: Yes How many children?: 2 How is patient's relationship with their children?: Adult children who are supportive also  Childhood History:  Childhood History By whom was/is the patient raised?: Both parents Did patient suffer any verbal/emotional/physical/sexual abuse as a child?: No Did patient suffer from severe childhood neglect?: No Has patient ever been sexually  abused/assaulted/raped as an adolescent or adult?: No Was the patient ever a victim of a crime or a disaster?: No Witnessed domestic violence?: No Has patient been affected by domestic violence as an adult?: No       CCA Substance Use Alcohol/Drug Use: Alcohol / Drug Use Pain Medications: See MARs Prescriptions: See MARs Over the Counter: See MARs History of alcohol / drug use?: No history of alcohol / drug abuse Longest period of sobriety (when/how long): NA Negative Consequences of Use:  (none) Withdrawal Symptoms: Other (Comment) (none)                         ASAM's:  Six Dimensions of Multidimensional Assessment  Dimension 1:  Acute Intoxication and/or Withdrawal Potential:   Dimension 1:  Description of individual's past and current experiences of substance use and withdrawal: NA  Dimension 2:  Biomedical Conditions and Complications:   Dimension 2:  Description of patient's biomedical conditions and  complications: NA  Dimension 3:  Emotional, Behavioral, or Cognitive Conditions and Complications:  Dimension 3:  Description of emotional, behavioral, or cognitive conditions and complications: NA  Dimension 4:  Readiness to Change:  Dimension 4:  Description of Readiness to Change criteria: NA  Dimension 5:  Relapse, Continued use, or Continued Problem Potential:  Dimension 5:  Relapse, continued use, or continued problem potential critiera description: NA  Dimension 6:  Recovery/Living Environment:  Dimension 6:  Recovery/Iiving environment criteria description: NA  ASAM Severity Score:    ASAM Recommended Level of Treatment: ASAM Recommended Level of Treatment:  (NA)   Substance use Disorder (SUD) Substance Use Disorder (SUD)  Checklist Symptoms of Substance Use:  (NA)  Recommendations for Services/Supports/Treatments: Recommendations for Services/Supports/Treatments Recommendations For Services/Supports/Treatments:  (NA)  Disposition Recommendation per  psychiatric provider: We recommend inpatient psychiatric hospitalization when medically cleared. Patient is under voluntary admission status at this time; please IVC if attempts to leave hospital.   DSM5 Diagnoses: Patient Active Problem List   Diagnosis Date Noted   MDD (major depressive disorder), recurrent, severe, with psychosis (HCC) 10/14/2023   Onychomycosis 08/16/2023   Depression 12/01/2021   Family history of adverse reaction to anesthesia 12/01/2021   GERD (gastroesophageal reflux disease) 12/01/2021   Hepatitis 12/01/2021   PONV (postoperative nausea and vomiting) 12/01/2021   History of hypertension 12/01/2021   LBBB (left bundle branch block) 08/08/2017   Obstructive sleep apnea    Anxiety  Asthma    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 07/26/2017   Aortic stenosis 06/20/2017   Degenerative tear of medial meniscus of right knee 04/21/2016   High-tone pelvic floor dysfunction 12/26/2013   Dyspareunia 06/22/2012     Referrals to Alternative Service(s): Referred to Alternative Service(s):   Place:   Date:   Time:    Referred to Alternative Service(s):   Place:   Date:   Time:    Referred to Alternative Service(s):   Place:   Date:   Time:    Referred to Alternative Service(s):   Place:   Date:   Time:     Miranda Berger, LCAS
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
## 2023-10-16 NOTE — Discharge Instructions (Signed)
 Please transfer patient to [facility] for inpatient treatment and stabilization of her mental status

## 2023-10-16 NOTE — Progress Notes (Signed)
   10/16/23 1610  Psych Admission Type (Psych Patients Only)  Admission Status Involuntary  Psychosocial Assessment  Patient Complaints Decreased concentration;Confusion  Eye Contact Fair  Facial Expression Animated  Affect Sad  Speech Soft;Slow  Interaction Cautious  Motor Activity Slow  Appearance/Hygiene In scrubs;Unremarkable  Behavior Characteristics Cooperative;Calm  Mood Pleasant  Thought Process  Coherency WDL  Content WDL  Delusions None reported or observed  Perception Hallucinations  Hallucination Auditory;Visual  Judgment Impaired  Confusion Mild  Danger to Self  Current suicidal ideation? Denies  Agreement Not to Harm Self Yes  Description of Agreement verbal  Danger to Others  Danger to Others None reported or observed   Pt presents confused and disoriented to situation and time. Pt is unable to recall why she is here. Pt denies alcohol or drug use but states she a one glass of wine this week. Pt endorses AVH and states Sometimes. I have heard things downstairs sometimes. Voices. I was walking down the hill the other day. I saw a stroller coming toward me. A family of three. That kind of scared me. I was imagining it.

## 2023-10-16 NOTE — Group Note (Signed)
 Date:  10/16/2023 Time:  9:17 PM  Group Topic/Focus:  Wrap-Up Group:   The focus of this group is to help patients review their daily goal of treatment and discuss progress on daily workbooks.    Participation Level:  Active  Participation Quality:  Redirectable and Sharing  Affect:  Appropriate  Cognitive:  Confused  Insight: Good  Engagement in Group:  Engaged  Modes of Intervention:  Discussion  Additional Comments:   Pt seemed confused about writers questioning during group discussion. Pt states she went to group and called her husband. Pt endorsed anxiety and depression at a 3  Miranda Berger A [name] 10/16/2023, 9:17 PM

## 2023-10-16 NOTE — ED Notes (Signed)
 Patient transferred to [facility]. Patient remains under IVC status, paperwork given to [employer]. Patient voices understanding of transfer. Patient belongings returned to patient from locker #17 complete and intact. Patient escorted to back sallyport via staff for transport to destination. Safety maintained.

## 2023-10-16 NOTE — Tx Team (Signed)
 Initial Treatment Plan 10/16/2023 7:53 PM [name] FMW:[id number]    PATIENT STRESSORS: Health problems     PATIENT STRENGTHS: Supportive family/friends    PATIENT IDENTIFIED PROBLEMS: MDD, recurrent severe with psychosis                      DISCHARGE CRITERIA:  Improved stabilization in mood, thinking, and/or behavior Medical problems require only outpatient monitoring  PRELIMINARY DISCHARGE PLAN: Outpatient therapy  PATIENT/FAMILY INVOLVEMENT: This treatment plan has been presented to and reviewed with the patient, Miranda Berger.  The patient and family have been given the opportunity to ask questions and make suggestions.  Evanny Ellerbe M Jaquala Fuller, RN 10/16/2023, 7:53 PM

## 2023-10-17 ENCOUNTER — Encounter: Payer: Self-pay | Admitting: *Deleted

## 2023-10-17 ENCOUNTER — Encounter (HOSPITAL_COMMUNITY): Payer: Self-pay

## 2023-10-17 DIAGNOSIS — F333 Major depressive disorder, recurrent, severe with psychotic symptoms: Secondary | ICD-10-CM | POA: Diagnosis not present

## 2023-10-17 LAB — RPR: RPR Ser Ql: NONREACTIVE

## 2023-10-17 LAB — FOLATE: Folate: 18.4 ng/mL (ref >5.9)

## 2023-10-17 LAB — URINALYSIS, ROUTINE W REFLEX MICROSCOPIC
Bilirubin Urine: NEGATIVE
Glucose, UA: NEGATIVE mg/dL
Hgb urine dipstick: NEGATIVE
Ketones, ur: NEGATIVE mg/dL
Leukocytes,Ua: NEGATIVE
Nitrite: NEGATIVE
Protein, ur: NEGATIVE mg/dL
Specific Gravity, Urine: >1.03 — ABNORMAL HIGH (ref 1.005–1.030)
pH: 6 (ref 5.0–8.0)

## 2023-10-17 LAB — RAPID URINE DRUG SCREEN, HOSP PERFORMED
Amphetamines: NOT DETECTED
Barbiturates: NOT DETECTED
Benzodiazepines: POSITIVE — AB
Cocaine: NOT DETECTED
Opiates: NOT DETECTED
Tetrahydrocannabinol: NOT DETECTED

## 2023-10-17 LAB — VITAMIN D 25 HYDROXY (VIT D DEFICIENCY, FRACTURES): Vit D, 25-Hydroxy: 45.47 ng/mL (ref 30–100)

## 2023-10-17 LAB — VITAMIN B12: Vitamin B-12: 635 pg/mL (ref 180–914)

## 2023-10-17 LAB — HIV ANTIBODY (ROUTINE TESTING W REFLEX): HIV Screen 4th Generation wRfx: NONREACTIVE

## 2023-10-17 LAB — TSH: TSH: 2.469 u[IU]/mL (ref 0.350–4.500)

## 2023-10-17 LAB — GAMMA GT: GGT: 16 U/L (ref 7–50)

## 2023-10-17 MED ORDER — LORAZEPAM 1 MG PO TABS: 1.0000 mg | ORAL_TABLET | Freq: Four times a day (QID) | ORAL | Status: DC | PRN

## 2023-10-17 MED ORDER — BUPROPION HCL ER (XL) 150 MG PO TB24
150.0000 mg | ORAL_TABLET | Freq: Every day | ORAL | Status: DC
  Administered 2023-10-17 – 2023-10-18 (×2): 150 mg via ORAL
  Filled 2023-10-17 (×2): qty 1

## 2023-10-17 MED ORDER — HYDROXYZINE HCL 25 MG PO TABS
25.0000 mg | ORAL_TABLET | Freq: Three times a day (TID) | ORAL | Status: DC | PRN
  Administered 2023-10-17 – 2023-10-18 (×2): 25 mg via ORAL
  Filled 2023-10-17 (×2): qty 1

## 2023-10-17 MED ORDER — TRAZODONE HCL 50 MG PO TABS
50.0000 mg | ORAL_TABLET | Freq: Every evening | ORAL | Status: DC | PRN
  Administered 2023-10-17 – 2023-10-20 (×3): 50 mg via ORAL
  Filled 2023-10-17 (×5): qty 1

## 2023-10-17 NOTE — Progress Notes (Signed)
   10/16/23 2135  Psych Admission Type (Psych Patients Only)  Admission Status Involuntary  Psychosocial Assessment  Patient Complaints Confusion;Decreased concentration  Eye Contact Brief  Facial Expression Flat  Affect Appropriate to circumstance;Sad;Flat  Speech Soft  Interaction Cautious  Motor Activity Slow  Appearance/Hygiene In scrubs  Behavior Characteristics Cooperative  Mood Pleasant  Thought Process  Coherency Disorganized  Content Religiosity  Delusions None reported or observed  Perception Hallucinations  Hallucination Visual;Auditory  Judgment Impaired  Confusion Mild  Danger to Self  Current suicidal ideation? Denies

## 2023-10-17 NOTE — Progress Notes (Signed)
(  Sleep Hours) -5 (Any PRNs that were needed, meds refused, or side effects to meds)-  none (Any disturbances and when (visitation, over night)- none (Concerns raised by the patient)- none (SI/HI/AVH)- denies all

## 2023-10-17 NOTE — Plan of Care (Signed)
  Problem: Education: Goal: Mental status will improve Outcome: Not Progressing   Problem: Activity: Goal: Interest or engagement in activities will improve Outcome: Not Progressing

## 2023-10-17 NOTE — Group Note (Signed)
 Therapy Group Note  Group Topic:Other  Group Date: 10/17/2023 Start Time: 1500 End Time: 1534 Facilitators: Dot Dallas MATSU, OT    The objective of today's group is to provide a comprehensive understanding of the concept of motivation and its role in human behavior and well-being. The content covers various theories of motivation, including intrinsic and extrinsic motivators, and explores the psychological mechanisms that drive individuals to achieve goals, overcome obstacles, and make decisions. By diving into real-world applications, the group aims to offer actionable strategies for enhancing motivation in different life domains, such as work, relationships, and personal growth.  Utilizing a multi-disciplinary approach, this group integrates insights from psychology, neuroscience, and behavioral economics to present a holistic view of motivation. The objective is not only to educate the audience about the complexities and driving forces behind motivation but also to equip them with practical tools and techniques to improve their own motivation levels. By the end of this multi-day group, patient's should have a well-rounded understanding of what motivates human actions and how to harness this knowledge for personal and professional betterment.     Participation Level: Engaged   Participation Quality: Independent   Behavior: Appropriate   Speech/Thought Process: Relevant   Affect/Mood: Appropriate   Insight: Fair   Judgement: Fair      Modes of Intervention: Education  Patient Response to Interventions:  Attentive   Plan: Continue to engage patient in OT groups 2 - 3x/week.  10/17/2023  [place] MATSU Dot, OT  Carleah Yablonski, OT

## 2023-10-17 NOTE — Plan of Care (Signed)
   Problem: Education: Goal: Emotional status will improve Outcome: Not Progressing Goal: Mental status will improve Outcome: Not Progressing

## 2023-10-17 NOTE — Group Note (Signed)
 Recreation Therapy Group Note   Group Topic:Team Building  Group Date: 10/17/2023 Start Time: 0930 End Time: 0958 Facilitators: Cheyne Bungert-McCall, LRT,CTRS Location: [address] Dayroom   Group Topic: Communication, Team Building, Problem Solving  Goal Area(s) Addresses:  Patient will effectively work with peer towards shared goal.  Patient will identify skills used to make activity successful.  Patient will identify how skills used during activity can be applied to reach post d/c goals.   Behavioral Response:   Intervention: STEM Activity- Glass blower/designer  Activity: Tallest [employer]. In teams of 5-6, patients were given 11 craft pipe cleaners. Using the materials provided, patients were instructed to compete again the opposing team(s) to build the tallest free-standing structure from floor level. The activity was timed; difficulty increased by Clinical research associate as Production designer, theatre/television/film continued.  Systematically resources were removed with additional directions for example, placing one arm behind their back, working in silence, and shape stipulations. LRT facilitated post-activity discussion reviewing team processes and necessary communication skills involved in completion. Patients were encouraged to reflect how the skills utilized, or not utilized, in this activity can be incorporated to positively impact support systems post discharge.  Education: Pharmacist, community, Scientist, physiological, Discharge Planning   Education Outcome: Acknowledges education/In group clarification offered/Needs additional education.    Affect/Mood: N/A   Participation Level: Did not attend    Clinical Observations/Individualized Feedback:      Plan: Continue to engage patient in RT group sessions 2-3x/week.   Lakeitha Basques-McCall, LRT,CTRS  10/17/2023 12:31 PM
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
## 2023-10-17 NOTE — Group Note (Signed)
 Date:  10/17/2023 Time:  4:56 PM  Group Topic/Focus:  Emotional and Physical Wellness Education:   The purpose of this group is to identify and challenge unhealthy thought patterns key to emotional wellness. The connection between emotional and physical wellness was also discussed.     Participation Level:  Did Not Attend   Miranda Berger 10/17/2023, 4:56 PM

## 2023-10-17 NOTE — BHH Group Notes (Signed)
 Spiritual care group on grief and loss facilitated by Chaplain Rockie Sofia, B[facility]  Group Goal: Support / Education around grief and loss  Members engage in facilitated group support and psycho-social education.  Group Description:  Following introductions and group rules, group members engaged in facilitated group dialogue and support around topic of loss, with particular support around experiences of loss in their lives. Group Identified types of loss (relationships / self / things) and identified patterns, circumstances, and changes that precipitate losses. Reflected on thoughts / feelings around loss, normalized grief responses, and recognized variety in grief experience. Group encouraged individual reflection on safe space and on the coping skills that they are already utilizing.  Group drew on Adlerian / Rogerian and narrative framework  Patient Progress: Miranda Berger attended group and participated in group discussion.  She was tearful at times and shared about several losses that she has experienced or expects to experience sometime soon, due to a family member's illness.

## 2023-10-17 NOTE — H&P (Signed)
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 suicide, but pt currently verbally contracts for safety on unit?   Discharge Planning:  Social work and case management to assist with discharge planning and identification of hospital follow-up needs prior to discharge  Estimated LOS: 5-7?days  Discharge Concerns: Need to establisDoes effecxor cause dizziness?  Safety plan; Medication compliance and effectiveness  Discharge Goals: Return home with outpatient referrals for mental health follow-up including medication management/psychotherapy.   Long Term Goal(s): Improvement in symptoms so as ready for discharge  Short Term Goals: Ability to identify changes in lifestyle to reduce recurrence of condition will  improve, Ability to verbalize feelings will improve, Ability to disclose and discuss suicidal ideas, Ability to demonstrate self-control will improve, Ability to identify and develop effective coping behaviors will improve, Ability to maintain clinical measurements within normal limits will improve, Compliance with prescribed medications will improve, and Ability to identify triggers associated with substance abuse/mental health issues will improve  Physician Treatment Plan for Secondary Diagnosis: Principal Problem:   MDD (major depressive disorder), recurrent, severe, with psychosis (HCC)  I certify that inpatient services furnished can reasonably be expected to improve the patient's condition.    Ellouise JAYSON Azure, FNP 8/25/20259:30 AM

## 2023-10-17 NOTE — Group Note (Signed)
 Date:  10/17/2023 Time:  8:10 PM  Group Topic/Focus:    Pt did attend AA meeting  Participation Level:  Active  Participation Quality:  Appropriate  Affect:  Appropriate  Cognitive:  Appropriate  Insight: Appropriate  Engagement in Group:  Engaged  Modes of Intervention:  Discussion  Additional Comments:  n/a  [name] [name] 10/17/2023, 8:10 PM

## 2023-10-17 NOTE — [facility] Suicide Risk Assessment (Signed)
 Suicide Risk Assessment  Admission Assessment    [facility] Admission Suicide Risk Assessment  Nursing information obtained from:  Patient Demographic factors:  Age 68 or older, Caucasian Current Mental Status:  NA Loss Factors:  NA Historical Factors:  NA Risk Reduction Factors:  Sense of responsibility to family, Living with another person, especially a relative, Positive social support  Total Time spent with patient: 45 minutes  Principal Problem: MDD (major depressive disorder), recurrent, severe, with psychosis (HCC) Diagnosis:  Principal Problem:   MDD (major depressive disorder), recurrent, severe, with psychosis (HCC)  Subjective Data: This is the first inpatient psychiatric admission for Miranda Berger, a 67 year old Caucasian female with prior psychiatric diagnoses significant for MDD recurrent, severe with psychotic features, OSA, GAD, and prior medical diagnoses significant for degenerative tear of medial meniscus of right knee, aortic stenosis, 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, asthma, LBBB, GERD, and family history of adverse reaction to anesthesia.  Patient presents involuntarily to [facility] for worsening depression and bizarre behaviors.   Continued Clinical Symptoms:  Alcohol Use Disorder Identification Test Final Score (AUDIT): 1 The Alcohol Use Disorders Identification Test, Guidelines for Use in Primary Care, Second Edition.  World Science writer [facility]). Score between 0-7:  no or low risk or alcohol related problems. Score between 8-15:  moderate risk of alcohol related problems. Score between 16-19:  high risk of alcohol related problems. Score 20 or above:  warrants further diagnostic evaluation for alcohol dependence and treatment.  CLINICAL FACTORS:   Severe Anxiety and/or Agitation Depression:   Anhedonia Delusional Insomnia Severe Alcohol/Substance Abuse/Dependencies More than one psychiatric diagnosis Previous  Psychiatric Diagnoses and Treatments Medical Diagnoses and Treatments/Surgeries  Musculoskeletal: Strength & Muscle Tone: within normal limits Gait & Station: normal Patient leans: N/A  Psychiatric Specialty Exam:  Presentation  General Appearance:  Casual  Eye Contact: Good  Speech: Clear and Coherent  Speech Volume: Normal  Handedness: Right  Mood and Affect  Mood: Anxious; Depressed; Dysphoric  Affect: Congruent  Thought Process  Thought Processes: Coherent  Descriptions of Associations:Intact  Orientation:Full (Time, Place and Person)  Thought Content:Logical  History of Schizophrenia/Schizoaffective disorder:No  Duration of Psychotic Symptoms:Less than six months  Hallucinations:Hallucinations: None Description of Auditory Hallucinations: beeping spoundsd and airplanes Description of Visual Hallucinations: things on walls  Ideas of Reference:Paranoia  Suicidal Thoughts:Suicidal Thoughts: Yes, Passive SI Passive Intent and/or Plan: Without Intent; Without Plan; Without Means to Carry Out  Homicidal Thoughts:Homicidal Thoughts: No  Sensorium  Memory: Immediate Fair  Judgment: Fair  Insight: Fair  Executive Functions  Concentration: Good  Attention Span: Good  Recall: [employer] of Knowledge: Fair  Language: Fair  Psychomotor Activity  Psychomotor Activity: Psychomotor Activity: Normal  Assets  Assets: Communication Skills; Physical Health; Resilience; Housing; Social Support  Sleep  Sleep: Sleep: Good Number of Hours of Sleep: 5  Physical Exam: Physical Exam Vitals and nursing note reviewed.  Constitutional:      General: She is not in acute distress.    Appearance: She is not ill-appearing.  HENT:     Head: Normocephalic.     Right Ear: External ear normal.     Left Ear: External ear normal.     Nose: Nose normal.     Mouth/Throat:     Mouth: Mucous membranes are moist.     Pharynx: Oropharynx is clear.   Eyes:     Extraocular Movements: Extraocular movements intact.  Cardiovascular:     Rate and Rhythm: Normal  rate.     Pulses: Normal pulses.  Pulmonary:     Effort: Pulmonary effort is normal. No respiratory distress.  Abdominal:     Comments: Deferred  Genitourinary:    Comments: Deferred Musculoskeletal:        General: Normal range of motion.  Neurological:     General: No focal deficit present.     Mental Status: She is alert and oriented to person, place, and time.  Psychiatric:        Mood and Affect: Mood normal.        Behavior: Behavior normal.    Review of Systems  Constitutional:  Negative for chills and fever.  HENT:  Negative for sore throat.   Eyes:  Negative for blurred vision.  Respiratory:  Negative for cough, sputum production, shortness of breath and wheezing.   Cardiovascular:  Negative for chest pain and palpitations.  Gastrointestinal:  Negative for heartburn and nausea.  Genitourinary:  Negative for dysuria.  Musculoskeletal:  Negative for falls.  Skin:  Negative for itching and rash.  Neurological:  Negative for dizziness and headaches.  Endo/Heme/Allergies:        See allergy referral  Psychiatric/Behavioral:  Positive for depression, hallucinations, substance abuse and suicidal ideas. The patient is nervous/anxious. The patient does not have insomnia.    Blood pressure (!) 157/79, pulse 83, temperature 98.2 F (36.8 C), temperature source Oral, resp. rate 18, height 5' 4 (1.626 m), weight 72 kg, SpO2 99%. Body mass index is 27.26 kg/m.   COGNITIVE FEATURES THAT CONTRIBUTE TO RISK:  Polarized thinking    SUICIDE RISK:   Severe:  Frequent, intense, and enduring suicidal ideation, specific plan, no subjective intent, but some objective markers of intent (i.e., choice of lethal method), the method is accessible, some limited preparatory behavior, evidence of impaired self-control, severe dysphoria/symptomatology, multiple risk factors present, and  few if any protective factors, particularly a lack of social support.  PLAN OF CARE: Treatment Plan Summary: Daily contact with patient to assess and evaluate symptoms and progress in treatment and Medication management  Observation Level/Precautions:  15 minute checks  Laboratory:   CBC: WBC 10.7 elevated, neutrophil 7.8 elevated, otherwise normal. Chemistry Profile: Glucose 102 elevated, creatinine 1.16 elevated, GFR estimated none African-American 52 low, otherwise normal.   Lipid profile: Cholesterol 209 high, LDL 109 high, otherwise normal.  Glucose HCG: Not applicable UDS: Positive for benzodiazepine UA: Specific gravity greater than 1.030 elevated, otherwise normal  Vitamin B-12: Ordered Folic Acid : Ordered GGT: Ordered HbAIC: Ordered B12: Ordered Vitamin D  25-hydroxy: Ordered TSH: Ordered Hemoglobin A1c: Ordered   Psychotherapy: Yes  Medications: See MAR  Consultations: Pending  Discharge Concerns: Safety  Estimated LOS: 3 to 7 days  Other:     Assessment:  This is the first inpatient psychiatric admission for Miranda Berger, a 68 year old Caucasian female with prior psychiatric diagnoses significant for MDD recurrent, severe with psychotic features, OSA, GAD, and prior medical diagnoses significant for degenerative tear of medial meniscus of right knee, aortic stenosis, 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, asthma, LBBB, GERD, and family history of adverse reaction to anesthesia.  Patient presents involuntarily to [facility] for worsening depression and bizarre behaviors.   May physician Treatment Plan for Primary Diagnosis: MDD (major depressive disorder), recurrent, severe, with psychosis (HCC)  Plan: Medications: --Continue Remeron  tablets 7.5 mg p.o. daily at bedtime for depression and sleep --Continue Wellbutrin  XL 150 mg 24-hour tablet p.o. daily for depression -- Initiate Risperdal  2  mg p.o. daily at bedtime for psychosis  --  Initiate hydroxyzine  25 mg p.o. 3 times daily as needed for anxiety  --Initiate trazodone  50 mg tablet p.o. nightly as needed for insomnia  Continue Ativan  detox protocol  [facility] agitation protocol   Medications for other medical problems: --Continue with insulin  HFA 108 90 base MCG/ACT inhaler 2 puffs every 6 hours as needed for anxiety --Continue aspirin  EC tablet 81 mg p.o. daily for platelet aggregation prevention --Continue montelukast  tablet 10 mg p.o. daily for asthma --Continue Protonix  EC tablet 40 mg p.o. daily for GERD  Other PRN Medications  -Acetaminophen  650 mg every 6 as needed/mild pain  -Maalox 30 mL oral every 4 as needed/digestion  -Magnesium  hydroxide 30 mL daily as needed/mild constipation   --The risks/benefits/side-effects/alternatives to this medication were discussed in detail with the patient and time was given for questions. The patient consents to medication trial.   -- Metabolic profile and EKG monitoring obtained while on an atypical antipsychotic (BMI: Lipid Panel: HbgA1c: QTc:)   -- Encouraged patient to participate in unit milieu and in scheduled group therapies   Safety and Monitoring:  Voluntary admission to inpatient psychiatric unit for safety, stabilization and treatment  Daily contact with patient to assess and evaluate symptoms and progress in treatment  Patient's case to be discussed in multi-disciplinary team meeting  Observation Level : q15 minute checks  Vital signs: q12 hours  Precautions: suicide, but pt currently verbally contracts for safety on unit?   Discharge Planning:  Social work and case management to assist with discharge planning and identification of hospital follow-up needs prior to discharge  Estimated LOS: 5-7?days  Discharge Concerns: Need to establisDoes effecxor cause dizziness?  Safety plan; Medication compliance and effectiveness  Discharge Goals: Return home with outpatient referrals for mental health follow-up  including medication management/psychotherapy.   Long Term Goal(s): Improvement in symptoms so as ready for discharge  Short Term Goals: Ability to identify changes in lifestyle to reduce recurrence of condition will improve, Ability to verbalize feelings will improve, Ability to disclose and discuss suicidal ideas, Ability to demonstrate self-control will improve, Ability to identify and develop effective coping behaviors will improve, Ability to maintain clinical measurements within normal limits will improve, Compliance with prescribed medications will improve, and Ability to identify triggers associated with substance abuse/mental health issues will improve  Physician Treatment Plan for Secondary Diagnosis: Principal Problem:   MDD (major depressive disorder), recurrent, severe, with psychosis (HCC)  I certify that inpatient services furnished can reasonably be expected to improve the patient's condition.   Ellouise JAYSON Azure, FNP 10/17/2023, 9:24 AM

## 2023-10-17 NOTE — Progress Notes (Signed)
   10/17/23 1100  Psych Admission Type (Psych Patients Only)  Admission Status Involuntary  Psychosocial Assessment  Patient Complaints Decreased concentration;Confusion  Eye Contact Brief  Facial Expression Flat  Affect Sad;Flat  Speech Soft  Interaction Cautious  Motor Activity Slow  Appearance/Hygiene Unremarkable  Behavior Characteristics Cooperative  Mood Pleasant  Thought Process  Coherency Disorganized  Content Religiosity  Delusions None reported or observed  Perception Hallucinations  Hallucination Visual;Auditory  Judgment Impaired  Confusion Mild  Danger to Self  Current suicidal ideation? Denies  Description of Suicide Plan No Plan  Agreement Not to Harm Self Yes  Description of Agreement Verbal  Danger to Others  Danger to Others None reported or observed

## 2023-10-17 NOTE — BH IP Treatment Plan (Signed)
 Interdisciplinary Treatment and Diagnostic Plan Update  10/17/2023 Time of Session: 10:20AM Miranda Berger MRN: [id number]  Principal Diagnosis: MDD (major depressive disorder), recurrent, severe, with psychosis (HCC)  Secondary Diagnoses: Principal Problem:   MDD (major depressive disorder), recurrent, severe, with psychosis (HCC)   Current Medications:  Current Facility-Administered Medications  Medication Dose Route Frequency Provider Last Rate Last Admin   acetaminophen  (TYLENOL ) tablet 650 mg  650 mg Oral Q6H PRN Tex Drilling, NP   650 mg at 10/17/23 1302   albuterol  (VENTOLIN  HFA) 108 (90 Base) MCG/ACT inhaler 2 puff  2 puff Inhalation Q6H PRN Tex Drilling, NP       alum & mag hydroxide-simeth (MAALOX/MYLANTA) 200-200-20 MG/5ML suspension 30 mL  30 mL Oral Q4H PRN Tex Drilling, NP       aspirin  EC tablet 81 mg  81 mg Oral Daily Nkwenti, Doris, NP   81 mg at 10/17/23 9181   loperamide  (IMODIUM ) capsule 2-4 mg  2-4 mg Oral PRN Nkwenti, Doris, NP       LORazepam  (ATIVAN ) tablet 1 mg  1 mg Oral Q6H PRN Parker, Alvin S, MD       magnesium  hydroxide (MILK OF MAGNESIA) suspension 30 mL  30 mL Oral Daily PRN Tex Drilling, NP       mirtazapine  (REMERON ) tablet 7.5 mg  7.5 mg Oral QHS Nkwenti, Doris, NP   7.5 mg at 10/16/23 2131   montelukast  (SINGULAIR ) tablet 10 mg  10 mg Oral Daily Nkwenti, Doris, NP   10 mg at 10/17/23 0818   multivitamin with minerals tablet 1 tablet  1 tablet Oral Daily Tex Drilling, NP   1 tablet at 10/17/23 9181   OLANZapine  (ZYPREXA ) injection 5 mg  5 mg Intramuscular TID PRN Tex Drilling, NP       OLANZapine  zydis (ZYPREXA ) disintegrating tablet 5 mg  5 mg Oral TID PRN Tex Drilling, NP       ondansetron  (ZOFRAN -ODT) disintegrating tablet 4 mg  4 mg Oral Q6H PRN Tex Drilling, NP       pantoprazole  (PROTONIX ) EC tablet 40 mg  40 mg Oral Daily Nkwenti, Doris, NP   40 mg at 10/17/23 0818   thiamine  (Vitamin B-1) tablet 100 mg  100 mg Oral Daily  Nkwenti, Doris, NP   100 mg at 10/17/23 0818   PTA Medications: Medications Prior to Admission  Medication Sig Dispense Refill Last Dose/Taking   acetaminophen  (TYLENOL ) 500 MG tablet Take 1,000 mg by mouth every 8 (eight) hours as needed.      albuterol  (PROVENTIL  HFA;VENTOLIN  HFA) 108 (90 Base) MCG/ACT inhaler Inhale 2 puffs into the lungs every 6 (six) hours as needed for wheezing or shortness of breath.      ALPRAZolam  (XANAX ) 0.5 MG tablet Take 0.5 mg by mouth in the morning and at bedtime.      aspirin  EC 81 MG tablet Take 81 mg by mouth daily.      buPROPion  (WELLBUTRIN  XL) 300 MG 24 hr tablet Take 1 tablet (300 mg total) by mouth daily. 30 tablet 0    calcium  carbonate (OS-CAL) 600 MG TABS tablet Take 600 mg by mouth daily.       cyanocobalamin  (VITAMIN B12) 500 MCG tablet Take 500 mcg by mouth daily.      fluticasone  (FLONASE ) 50 MCG/ACT nasal spray Place 1 spray into both nostrils daily as needed for allergies.       Fluticasone -Salmeterol (ADVAIR) 250-50 MCG/DOSE AEPB Inhale 1 puff into the lungs 2 (two)  times daily.      levocetirizine (XYZAL) 5 MG tablet Take 5 mg by mouth daily.      mirtazapine  (REMERON ) 15 MG tablet Take 15 mg by mouth at bedtime.      montelukast  (SINGULAIR ) 10 MG tablet Take 10 mg by mouth daily.      Multiple Vitamin (MULTIVITAMIN) capsule Take 1 capsule by mouth daily.      omeprazole (PRILOSEC) 40 MG capsule Take 40 mg by mouth daily.      QUEtiapine  (SEROQUEL ) 25 MG tablet Take 0.5 tablets (12.5 mg total) by mouth at bedtime. 30 tablet 0    saccharomyces boulardii (FLORASTOR) 250 MG capsule Take 250 mg by mouth daily.       Patient Stressors: Health problems    Patient Strengths: Supportive family/friends   Treatment Modalities: Medication Management, Group therapy, Case management,  1 to 1 session with clinician, Psychoeducation, Recreational therapy.   Physician Treatment Plan for Primary Diagnosis: MDD (major depressive disorder), recurrent,  severe, with psychosis (HCC) Long Term Goal(s): Improvement in symptoms so as ready for discharge   Short Term Goals: Ability to identify changes in lifestyle to reduce recurrence of condition will improve Ability to verbalize feelings will improve Ability to disclose and discuss suicidal ideas Ability to demonstrate self-control will improve Ability to identify and develop effective coping behaviors will improve Ability to maintain clinical measurements within normal limits will improve Compliance with prescribed medications will improve Ability to identify triggers associated with substance abuse/mental health issues will improve  Medication Management: Evaluate patient's response, side effects, and tolerance of medication regimen.  Therapeutic Interventions: 1 to 1 sessions, Unit Group sessions and Medication administration.  Evaluation of Outcomes: Not Progressing  Physician Treatment Plan for Secondary Diagnosis: Principal Problem:   MDD (major depressive disorder), recurrent, severe, with psychosis (HCC)  Long Term Goal(s): Improvement in symptoms so as ready for discharge   Short Term Goals: Ability to identify changes in lifestyle to reduce recurrence of condition will improve Ability to verbalize feelings will improve Ability to disclose and discuss suicidal ideas Ability to demonstrate self-control will improve Ability to identify and develop effective coping behaviors will improve Ability to maintain clinical measurements within normal limits will improve Compliance with prescribed medications will improve Ability to identify triggers associated with substance abuse/mental health issues will improve     Medication Management: Evaluate patient's response, side effects, and tolerance of medication regimen.  Therapeutic Interventions: 1 to 1 sessions, Unit Group sessions and Medication administration.  Evaluation of Outcomes: Not Progressing   RN Treatment Plan for  Primary Diagnosis: MDD (major depressive disorder), recurrent, severe, with psychosis (HCC) Long Term Goal(s): Knowledge of disease and therapeutic regimen to maintain health will improve  Short Term Goals: Ability to remain free from injury will improve, Ability to verbalize frustration and anger appropriately will improve, Ability to demonstrate self-control, Ability to participate in decision making will improve, Ability to verbalize feelings will improve, Ability to disclose and discuss suicidal ideas, Ability to identify and develop effective coping behaviors will improve, and Compliance with prescribed medications will improve  Medication Management: RN will administer medications as ordered by provider, will assess and evaluate patient's response and provide education to patient for prescribed medication. RN will report any adverse and/or side effects to prescribing provider.  Therapeutic Interventions: 1 on 1 counseling sessions, Psychoeducation, Medication administration, Evaluate responses to treatment, Monitor vital signs and CBGs as ordered, Perform/monitor CIWA, COWS, AIMS and Fall Risk screenings as ordered, Perform wound  care treatments as ordered.  Evaluation of Outcomes: Not Progressing   LCSW Treatment Plan for Primary Diagnosis: MDD (major depressive disorder), recurrent, severe, with psychosis (HCC) Long Term Goal(s): Safe transition to appropriate next level of care at discharge, Engage patient in therapeutic group addressing interpersonal concerns.  Short Term Goals: Engage patient in aftercare planning with referrals and resources, Increase social support, Increase ability to appropriately verbalize feelings, Increase emotional regulation, Facilitate acceptance of mental health diagnosis and concerns, Facilitate patient progression through stages of change regarding substance use diagnoses and concerns, and Identify triggers associated with mental health/substance abuse  issues  Therapeutic Interventions: Assess for all discharge needs, 1 to 1 time with Social worker, Explore available resources and support systems, Assess for adequacy in community support network, Educate family and significant other(s) on suicide prevention, Complete Psychosocial Assessment, Interpersonal group therapy.  Evaluation of Outcomes: Not Progressing   Progress in Treatment: Attending groups: Yes. Attending some groups Participating in groups: Yes. Taking medication as prescribed: Yes. Toleration medication: Yes. Family/Significant other contact made: No, will contact:  Alm Rias, husband, [phone] or Meara Wiechman, son, [phone], Harlene Daughter in-law  Patient understands diagnosis: Yes. Discussing patient identified problems/goals with staff: Yes. Medical problems stabilized or resolved: Yes. Denies suicidal/homicidal ideation: Yes. Issues/concerns per patient self-inventory: No. Patient Goals:  [place]  Discharge Plan or Barriers: Pt likely to discharge home once stable.   Reason for Continuation of Hospitalization: Anxiety Delusions   Estimated Length of Stay: 5-7 days  Last 3 [place] Suicide Severity Risk Score: Flowsheet Row [facility]Current[facility] Most recent reading at 10/16/2023  4:10 PM ED from 10/16/2023 in [facility] Most recent reading at 10/16/2023 12:33 PM ED from 10/13/2023 in [facility] Most recent reading at 10/13/2023  1:29 PM  C-SSRS RISK CATEGORY No Risk No Risk No Risk    Last PHQ 2/9 Scores:    10/12/2017    5:11 PM  Depression screen PHQ 2/9  Decreased Interest 0  Down, Depressed, Hopeless 0  PHQ - 2 Score 0    Scribe for Treatment Team: Paxtyn Boyar M Rameses Ou, [place] 10/17/2023 1:42 PM

## 2023-10-17 NOTE — Group Note (Signed)
 Date:  10/17/2023 Time:  4:01 PM  Group Topic/Focus:  Goals Group:   [name] [name]    [name] 10/17/2023, 4:01 PM

## 2023-10-18 ENCOUNTER — Ambulatory Visit: Payer: Medicare PPO | Admitting: Adult Health

## 2023-10-18 ENCOUNTER — Encounter: Payer: Self-pay | Admitting: Adult Health

## 2023-10-18 DIAGNOSIS — F333 Major depressive disorder, recurrent, severe with psychotic symptoms: Secondary | ICD-10-CM | POA: Diagnosis not present

## 2023-10-18 LAB — HIV ANTIBODY (ROUTINE TESTING W REFLEX): HIV Screen 4th Generation wRfx: NONREACTIVE

## 2023-10-18 LAB — RPR: RPR Ser Ql: NONREACTIVE

## 2023-10-18 MED ORDER — RISPERIDONE 2 MG PO TBDP
2.0000 mg | ORAL_TABLET | Freq: Two times a day (BID) | ORAL | Status: DC
  Administered 2023-10-18 – 2023-10-19 (×2): 2 mg via ORAL
  Filled 2023-10-18 (×2): qty 1

## 2023-10-18 MED ORDER — BUPROPION HCL ER (XL) 300 MG PO TB24
300.0000 mg | ORAL_TABLET | Freq: Every day | ORAL | Status: DC
  Administered 2023-10-19: 300 mg via ORAL
  Filled 2023-10-18: qty 1

## 2023-10-18 MED ORDER — POLYETHYLENE GLYCOL 3350 17 G PO PACK
17.0000 g | PACK | Freq: Every day | ORAL | Status: DC
Start: 2023-10-18 — End: 2023-10-18

## 2023-10-18 MED ORDER — HYDROXYZINE HCL 25 MG PO TABS
25.0000 mg | ORAL_TABLET | Freq: Three times a day (TID) | ORAL | Status: DC
  Administered 2023-10-18 – 2023-10-24 (×18): 25 mg via ORAL
  Filled 2023-10-18 (×18): qty 1

## 2023-10-18 MED ORDER — HALOPERIDOL 1 MG PO TABS: 1.0000 mg | ORAL_TABLET | Freq: Two times a day (BID) | ORAL | Status: DC

## 2023-10-18 MED ORDER — RISPERIDONE 2 MG PO TABS: 2.0000 mg | ORAL_TABLET | Freq: Two times a day (BID) | ORAL | Status: DC

## 2023-10-18 MED ORDER — RISPERIDONE 2 MG PO TBDP
2.0000 mg | ORAL_TABLET | Freq: Once | ORAL | Status: AC
  Administered 2023-10-18: 2 mg via ORAL
  Filled 2023-10-18: qty 1

## 2023-10-18 MED ORDER — DOCUSATE SODIUM 100 MG PO CAPS
100.0000 mg | ORAL_CAPSULE | Freq: Two times a day (BID) | ORAL | Status: DC
  Administered 2023-10-18 – 2023-10-24 (×12): 100 mg via ORAL
  Filled 2023-10-18 (×12): qty 1

## 2023-10-18 NOTE — Group Note (Signed)
 Date:  10/18/2023 Time:  10:22 AM  Group Topic/Focus:  Goals Group:   [name]    Participation Level:  Did Not Attend  Participation Quality:  n/a  Affect:  n/a  Cognitive:  n/a  Insight: None  Engagement in Group:  n/a  Modes of Intervention:  n/a  Additional Comments:  did not attend  Nat Rummer 10/18/2023, 10:22 AM

## 2023-10-18 NOTE — Plan of Care (Signed)

## 2023-10-18 NOTE — Progress Notes (Signed)
   10/17/23 2005  Psych Admission Type (Psych Patients Only)  Admission Status Involuntary  Psychosocial Assessment  Patient Complaints Depression  Eye Contact Brief  Facial Expression Sad;Wide-eyed  Affect Sad  Speech Soft  Interaction Cautious;Guarded  Motor Activity Slow  Appearance/Hygiene Unremarkable  Behavior Characteristics Appropriate to situation  Mood Depressed  Thought Process  Coherency Loose associations  Content Confabulation  Delusions None reported or observed  Perception Hallucinations  Hallucination Visual  Judgment Poor  Confusion Moderate  Danger to Self  Current suicidal ideation?  (Denies)  Agreement Not to Harm Self Yes  Description of Agreement Notify Staff  Danger to Others  Danger to Others None reported or observed

## 2023-10-18 NOTE — Plan of Care (Signed)
   Problem: Education: Goal: Knowledge of [facility] Education information/materials will improve Outcome: Progressing Goal: Emotional status will improve Outcome: Progressing Goal: Mental status will improve Outcome: Progressing Goal: Verbalization of understanding the information provided will improve Outcome: Progressing

## 2023-10-18 NOTE — BH Assessment (Addendum)
(  Sleep Hours) - 5.25 (Any PRNs that were needed, meds refused, or side effects to meds)- None (Any disturbances and when (visitation, over night)- Pt continues to be A/O to self with noted confusion to time and place.  (Concerns raised by the patient)- None (SI/HI/AVH)- Denies SI/HI; H/A and Endorsed H/V

## 2023-10-18 NOTE — Group Note (Signed)
 Date:  10/18/2023 Time:  3:43 PM  Group Topic/Focus:  Overcoming Stress:   The focus of this group is to define stress and help patients assess their triggers. Self Care:   The focus of this group is to help patients understand the importance of self-care in order to improve or restore emotional, physical, spiritual, interpersonal, and financial health.    Participation Level:  Active  Participation Quality:  Appropriate and Attentive  Affect:  Appropriate  Cognitive:  Alert and Confused  Insight: Appropriate  Engagement in Group:  Improving and Supportive  Modes of Intervention:  Activity and Support   Miranda Berger  Miranda Berger 10/18/2023, 3:43 PM

## 2023-10-18 NOTE — Group Note (Signed)
 LCSW Group Therapy Note   Group Date: 10/18/2023 Start Time: 1100 End Time: 1200   Participation:  did not attend  Type of Therapy:  Group Therapy  Topic:  Understanding Your Path to Change  Objective:  The goal is to help individuals understand the stages of change, identify where they currently are in the process, and provide actionable next steps to continue moving forward in their journey of change.  Goals: Learn about the six stages of change:  Precontemplation, Contemplation, Preparation, Action, Maintenance, and Relapse Reflect on Current Change Efforts:  Recognize which stage participants are in regarding a personal change. Plan Next Steps for Moving Forward:  Create an action plan based on their current stage of change.  Group Summary:  In this session, we explored the Stages of Change as a framework to understand the process of change.  We discussed how each stage helps individuals recognize where they are in their personal journey and used the Stages of Change Worksheet for self-reflection. Participants answered questions to better understand their current stage, challenges, and progress. We also emphasized the importance of moving forward, even if setbacks (Relapse) occur, and created actionable steps to help participants continue progressing. By the end of the session, participants gained a clearer understanding of their path to change and left with a clear plan for next steps.  Therapeutic Modalities:  Elements of CBT (cognitive restructuring, problem solving)  Element of DBT (mindfulness, distress tolerance)   Clydia Nieves O Ellionna Buckbee, LCSWA 10/18/2023  5:05 PM

## 2023-10-18 NOTE — Group Note (Signed)
 Recreation Therapy Group Note   Group Topic:Animal Assisted Therapy   Group Date: 10/18/2023 Start Time: 0945 End Time: 1030 Facilitators: Genasis Zingale-McCall, LRT,CTRS Location: [address] Dayroom   Animal-Assisted Activity (AAA) Program Checklist/Progress Notes Patient Eligibility Criteria Checklist & Daily Group note for Rec Tx Intervention  AAA/T Program Assumption of Risk Form signed by Patient/ or Parent Legal Guardian Yes  Patient is free of allergies or severe asthma Yes  Patient reports no fear of animals Yes  Patient reports no history of cruelty to animals Yes  Patient understands his/her participation is voluntary Yes  Patient washes hands before animal contact Yes  Patient washes hands after animal contact Yes  Behavioral Response: Engaged   Education: Charity fundraiser, Appropriate Animal Interaction   Education Outcome: Acknowledges education.    Affect/Mood: Appropriate   Participation Level: Engaged   Participation Quality: Independent   Behavior: Appropriate   Speech/Thought Process: Focused   Insight: Good   Judgement: Good   Modes of Intervention: Teaching laboratory technician   Patient Response to Interventions:  Engaged   Education Outcome:  In group clarification offered    Clinical Observations/Individualized Feedback: Pt came in late to group for the last few minutes. Pt was able to engage with Dixie and shared about her pets at home.   Plan: Continue to engage patient in RT group sessions 2-3x/week.   Jazminn Pomales-McCall, LRT,CTRS 10/18/2023 12:14 PM

## 2023-10-18 NOTE — Progress Notes (Signed)
 D: Patient is alert, moderately confused, pleasant, and cooperative. Patient endorses AVH. Denies SI, HI, and verbally contracts for safety. Patient reports nausea.    A: Scheduled medications administered per MD order. PRN zofran  and hydroxyzine  administered. Support provided. Patient educated on safety on the unit and medications. Routine safety checks every 15 minutes. Patient stated understanding to tell nurse about any new physical symptoms. Patient understands to tell staff of any needs.     R: No adverse drug reactions noted. Patient remains safe at this time and will continue to monitor.    10/18/23 0900  Psych Admission Type (Psych Patients Only)  Admission Status Involuntary  Psychosocial Assessment  Patient Complaints Confusion  Eye Contact Fair  Facial Expression Flat;Sad  Affect Sad  Speech Soft  Interaction Cautious  Motor Activity Slow  Appearance/Hygiene Unremarkable  Behavior Characteristics Calm;Cooperative  Mood Depressed  Thought Process  Coherency Disorganized;Loose associations  Content Confabulation  Delusions None reported or observed  Perception Hallucinations  Hallucination Auditory;Visual  Judgment Poor  Confusion Moderate  Danger to Self  Current suicidal ideation? Denies  Agreement Not to Harm Self Yes  Description of Agreement verbal  Danger to Others  Danger to Others None reported or observed

## 2023-10-18 NOTE — Progress Notes (Signed)
 [facility] MD Progress Note Vaping 10/18/2023 9:33 AM Miranda Berger  MRN:  [id number]  Principal Problem: MDD (major depressive disorder), recurrent, severe, with psychosis (HCC) Diagnosis: Principal Problem:   MDD (major depressive disorder), recurrent, severe, with psychosis (HCC)  Reason for admission: This is the first inpatient psychiatric admission for Miranda Berger, a 68 year old Caucasian female with prior psychiatric diagnoses significant for MDD recurrent, severe with psychotic features, OSA, GAD, and prior medical diagnoses significant for degenerative tear of medial meniscus of right knee, aortic stenosis, 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, asthma, LBBB, GERD, and family history of adverse reaction to anesthesia.  Patient presents involuntarily to [facility] for worsening depression and bizarre behaviors.   24-hour chart review: Patient case discussed with interdisciplinary team meeting.  Vital signs without critical values, blood pressure 134/93, pulse 100.  PRNs required of Tylenol  x 1 for pain, hydroxyzine  x 1 for anxiety, Zofran  x 1 for nausea, and trazodone  x 1 for sleep.  Compliant with scheduled psychotropic medication.  Today's assessment notes: On assessment today, the pt reports that her mood is depressed and rates depression as #8/10, with 10 being high severity.  Patient reports, I got up this morning at 6:00 and I felt darkness float over me.  I called on the blood of Jesus to cover me.  Wellbutrin  XL increased to 300 mg p.o. daily for depression.  Speech clear, coherent and with some disorganization.  She denies SI, HI, or VH.  However, continues to endorses auditory hallucination of hearing voices in her head.  She continues on Risperdal  2 mg p.o. twice daily for psychosis, without complaint of side effects.  Reports constipation with last bowel movement on Thursday, 10/13/2023. Colace 100 mg po BID added to treatment plan for constipation.   No other acute discomfort.   Reports anxiety of #10 #10, Hydroxyzine  25 mg p.o. 3 times daily as needed changed to hydroxyzine  25 mg p.o. 3 times daily for anxiety Sleep is stable Appetite is good Concentration is improving Energy level is adequate Denies suicidal thoughts.  Further denies suicidal intent and plan.  Denies having any HI.   Denies having side effects to current psychiatric medications.   We discussed changes to current medication regimen, including increasing Wellbutrin  XL from 150 mg to 300 mg p.o. daily follow-up depression.  And changing hydroxyzine  from as needed to scheduled.     Total Time spent with patient: 45 minutes  Past Psychiatric History: Previous Psych Diagnoses: MDD with psychotic features, OSA, & GAD, Prior inpatient treatment: Denies Current/prior outpatient treatment: Yes, Michelle Gallomore at [facility] counseling Prior rehab hx: Denies Psychotherapy hx: Yes History of suicide: Denies History of homicide or aggression: Denies Psychiatric medication history: Reports trial medication of Remeron , Xanax , and Wellbutrin  Psychiatric medication compliance history: Noncompliant Neuromodulation history: Denies Current Psychiatrist: Darice Molt, NP Current therapist: Rosaline Battle  Past Medical History:  Past Medical History:  Diagnosis Date   Abdominal pain 06/22/2012   Anemia    in the past    Anxiety    Aortic stenosis 06/20/2017   Asthma    Chronic interstitial cystitis 06/22/2012   Costochondral chest pain 04/04/2018   Degenerative tear of medial meniscus of right knee 04/21/2016   Depression    Difficult or painful urination 06/22/2012   Dyspareunia 06/22/2012   Family history of adverse reaction to anesthesia    mom had nausea   Female pelvic pain 06/22/2012   GERD (gastroesophageal reflux disease)  Hepatitis    Hepatitis A in 3rd grade?   High-tone pelvic floor dysfunction 12/26/2013   History of hypertension 12/01/2021    Hypertension    LBBB (left bundle branch block) 08/08/2017   Major depressive disorder, single episode, severe, with psychosis (HCC) 07/11/2018   MDD (major depressive disorder), recurrent severe, 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   Obstructive sleep apnea    Pericarditis 09/12/2017   PONV (postoperative nausea and vomiting)    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 07/26/2017   [employer], size 23   Urinary urgency 06/22/2012    Past Surgical History:  Procedure Laterality Date   ABDOMINAL HYSTERECTOMY     AORTIC VALVE REPLACEMENT N/A 07/26/2017   Procedure: MINIMALLY INVASIVE AORTIC VALVE REPLACEMENT (AVR);  Surgeon: Dusty Sudie DEL, MD;  Location: [facility] OR;  Service: Open Heart Surgery;  Laterality: N/A;   BLADDER SURGERY  2011   bone spur removal Right 08/2022   great toe   CARDIAC CATHETERIZATION     CARPAL TUNNEL RELEASE  2012   CHOLECYSTECTOMY     RIGHT/LEFT HEART CATH AND CORONARY ANGIOGRAPHY N/A 07/08/2017   Procedure: RIGHT/LEFT HEART CATH AND CORONARY ANGIOGRAPHY;  Surgeon: Wonda Sharper, MD;  Location: [facility] INVASIVE CV LAB;  Service: Cardiovascular;  Laterality: N/A;   TEE WITHOUT CARDIOVERSION N/A 07/26/2017   Procedure: TRANSESOPHAGEAL ECHOCARDIOGRAM (TEE);  Surgeon: Dusty Sudie DEL, MD;  Location: [facility] OR;  Service: Open Heart Surgery;  Laterality: N/A;   TONSILLECTOMY     Family History:  Family History  Problem Relation Age of Onset   Stroke Mother    Leukemia Mother    Stroke Father    Sleep apnea Father    Valvular heart disease Brother    Sleep apnea Brother    Heart Problems Maternal Aunt    Valvular heart disease Maternal Aunt    Valvular heart disease Paternal Grandfather    Family Psychiatric  History: See H&P Social History:  Social History   Substance and Sexual Activity  Alcohol Use No   Alcohol/week: 0.0 standard drinks of alcohol     Social History   Substance and Sexual Activity  Drug Use No     Social History   Socioeconomic History   Marital status: Married    Spouse name: Not on file   Number of children: 1   Years of education: BS   Highest education level: Not on file  Occupational History   Not on file  Tobacco Use   Smoking status: Never   Smokeless tobacco: Never  Vaping Use   Vaping status: Never Used  Substance and Sexual Activity   Alcohol use: No    Alcohol/week: 0.0 standard drinks of alcohol   Drug use: No   Sexual activity: Not on file  Other Topics Concern   Not on file  Social History Narrative   Occasionally consumes tea or coffee   Social Drivers of Corporate investment banker Strain: Not on file  Food Insecurity: No Food Insecurity (10/16/2023)   Hunger Vital Sign    Worried About Running Out of Food in the Last Year: Never true    Ran Out of Food in the Last Year: Never true  Transportation Needs: No Transportation Needs (10/16/2023)   PRAPARE - Administrator, Civil Service (Medical): No    Lack of Transportation (Non-Medical): No  Physical Activity: Inactive (10/06/2017)   Exercise Vital Sign    Days of Exercise per Week: 0  days    Minutes of Exercise per Session: 0 min  Stress: Stress Concern Present (10/06/2017)   [employer] of Occupational Health - Occupational Stress Questionnaire    Feeling of Stress : Rather much  Social Connections: Patient Unable To Answer (10/17/2023)   Social Connection and Isolation Panel    Frequency of Communication with Friends and Family: Patient unable to answer    Frequency of Social Gatherings with Friends and Family: Patient unable to answer    Attends Religious Services: Patient unable to answer    Active Member of Clubs or Organizations: Patient unable to answer    Attends Banker Meetings: Patient unable to answer    Marital Status: Patient unable to answer   Additional Social History:   Sleep: Good Estimated Sleeping Duration (Last 24 Hours): 4.25-5.50  hours  Appetite:  Good  Current Medications: Current Facility-Administered Medications  Medication Dose Route Frequency Provider Last Rate Last Admin   acetaminophen  (TYLENOL ) tablet 650 mg  650 mg Oral Q6H PRN Tex Drilling, NP   650 mg at 10/17/23 1302   albuterol  (VENTOLIN  HFA) 108 (90 Base) MCG/ACT inhaler 2 puff  2 puff Inhalation Q6H PRN Nkwenti, Drilling, NP       alum & mag hydroxide-simeth (MAALOX/MYLANTA) 200-200-20 MG/5ML suspension 30 mL  30 mL Oral Q4H PRN Tex Drilling, NP       aspirin  EC tablet 81 mg  81 mg Oral Daily Nkwenti, Doris, NP   81 mg at 10/18/23 9165   buPROPion  (WELLBUTRIN  XL) 24 hr tablet 150 mg  150 mg Oral Daily Monserrat Vidaurri C, FNP   150 mg at 10/18/23 9165   hydrOXYzine  (ATARAX ) tablet 25 mg  25 mg Oral TID PRN Lynea Rollison C, FNP   25 mg at 10/17/23 1512   loperamide  (IMODIUM ) capsule 2-4 mg  2-4 mg Oral PRN Nkwenti, Doris, NP       LORazepam  (ATIVAN ) tablet 1 mg  1 mg Oral Q6H PRN Parker, Alvin S, MD       magnesium  hydroxide (MILK OF MAGNESIA) suspension 30 mL  30 mL Oral Daily PRN Tex Drilling, NP       mirtazapine  (REMERON ) tablet 7.5 mg  7.5 mg Oral QHS Nkwenti, Doris, NP   7.5 mg at 10/17/23 2105   montelukast  (SINGULAIR ) tablet 10 mg  10 mg Oral Daily Nkwenti, Doris, NP   10 mg at 10/18/23 9164   multivitamin with minerals tablet 1 tablet  1 tablet Oral Daily Tex Drilling, NP   1 tablet at 10/18/23 9165   OLANZapine  (ZYPREXA ) injection 5 mg  5 mg Intramuscular TID PRN Tex Drilling, NP       OLANZapine  zydis (ZYPREXA ) disintegrating tablet 5 mg  5 mg Oral TID PRN Tex Drilling, NP       ondansetron  (ZOFRAN -ODT) disintegrating tablet 4 mg  4 mg Oral Q6H PRN Tex Drilling, NP   4 mg at 10/18/23 9165   pantoprazole  (PROTONIX ) EC tablet 40 mg  40 mg Oral Daily Nkwenti, Drilling, NP   40 mg at 10/18/23 0835   thiamine  (Vitamin B-1) tablet 100 mg  100 mg Oral Daily Nkwenti, Doris, NP   100 mg at 10/18/23 9165   traZODone  (DESYREL ) tablet 50 mg  50 mg Oral  QHS PRN Tonimarie Gritz C, FNP   50 mg at 10/17/23 2105    Lab Results:  Results for orders placed or performed during the hospital encounter of 10/16/23 (from the past 48 hours)  HIV  Antibody (routine testing w rflx)     Status: None   Collection Time: 10/17/23  6:41 AM  Result Value Ref Range   HIV Screen 4th Generation wRfx Non Reactive Non Reactive    Comment: Performed at [facility], [address] [place], [place] [address]  Urinalysis, Routine w reflex microscopic -Urine, Clean Catch     Status: Abnormal   Collection Time: 10/17/23  7:05 AM  Result Value Ref Range   Color, Urine YELLOW YELLOW   APPearance CLEAR CLEAR   Specific Gravity, Urine >1.030 (H) 1.005 - 1.030   pH 6.0 5.0 - 8.0   Glucose, UA NEGATIVE NEGATIVE mg/dL   Hgb urine dipstick NEGATIVE NEGATIVE   Bilirubin Urine NEGATIVE NEGATIVE   Ketones, ur NEGATIVE NEGATIVE mg/dL   Protein, ur NEGATIVE NEGATIVE mg/dL   Nitrite NEGATIVE NEGATIVE   Leukocytes,Ua NEGATIVE NEGATIVE    Comment: Microscopic not done on urines with negative protein, blood, leukocytes, nitrite, or glucose < 500 mg/dL. Performed at [facility], [address], [place], [place] [address]   Rapid urine drug screen (hospital performed)     Status: Abnormal   Collection Time: 10/17/23  7:05 AM  Result Value Ref Range   Opiates NONE DETECTED NONE DETECTED   Cocaine NONE DETECTED NONE DETECTED   Benzodiazepines POSITIVE (A) NONE DETECTED   Amphetamines NONE DETECTED NONE DETECTED   Tetrahydrocannabinol NONE DETECTED NONE DETECTED   Barbiturates NONE DETECTED NONE DETECTED    Comment: (NOTE) DRUG SCREEN FOR MEDICAL PURPOSES ONLY.  IF CONFIRMATION IS NEEDED FOR ANY PURPOSE, NOTIFY LAB WITHIN 5 DAYS.  LOWEST DETECTABLE LIMITS FOR URINE DRUG SCREEN Drug Class                     Cutoff (ng/mL) Amphetamine and metabolites    1000 Barbiturate and metabolites    200 Benzodiazepine                 200 Opiates and  metabolites        300 Cocaine and metabolites        300 THC                            50 Performed at [facility], [address] [place], [place] [address]   Folate     Status: None   Collection Time: 10/17/23  6:24 PM  Result Value Ref Range   Folate 18.4 >5.9 ng/mL    Comment: Performed at [facility], [address] [place], [place] [address]  VITAMIN D  25 Hydroxy (Vit-D Deficiency, Fractures)     Status: None   Collection Time: 10/17/23  6:24 PM  Result Value Ref Range   Vit D, 25-Hydroxy 45.47 30 - 100 ng/mL    Comment: (NOTE) Vitamin D  deficiency has been defined by the Institute of Medicine  and an Endocrine Society practice guideline as a level of serum 25-OH  vitamin D  less than 20 ng/mL (1,2). The Endocrine Society went on to  further define vitamin D  insufficiency as a level between 21 and 29  ng/mL (2).  1. IOM (Institute of Medicine). 2010. Dietary reference intakes for  calcium  and D. [place]: [employer]. 2. Holick MF, Binkley [place], Bischoff-Ferrari HA, et al. Evaluation,  treatment, and prevention of vitamin D  deficiency: an Endocrine  Society clinical practice guideline, JCEM. 2011 Jul; 96(7): 1911-30.  Performed at [facility], [address], [place], [place] [address]   Vitamin B12     Status: None   Collection Time: 10/17/23  6:24 PM  Result Value Ref Range   Vitamin B-12 635 180 - 914 pg/mL    Comment: (NOTE) This assay is not validated for testing neonatal or myeloproliferative syndrome specimens for Vitamin B12 levels. Performed at [facility], [address] [place], [place] [address]   TSH     Status: None   Collection Time: 10/17/23  6:24 PM  Result Value Ref Range   TSH 2.469 0.350 - 4.500 uIU/mL    Comment: Performed by a 3rd Generation assay with a functional sensitivity of <=0.01 uIU/mL. Performed at [facility], [address], [place], [place] [address]   RPR     Status: None   Collection Time: 10/17/23  6:24 PM  Result Value Ref Range   RPR Ser Ql NON REACTIVE NON REACTIVE    Comment: Performed at [facility], [address] [place], [place] [address]  HIV Antibody (routine testing w rflx)     Status: None   Collection Time: 10/17/23  6:24 PM  Result Value Ref Range   HIV Screen 4th Generation wRfx Non Reactive Non Reactive    Comment: Performed at [facility], [address], [place], [place] [address]  Gamma GT     Status: None   Collection Time: 10/17/23  6:24 PM  Result Value Ref Range   GGT 16 7 - 50 U/L    Comment: Performed at [facility], [address] [place], [place] [address]   Blood Alcohol level:  Lab Results  Component Value Date   [facility] <15 10/16/2023   ETH <15 10/13/2023   Metabolic Disorder Labs: Lab Results  Component Value Date   HGBA1C 5.6 10/16/2023   MPG 114.02 10/16/2023   MPG 111.15 07/22/2017   No results found for: PROLACTIN Lab Results  Component Value Date   CHOL 209 (H) 10/16/2023   TRIG 86 10/16/2023   HDL 83 10/16/2023   CHOLHDL 2.5 10/16/2023   VLDL 17 10/16/2023   LDLCALC 109 (H) 10/16/2023   Physical Findings: AIMS:  ,  ,  ,  ,  ,  ,   CIWA:  CIWA-Ar Total: 0 COWS:     Musculoskeletal: Strength & Muscle Tone: within normal limits Gait & Station: normal Patient leans: N/A  Psychiatric Specialty Exam:  Presentation  General Appearance:  Casual  Eye Contact: Good  Speech: Clear and Coherent  Speech Volume: Normal  Handedness: Right  Mood and Affect  Mood: Anxious; Depressed  Affect: Congruent  Thought Process  Thought Processes: Coherent  Descriptions of Associations:Intact  Orientation:Full (Time, Place and Person)  Thought Content:Logical  History of Schizophrenia/Schizoaffective disorder:No  Duration of Psychotic Symptoms:Less than six months  Hallucinations:Hallucinations:  Visual Description of Auditory Hallucinations: Voices telling me I am not good enough and I am not going to make it Description of Visual Hallucinations: Dark Patches  Ideas of Reference:Paranoia  Suicidal Thoughts:Suicidal Thoughts: No SI Passive Intent and/or Plan: -- (Denies)  Homicidal Thoughts:Homicidal Thoughts: No  Sensorium  Memory: Immediate Fair; Recent Fair  Judgment: Fair  Insight: Fair  Executive Functions  Concentration: Good  Attention Span: Good  Recall: Fair  Fund of Knowledge: Fair  Language: Good  Psychomotor Activity  Psychomotor Activity: Psychomotor Activity: Normal  Assets  Assets: Communication Skills; Desire for  Improvement; Physical Health; Resilience  Sleep  Sleep: Sleep: Good Number of Hours of Sleep: 5.25  Physical Exam: Physical Exam Vitals and nursing note reviewed.  Constitutional:      Appearance: She is normal weight.  HENT:     Head: Normocephalic.     Right Ear: External ear normal.     Left Ear: External ear normal.     Nose: Nose normal.     Mouth/Throat:     Mouth: Mucous membranes are moist.     Pharynx: Oropharynx is clear.  Eyes:     Extraocular Movements: Extraocular movements intact.  Cardiovascular:     Rate and Rhythm: Normal rate.     Pulses: Normal pulses.  Pulmonary:     Effort: Pulmonary effort is normal. No respiratory distress.  Abdominal:     Comments: Deferred yes  Genitourinary:    Comments: Deferred Musculoskeletal:        General: Normal range of motion.     Cervical back: Normal range of motion.  Skin:    General: Skin is warm.  Neurological:     General: No focal deficit present.     Mental Status: She is alert and oriented to person, place, and time.  Psychiatric:        Mood and Affect: Mood normal.        Behavior: Behavior normal.    Review of Systems  Constitutional:  Negative for chills and fever.  HENT:  Negative for sore throat.   Eyes:  Negative for blurred  vision.  Respiratory:  Negative for cough, sputum production, shortness of breath and wheezing.   Cardiovascular:  Negative for chest pain and palpitations.  Gastrointestinal:  Positive for nausea (As needed of Zofran  given for nausea). Negative for heartburn.  Genitourinary:  Negative for dysuria, frequency and urgency.  Musculoskeletal:  Negative for falls.  Skin:  Negative for itching and rash.  Neurological:  Negative for dizziness and headaches.  Endo/Heme/Allergies:        See allergy listing  Psychiatric/Behavioral:  Positive for depression, hallucinations and suicidal ideas. Negative for substance abuse. The patient is nervous/anxious. The patient does not have insomnia.    Blood pressure (!) 134/93, pulse 100, temperature 98.2 F (36.8 C), temperature source Oral, resp. rate 16, height 5' 4 (1.626 m), weight 72 kg, SpO2 98%. Body mass index is 27.26 kg/m.  Treatment Plan Summary: Daily contact with patient to assess and evaluate symptoms and progress in treatment and Medication management  Observation Level/Precautions:  15 minute checks  Laboratory:   CBC: WBC 10.7 elevated, neutrophil 7.8 elevated, otherwise normal. Chemistry Profile: Glucose 102 elevated, creatinine 1.16 elevated, GFR estimated none African-American 52 low, otherwise normal.   Lipid profile: Cholesterol 209 high, LDL 109 high, otherwise normal.  Glucose HCG: Not applicable UDS: Positive for benzodiazepine UA: Specific gravity greater than 1.030 elevated, otherwise normal  Vitamin B-12: Ordered Folic Acid : Ordered GGT: Ordered HbAIC: Ordered B12: Ordered Vitamin D  25-hydroxy: Ordered TSH: Ordered Hemoglobin A1c: Ordered   Psychotherapy: Yes  Medications: See MAR  Consultations: Pending  Discharge Concerns: Safety  Estimated LOS: 3 to 7 days  Other:      Assessment:  This is the first inpatient psychiatric admission for Jenniferlynn Saad, a 68 year old Caucasian female with prior psychiatric  diagnoses significant for MDD recurrent, severe with psychotic features, OSA, GAD, and prior medical diagnoses significant for degenerative tear of medial meniscus of right knee, aortic stenosis, S/P minimally invasive aortic valve replacement with bioprosthetic  valve, asthma, LBBB, GERD, and family history of adverse reaction to anesthesia.  Patient presents involuntarily to [facility] for worsening depression and bizarre behaviors.    May physician Treatment Plan for Primary Diagnosis: MDD (major depressive disorder), recurrent, severe, with psychosis (HCC)   Plan: Medications: --Continue Remeron  tablets 7.5 mg p.o. daily at bedtime for depression and sleep --Increase Seroquel  Wellbutrin  XL 24-hour tablet for from 150 mg to 300 mg for  p.o. daily for depression. -- Continue Risperdal  2 mg p.o. twice daily for psychosis  -- Change hydroxyzine  25 mg p.o. 3 times daily as needed for anxiety  to scheduled TID --Continue trazodone  50 mg tablet p.o. nightly as needed for insomnia   Continue Ativan  detox protocol   [facility] agitation protocol    Medications for other medical problems: --Continue with insulin  HFA 108 90 base MCG/ACT inhaler 2 puffs every 6 hours as needed for anxiety --Continue aspirin  EC tablet 81 mg p.o. daily for platelet aggregation prevention --Continue montelukast  tablet 10 mg p.o. daily for asthma --Continue Protonix  EC tablet 40 mg p.o. daily for GERD --Continue MVI tablets po daily for supplement --Initiate Colace 100 mg po BID for constipation   Other PRN Medications  -Acetaminophen  650 mg every 6 as needed/mild pain  -Maalox 30 mL oral every 4 as needed/digestion  -Magnesium  hydroxide 30 mL daily as needed/mild constipation    --The risks/benefits/side-effects/alternatives to this medication were discussed in detail with the patient and time was given for questions. The patient consents to medication trial.   -- Metabolic profile and EKG monitoring obtained while on an  atypical antipsychotic (BMI: Lipid Panel: HbgA1c: QTc:)   -- Encouraged patient to participate in unit milieu and in scheduled group therapies    Safety and Monitoring:  Voluntary admission to inpatient psychiatric unit for safety, stabilization and treatment  Daily contact with patient to assess and evaluate symptoms and progress in treatment  Patient's case to be discussed in multi-disciplinary team meeting  Observation Level : q15 minute checks  Vital signs: q12 hours  Precautions: suicide, but pt currently verbally contracts for safety on unit?    Discharge Planning:  Social work and case management to assist with discharge planning and identification of hospital follow-up needs prior to discharge  Estimated LOS: 5-7?days  Discharge Concerns: Need to establisDoes effecxor cause dizziness?  Safety plan; Medication compliance and effectiveness  Discharge Goals: Return home with outpatient referrals for mental health follow-up including medication management/psychotherapy.    Long Term Goal(s): Improvement in symptoms so as ready for discharge   Short Term Goals: Ability to identify changes in lifestyle to reduce recurrence of condition will improve, Ability to verbalize feelings will improve, Ability to disclose and discuss suicidal ideas, Ability to demonstrate self-control will improve, Ability to identify and develop effective coping behaviors will improve, Ability to maintain clinical measurements within normal limits will improve, Compliance with prescribed medications will improve, and Ability to identify triggers associated with substance abuse/mental health issues will improve   Physician Treatment Plan for Secondary Diagnosis: Principal Problem:   MDD (major depressive disorder), recurrent, severe, with psychosis (HCC)   I certify that inpatient services furnished can reasonably be expected to improve the patient's condition.    Ellouise JAYSON Azure, FNP 10/18/2023, 9:33 AM

## 2023-10-18 NOTE — Group Note (Signed)
 Date:  10/18/2023 Time:  9:13 PM  Group Topic/Focus:  Wrap-Up Group:   The focus of this group is to help patients review their daily goal of treatment and discuss progress on daily workbooks.    Participation Level:  Did Not Attend  Participation Quality:  N/A  Affect:  N/A  Cognitive:  N/A  Insight: None  Engagement in Group:  N/A  Modes of Intervention:  N/A  Additional Comments:  Patient was encouraged but did not attend.   Eward Mace 10/18/2023, 9:13 PM

## 2023-10-19 MED ORDER — CLONIDINE HCL 0.1 MG PO TABS
0.1000 mg | ORAL_TABLET | Freq: Once | ORAL | Status: AC
  Administered 2023-10-19: 0.1 mg via ORAL
  Filled 2023-10-19: qty 1

## 2023-10-19 MED ORDER — LISINOPRIL 5 MG PO TABS
5.0000 mg | ORAL_TABLET | Freq: Every day | ORAL | Status: DC
  Administered 2023-10-19 – 2023-10-24 (×6): 5 mg via ORAL
  Filled 2023-10-19 (×6): qty 1

## 2023-10-19 MED ORDER — RISPERIDONE 3 MG PO TBDP
3.0000 mg | ORAL_TABLET | Freq: Two times a day (BID) | ORAL | Status: DC
  Administered 2023-10-19 – 2023-10-24 (×10): 3 mg via ORAL
  Filled 2023-10-19 (×10): qty 1

## 2023-10-19 MED ORDER — SODIUM CHLORIDE 0.9 % IN NEBU
INHALATION_SOLUTION | RESPIRATORY_TRACT | Status: AC
  Filled 2023-10-19: qty 3

## 2023-10-19 NOTE — Group Note (Signed)
 Date:  10/19/2023 Time:  9:18 AM  Group Topic/Focus:  Goals Group:   [name]    Participation Level:  Active  Participation Quality:  Appropriate  Affect:  Appropriate  Cognitive:  Appropriate  Insight: Appropriate  Engagement in Group:  Engaged  Modes of Intervention:  Discussion  Additional Comments:  pt wants to see her doctor, eat her meals and talk to the chaplin  Nat Rummer 10/19/2023, 9:18 AM

## 2023-10-19 NOTE — Progress Notes (Signed)
(  Sleep Hours) - 6.75 (Any PRNs that were needed, meds refused, or side effects to meds)- PRN Tylenol , PRN Zofran , and PRN Trazodone . (Any disturbances and when (visitation, over night)- None (Concerns raised by the patient)-  None (SI/HI/AVH)- Denies HI and AH. Endorses VH. Denies SI and contracts for safety.

## 2023-10-19 NOTE — Group Note (Signed)
 Recreation Therapy Group Note   Group Topic:Communication  Group Date: 10/19/2023 Start Time: 0935 End Time: 1000 Facilitators: Azlee Monforte-McCall, LRT,CTRS Location: [address] Day[address]   Group Topic: Communication, Problem Solving   Goal Area(s) Addresses:  Patient will effectively listen to complete activity.  Patient will identify communication skills used to make activity successful.  Patient will identify how skills used during activity can be used to reach post d/c goals.    Behavioral Response:    Intervention: Building surveyor Activity - Geometric pattern cards, pencils, blank paper    Activity: Geometric Drawings.  Three volunteers from the peer group will be shown an abstract picture with a particular arrangement of geometrical shapes.  Each round, one 'speaker' will describe the pattern, as accurately as possible without revealing the image to the group.  The remaining group members will listen and draw the picture to reflect how it is described to them. Patients with the role of 'listener' cannot ask clarifying questions but, may request that the speaker repeat a direction. Once the drawings are complete, the presenter will show the rest of the group the picture and compare how close each person came to drawing the picture. LRT will facilitate a post-activity discussion regarding effective communication and the importance of planning, listening, and asking for clarification in daily interactions with others.  Education: Environmental consultant, Active listening, Support systems, Discharge planning  Education Outcome: Acknowledges understanding/In group clarification offered/Needs additional education.    Affect/Mood: N/A   Participation Level: Did not attend    Clinical Observations/Individualized Feedback:     Plan: Continue to engage patient in RT group sessions 2-3x/week.   Breland Trouten-McCall, LRT,CTRS 10/19/2023 11:06 AM

## 2023-10-19 NOTE — Group Note (Signed)
 Date:  10/19/2023 Time:  10:36 PM  Group Topic/Focus:  NA/ AA Group    Participation Level:  Did Not Attend  Participation Quality:  Pt did not attend   Affect:  patient did not attend   Cognitive:  Lacking  Insight: None  Engagement in Group:  None  Modes of Intervention:  Discussion  Additional Comments:  Patient did not attend group  Miranda Berger 10/19/2023, 10:36 PM

## 2023-10-19 NOTE — Progress Notes (Signed)
   10/19/23 0840  Psych Admission Type (Psych Patients Only)  Admission Status Involuntary  Psychosocial Assessment  Patient Complaints Anxiety;Confusion  Eye Contact Fair  Facial Expression Flat;Anxious  Affect Preoccupied  Speech Soft  Interaction Cautious;Guarded;Minimal  Motor Activity Slow  Appearance/Hygiene Unremarkable  Behavior Characteristics Cooperative;Guarded  Mood Anxious;Preoccupied  Thought Process  Coherency Loose associations;Circumstantial  Content Confabulation;Preoccupation  Delusions None reported or observed  Perception Hallucinations  Hallucination Auditory;Visual  Judgment Poor  Confusion Moderate  Danger to Self  Current suicidal ideation? Denies  Agreement Not to Harm Self Yes  Description of Agreement Verbal  Danger to Others  Danger to Others None reported or observed

## 2023-10-19 NOTE — Group Note (Signed)
 Date:  10/19/2023 Time:  5:53 PM  Group Topic/Focus:  Coping With Mental Health Crisis:   The purpose of this group is to help patients identify strategies for coping with mental health crisis.  Group discusses possible causes of crisis and ways to manage them effectively. Developing a Wellness Toolbox:   The focus of this group is to help patients develop a wellness toolbox with skills and strategies to promote recovery upon discharge. Identifying Needs:   The focus of this group is to help patients identify their personal needs that have been historically problematic and identify healthy behaviors to address their needs.    Participation Level:  Did Not Attend  Participation Quality:    Affect:    Cognitive:    Insight:   Engagement in Group:    Modes of Intervention:    Additional Comments:    Eleanor JAYSON Metro 10/19/2023, 5:53 PM

## 2023-10-19 NOTE — Plan of Care (Signed)
   Problem: Safety: Goal: Periods of time without injury will increase Outcome: Progressing

## 2023-10-19 NOTE — Progress Notes (Signed)
 [facility] MD Progress Note Vaping 10/19/2023 9:49 AM Glendora Clouatre  MRN:  [id number]  Principal Problem: MDD (major depressive disorder), recurrent, severe, with psychosis (HCC)  Diagnosis: Principal Problem:   MDD (major depressive disorder), recurrent, severe, with psychosis (HCC)  Reason for admission: This is the first inpatient psychiatric admission for Miranda Berger, a 68 year old Caucasian female with prior psychiatric diagnoses significant for MDD recurrent, severe with psychotic features, OSA, GAD, and prior medical diagnoses significant for degenerative tear of medial meniscus of right knee, aortic stenosis, 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, asthma, LBBB, GERD, and family history of adverse reaction to anesthesia.  Patient presents involuntarily to [facility] for worsening depression and bizarre behaviors.   Daily notes: Patient is seen. Chart reviewed. The chart findings discussed with the treatment team. She presents alert, oriented & aware of situation. Patient is being encouraged to be visible on the unit, attending group sessions. However, she reports, I'm not doing well mentally & physically. I feel exhausted physically. Mentally, I feel very depressed. I'm hearing voices inside this hospital & outside this hospital as well. I'm seeing people crawling on the ceiling last night. I'm here because I was dealing with a lot of stress at home. My husband went from having dementia to being diagnosed with Kidney cancer. I recently lost my sister. There are other family issues going on. I found it very difficulty to manage my mood or my personal life. I needed a safe place to be & that is the reason I'm here. I'm taking my medicines. They are helping some. And yet, I'm still sad & worried about my husband, Patient currently denies any SIHI, delusional thoughts or paranoia. She is taking & tolerating her treatment regimen. Denies any side effects. Reviewed  vital signs, blood pressure readings both systolic & diastolic pressure remain elevated. Initiated on a low dose of lisinopril  5 mg po daily. Although says she is physically weak, patient is instructed & encouraged to walk from her room to Dayroom & she was able to do as such. Continue current plan of care as already in progress.   Total Time spent with patient: 45 minutes  Past Psychiatric History: Previous Psych Diagnoses: MDD with psychotic features, OSA, & GAD, Prior inpatient treatment: Denies Current/prior outpatient treatment: Yes, Michelle Gallomore at [facility] counseling Prior rehab hx: Denies Psychotherapy hx: Yes History of suicide: Denies History of homicide or aggression: Denies Psychiatric medication history: Reports trial medication of Remeron , Xanax , and Wellbutrin  Psychiatric medication compliance history: Noncompliant Neuromodulation history: Denies Current Psychiatrist: Darice Molt, NP Current therapist: Rosaline Battle  Past Medical History:  Past Medical History:  Diagnosis Date   Abdominal pain 06/22/2012   Anemia    in the past    Anxiety    Aortic stenosis 06/20/2017   Asthma    Chronic interstitial cystitis 06/22/2012   Costochondral chest pain 04/04/2018   Degenerative tear of medial meniscus of right knee 04/21/2016   Depression    Difficult or painful urination 06/22/2012   Dyspareunia 06/22/2012   Family history of adverse reaction to anesthesia    mom had nausea   Female pelvic pain 06/22/2012   GERD (gastroesophageal reflux disease)    Hepatitis    Hepatitis A in 3rd grade?   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 07/26/2017   [employer], size 23   Urinary urgency 06/22/2012    Past Surgical History:  Procedure Laterality Date   ABDOMINAL HYSTERECTOMY     AORTIC VALVE REPLACEMENT N/A 07/26/2017   Procedure: MINIMALLY INVASIVE AORTIC VALVE REPLACEMENT (AVR);  Surgeon: Dusty Sudie DEL, MD;  Location: [facility] OR;  Service: Open Heart Surgery;  Laterality: N/A;   BLADDER SURGERY  2011   bone spur removal Right 08/2022   great toe   CARDIAC CATHETERIZATION     CARPAL TUNNEL RELEASE  2012   CHOLECYSTECTOMY     RIGHT/LEFT HEART CATH AND CORONARY ANGIOGRAPHY N/A 07/08/2017   Procedure: RIGHT/LEFT HEART CATH AND CORONARY ANGIOGRAPHY;  Surgeon: Wonda Sharper, MD;  Location: [facility] INVASIVE CV LAB;  Service: Cardiovascular;  Laterality: N/A;   TEE WITHOUT CARDIOVERSION N/A 07/26/2017   Procedure: TRANSESOPHAGEAL ECHOCARDIOGRAM (TEE);  Surgeon: Dusty Sudie DEL, MD;  Location: [facility] OR;  Service: Open Heart Surgery;  Laterality: N/A;   TONSILLECTOMY     Family History:  Family History  Problem Relation Age of Onset   Stroke Mother    Leukemia Mother    Stroke Father    Sleep apnea Father    Valvular heart disease Brother    Sleep apnea Brother    Heart Problems Maternal Aunt    Valvular heart disease Maternal Aunt    Valvular heart disease Paternal Grandfather    Family Psychiatric  History: See H&P Social History:  Social History   Substance and Sexual Activity  Alcohol Use No   Alcohol/week: 0.0 standard drinks of alcohol     Social History   Substance and Sexual Activity  Drug Use No    Social History   Socioeconomic History   Marital status: Married    Spouse name: Not on file   Number of children: 1   Years of education: BS   Highest education level: Not on file  Occupational History   Not on file  Tobacco Use   Smoking status: Never    Smokeless tobacco: Never  Vaping Use   Vaping status: Never Used  Substance and Sexual Activity   Alcohol use: No    Alcohol/week: 0.0 standard drinks of alcohol   Drug use: No   Sexual activity: Not on file  Other Topics Concern   Not on file  Social History Narrative   Occasionally consumes tea or coffee   Social Drivers of Corporate investment banker Strain: Not on file  Food Insecurity: No Food Insecurity (10/16/2023)   Hunger Vital Sign    Worried About Running Out of Food in the Last Year: Never true    Ran Out of Food in the Last Year: Never true  Transportation Needs: No Transportation Needs (10/16/2023)   PRAPARE - Administrator, Civil Service (Medical): No    Lack of Transportation (Non-Medical): No  Physical Activity: Inactive (10/06/2017)   Exercise Vital Sign    Days of Exercise per Week: 0 days    Minutes of Exercise per Session: 0 min  Stress: Stress Concern Present (10/06/2017)   [employer] of Occupational Health - Occupational Stress Questionnaire    Feeling of Stress : Rather much  Social Connections: Patient Unable To Answer (10/17/2023)   Social  Connection and Isolation Panel    Frequency of Communication with Friends and Family: Patient unable to answer    Frequency of Social Gatherings with Friends and Family: Patient unable to answer    Attends Religious Services: Patient unable to answer    Active Member of Clubs or Organizations: Patient unable to answer    Attends Banker Meetings: Patient unable to answer    Marital Status: Patient unable to answer   Additional Social History:   Sleep: Good Estimated Sleeping Duration (Last 24 Hours): 5.50-6.25 hours  Appetite:  Good  Current Medications: Current Facility-Administered Medications  Medication Dose Route Frequency Provider Last Rate Last Admin   acetaminophen  (TYLENOL ) tablet 650 mg  650 mg Oral Q6H PRN Tex Drilling, NP   650 mg at 10/18/23 2126   albuterol   (VENTOLIN  HFA) 108 (90 Base) MCG/ACT inhaler 2 puff  2 puff Inhalation Q6H PRN Tex Drilling, NP       alum & mag hydroxide-simeth (MAALOX/MYLANTA) 200-200-20 MG/5ML suspension 30 mL  30 mL Oral Q4H PRN Tex Drilling, NP       aspirin  EC tablet 81 mg  81 mg Oral Daily Nkwenti, Doris, NP   81 mg at 10/19/23 0840   buPROPion  (WELLBUTRIN  XL) 24 hr tablet 300 mg  300 mg Oral Daily Ntuen, Tina C, FNP   300 mg at 10/19/23 0840   docusate sodium  (COLACE) capsule 100 mg  100 mg Oral BID Parker, Alvin S, MD   100 mg at 10/19/23 0840   hydrOXYzine  (ATARAX ) tablet 25 mg  25 mg Oral TID Ntuen, Tina C, FNP   25 mg at 10/19/23 0840   loperamide  (IMODIUM ) capsule 2-4 mg  2-4 mg Oral PRN Nkwenti, Doris, NP       LORazepam  (ATIVAN ) tablet 1 mg  1 mg Oral Q6H PRN Parker, Alvin S, MD       magnesium  hydroxide (MILK OF MAGNESIA) suspension 30 mL  30 mL Oral Daily PRN Tex Drilling, NP       mirtazapine  (REMERON ) tablet 7.5 mg  7.5 mg Oral QHS Nkwenti, Doris, NP   7.5 mg at 10/18/23 2127   montelukast  (SINGULAIR ) tablet 10 mg  10 mg Oral Daily Nkwenti, Doris, NP   10 mg at 10/19/23 0840   multivitamin with minerals tablet 1 tablet  1 tablet Oral Daily Tex Drilling, NP   1 tablet at 10/19/23 0840   OLANZapine  (ZYPREXA ) injection 5 mg  5 mg Intramuscular TID PRN Tex Drilling, NP       OLANZapine  zydis (ZYPREXA ) disintegrating tablet 5 mg  5 mg Oral TID PRN Tex Drilling, NP       ondansetron  (ZOFRAN -ODT) disintegrating tablet 4 mg  4 mg Oral Q6H PRN Tex Drilling, NP   4 mg at 10/18/23 2127   pantoprazole  (PROTONIX ) EC tablet 40 mg  40 mg Oral Daily Nkwenti, Drilling, NP   40 mg at 10/19/23 0840   risperiDONE  (RISPERDAL  M-TABS) disintegrating tablet 2 mg  2 mg Oral BID Parker, Alvin S, MD   2 mg at 10/19/23 0840   thiamine  (Vitamin B-1) tablet 100 mg  100 mg Oral Daily Nkwenti, Doris, NP   100 mg at 10/19/23 0840   traZODone  (DESYREL ) tablet 50 mg  50 mg Oral QHS PRN Ntuen, Tina C, FNP   50 mg at 10/18/23 2126     Lab Results:  Results for orders placed or performed during the hospital encounter of 10/16/23 (from the past 48 hours)  Folate  Status: None   Collection Time: 10/17/23  6:24 PM  Result Value Ref Range   Folate 18.4 >5.9 ng/mL    Comment: Performed at [facility], [address], [place], [place] [address]  VITAMIN D  25 Hydroxy (Vit-D Deficiency, Fractures)     Status: None   Collection Time: 10/17/23  6:24 PM  Result Value Ref Range   Vit D, 25-Hydroxy 45.47 30 - 100 ng/mL    Comment: (NOTE) Vitamin D  deficiency has been defined by the Institute of Medicine  and an Endocrine Society practice guideline as a level of serum 25-OH  vitamin D  less than 20 ng/mL (1,2). The Endocrine Society went on to  further define vitamin D  insufficiency as a level between 21 and 29  ng/mL (2).  1. IOM (Institute of Medicine). 2010. Dietary reference intakes for  calcium  and D. [place]: [employer]. 2. Holick MF, Binkley , Bischoff-Ferrari HA, et al. Evaluation,  treatment, and prevention of vitamin D  deficiency: an Endocrine  Society clinical practice guideline, JCEM. 2011 Jul; 96(7): 1911-30.  Performed at [facility], [address], [place], [place] [address]   Vitamin B12     Status: None   Collection Time: 10/17/23  6:24 PM  Result Value Ref Range   Vitamin B-12 635 180 - 914 pg/mL    Comment: (NOTE) This assay is not validated for testing neonatal or myeloproliferative syndrome specimens for Vitamin B12 levels. Performed at [facility], [address] [place], [place] [address]   TSH     Status: None   Collection Time: 10/17/23  6:24 PM  Result Value Ref Range   TSH 2.469 0.350 - 4.500 uIU/mL    Comment: Performed by a 3rd Generation assay with a functional sensitivity of <=0.01 uIU/mL. Performed at [facility], [address] [place], [place] [address]   RPR     Status: None    Collection Time: 10/17/23  6:24 PM  Result Value Ref Range   RPR Ser Ql NON REACTIVE NON REACTIVE    Comment: Performed at [facility], [address], [place], [place] [address]  HIV Antibody (routine testing w rflx)     Status: None   Collection Time: 10/17/23  6:24 PM  Result Value Ref Range   HIV Screen 4th Generation wRfx Non Reactive Non Reactive    Comment: Performed at [facility], [address] [place], [place] [address]  Gamma GT     Status: None   Collection Time: 10/17/23  6:24 PM  Result Value Ref Range   GGT 16 7 - 50 U/L    Comment: Performed at [facility], [address] [place], [place] [address]   Blood Alcohol level:  Lab Results  Component Value Date   [facility] <15 10/16/2023   ETH <15 10/13/2023   Metabolic Disorder Labs: Lab Results  Component Value Date   HGBA1C 5.6 10/16/2023   MPG 114.02 10/16/2023   MPG 111.15 07/22/2017   No results found for: PROLACTIN Lab Results  Component Value Date   CHOL 209 (H) 10/16/2023   TRIG 86 10/16/2023   HDL 83 10/16/2023   CHOLHDL 2.5 10/16/2023   VLDL 17 10/16/2023   LDLCALC 109 (H) 10/16/2023   Physical Findings: AIMS:  ,  ,  ,  ,  ,  ,   CIWA:  CIWA-Ar Total: 0 COWS:     Musculoskeletal: Strength &  Muscle Tone: within normal limits Gait & Station: normal Patient leans: N/A  Psychiatric Specialty Exam:  Presentation  General Appearance:  Casual  Eye Contact: Good  Speech: Clear and Coherent  Speech Volume: Normal  Handedness: Right  Mood and Affect  Mood: Anxious; Depressed  Affect: Congruent  Thought Process  Thought Processes: Coherent  Descriptions of Associations:Intact  Orientation:Full (Time, Place and Person)  Thought Content:Logical  History of Schizophrenia/Schizoaffective disorder:No  Duration of Psychotic Symptoms:Less than six months  Hallucinations:Hallucinations: Visual Description of Auditory Hallucinations: Voices telling me I am  not good enough and I am not going to make it Description of Visual Hallucinations: Dark Patches  Ideas of Reference:Paranoia  Suicidal Thoughts:Suicidal Thoughts: No SI Passive Intent and/or Plan: -- (Denies)  Homicidal Thoughts:Homicidal Thoughts: No  Sensorium  Memory: Immediate Fair; Recent Fair  Judgment: Fair  Insight: Fair  Executive Functions  Concentration: Good  Attention Span: Good  Recall: Fair  Fund of Knowledge: Fair  Language: Good  Psychomotor Activity  Psychomotor Activity: Psychomotor Activity: Normal  Assets  Assets: Communication Skills; Desire for Improvement; Physical Health; Resilience  Sleep  Sleep: Sleep: Good Number of Hours of Sleep: 5.25  Physical Exam: Physical Exam Vitals and nursing note reviewed.  Constitutional:      Appearance: She is normal weight.  HENT:     Head: Normocephalic.     Right Ear: External ear normal.     Left Ear: External ear normal.     Nose: Nose normal.     Mouth/Throat:     Mouth: Mucous membranes are moist.     Pharynx: Oropharynx is clear.  Eyes:     Extraocular Movements: Extraocular movements intact.  Cardiovascular:     Rate and Rhythm: Normal rate.     Pulses: Normal pulses.  Pulmonary:     Effort: Pulmonary effort is normal. No respiratory distress.  Abdominal:     Comments: Deferred yes  Genitourinary:    Comments: Deferred Musculoskeletal:        General: Normal range of motion.     Cervical back: Normal range of motion.  Skin:    General: Skin is warm.  Neurological:     General: No focal deficit present.     Mental Status: She is alert and oriented to person, place, and time.  Psychiatric:        Mood and Affect: Mood normal.        Behavior: Behavior normal.    Review of Systems  Constitutional:  Negative for chills and fever.  HENT:  Negative for sore throat.   Eyes:  Negative for blurred vision.  Respiratory:  Negative for cough, sputum production, shortness  of breath and wheezing.   Cardiovascular:  Negative for chest pain and palpitations.  Gastrointestinal:  Positive for nausea (As needed of Zofran  given for nausea). Negative for heartburn.  Genitourinary:  Negative for dysuria, frequency and urgency.  Musculoskeletal:  Negative for falls.  Skin:  Negative for itching and rash.  Neurological:  Negative for dizziness and headaches.  Endo/Heme/Allergies:        See allergy listing  Psychiatric/Behavioral:  Positive for depression, hallucinations and suicidal ideas. Negative for substance abuse. The patient is nervous/anxious. The patient does not have insomnia.    Blood pressure (!) 144/90, pulse (!) 110, temperature 97.9 F (36.6 C), temperature source Oral, resp. rate 16, height 5' 4 (1.626 m), weight 72 kg, SpO2 99%. Body mass index is 27.26 kg/m.  Treatment Plan Summary: Daily contact  with patient to assess and evaluate symptoms and progress in treatment and Medication management  Continue inpatient hospitalization.  Will continue today 10/19/2023 plan as below except where it is noted.   Principal/active diagnoses.  MDD (major depressive disorder), recurrent, severe, with psychosis (HCC)  Plan: The risks/benefits/side-effects/alternatives to the medications in use were discussed in detail with the patient and time was given for patient's questions. The patient consents to medication trial.   Continue Mirtazapine  7.5 mg po Q hs for insomnia. Continue Risperdal  M-tabs 3 mg po bid for mood control.  Continue Hydroxyzine  25 mg po tid prn for anxiety.  Continue Trazodone  50 mg po Q bedtime prn for insomnia.  Continue the prn CIWA as recommended for alcohol/benzodiazepine withdrawal management.  Agitation protocols.  Continue as recommended (see MAR).   --Continue albuterol  inhaler 2 puffs Q 6 hrs prn for SOB. --Continue aspirin  81 mg po Q daily for heart health. --Continue montelukast  tablet 10 mg p.o. daily for asthma --Continue  Protonix  EC tablet 40 mg p.o. daily for GERD --Continue MVI tablets po daily for supplement --Continue Colace 100 mg po BID for constipation  Other PRNS -Continue Tylenol  650 mg every 6 hours PRN for mild pain -Continue Maalox 30 ml Q 4 hrs PRN for indigestion -Continue MOM 30 ml po Q 6 hrs for constipation  Safety and Monitoring: Voluntary admission to inpatient psychiatric unit for safety, stabilization and treatment Daily contact with patient to assess and evaluate symptoms and progress in treatment Patient's case to be discussed in multi-disciplinary team meeting Observation Level : q15 minute checks Vital signs: q12 hours Precautions: Safety  Discharge Planning: Social work and case management to assist with discharge planning and identification of hospital follow-up needs prior to discharge Estimated LOS: 5-7 days Discharge Concerns: Need to establish a safety plan; Medication compliance and effectiveness Discharge Goals: Return home with outpatient referrals for mental health follow-up including medication management/psychotherapy   I certify that inpatient services furnished can reasonably be expected to improve the patient's condition.    Mac Bolster, NP, pmhnp, fnp-bc. 10/19/2023, 9:49 AM Patient ID: Miranda Berger, female   DOB: 1955/03/01, 68 y.o.   MRN: [id number]

## 2023-10-19 NOTE — BHH Group Notes (Addendum)
 Spiritual care group facilitated by Chaplain Rockie Sofia, [facility]   Group focused on topic of strength. Group members reflected on what thoughts and feelings emerge when they hear this topic. They then engaged in facilitated dialog around how strength is present in their lives. This dialog focused on representing what strength had been to them in their lives (images and patterns given) and what they saw as helpful in their life now (what they needed / wanted).  Activity drew on narrative framework.  Patient Progress: Miranda Berger attended group and actively engaged and participated in group conversation and activities.

## 2023-10-19 NOTE — Plan of Care (Signed)
   Problem: Education: Goal: Emotional status will improve Outcome: Not Progressing Goal: Mental status will improve Outcome: Not Progressing Goal: Verbalization of understanding the information provided will improve Outcome: Not Progressing

## 2023-10-20 MED ORDER — SODIUM CHLORIDE 0.9 % IN NEBU
INHALATION_SOLUTION | RESPIRATORY_TRACT | Status: AC
  Administered 2023-10-20: 5 mL
  Filled 2023-10-20: qty 9

## 2023-10-20 MED ORDER — PROPRANOLOL HCL 10 MG PO TABS
10.0000 mg | ORAL_TABLET | Freq: Two times a day (BID) | ORAL | Status: DC
  Administered 2023-10-20 – 2023-10-24 (×8): 10 mg via ORAL
  Filled 2023-10-20 (×8): qty 1

## 2023-10-20 NOTE — Plan of Care (Signed)

## 2023-10-20 NOTE — Group Note (Signed)
 LCSW Group Therapy Note   Group Date: 10/20/2023 Start Time: 1100 End Time: 1200   Participation:  did not attend  Type of Therapy:  Group Therapy  Topic:  Healing From Within: Understanding Our Past, Building Our Future"  Objective:  To help participants understand the impact of early experiences on mental and physical health, with a focus on Adverse Childhood Experiences (ACEs), and to explore ways to build resilience and healing.  Group Goals: Understand ACEs and Their Impact: Learn how childhood experiences shape mental and physical health. Build Resilience: Develop strategies for overcoming challenges and creating positive change. Promote Healing: Recognize the value of support and the possibility of healing through therapy and self-care.  Summary: In today's session, we discussed how early experiences, especially ACEs, impact mental and physical health. We explored the effects of stress, abuse, and neglect on brain development and well-being. The group focused on resilience, understanding that healing and positive change are possible with support and self-awareness.  Therapeutic Modalities Used: Psychoeducation: Sharing information about ACEs and their effects. Cognitive Behavioral Therapy (CBT): Helping reframe negative thought patterns. Trauma-Informed Therapy: Creating a safe, supportive space for healing.   Tilda Samudio O Kimyetta Flott, LCSWA 10/20/2023  6:21 PM

## 2023-10-20 NOTE — Progress Notes (Addendum)
 D. Pt presents with a flat affect, is a little cautious during interactions, but has been calm and cooperative. Pt reported sleeping well last, but complained of vertigo this morning, and stated that her head felt heavy. Pt reported that she has had vertigo before, and had received physical therapy in the past for it. Pt reported that she had attempted to 'scoot' to the bathroom this morning so that she wouldn't fall, therefore patient provided with wheelchair and educated on its use. Pt agreeable and stated that she would contact staff for assistance if needed. . Pt currently denies SI/HI and AVH and doesn't appear to be responding to internal stimuli. Pt did report hearing people talking outside of her room and noises outside her window, and stated that she heard the tv last night talking about issues on the unit.  A. Labs and vitals monitored. Pt given and educated on medications. Pt supported emotionally and encouraged to express concerns and ask questions.   R. Pt remains safe with 15 minute checks. Will continue POC.

## 2023-10-20 NOTE — BHH Group Notes (Signed)
 B[facility] Group Notes:  (Nursing/MHT/Case Management/Adjunct)  Date:  10/20/2023  Time:  2000  Type of Therapy:  Wrap up group  Participation Level:  Active  Participation Quality:  Appropriate, Attentive, and Sharing  Affect:  Lethargic  Cognitive:  Alert  Insight:  Limited  Engagement in Group:  Developing/Improving  Modes of Intervention:  Clarification, Education, and Support  Summary of Progress/Problems: Positive thinking and positive change were discussed.   [name] 10/20/2023, 8:58 PM

## 2023-10-20 NOTE — Progress Notes (Signed)
   10/20/23 2000  Psych Admission Type (Psych Patients Only)  Admission Status Involuntary  Psychosocial Assessment  Patient Complaints Confusion;Anxiety  Eye Contact Fair  Facial Expression Flat  Affect Preoccupied  Speech Soft  Interaction Minimal  Motor Activity Slow  Appearance/Hygiene Unremarkable  Behavior Characteristics Cooperative  Mood Anxious  Aggressive Behavior  Effect No apparent injury  Thought Process  Coherency Circumstantial  Content Preoccupation  Delusions None reported or observed  Perception WDL  Hallucination None reported or observed  Judgment Poor  Confusion Mild  Danger to Self  Current suicidal ideation? Denies

## 2023-10-20 NOTE — Plan of Care (Signed)
  Problem: Education: Goal: Knowledge of [facility] Education information/materials will improve Outcome: Progressing Goal: Verbalization of understanding the information provided will improve Outcome: Progressing   Problem: Activity: Goal: Sleeping patterns will improve Outcome: Progressing   Problem: Coping: Goal: Ability to verbalize frustrations and anger appropriately will improve Outcome: Progressing

## 2023-10-20 NOTE — Group Note (Signed)
 Date:  10/20/2023 Time:  4:25 PM  Group Topic/Focus:  Medication Side effect    Participation Level:  Did Not Attend  Participation Quality:    Affect:  Miranda Berger Lowers 10/20/2023, 4:25 PM

## 2023-10-20 NOTE — Progress Notes (Signed)
 Pt greeted in her room during environmental checks. Pt was half dressed and asked for help putting her gown on. Pt had tried tying the bed sheet around her body mistaking it for a clean gown. Pt stated oh I was wondering where my other sheet went. Pt was provided two new gowns and was offered assistance to put on but pt assured technician she could do it. Pt later seen lying in bed with gowns on correctly.

## 2023-10-20 NOTE — [facility] Suicide Risk Assessment (Signed)
 [facility] INPATIENT:  Family/Significant Other Suicide Prevention Education  Suicide Prevention Education:  Education Completed; Miranda Berger (husband) [phone],  (name of family member/significant other) has been identified by the patient as the family member/significant other with whom the patient will be residing, and identified as the person(s) who will aid the patient in the event of a mental health crisis (suicidal ideations/suicide attempt).  With written consent from the patient, the family member/significant other has been provided the following suicide prevention education, prior to the and/or following the discharge of the patient.  The suicide prevention education provided includes the following: Suicide risk factors Suicide prevention and interventions National Suicide Hotline telephone number [facility] assessment telephone number [facility] Emergency Assistance 911 [facility] and/or Residential Mobile Crisis Unit telephone number  Request made of family/significant other to: Remove weapons (e.g., guns, rifles, knives), all items previously/currently identified as safety concern.   Remove drugs/medications (over-the-counter, prescriptions, illicit drugs), all items previously/currently identified as a safety concern.  Alm has a hunting shotgun that he agreed will be locked up safely away from patient access. Alm agrees to help keep track of patient's medications upon discharge and safely store other OTC/prescription medications. Alm is able to provide transportation at the time of discharge and patient is allowed to return home.  The family member/significant other verbalizes understanding of the suicide prevention education information provided.  The family member/significant other agrees to remove the items of safety concern listed above.  Miranda Berger 10/20/2023, 11:02 AM

## 2023-10-20 NOTE — Plan of Care (Signed)
  Problem: Coping: Goal: Ability to verbalize frustrations and anger appropriately will improve Outcome: Progressing Goal: Ability to demonstrate self-control will improve Outcome: Progressing   Problem: Health Behavior/Discharge Planning: Goal: Identification of resources available to assist in meeting health care needs will improve Outcome: Progressing   

## 2023-10-20 NOTE — Progress Notes (Addendum)
(  Sleep Hours) - 9.25  (Any PRNs that were needed, meds refused, or side effects to meds)-  None  (Any disturbances and when (visitation, over night)-  (Concerns raised by the patient)- Unsteady gait and confusion need assistance to walk to the bathroom.   (SI/HI/AVH)- A/VH

## 2023-10-20 NOTE — Progress Notes (Signed)
 [facility] MD Progress Note Vaping 10/20/2023 4:27 PM Miranda Berger  MRN:  [id number]  Principal Problem: MDD (major depressive disorder), recurrent, severe, with psychosis (HCC)  Diagnosis: Principal Problem:   MDD (major depressive disorder), recurrent, severe, with psychosis (HCC)  Reason for admission: This is the first inpatient psychiatric admission for Miranda Berger, a 68 year old Caucasian female with prior psychiatric diagnoses significant for MDD recurrent, severe with psychotic features, OSA, GAD, and prior medical diagnoses significant for degenerative tear of medial meniscus of right knee, aortic stenosis, 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, asthma, LBBB, GERD, and family history of adverse reaction to anesthesia.  Patient presents involuntarily to [facility] for worsening depression and bizarre behaviors.   Daily notes: Patient is seen in her room this morning. Chart reviewed. The chart findings discussed with the treatment team. She presents alert, oriented & aware of situation. She is sitting up on a wheelchair she was provided with this morning after she complained of feeling dizzy in the early part of this morning. She propels the wheelchair per herself with minimal direction on how to propel the right way. Miranda Berger is making a good eye contact & verbally responsive. She reports, I'm doing better now. But when I woke up this morning, it felt heavy inside my head, then I felt dizzy. The nurse brought me this wheelchair. I had this in the past & I did receive some therapy for it. As for my mood, I think I'm doing better this morning. I slept well last night & I'm trying get some more sleep in the next few nights. I feel like I'm coming along okay. Miranda Berger presents better today than she did yesterday. She reports an improving mood. She denies any side effects to her medications. She currently denies any SIHI, AVH, delusional thoughts or paranoia. She does not  appear to be responding to any internal stimuli. Reviewed vital signs, blood pressure readings is normal today, however her pulse rate is elevated at 117. She is started on Propranolol  10 mg po bid. She remains on the low dose of lisinopril  5 mg po daily. Staff continue to encouraged group attendance & participation. See the treatment plan below. Continue as already in progress.   Total Time spent with patient: 45 minutes  Past Psychiatric History: Previous Psych Diagnoses: MDD with psychotic features, OSA, & GAD, Prior inpatient treatment: Denies Current/prior outpatient treatment: Yes, Michelle Gallomore at [facility] counseling Prior rehab hx: Denies Psychotherapy hx: Yes History of suicide: Denies History of homicide or aggression: Denies Psychiatric medication history: Reports trial medication of Remeron , Xanax , and Wellbutrin  Psychiatric medication compliance history: Noncompliant Neuromodulation history: Denies Current Psychiatrist: Darice Molt, NP Current therapist: Rosaline Battle  Past Medical History:  Past Medical History:  Diagnosis Date   Abdominal pain 06/22/2012   Anemia    in the past    Anxiety    Aortic stenosis 06/20/2017   Asthma    Chronic interstitial cystitis 06/22/2012   Costochondral chest pain 04/04/2018   Degenerative tear of medial meniscus of right knee 04/21/2016   Depression    Difficult or painful urination 06/22/2012   Dyspareunia 06/22/2012   Family history of adverse reaction to anesthesia    mom had nausea   Female pelvic pain 06/22/2012   GERD (gastroesophageal reflux disease)    Hepatitis    Hepatitis A in 3rd grade?   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 07/26/2017   [employer], size 23   Urinary urgency 06/22/2012    Past Surgical History:  Procedure Laterality Date   ABDOMINAL HYSTERECTOMY     AORTIC VALVE REPLACEMENT N/A 07/26/2017   Procedure: MINIMALLY INVASIVE AORTIC VALVE REPLACEMENT (AVR);  Surgeon: Dusty Sudie DEL, MD;  Location: [facility] OR;  Service: Open Heart Surgery;  Laterality: N/A;   BLADDER SURGERY  2011   bone spur removal Right 08/2022   great toe   CARDIAC CATHETERIZATION     CARPAL TUNNEL RELEASE  2012   CHOLECYSTECTOMY     RIGHT/LEFT HEART CATH AND CORONARY ANGIOGRAPHY N/A 07/08/2017   Procedure: RIGHT/LEFT HEART CATH AND CORONARY ANGIOGRAPHY;  Surgeon: Wonda Sharper, MD;  Location: [facility] INVASIVE CV LAB;  Service: Cardiovascular;  Laterality: N/A;   TEE WITHOUT CARDIOVERSION N/A 07/26/2017   Procedure: TRANSESOPHAGEAL ECHOCARDIOGRAM (TEE);  Surgeon: Dusty Sudie DEL, MD;  Location: [facility] OR;  Service: Open Heart Surgery;  Laterality: N/A;   TONSILLECTOMY     Family History:  Family History  Problem Relation Age of Onset   Stroke Mother    Leukemia Mother    Stroke Father    Sleep apnea Father    Valvular heart disease Brother    Sleep apnea Brother    Heart Problems Maternal Aunt    Valvular heart disease Maternal Aunt    Valvular heart disease Paternal Grandfather    Family Psychiatric  History: See H&P Social History:  Social History   Substance and Sexual Activity  Alcohol Use No   Alcohol/week: 0.0 standard drinks of alcohol     Social History   Substance and Sexual Activity  Drug Use No    Social History   Socioeconomic History   Marital status: Married    Spouse name: Not on file   Number of children: 1   Years of education: BS   Highest education level: Not on file  Occupational History   Not on file   Tobacco Use   Smoking status: Never   Smokeless tobacco: Never  Vaping Use   Vaping status: Never Used  Substance and Sexual Activity   Alcohol use: No    Alcohol/week: 0.0 standard drinks of alcohol   Drug use: No   Sexual activity: Not on file  Other Topics Concern   Not on file  Social History Narrative   Occasionally consumes tea or coffee   Social Drivers of Corporate investment banker Strain: Not on file  Food Insecurity: No Food Insecurity (10/16/2023)   Hunger Vital Sign    Worried About Running Out of Food in the Last Year: Never true    Ran Out of Food in the Last Year: Never true  Transportation Needs: No Transportation Needs (10/16/2023)   PRAPARE - Administrator, Civil Service (Medical): No    Lack of Transportation (Non-Medical): No  Physical Activity: Inactive (10/06/2017)   Exercise Vital Sign    Days of Exercise per Week: 0 days    Minutes of Exercise per Session: 0 min  Stress: Stress Concern Present (10/06/2017)   [employer] of Occupational Health - Occupational Stress Questionnaire  Feeling of Stress : Rather much  Social Connections: Patient Unable To Answer (10/17/2023)   Social Connection and Isolation Panel    Frequency of Communication with Friends and Family: Patient unable to answer    Frequency of Social Gatherings with Friends and Family: Patient unable to answer    Attends Religious Services: Patient unable to answer    Active Member of Clubs or Organizations: Patient unable to answer    Attends Banker Meetings: Patient unable to answer    Marital Status: Patient unable to answer   Additional Social History:   Sleep: Good Estimated Sleeping Duration (Last 24 Hours): 8.00-9.00 hours  Appetite:  Good  Current Medications: Current Facility-Administered Medications  Medication Dose Route Frequency Provider Last Rate Last Admin   acetaminophen  (TYLENOL ) tablet 650 mg  650 mg Oral Q6H PRN Tex Drilling, NP    650 mg at 10/18/23 2126   albuterol  (VENTOLIN  HFA) 108 (90 Base) MCG/ACT inhaler 2 puff  2 puff Inhalation Q6H PRN Tex Drilling, NP       alum & mag hydroxide-simeth (MAALOX/MYLANTA) 200-200-20 MG/5ML suspension 30 mL  30 mL Oral Q4H PRN Tex Drilling, NP       aspirin  EC tablet 81 mg  81 mg Oral Daily Nkwenti, Doris, NP   81 mg at 10/20/23 9187   docusate sodium  (COLACE) capsule 100 mg  100 mg Oral BID Parker, Alvin S, MD   100 mg at 10/20/23 9187   hydrOXYzine  (ATARAX ) tablet 25 mg  25 mg Oral TID Ntuen, Tina C, FNP   25 mg at 10/20/23 1144   lisinopril  (ZESTRIL ) tablet 5 mg  5 mg Oral Daily Emmry Hinsch I, NP   5 mg at 10/20/23 9187   LORazepam  (ATIVAN ) tablet 1 mg  1 mg Oral Q6H PRN Parker, Alvin S, MD       magnesium  hydroxide (MILK OF MAGNESIA) suspension 30 mL  30 mL Oral Daily PRN Tex Drilling, NP       mirtazapine  (REMERON ) tablet 7.5 mg  7.5 mg Oral QHS Nkwenti, Doris, NP   7.5 mg at 10/19/23 2133   montelukast  (SINGULAIR ) tablet 10 mg  10 mg Oral Daily Nkwenti, Doris, NP   10 mg at 10/20/23 9187   multivitamin with minerals tablet 1 tablet  1 tablet Oral Daily Tex Drilling, NP   1 tablet at 10/20/23 9187   OLANZapine  (ZYPREXA ) injection 5 mg  5 mg Intramuscular TID PRN Tex Drilling, NP       OLANZapine  zydis (ZYPREXA ) disintegrating tablet 5 mg  5 mg Oral TID PRN Tex Drilling, NP       pantoprazole  (PROTONIX ) EC tablet 40 mg  40 mg Oral Daily Nkwenti, Doris, NP   40 mg at 10/20/23 9187   propranolol  (INDERAL ) tablet 10 mg  10 mg Oral BID Ronalda Walpole I, NP       risperiDONE  (RISPERDAL  M-TABS) disintegrating tablet 3 mg  3 mg Oral BID Parker, Alvin S, MD   3 mg at 10/20/23 9187   thiamine  (Vitamin B-1) tablet 100 mg  100 mg Oral Daily Nkwenti, Doris, NP   100 mg at 10/20/23 9187   traZODone  (DESYREL ) tablet 50 mg  50 mg Oral QHS PRN Ntuen, Tina C, FNP   50 mg at 10/18/23 2126    Lab Results:  No results found for this or any previous visit (from the past 48 hours).  Blood  Alcohol level:  Lab Results  Component Value Date   ETH <  15 10/16/2023   ETH <15 10/13/2023   Metabolic Disorder Labs: Lab Results  Component Value Date   HGBA1C 5.6 10/16/2023   MPG 114.02 10/16/2023   MPG 111.15 07/22/2017   No results found for: PROLACTIN Lab Results  Component Value Date   CHOL 209 (H) 10/16/2023   TRIG 86 10/16/2023   HDL 83 10/16/2023   CHOLHDL 2.5 10/16/2023   VLDL 17 10/16/2023   LDLCALC 109 (H) 10/16/2023   Physical Findings: AIMS:  ,  ,  ,  ,  ,  ,   CIWA:  CIWA-Ar Total: 1 COWS:     Musculoskeletal: Strength & Muscle Tone: within normal limits Gait & Station: normal Patient leans: N/A  Psychiatric Specialty Exam:  Presentation  General Appearance:  Casual (Sitting up on a wheel chair.)  Eye Contact: Good  Speech: Clear and Coherent; Normal Rate  Speech Volume: Normal  Handedness: Right  Mood and Affect  Mood: -- (Showing some improvement today.)  Affect: Congruent  Thought Process  Thought Processes: Coherent  Descriptions of Associations:Intact  Orientation:Full (Time, Place and Person)  Thought Content:Logical  History of Schizophrenia/Schizoaffective disorder:No  Duration of Psychotic Symptoms:N/A  Hallucinations:Hallucinations: None Description of Auditory Hallucinations: NA Description of Visual Hallucinations: NA   Ideas of Reference:None  Suicidal Thoughts:Suicidal Thoughts: No   Homicidal Thoughts:Homicidal Thoughts: No   Sensorium  Memory: Immediate Good; Recent Fair; Remote Fair  Judgment: Fair  Insight: Fair  Art therapist  Concentration: Good  Attention Span: Good  Recall: Fair  Fund of Knowledge: Fair  Language: Good  Psychomotor Activity  Psychomotor Activity: Psychomotor Activity: Decreased   Assets  Assets: Communication Skills; Desire for Improvement; Financial Resources/Insurance; Housing; Resilience; Social Support  Sleep  Sleep: Sleep:  Good Number of Hours of Sleep: 7.5   Physical Exam: Physical Exam Vitals and nursing note reviewed.  Constitutional:      Appearance: She is normal weight.  HENT:     Head: Normocephalic.     Right Ear: External ear normal.     Left Ear: External ear normal.     Nose: Nose normal.     Mouth/Throat:     Mouth: Mucous membranes are moist.     Pharynx: Oropharynx is clear.  Eyes:     Extraocular Movements: Extraocular movements intact.  Cardiovascular:     Rate and Rhythm: Normal rate.     Pulses: Normal pulses.  Pulmonary:     Effort: Pulmonary effort is normal. No respiratory distress.  Abdominal:     Comments: Deferred yes  Genitourinary:    Comments: Deferred Musculoskeletal:        General: Normal range of motion.     Cervical back: Normal range of motion.  Skin:    General: Skin is warm.  Neurological:     General: No focal deficit present.     Mental Status: She is alert and oriented to person, place, and time.  Psychiatric:        Mood and Affect: Mood normal.        Behavior: Behavior normal.    Review of Systems  Constitutional:  Negative for chills and fever.  HENT:  Negative for sore throat.   Eyes:  Negative for blurred vision.  Respiratory:  Negative for cough, sputum production, shortness of breath and wheezing.   Cardiovascular:  Negative for chest pain and palpitations.  Gastrointestinal:  Positive for nausea (As needed of Zofran  given for nausea). Negative for heartburn.  Genitourinary:  Negative for dysuria,  frequency and urgency.  Musculoskeletal:  Negative for falls.  Skin:  Negative for itching and rash.  Neurological:  Negative for dizziness and headaches.  Endo/Heme/Allergies:        See allergy listing  Psychiatric/Behavioral:  Positive for depression, hallucinations and suicidal ideas. Negative for substance abuse. The patient is nervous/anxious. The patient does not have insomnia.    Blood pressure 105/70, pulse (!) 117, temperature 98.2  F (36.8 C), temperature source Oral, resp. rate 17, height 5' 4 (1.626 m), weight 72 kg, SpO2 97%. Body mass index is 27.26 kg/m.  Treatment Plan Summary: Daily contact with patient to assess and evaluate symptoms and progress in treatment and Medication management  Continue inpatient hospitalization.  Will continue today 10/20/2023 plan as below except where it is noted.   Principal/active diagnoses.  MDD (major depressive disorder), recurrent, severe, with psychosis (HCC)  Plan: The risks/benefits/side-effects/alternatives to the medications in use were discussed in detail with the patient and time was given for patient's questions. The patient consents to medication trial.   Continue Mirtazapine  7.5 mg po Q hs for insomnia. Continue Risperdal  M-tabs 3 mg po bid for mood control.  Continue Hydroxyzine  25 mg po tid prn for anxiety.  Continue Trazodone  50 mg po Q bedtime prn for insomnia.  Continue the prn CIWA as recommended for alcohol/benzodiazepine withdrawal management.  Agitation protocols.  Continue as recommended (see MAR).  Other medical. --Continue albuterol  inhaler 2 puffs Q 6 hrs prn for SOB. --Continue aspirin  81 mg po Q daily for heart health. --Continue montelukast  tablet 10 mg p.o. daily for asthma --Continue Protonix  EC tablet 40 mg p.o. daily for GERD --Continue MVI tablets po daily for supplement --Continue Colace 100 mg po BID for constipation.  --Continue Lisinopril  5 mg po every day for elevated blood pressure.  --Started on Propranolol  10 mg po bid for elevated heart rate.  Other PRNS -Continue Tylenol  650 mg every 6 hours PRN for mild pain -Continue Maalox 30 ml Q 4 hrs PRN for indigestion -Continue MOM 30 ml po Q 6 hrs for constipation  Safety and Monitoring: Voluntary admission to inpatient psychiatric unit for safety, stabilization and treatment Daily contact with patient to assess and evaluate symptoms and progress in treatment Patient's case to  be discussed in multi-disciplinary team meeting Observation Level : q15 minute checks Vital signs: q12 hours Precautions: Safety  Discharge Planning: Social work and case management to assist with discharge planning and identification of hospital follow-up needs prior to discharge Estimated LOS: 5-7 days Discharge Concerns: Need to establish a safety plan; Medication compliance and effectiveness Discharge Goals: Return home with outpatient referrals for mental health follow-up including medication management/psychotherapy   I certify that inpatient services furnished can reasonably be expected to improve the patient's condition.    Mac Bolster, NP, pmhnp, fnp-bc. 10/20/2023, 4:27 PM Patient ID: Miranda Berger, female   DOB: 26-Apr-1955, 68 y.o.   MRN: [id number] Patient ID: Miranda Berger, female   DOB: 06-30-1955, 68 y.o.   MRN: [id number]

## 2023-10-20 NOTE — Progress Notes (Signed)
   10/20/23 0900  Psych Admission Type (Psych Patients Only)  Admission Status Involuntary  Psychosocial Assessment  Patient Complaints Anxiety;Confusion  Eye Contact Fair  Facial Expression Flat  Affect Preoccupied  Speech Soft  Interaction Minimal;Cautious  Motor Activity Slow  Appearance/Hygiene Unremarkable  Behavior Characteristics Cooperative  Mood Anxious;Preoccupied  Thought Process  Coherency Circumstantial  Content Preoccupation  Delusions None reported or observed  Perception Hallucinations  Hallucination Auditory  Judgment Poor  Confusion Moderate  Danger to Self  Current suicidal ideation? Denies  Agreement Not to Harm Self Yes  Description of Agreement agreed to contact staff before acting on harmful thoughts  Danger to Others  Danger to Others None reported or observed

## 2023-10-21 ENCOUNTER — Encounter (HOSPITAL_COMMUNITY): Payer: Self-pay

## 2023-10-21 NOTE — Care Management Important Message (Signed)
 Medicare IM printed and given to social work to give to the patient. ?

## 2023-10-21 NOTE — Progress Notes (Signed)
(  Sleep Hours) -7.25 (Any PRNs that were needed, meds refused, or side effects to meds)- Trazodone  (Any disturbances and when (visitation, over night)-none (Concerns raised by the patient)- pt has complaint of dizziness  (SI/HI/AVH)-denied

## 2023-10-21 NOTE — Progress Notes (Signed)
   10/21/23 2021  Psych Admission Type (Psych Patients Only)  Admission Status Involuntary  Psychosocial Assessment  Patient Complaints Anxiety  Eye Contact Fair  Facial Expression Flat  Affect Preoccupied  Speech Soft  Interaction Minimal  Motor Activity Slow  Appearance/Hygiene Unremarkable  Behavior Characteristics Cooperative  Mood Anxious  Aggressive Behavior  Effect No apparent injury  Thought Process  Coherency Circumstantial  Content Preoccupation  Delusions None reported or observed  Perception WDL  Hallucination None reported or observed  Judgment Poor  Confusion Mild  Danger to Self  Current suicidal ideation? Denies

## 2023-10-21 NOTE — Group Note (Signed)
 Date:  10/21/2023 Time:  8:32 PM  Group Topic/Focus:  AA/NA Group    Participation Level:  Did Not Attend  Participation Quality:  Resistant  Affect:  Resistant  Cognitive:  Lacking  Insight: Lacking  Engagement in Group:  None  Modes of Intervention:  Discussion  Additional Comments:  Patient did not attend group  Miranda Berger 10/21/2023, 8:32 PM

## 2023-10-21 NOTE — Group Note (Signed)
 Recreation Therapy Group Note   Group Topic:Problem Solving  Group Date: 10/21/2023 Start Time: 0935 End Time: 1003 Facilitators: Harly Pipkins-McCall, LRT,CTRS Location: [address] Dayroom   Group Topic: Communication, Team Building, Problem Solving  Goal Area(s) Addresses:  Patient will effectively work with peer towards shared goal.  Patient will identify skills used to make activity successful.  Patient will identify how skills used during activity can be used to reach post d/c goals.   Behavioral Response:   Intervention: STEM Activity  Activity: Straw Bridge. In teams of 3-5, patients were given 15 plastic drinking straws and an equal length of masking tape. Using the materials provided, patients were instructed to build a free standing bridge-like structure to suspend an everyday item (ex: puzzle box) off of the floor or table surface. All materials were required to be used by the team in their design. LRT facilitated post-activity discussion reviewing team process. Patients were encouraged to reflect how the skills used in this activity can be generalized to daily life post discharge.   Education: Pharmacist, community, Scientist, physiological, Discharge Planning   Education Outcome: Acknowledges education/In group clarification offered/Needs additional education.    Affect/Mood: N/A   Participation Level: Did not attend    Clinical Observations/Individualized Feedback:      Plan: Continue to engage patient in RT group sessions 2-3x/week.   Shia Eber-McCall, LRT,CTRS 10/21/2023 11:35 AM

## 2023-10-21 NOTE — BH IP Treatment Plan (Signed)
 Interdisciplinary Treatment and Diagnostic Plan Update  10/21/2023 Time of Session: 12:00 PM - UPDATE Miranda Berger MRN: [id number]  Principal Diagnosis: MDD (major depressive disorder), recurrent, severe, with psychosis (HCC)  Secondary Diagnoses: Principal Problem:   MDD (major depressive disorder), recurrent, severe, with psychosis (HCC)   Current Medications:  Current Facility-Administered Medications  Medication Dose Route Frequency Provider Last Rate Last Admin   acetaminophen  (TYLENOL ) tablet 650 mg  650 mg Oral Q6H PRN Tex Drilling, NP   650 mg at 10/18/23 2126   albuterol  (VENTOLIN  HFA) 108 (90 Base) MCG/ACT inhaler 2 puff  2 puff Inhalation Q6H PRN Tex Drilling, NP       alum & mag hydroxide-simeth (MAALOX/MYLANTA) 200-200-20 MG/5ML suspension 30 mL  30 mL Oral Q4H PRN Tex Drilling, NP       aspirin  EC tablet 81 mg  81 mg Oral Daily Nkwenti, Doris, NP   81 mg at 10/21/23 0848   docusate sodium  (COLACE) capsule 100 mg  100 mg Oral BID Parker, Alvin S, MD   100 mg at 10/21/23 1724   hydrOXYzine  (ATARAX ) tablet 25 mg  25 mg Oral TID Ntuen, Tina C, FNP   25 mg at 10/21/23 1724   lisinopril  (ZESTRIL ) tablet 5 mg  5 mg Oral Daily Nwoko, Agnes I, NP   5 mg at 10/21/23 9150   LORazepam  (ATIVAN ) tablet 1 mg  1 mg Oral Q6H PRN Parker, Alvin S, MD       magnesium  hydroxide (MILK OF MAGNESIA) suspension 30 mL  30 mL Oral Daily PRN Tex Drilling, NP       mirtazapine  (REMERON ) tablet 7.5 mg  7.5 mg Oral QHS Nkwenti, Doris, NP   7.5 mg at 10/20/23 2112   montelukast  (SINGULAIR ) tablet 10 mg  10 mg Oral Daily Nkwenti, Doris, NP   10 mg at 10/21/23 9150   multivitamin with minerals tablet 1 tablet  1 tablet Oral Daily Tex Drilling, NP   1 tablet at 10/21/23 0848   OLANZapine  (ZYPREXA ) injection 5 mg  5 mg Intramuscular TID PRN Tex Drilling, NP       OLANZapine  zydis (ZYPREXA ) disintegrating tablet 5 mg  5 mg Oral TID PRN Tex Drilling, NP       pantoprazole  (PROTONIX ) EC tablet  40 mg  40 mg Oral Daily Nkwenti, Doris, NP   40 mg at 10/21/23 9150   propranolol  (INDERAL ) tablet 10 mg  10 mg Oral BID Nwoko, Agnes I, NP   10 mg at 10/21/23 1724   risperiDONE  (RISPERDAL  M-TABS) disintegrating tablet 3 mg  3 mg Oral BID Parker, Alvin S, MD   3 mg at 10/21/23 1724   thiamine  (Vitamin B-1) tablet 100 mg  100 mg Oral Daily Nkwenti, Doris, NP   100 mg at 10/21/23 0848   traZODone  (DESYREL ) tablet 50 mg  50 mg Oral QHS PRN Ntuen, Tina C, FNP   50 mg at 10/20/23 2155   PTA Medications: Medications Prior to Admission  Medication Sig Dispense Refill Last Dose/Taking   acetaminophen  (TYLENOL ) 500 MG tablet Take 1,000 mg by mouth every 8 (eight) hours as needed.      albuterol  (PROVENTIL  HFA;VENTOLIN  HFA) 108 (90 Base) MCG/ACT inhaler Inhale 2 puffs into the lungs every 6 (six) hours as needed for wheezing or shortness of breath.      ALPRAZolam  (XANAX ) 0.5 MG tablet Take 0.5 mg by mouth in the morning and at bedtime.      aspirin  EC 81 MG tablet Take  81 mg by mouth daily.      buPROPion  (WELLBUTRIN  XL) 300 MG 24 hr tablet Take 1 tablet (300 mg total) by mouth daily. 30 tablet 0    calcium  carbonate (OS-CAL) 600 MG TABS tablet Take 600 mg by mouth daily.       cyanocobalamin  (VITAMIN B12) 500 MCG tablet Take 500 mcg by mouth daily.      fluticasone  (FLONASE ) 50 MCG/ACT nasal spray Place 1 spray into both nostrils daily as needed for allergies.       Fluticasone -Salmeterol (ADVAIR) 250-50 MCG/DOSE AEPB Inhale 1 puff into the lungs 2 (two) times daily.      levocetirizine (XYZAL) 5 MG tablet Take 5 mg by mouth daily.      mirtazapine  (REMERON ) 15 MG tablet Take 15 mg by mouth at bedtime.      montelukast  (SINGULAIR ) 10 MG tablet Take 10 mg by mouth daily.      Multiple Vitamin (MULTIVITAMIN) capsule Take 1 capsule by mouth daily.      omeprazole (PRILOSEC) 40 MG capsule Take 40 mg by mouth daily.      QUEtiapine  (SEROQUEL ) 25 MG tablet Take 0.5 tablets (12.5 mg total) by mouth at  bedtime. 30 tablet 0    saccharomyces boulardii (FLORASTOR) 250 MG capsule Take 250 mg by mouth daily.       Patient Stressors: Health problems    Patient Strengths: Supportive family/friends   Treatment Modalities: Medication Management, Group therapy, Case management,  1 to 1 session with clinician, Psychoeducation, Recreational therapy.   Physician Treatment Plan for Primary Diagnosis: MDD (major depressive disorder), recurrent, severe, with psychosis (HCC) Long Term Goal(s): Improvement in symptoms so as ready for discharge   Short Term Goals: Ability to identify changes in lifestyle to reduce recurrence of condition will improve Ability to verbalize feelings will improve Ability to disclose and discuss suicidal ideas Ability to demonstrate self-control will improve Ability to identify and develop effective coping behaviors will improve Ability to maintain clinical measurements within normal limits will improve Compliance with prescribed medications will improve Ability to identify triggers associated with substance abuse/mental health issues will improve  Medication Management: Evaluate patient's response, side effects, and tolerance of medication regimen.  Therapeutic Interventions: 1 to 1 sessions, Unit Group sessions and Medication administration.  Evaluation of Outcomes: Progressing  Physician Treatment Plan for Secondary Diagnosis: Principal Problem:   MDD (major depressive disorder), recurrent, severe, with psychosis (HCC)  Long Term Goal(s): Improvement in symptoms so as ready for discharge   Short Term Goals: Ability to identify changes in lifestyle to reduce recurrence of condition will improve Ability to verbalize feelings will improve Ability to disclose and discuss suicidal ideas Ability to demonstrate self-control will improve Ability to identify and develop effective coping behaviors will improve Ability to maintain clinical measurements within normal limits  will improve Compliance with prescribed medications will improve Ability to identify triggers associated with substance abuse/mental health issues will improve     Medication Management: Evaluate patient's response, side effects, and tolerance of medication regimen.  Therapeutic Interventions: 1 to 1 sessions, Unit Group sessions and Medication administration.  Evaluation of Outcomes: Progressing   RN Treatment Plan for Primary Diagnosis: MDD (major depressive disorder), recurrent, severe, with psychosis (HCC) Long Term Goal(s): Knowledge of disease and therapeutic regimen to maintain health will improve  Short Term Goals: Ability to remain free from injury will improve, Ability to verbalize frustration and anger appropriately will improve, Ability to verbalize feelings will improve, and Ability to  disclose and discuss suicidal ideas  Medication Management: RN will administer medications as ordered by provider, will assess and evaluate patient's response and provide education to patient for prescribed medication. RN will report any adverse and/or side effects to prescribing provider.  Therapeutic Interventions: 1 on 1 counseling sessions, Psychoeducation, Medication administration, Evaluate responses to treatment, Monitor vital signs and CBGs as ordered, Perform/monitor CIWA, COWS, AIMS and Fall Risk screenings as ordered, Perform wound care treatments as ordered.  Evaluation of Outcomes: Progressing   LCSW Treatment Plan for Primary Diagnosis: MDD (major depressive disorder), recurrent, severe, with psychosis (HCC) Long Term Goal(s): Safe transition to appropriate next level of care at discharge, Engage patient in therapeutic group addressing interpersonal concerns.  Short Term Goals: Engage patient in aftercare planning with referrals and resources, Increase ability to appropriately verbalize feelings, Facilitate acceptance of mental health diagnosis and concerns, and Identify triggers  associated with mental health/substance abuse issues  Therapeutic Interventions: Assess for all discharge needs, 1 to 1 time with Social worker, Explore available resources and support systems, Assess for adequacy in community support network, Educate family and significant other(s) on suicide prevention, Complete Psychosocial Assessment, Interpersonal group therapy.  Evaluation of Outcomes: Not Progressing   Progress in Treatment: Attending groups:  Yes, attended some groups Participating in groups: Yes. Taking medication as prescribed: Yes. Toleration medication: Yes. Family/Significant other contact made: Yes, contacted:  Miranda Berger, husband, [phone]  Patient understands diagnosis: Yes. Discussing patient identified problems/goals with staff: Yes. Medical problems stabilized or resolved: Yes. Denies suicidal/homicidal ideation: Yes. Issues/concerns per patient self-inventory: No. Patient Goals:  [place]   Discharge Plan or Barriers: Pt likely to discharge home once stable.    Reason for Continuation of Hospitalization: Anxiety Delusions    Estimated Length of Stay: 3 - 4 days  Last 3 [place] Suicide Severity Risk Score: Flowsheet Row [facility] (Current[facility] Most recent reading at 10/16/2023  4:10 PM ED from 10/16/2023 in [facility] Most recent reading at 10/16/2023 12:33 PM ED from 10/13/2023 in [facility] Emergency [facility] at [facility] Most recent reading at 10/13/2023  1:29 PM  C-SSRS RISK CATEGORY No Risk No Risk No Risk    Last PHQ 2/9 Scores:    10/12/2017    5:11 PM  Depression screen PHQ 2/9  Decreased Interest 0  Down, Depressed, Hopeless 0  PHQ - 2 Score 0    Scribe for Treatment Team: Miranda Berger, LCSWA 10/21/2023 6:56 PM

## 2023-10-21 NOTE — Plan of Care (Signed)
   Problem: Education: Goal: Emotional status will improve Outcome: Progressing   Problem: Activity: Goal: Interest or engagement in activities will improve Outcome: Progressing

## 2023-10-21 NOTE — Progress Notes (Signed)
 [facility] MD Progress Note  10/21/2023 3:37 PM Miranda Berger  MRN:  [id number]  Principal Problem: MDD (major depressive disorder), recurrent, severe, with psychosis (HCC)  Diagnosis: Principal Problem:   MDD (major depressive disorder), recurrent, severe, with psychosis (HCC)  Reason for admission: This is the first inpatient psychiatric admission for Miranda Berger, a 68 year old Caucasian female with prior psychiatric diagnoses significant for MDD recurrent, severe with psychotic features, OSA, GAD, and prior medical diagnoses significant for degenerative tear of medial meniscus of right knee, aortic stenosis, 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, asthma, LBBB, GERD, and family history of adverse reaction to anesthesia.  Patient presents involuntarily to [facility] for worsening depression and bizarre behaviors.   Daily notes: Miranda Berger is seen in her room this morning. Chart reviewed. She is lying down in bed. She is alert, oriented & aware of situation. She continues to use wheel chair to aid her ambulation due to complain of dizziness. She reports, I'm pretty much feeling very depressed today. I am worried about whether I'm going to be able to get out of this hospital. I slept well last night. My appetite is very good. I'm starting to worry that the way I'm feeling right now is linked to the side effects of my medicines. I usually will feel this heaviness to my head once I woke up in the morning. I'm thinking this could be side effects of my medicines. I talked to my husband last evening. He says he is coming here to see me. I'm looking forward to seeing him if he will be able to come this evening. I think someone has to bring him here because he is not in a position to drive. Patient currently denies any SIHI, AVH, delusional thoughts or paranoia. She does not appear to be responding to to any internal stimuli. Patient is assured that we are monitoring her closely to  assure that she is doing well. She was asked based on her report from yesterday that when she had felt similar heaviness in her head in the past, whether she was on any kind of medications? Patient replied, no. At this time, there are no changes made on her current plan of care. The staff continues to monitor patient. If this complaint of heavy feeling continues. We will re-evaluate all of her current medications. Patient at this time denies any SIHI, AVH, delusional thoughts or paranoia. She does not appear to be responding to any internal stimuli. Reviewed vital signs, stable. Continue current plan of care as already in progress.   Total Time spent with patient: 45 minutes  Past Psychiatric History: Previous Psych Diagnoses: MDD with psychotic features, OSA, & GAD, Prior inpatient treatment: Denies Current/prior outpatient treatment: Yes, Michelle Gallomore at [facility] counseling Prior rehab hx: Denies Psychotherapy hx: Yes History of suicide: Denies History of homicide or aggression: Denies Psychiatric medication history: Reports trial medication of Remeron , Xanax , and Wellbutrin  Psychiatric medication compliance history: Noncompliant Neuromodulation history: Denies Current Psychiatrist: Darice Molt, NP Current therapist: Rosaline Battle  Past Medical History:  Past Medical History:  Diagnosis Date   Abdominal pain 06/22/2012   Anemia    in the past    Anxiety    Aortic stenosis 06/20/2017   Asthma    Chronic interstitial cystitis 06/22/2012   Costochondral chest pain 04/04/2018   Degenerative tear of medial meniscus of right knee 04/21/2016   Depression    Difficult or painful urination 06/22/2012   Dyspareunia 06/22/2012  Family history of adverse reaction to anesthesia    mom had nausea   Female pelvic pain 06/22/2012   GERD (gastroesophageal reflux disease)    Hepatitis    Hepatitis A in 3rd grade?   High-tone pelvic floor dysfunction 12/26/2013   History of  hypertension 12/01/2021   Hypertension    LBBB (left bundle branch block) 08/08/2017   Major depressive disorder, single episode, severe, with psychosis (HCC) 07/11/2018   MDD (major depressive disorder), recurrent severe, 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   Obstructive sleep apnea    Pericarditis 09/12/2017   PONV (postoperative nausea and vomiting)    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 07/26/2017   [employer], size 23   Urinary urgency 06/22/2012    Past Surgical History:  Procedure Laterality Date   ABDOMINAL HYSTERECTOMY     AORTIC VALVE REPLACEMENT N/A 07/26/2017   Procedure: MINIMALLY INVASIVE AORTIC VALVE REPLACEMENT (AVR);  Surgeon: Dusty Sudie DEL, MD;  Location: [facility] OR;  Service: Open Heart Surgery;  Laterality: N/A;   BLADDER SURGERY  2011   bone spur removal Right 08/2022   great toe   CARDIAC CATHETERIZATION     CARPAL TUNNEL RELEASE  2012   CHOLECYSTECTOMY     RIGHT/LEFT HEART CATH AND CORONARY ANGIOGRAPHY N/A 07/08/2017   Procedure: RIGHT/LEFT HEART CATH AND CORONARY ANGIOGRAPHY;  Surgeon: Wonda Sharper, MD;  Location: [facility] INVASIVE CV LAB;  Service: Cardiovascular;  Laterality: N/A;   TEE WITHOUT CARDIOVERSION N/A 07/26/2017   Procedure: TRANSESOPHAGEAL ECHOCARDIOGRAM (TEE);  Surgeon: Dusty Sudie DEL, MD;  Location: [facility] OR;  Service: Open Heart Surgery;  Laterality: N/A;   TONSILLECTOMY     Family History:  Family History  Problem Relation Age of Onset   Stroke Mother    Leukemia Mother    Stroke Father    Sleep apnea Father    Valvular heart disease Brother    Sleep apnea Brother    Heart Problems Maternal Aunt    Valvular heart disease Maternal Aunt    Valvular heart disease Paternal Grandfather    Family Psychiatric  History: See H&P Social History:  Social History   Substance and Sexual Activity  Alcohol Use No   Alcohol/week: 0.0 standard drinks of alcohol     Social History   Substance and  Sexual Activity  Drug Use No    Social History   Socioeconomic History   Marital status: Married    Spouse name: Not on file   Number of children: 1   Years of education: BS   Highest education level: Not on file  Occupational History   Not on file  Tobacco Use   Smoking status: Never   Smokeless tobacco: Never  Vaping Use   Vaping status: Never Used  Substance and Sexual Activity   Alcohol use: No    Alcohol/week: 0.0 standard drinks of alcohol   Drug use: No   Sexual activity: Not on file  Other Topics Concern   Not on file  Social History Narrative   Occasionally consumes tea or coffee   Social Drivers of Corporate investment banker Strain: Not on file  Food Insecurity: No Food Insecurity (10/16/2023)   Hunger Vital Sign    Worried About Running Out of Food in the Last Year: Never true    Ran Out of Food in the Last Year: Never true  Transportation Needs: No Transportation Needs (10/16/2023)   PRAPARE - Administrator, Civil Service (Medical):  No    Lack of Transportation (Non-Medical): No  Physical Activity: Inactive (10/06/2017)   Exercise Vital Sign    Days of Exercise per Week: 0 days    Minutes of Exercise per Session: 0 min  Stress: Stress Concern Present (10/06/2017)   [employer] of Occupational Health - Occupational Stress Questionnaire    Feeling of Stress : Rather much  Social Connections: Patient Unable To Answer (10/17/2023)   Social Connection and Isolation Panel    Frequency of Communication with Friends and Family: Patient unable to answer    Frequency of Social Gatherings with Friends and Family: Patient unable to answer    Attends Religious Services: Patient unable to answer    Active Member of Clubs or Organizations: Patient unable to answer    Attends Banker Meetings: Patient unable to answer    Marital Status: Patient unable to answer   Additional Social History:   Sleep: Good Estimated Sleeping Duration (Last  24 Hours): 7.75 hours  Appetite:  Good  Current Medications: Current Facility-Administered Medications  Medication Dose Route Frequency Provider Last Rate Last Admin   acetaminophen  (TYLENOL ) tablet 650 mg  650 mg Oral Q6H PRN Tex Drilling, NP   650 mg at 10/18/23 2126   albuterol  (VENTOLIN  HFA) 108 (90 Base) MCG/ACT inhaler 2 puff  2 puff Inhalation Q6H PRN Tex Drilling, NP       alum & mag hydroxide-simeth (MAALOX/MYLANTA) 200-200-20 MG/5ML suspension 30 mL  30 mL Oral Q4H PRN Tex Drilling, NP       aspirin  EC tablet 81 mg  81 mg Oral Daily Nkwenti, Doris, NP   81 mg at 10/21/23 0848   docusate sodium  (COLACE) capsule 100 mg  100 mg Oral BID Parker, Alvin S, MD   100 mg at 10/21/23 0848   hydrOXYzine  (ATARAX ) tablet 25 mg  25 mg Oral TID Ntuen, Tina C, FNP   25 mg at 10/21/23 1251   lisinopril  (ZESTRIL ) tablet 5 mg  5 mg Oral Daily Brittin Janik I, NP   5 mg at 10/21/23 0849   LORazepam  (ATIVAN ) tablet 1 mg  1 mg Oral Q6H PRN Parker, Alvin S, MD       magnesium  hydroxide (MILK OF MAGNESIA) suspension 30 mL  30 mL Oral Daily PRN Tex Drilling, NP       mirtazapine  (REMERON ) tablet 7.5 mg  7.5 mg Oral QHS Nkwenti, Doris, NP   7.5 mg at 10/20/23 2112   montelukast  (SINGULAIR ) tablet 10 mg  10 mg Oral Daily Nkwenti, Doris, NP   10 mg at 10/21/23 9150   multivitamin with minerals tablet 1 tablet  1 tablet Oral Daily Tex Drilling, NP   1 tablet at 10/21/23 0848   OLANZapine  (ZYPREXA ) injection 5 mg  5 mg Intramuscular TID PRN Tex Drilling, NP       OLANZapine  zydis (ZYPREXA ) disintegrating tablet 5 mg  5 mg Oral TID PRN Tex Drilling, NP       pantoprazole  (PROTONIX ) EC tablet 40 mg  40 mg Oral Daily Nkwenti, Doris, NP   40 mg at 10/21/23 9150   propranolol  (INDERAL ) tablet 10 mg  10 mg Oral BID Osha Rane I, NP   10 mg at 10/21/23 0849   risperiDONE  (RISPERDAL  M-TABS) disintegrating tablet 3 mg  3 mg Oral BID Parker, Alvin S, MD   3 mg at 10/21/23 0848   thiamine  (Vitamin B-1)  tablet 100 mg  100 mg Oral Daily Tex Drilling, NP   100  mg at 10/21/23 0848   traZODone  (DESYREL ) tablet 50 mg  50 mg Oral QHS PRN Ntuen, Tina C, FNP   50 mg at 10/20/23 2155    Lab Results:  No results found for this or any previous visit (from the past 48 hours).  Blood Alcohol level:  Lab Results  Component Value Date   [facility] <15 10/16/2023   ETH <15 10/13/2023   Metabolic Disorder Labs: Lab Results  Component Value Date   HGBA1C 5.6 10/16/2023   MPG 114.02 10/16/2023   MPG 111.15 07/22/2017   No results found for: PROLACTIN Lab Results  Component Value Date   CHOL 209 (H) 10/16/2023   TRIG 86 10/16/2023   HDL 83 10/16/2023   CHOLHDL 2.5 10/16/2023   VLDL 17 10/16/2023   LDLCALC 109 (H) 10/16/2023   Physical Findings: AIMS:  ,  ,  ,  ,  ,  ,   CIWA:  CIWA-Ar Total: 4 COWS:     Musculoskeletal: Strength & Muscle Tone: within normal limits Gait & Station: normal Patient leans: N/A  Psychiatric Specialty Exam:  Presentation  General Appearance:  Casual (Sitting up on a wheel chair.)  Eye Contact: Good  Speech: Clear and Coherent; Normal Rate  Speech Volume: Normal  Handedness: Right  Mood and Affect  Mood: -- (Showing some improvement today.)  Affect: Congruent  Thought Process  Thought Processes: Coherent  Descriptions of Associations:Intact  Orientation:Full (Time, Place and Person)  Thought Content:Logical  History of Schizophrenia/Schizoaffective disorder:No  Duration of Psychotic Symptoms:N/A  Hallucinations:Hallucinations: None Description of Auditory Hallucinations: NA Description of Visual Hallucinations: NA  Ideas of Reference:None  Suicidal Thoughts:Suicidal Thoughts: No  Homicidal Thoughts:Homicidal Thoughts: No  Sensorium  Memory: Immediate Good; Recent Fair; Remote Fair  Judgment: Fair  Insight: Fair  Art therapist  Concentration: Good  Attention Span: Good  Recall: Fair  Fund of  Knowledge: Fair  Language: Good  Psychomotor Activity  Psychomotor Activity: Psychomotor Activity: Decreased  Assets  Assets: Communication Skills; Desire for Improvement; Financial Resources/Insurance; Housing; Resilience; Social Support  Sleep  Sleep: Sleep: Good Number of Hours of Sleep: 7.5  Physical Exam: Physical Exam Vitals and nursing note reviewed.  Constitutional:      Appearance: She is normal weight.  HENT:     Head: Normocephalic.     Right Ear: External ear normal.     Left Ear: External ear normal.     Nose: Nose normal.     Mouth/Throat:     Mouth: Mucous membranes are moist.     Pharynx: Oropharynx is clear.  Eyes:     Extraocular Movements: Extraocular movements intact.  Cardiovascular:     Rate and Rhythm: Normal rate.     Pulses: Normal pulses.  Pulmonary:     Effort: Pulmonary effort is normal. No respiratory distress.  Abdominal:     Comments: Deferred yes  Genitourinary:    Comments: Deferred Musculoskeletal:        General: Normal range of motion.     Cervical back: Normal range of motion.  Skin:    General: Skin is warm.  Neurological:     General: No focal deficit present.     Mental Status: She is alert and oriented to person, place, and time.  Psychiatric:        Mood and Affect: Mood normal.        Behavior: Behavior normal.    Review of Systems  Constitutional:  Negative for chills and fever.  HENT:  Negative for sore throat.   Eyes:  Negative for blurred vision.  Respiratory:  Negative for cough, sputum production, shortness of breath and wheezing.   Cardiovascular:  Negative for chest pain and palpitations.  Gastrointestinal:  Positive for nausea (As needed of Zofran  given for nausea). Negative for heartburn.  Genitourinary:  Negative for dysuria, frequency and urgency.  Musculoskeletal:  Negative for falls.  Skin:  Negative for itching and rash.  Neurological:  Negative for dizziness and headaches.   Endo/Heme/Allergies:        See allergy listing  Psychiatric/Behavioral:  Positive for depression, hallucinations and suicidal ideas. Negative for substance abuse. The patient is nervous/anxious. The patient does not have insomnia.    Blood pressure 102/73, pulse 100, temperature 98.2 F (36.8 C), temperature source Oral, resp. rate 16, height 5' 4 (1.626 m), weight 72 kg, SpO2 97%. Body mass index is 27.26 kg/m.  Treatment Plan Summary: Daily contact with patient to assess and evaluate symptoms and progress in treatment and Medication management  Continue inpatient hospitalization.  Will continue today 10/21/2023 plan as below except where it is noted.   Principal/active diagnoses.  MDD (major depressive disorder), recurrent, severe, with psychosis (HCC)  Plan: The risks/benefits/side-effects/alternatives to the medications in use were discussed in detail with the patient and time was given for patient's questions. The patient consents to medication trial.   Continue Mirtazapine  7.5 mg po Q hs for insomnia. Continue Risperdal  M-tabs 3 mg po bid for mood control.  Continue Hydroxyzine  25 mg po tid prn for anxiety.  Continue Trazodone  50 mg po Q bedtime prn for insomnia.  Continue the prn CIWA as recommended for alcohol/benzodiazepine withdrawal management.  Agitation protocols.  Continue as recommended (see MAR).  Other medical. --Continue albuterol  inhaler 2 puffs Q 6 hrs prn for SOB. --Continue aspirin  81 mg po Q daily for heart health. --Continue montelukast  tablet 10 mg p.o. daily for asthma --Continue Protonix  EC tablet 40 mg p.o. daily for GERD --Continue MVI tablets po daily for supplement --Continue Colace 100 mg po BID for constipation.  --Continue Lisinopril  5 mg po every day for elevated blood pressure.  --Started on Propranolol  10 mg po bid for elevated heart rate.  Other PRNS -Continue Tylenol  650 mg every 6 hours PRN for mild pain -Continue Maalox 30 ml Q 4 hrs  PRN for indigestion -Continue MOM 30 ml po Q 6 hrs for constipation  Safety and Monitoring: Voluntary admission to inpatient psychiatric unit for safety, stabilization and treatment Daily contact with patient to assess and evaluate symptoms and progress in treatment Patient's case to be discussed in multi-disciplinary team meeting Observation Level : q15 minute checks Vital signs: q12 hours Precautions: Safety  Discharge Planning: Social work and case management to assist with discharge planning and identification of hospital follow-up needs prior to discharge Estimated LOS: 5-7 days Discharge Concerns: Need to establish a safety plan; Medication compliance and effectiveness Discharge Goals: Return home with outpatient referrals for mental health follow-up including medication management/psychotherapy   I certify that inpatient services furnished can reasonably be expected to improve the patient's condition.    Mac Bolster, NP, pmhnp, fnp-bc. 10/21/2023, 3:37 PM Patient ID: Miranda Berger, female   DOB: [birth year], 68 y.o.   MRN: [id number] Patient ID: Miranda Berger, female   DOB: 1955/05/16, 67 y.o.   MRN: [id number] Patient ID: Miranda Berger, female   DOB: [birth year], 68 y.o.   MRN: [id number]

## 2023-10-21 NOTE — Group Note (Signed)
 Date:  10/21/2023 Time:  5:31 PM  Group Topic/Focus:  Recovery Goals:   The focus of this group is to identify appropriate goals for recovery and establish a plan to achieve them.    Additional Comments:  pt did not attend   Powell JAYSON Sharps 10/21/2023, 5:31 PM

## 2023-10-21 NOTE — Progress Notes (Signed)
   10/21/23 1300  Psych Admission Type (Psych Patients Only)  Admission Status Involuntary  Psychosocial Assessment  Patient Complaints Confusion;Anxiety  Eye Contact Fair  Facial Expression Flat  Affect Preoccupied  Speech Soft  Interaction Minimal  Motor Activity Slow  Appearance/Hygiene Unremarkable  Behavior Characteristics Cooperative  Mood Anxious  Thought Process  Coherency Circumstantial  Content Preoccupation  Delusions None reported or observed  Perception WDL  Hallucination None reported or observed  Judgment Poor  Confusion Mild  Danger to Self  Current suicidal ideation? Denies  Description of Suicide Plan No Plan  Agreement Not to Harm Self Yes  Description of Agreement Verbal  Danger to Others  Danger to Others None reported or observed

## 2023-10-22 NOTE — Progress Notes (Signed)
   10/22/23 2132  Psych Admission Type (Psych Patients Only)  Admission Status Involuntary  Psychosocial Assessment  Patient Complaints Anxiety  Eye Contact Fair  Facial Expression Flat  Affect Appropriate to circumstance  Speech Logical/coherent  Interaction Minimal  Motor Activity Slow (utilizing wheelchair)  Appearance/Hygiene In scrubs  Behavior Characteristics Appropriate to situation  Mood Pleasant  Thought Process  Coherency Circumstantial  Content Preoccupation  Delusions None reported or observed  Perception WDL  Hallucination None reported or observed  Judgment Impaired  Confusion None  Danger to Self  Current suicidal ideation? Denies  Agreement Not to Harm Self Yes  Description of Agreement verbal  Danger to Others  Danger to Others None reported or observed

## 2023-10-22 NOTE — Progress Notes (Addendum)
 [facility] MD Progress Note  10/22/2023 7:52 AM Camden Mazzaferro  MRN:  [id number]  Principal Problem: MDD (major depressive disorder), recurrent, severe, with psychosis (HCC)  Diagnosis: Principal Problem:   MDD (major depressive disorder), recurrent, severe, with psychosis (HCC)  Reason for admission: This is the first inpatient psychiatric admission for Miranda Berger, a 68 year old Caucasian female with prior psychiatric diagnoses significant for MDD recurrent, severe with psychotic features, OSA, GAD, and prior medical diagnoses significant for degenerative tear of medial meniscus of right knee, aortic stenosis, 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, asthma, LBBB, GERD, and family history of adverse reaction to anesthesia.  Patient presents involuntarily to [facility] for worsening depression and bizarre behaviors.     24-hour chart review: Chart reviewed. Patient's case discussed in interdisciplinary team meeting.  Vital signs reviewed without critical value. No as needed psychotropic medication required overnight.  She slept a documented 9.25 hours.  She is adherent to taking psychotropic medication regimen. Nursing notes indicate patient pt stated each morning after taking her trazodone  she feels she is om a fog. Pt asked to try a different medication for sleep.    Today's Assessment Notes: On assessment today, Amri reports her mood is pretty good and states she feels tired at times, though this has improved since medication changes.  She rates her anxiety and depression as 5/10, triggered primarily by being around people.  She reports good sleep last night without issues, improved appetite, fair energy, and improving concentration. Denies having any suicidal thoughts. Denies having any suicidal intent and plan. Denies having psychotic symptoms, with the exception of minimal paranoia. Denies having side effects to current psychiatric medications.  She reports  experiencing chest pressure yesterday, which she attributes to anxiety.  She states that her daily goal is I came here voluntarily and want to have a plan with getting out of here and going home.  Acknowledges involuntary status, clarifying yes, that is what I meant.  She reports being established with an outpatient counselor and psychiatric provider and resides with her husband.  Daphnee observed sitting with peers in the day room, ambulatory with the aid of a wheelchair.  She is alert, oriented, calm, and cooperative.  No overt psychotic symptoms or manic features noted.  Mood appears to be improving.  She appears to be tolerating medication well.  The case was reviewed with the attending psychiatrist; plan is to continue current treatment regimen.  Anticipated discharge scheduled for Monday, September 1.  Social work to Psychologist, counselling.     Total Time spent with patient: 45 minutes  Past Psychiatric History: Previous Psych Diagnoses: MDD with psychotic features, OSA, & GAD, Prior inpatient treatment: Denies Current/prior outpatient treatment: Yes, Michelle Gallomore at [facility] counseling Prior rehab hx: Denies Psychotherapy hx: Yes History of suicide: Denies History of homicide or aggression: Denies Psychiatric medication history: Reports trial medication of Remeron , Xanax , and Wellbutrin  Psychiatric medication compliance history: Noncompliant Neuromodulation history: Denies Current Psychiatrist: Darice Molt, NP Current therapist: Rosaline Battle  Past Medical History:  Past Medical History:  Diagnosis Date   Abdominal pain 06/22/2012   Anemia    in the past    Anxiety    Aortic stenosis 06/20/2017   Asthma    Chronic interstitial cystitis 06/22/2012   Costochondral chest pain 04/04/2018   Degenerative tear of medial meniscus of right knee 04/21/2016   Depression    Difficult or painful urination 06/22/2012   Dyspareunia 06/22/2012   Family history  of adverse  reaction to anesthesia    mom had nausea   Female pelvic pain 06/22/2012   GERD (gastroesophageal reflux disease)    Hepatitis    Hepatitis A in 3rd grade?   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   Obstructive sleep apnea    Pericarditis 09/12/2017   PONV (postoperative nausea and vomiting)    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 07/26/2017   [employer], size 23   Urinary urgency 06/22/2012    Past Surgical History:  Procedure Laterality Date   ABDOMINAL HYSTERECTOMY     AORTIC VALVE REPLACEMENT N/A 07/26/2017   Procedure: MINIMALLY INVASIVE AORTIC VALVE REPLACEMENT (AVR);  Surgeon: Dusty Sudie DEL, MD;  Location: [facility] OR;  Service: Open Heart Surgery;  Laterality: N/A;   BLADDER SURGERY  2011   bone spur removal Right 08/2022   great toe   CARDIAC CATHETERIZATION     CARPAL TUNNEL RELEASE  2012   CHOLECYSTECTOMY     RIGHT/LEFT HEART CATH AND CORONARY ANGIOGRAPHY N/A 07/08/2017   Procedure: RIGHT/LEFT HEART CATH AND CORONARY ANGIOGRAPHY;  Surgeon: Wonda Sharper, MD;  Location: [facility] INVASIVE CV LAB;  Service: Cardiovascular;  Laterality: N/A;   TEE WITHOUT CARDIOVERSION N/A 07/26/2017   Procedure: TRANSESOPHAGEAL ECHOCARDIOGRAM (TEE);  Surgeon: Dusty Sudie DEL, MD;  Location: [facility] OR;  Service: Open Heart Surgery;  Laterality: N/A;   TONSILLECTOMY     Family History:  Family History  Problem Relation Age of Onset   Stroke Mother    Leukemia Mother    Stroke Father    Sleep apnea Father    Valvular heart disease Brother    Sleep apnea Brother    Heart Problems Maternal Aunt    Valvular heart disease Maternal Aunt    Valvular heart disease Paternal  Grandfather    Family Psychiatric  History: See H&P Social History:  Social History   Substance and Sexual Activity  Alcohol Use No   Alcohol/week: 0.0 standard drinks of alcohol     Social History   Substance and Sexual Activity  Drug Use No    Social History   Socioeconomic History   Marital status: Married    Spouse name: Not on file   Number of children: 1   Years of education: BS   Highest education level: Not on file  Occupational History   Not on file  Tobacco Use   Smoking status: Never   Smokeless tobacco: Never  Vaping Use   Vaping status: Never Used  Substance and Sexual Activity   Alcohol use: No    Alcohol/week: 0.0 standard drinks of alcohol   Drug use: No   Sexual activity: Not on file  Other Topics Concern   Not on file  Social History Narrative   Occasionally consumes tea or coffee   Social Drivers of Corporate investment banker Strain: Not on file  Food Insecurity: No Food Insecurity (10/16/2023)   Hunger Vital Sign    Worried About Running Out of Food in the Last Year: Never true    Ran Out of Food in the Last Year: Never true  Transportation Needs: No Transportation Needs (10/16/2023)   PRAPARE - Administrator, Civil Service (Medical): No  Lack of Transportation (Non-Medical): No  Physical Activity: Inactive (10/06/2017)   Exercise Vital Sign    Days of Exercise per Week: 0 days    Minutes of Exercise per Session: 0 min  Stress: Stress Concern Present (10/06/2017)   [employer] of Occupational Health - Occupational Stress Questionnaire    Feeling of Stress : Rather much  Social Connections: Patient Unable To Answer (10/17/2023)   Social Connection and Isolation Panel    Frequency of Communication with Friends and Family: Patient unable to answer    Frequency of Social Gatherings with Friends and Family: Patient unable to answer    Attends Religious Services: Patient unable to answer    Active Member of Clubs or  Organizations: Patient unable to answer    Attends Banker Meetings: Patient unable to answer    Marital Status: Patient unable to answer   Additional Social History:   Sleep: Good Estimated Sleeping Duration (Last 24 Hours): 7.25-8.50 hours  Appetite:  Good  Current Medications: Current Facility-Administered Medications  Medication Dose Route Frequency Provider Last Rate Last Admin   acetaminophen  (TYLENOL ) tablet 650 mg  650 mg Oral Q6H PRN Tex Drilling, NP   650 mg at 10/18/23 2126   albuterol  (VENTOLIN  HFA) 108 (90 Base) MCG/ACT inhaler 2 puff  2 puff Inhalation Q6H PRN Tex Drilling, NP       alum & mag hydroxide-simeth (MAALOX/MYLANTA) 200-200-20 MG/5ML suspension 30 mL  30 mL Oral Q4H PRN Tex Drilling, NP       aspirin  EC tablet 81 mg  81 mg Oral Daily Nkwenti, Doris, NP   81 mg at 10/21/23 0848   docusate sodium  (COLACE) capsule 100 mg  100 mg Oral BID Kennyth Starleen RAMAN, MD   100 mg at 10/21/23 1724   hydrOXYzine  (ATARAX ) tablet 25 mg  25 mg Oral TID Ntuen, Tina C, FNP   25 mg at 10/21/23 1724   lisinopril  (ZESTRIL ) tablet 5 mg  5 mg Oral Daily Nwoko, Agnes I, NP   5 mg at 10/21/23 9150   LORazepam  (ATIVAN ) tablet 1 mg  1 mg Oral Q6H PRN Parker, Alvin S, MD       magnesium  hydroxide (MILK OF MAGNESIA) suspension 30 mL  30 mL Oral Daily PRN Tex Drilling, NP       mirtazapine  (REMERON ) tablet 7.5 mg  7.5 mg Oral QHS Nkwenti, Doris, NP   7.5 mg at 10/21/23 2112   montelukast  (SINGULAIR ) tablet 10 mg  10 mg Oral Daily Nkwenti, Doris, NP   10 mg at 10/21/23 9150   multivitamin with minerals tablet 1 tablet  1 tablet Oral Daily Tex Drilling, NP   1 tablet at 10/21/23 0848   OLANZapine  (ZYPREXA ) injection 5 mg  5 mg Intramuscular TID PRN Tex Drilling, NP       OLANZapine  zydis (ZYPREXA ) disintegrating tablet 5 mg  5 mg Oral TID PRN Tex Drilling, NP       pantoprazole  (PROTONIX ) EC tablet 40 mg  40 mg Oral Daily Nkwenti, Doris, NP   40 mg at 10/21/23 0849    propranolol  (INDERAL ) tablet 10 mg  10 mg Oral BID Nwoko, Agnes I, NP   10 mg at 10/21/23 1724   risperiDONE  (RISPERDAL  M-TABS) disintegrating tablet 3 mg  3 mg Oral BID Parker, Alvin S, MD   3 mg at 10/21/23 1724   thiamine  (Vitamin B-1) tablet 100 mg  100 mg Oral Daily Nkwenti, Doris, NP   100 mg at 10/21/23 [phone]  traZODone  (DESYREL ) tablet 50 mg  50 mg Oral QHS PRN Ntuen, Tina C, FNP   50 mg at 10/20/23 2155    Lab Results:  No results found for this or any previous visit (from the past 48 hours).  Blood Alcohol level:  Lab Results  Component Value Date   [facility] <15 10/16/2023   ETH <15 10/13/2023   Metabolic Disorder Labs: Lab Results  Component Value Date   HGBA1C 5.6 10/16/2023   MPG 114.02 10/16/2023   MPG 111.15 07/22/2017   No results found for: PROLACTIN Lab Results  Component Value Date   CHOL 209 (H) 10/16/2023   TRIG 86 10/16/2023   HDL 83 10/16/2023   CHOLHDL 2.5 10/16/2023   VLDL 17 10/16/2023   LDLCALC 109 (H) 10/16/2023   Physical Findings: AIMS:  ,  ,  ,  ,  ,  ,   CIWA:  CIWA-Ar Total: 2 COWS:     Musculoskeletal: Strength & Muscle Tone: within normal limits Gait & Station: normal Patient leans: N/A  Psychiatric Specialty Exam:  Presentation  General Appearance:  Casual (Sitting up on a wheel chair.)  Eye Contact: Good  Speech: Clear and Coherent; Normal Rate  Speech Volume: Normal  Handedness: Right  Mood and Affect  Mood: -- (Showing some improvement today.)  Affect: Congruent  Thought Process  Thought Processes: Coherent  Descriptions of Associations:Intact  Orientation:Full (Time, Place and Person)  Thought Content:Logical  History of Schizophrenia/Schizoaffective disorder:No  Duration of Psychotic Symptoms:N/A  Hallucinations:No data recorded  Ideas of Reference:None  Suicidal Thoughts:No data recorded  Homicidal Thoughts:No data recorded  Sensorium  Memory: Immediate Good; Recent Fair; Remote  Fair  Judgment: Fair  Insight: Fair  Art therapist  Concentration: Good  Attention Span: Good  Recall: Fair  Fund of Knowledge: Fair  Language: Good  Psychomotor Activity  Psychomotor Activity: No data recorded  Assets  Assets: Communication Skills; Desire for Improvement; Financial Resources/Insurance; Housing; Resilience; Social Support  Sleep  Sleep: No data recorded  Physical Exam: Physical Exam Vitals and nursing note reviewed.  HENT:     Mouth/Throat:     Pharynx: Oropharynx is clear.  Cardiovascular:     Rate and Rhythm: Normal rate.     Pulses: Normal pulses.  Pulmonary:     Effort: No respiratory distress.  Abdominal:     Comments: Deferred yes  Genitourinary:    Comments: Deferred Skin:    General: Skin is dry.  Neurological:     General: No focal deficit present.     Mental Status: She is alert and oriented to person, place, and time.    Review of Systems  Constitutional: Negative.   Respiratory: Negative.    Cardiovascular: Negative.   Gastrointestinal: Negative.  Nausea: As needed of Zofran  given for nausea.  Genitourinary: Negative.   Musculoskeletal:  Negative for falls.  Skin: Negative.   Neurological: Negative.   Endo/Heme/Allergies:        See allergy listing  Psychiatric/Behavioral:  Positive for depression, hallucinations and suicidal ideas. Negative for substance abuse. The patient is nervous/anxious. The patient does not have insomnia.    Blood pressure 131/71, pulse 96, temperature 98.2 F (36.8 C), temperature source Oral, resp. rate 17, height 5' 4 (1.626 m), weight 72 kg, SpO2 98%. Body mass index is 27.26 kg/m.  Treatment Plan Summary: Daily contact with patient to assess and evaluate symptoms and progress in treatment and Medication management  Continue inpatient hospitalization.  Will continue today 10/22/2023 plan as below  except where it is noted.   Principal/active diagnoses.  MDD (major depressive  disorder), recurrent, severe, with psychosis (HCC)  Plan: The risks/benefits/side-effects/alternatives to the medications in use were discussed in detail with the patient and time was given for patient's questions. The patient consents to medication trial.   Continue Mirtazapine  7.5 mg nightly, insomnia. Continue Risperdal  M-tabs 3 mg po BID, mood control.  Continue Hydroxyzine  25 mg po TID PRN, anxiety.  Discontinue Trazodone  50 mg nightly PRN, insomnia 8/30 Continue the PRN CIWA as recommended for alcohol/benzodiazepine withdrawal management.  Agitation protocols.  Continue as recommended (see MAR).  Other medical. --Continue albuterol  inhaler 2 puffs Q 6 hrs prn for SOB. --Continue aspirin  81 mg daily, heart health. --Continue montelukast  tablet 10 mg daily, asthma --Continue Protonix  EC tablet 40 mg daily for GERD --Continue MVI tablets daily, supplement --Continue Colace 100 mg BID, constipation.  --Continue Lisinopril  5 mg daily, elevated blood pressure.  --Started on Propranolol  10 mg po BID for elevated heart rate 8/29  Other PRNS -Continue Tylenol  650 mg every 6 hours PRN, mild pain -Continue Maalox 30 mL Q 4 hrs PRN, indigestion -Continue MOM 30 mLQ 6 hrs, constipation  Safety and Monitoring: Voluntary admission to inpatient psychiatric unit for safety, stabilization and treatment Daily contact with patient to assess and evaluate symptoms and progress in treatment Patient's case to be discussed in multi-disciplinary team meeting Observation Level : q15 minute checks Vital signs: q12 hours Precautions: Safety  Discharge Planning: Social work and case management to assist with discharge planning and identification of hospital follow-up needs prior to discharge Estimated LOS: 5-7 days Discharge Concerns: Need to establish a safety plan; Medication compliance and effectiveness Discharge Goals: Return home with outpatient referrals for mental health follow-up including  medication management/psychotherapy   I certify that inpatient services furnished can reasonably be expected to improve the patient's condition.   I personally spent a total of 45 minutes in the care of the patient today including preparing to see the patient, getting/reviewing separately obtained history, counseling and educating, placing orders, documenting clinical information in the EHR, and coordinating care.       Blair Chiquita Hint, NP 10/22/2023, 7:52 AM Patient ID: Ricka Saunders Plater, female   DOB: 05/21/1955, 68 y.o.   MRN: [id number] Patient ID: Mara Favero, female   DOB: 1955-11-21, 68 y.o.   MRN: [id number] Patient ID: Tasfia Vasseur, female   DOB: 14-Aug-1955, 68 y.o.   MRN: [id number] Patient ID: Cheray Pardi, female   DOB: 21-Jun-1955, 68 y.o.   MRN: [id number]

## 2023-10-22 NOTE — Progress Notes (Signed)
(  Sleep Hours) -9.25 (Any PRNs that were needed, meds refused, or side effects to meds)- none (Any disturbances and when (visitation, over night)-none (Concerns raised by the patient)- pt stated each morning after taking her trazodone  she feels she is om a fog. Pt asked to try a different medication for sleep. (SI/HI/AVH)-Denied

## 2023-10-22 NOTE — Progress Notes (Signed)
   10/22/23 0900  Psychosocial Assessment  Patient Complaints Anxiety  Eye Contact Fair  Facial Expression Flat  Affect Preoccupied  Speech Soft  Interaction Minimal  Motor Activity Slow  Appearance/Hygiene In scrubs  Behavior Characteristics Cooperative;Appropriate to situation  Mood Pleasant  Thought Process  Coherency Circumstantial  Content Preoccupation  Delusions None reported or observed  Perception WDL  Hallucination None reported or observed  Judgment Impaired  Confusion Mild  Danger to Self  Current suicidal ideation? Denies  Danger to Others  Danger to Others None reported or observed

## 2023-10-22 NOTE — Group Note (Signed)
 Date:  10/22/2023 Time:  3:57 PM  Group Topic/Focus:  Healthy vs Unhealthy Coping Strategies     Participation Level:  Did Not Attend    Miranda Berger 10/22/2023, 3:57 PM

## 2023-10-22 NOTE — Group Note (Signed)
 Date:  10/22/2023 Time:  9:13 AM  Group Topic/Focus:  Goals Group:   [name]    Participation Level:  Active  Participation Quality:  Appropriate  Affect:  Appropriate  Cognitive:  Appropriate  Insight: Appropriate  Engagement in Group:  Improving  Modes of Intervention:  Discussion    Elinora Weigand A Tyde Lamison 10/22/2023, 9:13 AM

## 2023-10-22 NOTE — Group Note (Signed)
 LCSW Group Therapy Note   Group Date: 10/22/2023 Start Time: 0100 End Time: 0200   Type of Therapy and Topic:  Group Therapy: Communication  Participation Level:  Active  Description of Group: In this group patients will be encouraged to explore how individuals communicate with one another appropriately and in appropriately. The patients will be guided to discuss their thoughts, feelings and behaviors related to barriers communicating feelings, needs and stressors. The group will process together ways to execute positive and appropriate communications, with attention given to how one-use behaviors, tone and body languages to communicate. Each patient will be encouraged to identify specific changes that are authority, and parents. This group will be process-oriented, with patients participating in exploration of their own experiences as well as giving and receiving support and challenging self as well as other group members.   Therapeutic Goals:  1.  Patient will identify how people communicate (body language, facial expression and electronic) Also discuss tone, voice and how this impact what is communication and how they message is perceived.   2.  Patient will identify feelings (such as fear and worry) thought process and behaviors related to why people internalize feelings rather than express self openly 3.  Patient will identify two changes they are willing to make to overcome communication barriers 4.  Patient will demonstrate ability to communicate their needs and set boundaries  through discussion  Summary of Patient Progress:  The patient  was in a wheel chair present/active throughout the session and proved open to feedback from CSW and peers. Patient demonstrated calm insight into the subject matter, was respectful of peers, and was present throughout the entire session.  Therapeutic Modalities:   Cognitive Behavioral Therapy Solution-Focused Therapy  Shaelee Forni O  Oddie Kuhlmann, LCSWA 10/22/2023  3:34 PM

## 2023-10-22 NOTE — Plan of Care (Signed)

## 2023-10-22 NOTE — Group Note (Signed)
 Date:  10/22/2023 Time:  9:01 PM  Group Topic/Focus:  Wrap-Up Group:   The focus of this group is to help patients review their daily goal of treatment and discuss progress on daily workbooks. Shared Coping Skills: The focus of this group is to help patients identify what skills they have learned while in recovery to help them cope in their day to day life.    Participation Level:  Did Not Attend   Additional Comments:    Miranda Berger 10/22/2023, 9:01 PM

## 2023-10-22 NOTE — Plan of Care (Signed)
   Problem: Education: Goal: Emotional status will improve Outcome: Progressing Goal: Mental status will improve Outcome: Progressing Goal: Verbalization of understanding the information provided will improve Outcome: Progressing

## 2023-10-23 NOTE — Plan of Care (Signed)
   Problem: Education: Goal: Emotional status will improve Outcome: Progressing Goal: Mental status will improve Outcome: Progressing

## 2023-10-23 NOTE — Plan of Care (Signed)
   Problem: Education: Goal: Verbalization of understanding the information provided will improve Outcome: Progressing   Problem: Education: Goal: Mental status will improve Outcome: Not Progressing

## 2023-10-23 NOTE — Progress Notes (Signed)
(  Sleep Hours) -  8 hours (Any PRNs that were needed, meds refused, or side effects to meds)- None (Any disturbances and when (visitation, overnight)- None (Concerns raised by the patient)- None (SI/HI/AVH)- Denies

## 2023-10-23 NOTE — Progress Notes (Addendum)
 D. Pt continues to utilize wheelchair due to feeling weak and having 'low energy'. Pt reported sleeping well last night, described her concentration and appetite as 'good'. Per pt's self inventory, pt rated her depression, hopelessness and anxiety all 0's. Pt's stated goal today is being who God created me to be, focus on God instead of other things.  Pt currently denies SI/HI and AVH and doesn't appear to be responding to internal stimuli. A. Labs and vitals monitored. Pt given and educated on medications. Pt supported emotionally and encouraged to express concerns and ask questions.   R. Pt remains safe with 15 minute checks. Will continue POC.    10/23/23 1200  Psych Admission Type (Psych Patients Only)  Admission Status Involuntary  Psychosocial Assessment  Patient Complaints Anxiety  Eye Contact Fair  Facial Expression Flat  Affect Appropriate to circumstance  Speech Logical/coherent  Interaction Minimal  Motor Activity Slow  Appearance/Hygiene In scrubs  Behavior Characteristics Cooperative;Appropriate to situation  Mood Depressed;Pleasant  Thought Process  Coherency Circumstantial  Content Preoccupation  Delusions None reported or observed  Perception WDL  Hallucination None reported or observed  Judgment Impaired  Confusion Mild  Danger to Self  Current suicidal ideation? Denies  Danger to Others  Danger to Others None reported or observed

## 2023-10-23 NOTE — Progress Notes (Signed)
 [facility] MD Progress Note  10/23/2023 3:38 PM Miranda Berger  MRN:  [id number]  Principal Problem: MDD (major depressive disorder), recurrent, severe, with psychosis (HCC)  Diagnosis: Principal Problem:   MDD (major depressive disorder), recurrent, severe, with psychosis (HCC)  Reason for admission: This is the first inpatient psychiatric admission for Miranda Berger, a 68 year old Caucasian female with prior psychiatric diagnoses significant for MDD recurrent, severe with psychotic features, OSA, GAD, and prior medical diagnoses significant for degenerative tear of medial meniscus of right knee, aortic stenosis, 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, asthma, LBBB, GERD, and family history of adverse reaction to anesthesia.  Patient presents involuntarily to [facility] for worsening depression and bizarre behaviors.     24-hour chart review: Chart reviewed. Patient's case discussed in interdisciplinary team meeting.  Vital signs reviewed without critical value. No as needed psychotropic medication required overnight.  She slept a documented 8.0 hours.  She is adherent to taking psychotropic medication regimen.   Today's Assessment Notes: On assessment today, Miranda Berger reports that her mood is euthymic, improved since admission, and stable. Reports that anxiety symptoms are at manageable level. Sleep and appetite reported stable. Concentration is without complaint. Reports energy level is improving. Denies having any suicidal thoughts. Denies having any suicidal intent and plan. Denies having any homicidal ideation. Denies having psychotic symptoms. Denies having side effects to current psychiatric medications.  Reports that she awoke at 5:30 AM to use the bathroom, then had breakfast and a bath, stating that she wore herself out and is learning not to move so fast.    Discussed discharge planning and emphasized medication adherence and attendance at follow-up appointments  to prevent symptom exacerbation.    Miranda Berger was observed lying in bed with a wheelchair at the bedside. She is alert and oriented.  Appears to be tolerating medication well.  Appears clinically stable and does not exhibit signs of psychosis or mania.  Discharge is planned for Monday, October 24, 2023, to return to live with her husband, pending continued stability and absence of new concerns. Social work will Psychologist, counselling.     Total Time spent with patient: 45 minutes  Past Psychiatric History: Previous Psych Diagnoses: MDD with psychotic features, OSA, & GAD, Prior inpatient treatment: Denies Current/prior outpatient treatment: Yes, Michelle Gallomore at [facility] counseling Prior rehab hx: Denies Psychotherapy hx: Yes History of suicide: Denies History of homicide or aggression: Denies Psychiatric medication history: Reports trial medication of Remeron , Xanax , and Wellbutrin  Psychiatric medication compliance history: Noncompliant Neuromodulation history: Denies Current Psychiatrist: Darice Molt, NP Current therapist: Rosaline Battle  Past Medical History:  Past Medical History:  Diagnosis Date   Abdominal pain 06/22/2012   Anemia    in the past    Anxiety    Aortic stenosis 06/20/2017   Asthma    Chronic interstitial cystitis 06/22/2012   Costochondral chest pain 04/04/2018   Degenerative tear of medial meniscus of right knee 04/21/2016   Depression    Difficult or painful urination 06/22/2012   Dyspareunia 06/22/2012   Family history of adverse reaction to anesthesia    mom had nausea   Female pelvic pain 06/22/2012   GERD (gastroesophageal reflux disease)    Hepatitis    Hepatitis A in 3rd grade?   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  MDD (major depressive  disorder), recurrent severe, 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   Obstructive sleep apnea    Pericarditis 09/12/2017   PONV (postoperative nausea and vomiting)    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 07/26/2017   [employer], size 23   Urinary urgency 06/22/2012    Past Surgical History:  Procedure Laterality Date   ABDOMINAL HYSTERECTOMY     AORTIC VALVE REPLACEMENT N/A 07/26/2017   Procedure: MINIMALLY INVASIVE AORTIC VALVE REPLACEMENT (AVR);  Surgeon: Dusty Sudie DEL, MD;  Location: [facility] OR;  Service: Open Heart Surgery;  Laterality: N/A;   BLADDER SURGERY  2011   bone spur removal Right 08/2022   great toe   CARDIAC CATHETERIZATION     CARPAL TUNNEL RELEASE  2012   CHOLECYSTECTOMY     RIGHT/LEFT HEART CATH AND CORONARY ANGIOGRAPHY N/A 07/08/2017   Procedure: RIGHT/LEFT HEART CATH AND CORONARY ANGIOGRAPHY;  Surgeon: Wonda Sharper, MD;  Location: [facility] INVASIVE CV LAB;  Service: Cardiovascular;  Laterality: N/A;   TEE WITHOUT CARDIOVERSION N/A 07/26/2017   Procedure: TRANSESOPHAGEAL ECHOCARDIOGRAM (TEE);  Surgeon: Dusty Sudie DEL, MD;  Location: [facility] OR;  Service: Open Heart Surgery;  Laterality: N/A;   TONSILLECTOMY     Family History:  Family History  Problem Relation Age of Onset   Stroke Mother    Leukemia Mother    Stroke Father    Sleep apnea Father    Valvular heart disease Brother    Sleep apnea Brother    Heart Problems Maternal Aunt    Valvular heart disease Maternal Aunt    Valvular heart disease Paternal Grandfather    Family Psychiatric  History: See H&P Social History:  Social History   Substance and Sexual Activity  Alcohol Use No   Alcohol/week: 0.0 standard drinks of alcohol     Social History   Substance and Sexual Activity  Drug Use No    Social History   Socioeconomic History   Marital status: Married    Spouse name: Not on file   Number of children: 1   Years of education: BS   Highest  education level: Not on file  Occupational History   Not on file  Tobacco Use   Smoking status: Never   Smokeless tobacco: Never  Vaping Use   Vaping status: Never Used  Substance and Sexual Activity   Alcohol use: No    Alcohol/week: 0.0 standard drinks of alcohol   Drug use: No   Sexual activity: Not on file  Other Topics Concern   Not on file  Social History Narrative   Occasionally consumes tea or coffee   Social Drivers of Corporate investment banker Strain: Not on file  Food Insecurity: No Food Insecurity (10/16/2023)   Hunger Vital Sign    Worried About Running Out of Food in the Last Year: Never true    Ran Out of Food in the Last Year: Never true  Transportation Needs: No Transportation Needs (10/16/2023)   PRAPARE - Administrator, Civil Service (Medical): No    Lack of Transportation (Non-Medical): No  Physical Activity: Inactive (10/06/2017)   Exercise Vital Sign    Days of Exercise per Week: 0 days    Minutes of Exercise per Session: 0 min  Stress: Stress Concern Present (10/06/2017)   [employer] of Occupational Health - Occupational Stress Questionnaire    Feeling of Stress : Rather much  Social Connections: Patient Unable To Answer (10/17/2023)   Social  Connection and Isolation Panel    Frequency of Communication with Friends and Family: Patient unable to answer    Frequency of Social Gatherings with Friends and Family: Patient unable to answer    Attends Religious Services: Patient unable to answer    Active Member of Clubs or Organizations: Patient unable to answer    Attends Banker Meetings: Patient unable to answer    Marital Status: Patient unable to answer   Additional Social History:   Sleep: Good Estimated Sleeping Duration (Last 24 Hours): 6.00-7.00 hours  Appetite:  Good  Current Medications: Current Facility-Administered Medications  Medication Dose Route Frequency Provider Last Rate Last Admin   acetaminophen   (TYLENOL ) tablet 650 mg  650 mg Oral Q6H PRN Tex Drilling, NP   650 mg at 10/23/23 1129   albuterol  (VENTOLIN  HFA) 108 (90 Base) MCG/ACT inhaler 2 puff  2 puff Inhalation Q6H PRN Tex Drilling, NP       alum & mag hydroxide-simeth (MAALOX/MYLANTA) 200-200-20 MG/5ML suspension 30 mL  30 mL Oral Q4H PRN Tex Drilling, NP       aspirin  EC tablet 81 mg  81 mg Oral Daily Nkwenti, Doris, NP   81 mg at 10/23/23 9185   docusate sodium  (COLACE) capsule 100 mg  100 mg Oral BID Parker, Alvin S, MD   100 mg at 10/23/23 0815   hydrOXYzine  (ATARAX ) tablet 25 mg  25 mg Oral TID Ntuen, Tina C, FNP   25 mg at 10/23/23 1129   lisinopril  (ZESTRIL ) tablet 5 mg  5 mg Oral Daily Nwoko, Agnes I, NP   5 mg at 10/23/23 0815   LORazepam  (ATIVAN ) tablet 1 mg  1 mg Oral Q6H PRN Parker, Alvin S, MD       magnesium  hydroxide (MILK OF MAGNESIA) suspension 30 mL  30 mL Oral Daily PRN Tex Drilling, NP       mirtazapine  (REMERON ) tablet 7.5 mg  7.5 mg Oral QHS Nkwenti, Doris, NP   7.5 mg at 10/22/23 2109   montelukast  (SINGULAIR ) tablet 10 mg  10 mg Oral Daily Nkwenti, Doris, NP   10 mg at 10/23/23 0815   multivitamin with minerals tablet 1 tablet  1 tablet Oral Daily Tex Drilling, NP   1 tablet at 10/23/23 9184   OLANZapine  (ZYPREXA ) injection 5 mg  5 mg Intramuscular TID PRN Tex Drilling, NP       OLANZapine  zydis (ZYPREXA ) disintegrating tablet 5 mg  5 mg Oral TID PRN Tex Drilling, NP       pantoprazole  (PROTONIX ) EC tablet 40 mg  40 mg Oral Daily Nkwenti, Doris, NP   40 mg at 10/23/23 0815   propranolol  (INDERAL ) tablet 10 mg  10 mg Oral BID Nwoko, Agnes I, NP   10 mg at 10/23/23 0815   risperiDONE  (RISPERDAL  M-TABS) disintegrating tablet 3 mg  3 mg Oral BID Parker, Alvin S, MD   3 mg at 10/23/23 0815   thiamine  (Vitamin B-1) tablet 100 mg  100 mg Oral Daily Nkwenti, Doris, NP   100 mg at 10/23/23 9184    Lab Results:  No results found for this or any previous visit (from the past 48 hours).  Blood Alcohol  level:  Lab Results  Component Value Date   [facility] <15 10/16/2023   [facility] <15 10/13/2023   Metabolic Disorder Labs: Lab Results  Component Value Date   HGBA1C 5.6 10/16/2023   MPG 114.02 10/16/2023   MPG 111.15 07/22/2017   No results found  for: PROLACTIN Lab Results  Component Value Date   CHOL 209 (H) 10/16/2023   TRIG 86 10/16/2023   HDL 83 10/16/2023   CHOLHDL 2.5 10/16/2023   VLDL 17 10/16/2023   LDLCALC 109 (H) 10/16/2023   Physical Findings: AIMS:  ,  ,  ,  ,  ,  ,   CIWA:  CIWA-Ar Total: 0 COWS:     Musculoskeletal: Strength & Muscle Tone: within normal limits Gait & Station: normal Patient leans: N/A  Psychiatric Specialty Exam:  Presentation  General Appearance:  Casual  Eye Contact: Good  Speech: Clear and Coherent; Normal Rate  Speech Volume: Normal  Handedness: Right  Mood and Affect  Mood: Euthymic (Good.)  Affect: Congruent  Thought Process  Thought Processes: Coherent  Descriptions of Associations:Intact  Orientation:Full (Time, Place and Person)  Thought Content:Logical  History of Schizophrenia/Schizoaffective disorder:No  Duration of Psychotic Symptoms:N/A  Hallucinations:Hallucinations: None   Ideas of Reference:None  Suicidal Thoughts:No data recorded  Homicidal Thoughts:Homicidal Thoughts: No   Sensorium  Memory: Immediate Good; Recent Fair; Remote Fair  Judgment: Fair  Insight: Fair  Art therapist  Concentration: Good  Attention Span: Good  Recall: Fair  Fund of Knowledge: Fair  Language: Good  Psychomotor Activity  Psychomotor Activity: Psychomotor Activity: Other (comment) (Stable)   Assets  Assets: Communication Skills; Desire for Improvement; Housing; Resilience; Social Support  Sleep  Sleep: Sleep: Good   Physical Exam: Physical Exam Vitals and nursing note reviewed.  HENT:     Mouth/Throat:     Pharynx: Oropharynx is clear.  Cardiovascular:     Rate and  Rhythm: Normal rate.     Pulses: Normal pulses.  Pulmonary:     Effort: No respiratory distress.  Abdominal:     Comments: Deferred yes  Genitourinary:    Comments: Deferred Skin:    General: Skin is dry.  Neurological:     General: No focal deficit present.     Mental Status: She is alert and oriented to person, place, and time.    Review of Systems  Constitutional: Negative.   Respiratory: Negative.    Cardiovascular: Negative.   Gastrointestinal: Negative.  Nausea: As needed of Zofran  given for nausea.  Genitourinary: Negative.   Musculoskeletal:  Negative for falls.  Skin: Negative.   Neurological: Negative.   Endo/Heme/Allergies:        See allergy listing  Psychiatric/Behavioral:  Positive for depression, hallucinations and suicidal ideas. Negative for substance abuse. The patient is nervous/anxious. The patient does not have insomnia.    Blood pressure 122/84, pulse 90, temperature 98.1 F (36.7 C), temperature source Oral, resp. rate 14, height 5' 4 (1.626 m), weight 72 kg, SpO2 99%. Body mass index is 27.26 kg/m.  Treatment Plan Summary: Daily contact with patient to assess and evaluate symptoms and progress in treatment and Medication management  Continue inpatient hospitalization.  Will continue today 10/23/2023 plan as below except where it is noted.   Principal/active diagnoses.  MDD (major depressive disorder), recurrent, severe, with psychosis (HCC)  Plan: The risks/benefits/side-effects/alternatives to the medications in use were discussed in detail with the patient and time was given for patient's questions. The patient consents to medication trial.   Continue Mirtazapine  7.5 mg nightly, insomnia. Continue Risperdal  M-tabs 3 mg po BID, mood control.  Continue Hydroxyzine  25 mg po TID PRN, anxiety.  Discontinue Trazodone  50 mg nightly PRN, insomnia 8/30 Continue the PRN CIWA as recommended for alcohol/benzodiazepine withdrawal management.  Agitation  protocols.  Continue as recommended (  see MAR).  Other medical. --Continue albuterol  inhaler 2 puffs Q 6 hrs prn for SOB. --Continue aspirin  81 mg daily, heart health. --Continue montelukast  tablet 10 mg daily, asthma --Continue Protonix  EC tablet 40 mg daily for GERD --Continue MVI tablets daily, supplement --Continue Colace 100 mg BID, constipation.  --Continue Lisinopril  5 mg daily, elevated blood pressure.  --Started on Propranolol  10 mg po BID for elevated heart rate 8/29  Other PRNS -Continue Tylenol  650 mg every 6 hours PRN, mild pain -Continue Maalox 30 mL Q 4 hrs PRN, indigestion -Continue MOM 30 mLQ 6 hrs, constipation  Safety and Monitoring: Voluntary admission to inpatient psychiatric unit for safety, stabilization and treatment Daily contact with patient to assess and evaluate symptoms and progress in treatment Patient's case to be discussed in multi-disciplinary team meeting Observation Level : q15 minute checks Vital signs: q12 hours Precautions: Safety  Discharge Planning: Social work and case management to assist with discharge planning and identification of hospital follow-up needs prior to discharge Estimated LOS: 5-7 days Discharge Concerns: Need to establish a safety plan; Medication compliance and effectiveness Discharge Goals: Return home with outpatient referrals for mental health follow-up including medication management/psychotherapy   I certify that inpatient services furnished can reasonably be expected to improve the patient's condition.   I personally spent a total of 45 minutes in the care of the patient today including preparing to see the patient, getting/reviewing separately obtained history, counseling and educating, placing orders, documenting clinical information in the EHR, and coordinating care.       Blair Chiquita Hint, NP 10/23/2023, 3:38 PM Patient ID: Miranda Berger, female   DOB: 10-02-1955, 68 y.o.   MRN: [id number] Patient  ID: Miranda Berger, female   DOB: 22-Dec-1955, 68 y.o.   MRN: [id number] Patient ID: Miranda Berger, female   DOB: May 05, 1955, 68 y.o.   MRN: [id number] Patient ID: Miranda Berger, female   DOB: 03-11-1955, 68 y.o.   MRN: [id number] Patient ID: Miranda Berger, female   DOB: Jul 04, 1955, 68 y.o.   MRN: [id number]

## 2023-10-23 NOTE — Group Note (Signed)
 Date:  10/23/2023 Time:  8:54 PM  Group Topic/Focus:  Wrap-Up Group:   The focus of this group is to help patients review their daily goal of treatment and discuss progress on daily workbooks.    Participation Level:  Did Not Attend  Participation Quality:  none  Affect:  none  Cognitive:  none  Insight: None  Engagement in Group:  None  Modes of Intervention:  none  Additional Comments:  Pt was encouraged but refused to attend wrap up group  Braeley Buskey A Kalaya Infantino 10/23/2023, 8:54 PM

## 2023-10-23 NOTE — [facility] Group Notes (Signed)
 [facility] Group Notes:  (Nursing/MHT/Case Management/Adjunct)  Date:  10/23/2023  Time:  2:41 PM  Type of Therapy: Bingo  Participation Level:  Did Not Attend  Summary of Progress/Problems:  Patient did not attend or participate in Bingo. Patient was encouraged but refused.   Danette JONELLE Boos 10/23/2023, 2:41 PM

## 2023-10-23 NOTE — [facility] Group Notes (Signed)
 [facility] Group Notes:  (Nursing/MHT/Case Management/Adjunct)  Date:  10/23/2023  Time:  9:21 AM  Type of Therapy:  Group Topic/ Focus: Goals Group: [name]   Participation Level:  Did Not Attend  Summary of Progress/Problems:  Patient did not attend goals group today. Patient was encouraged but refused.   Danette JONELLE Boos 10/23/2023, 9:21 AM

## 2023-10-23 NOTE — Group Note (Signed)
 Date:  10/23/2023 Time:  4:02 PM  Group Topic/Focus:  The focus of this group is to introduce collage as a creative outlet for expressing emotions, reducing anxiety, fostering group cohesion and enabling personal insight.    Participation Level:  Active  Participation Quality:  Appropriate and Attentive  Affect:  Appropriate  Cognitive:  Alert and Appropriate  Insight: Appropriate and Good  Engagement in Group:  Engaged  Modes of Intervention:  Activity, Discussion, Exploration, Rapport Building, Socialization, and Support  Additional Comments:    Berwyn GORMAN Acosta 10/23/2023, 4:02 PM

## 2023-10-24 DIAGNOSIS — F333 Major depressive disorder, recurrent, severe with psychotic symptoms: Secondary | ICD-10-CM | POA: Diagnosis not present

## 2023-10-24 MED ORDER — RISPERIDONE 3 MG PO TABS: 3.0000 mg | ORAL_TABLET | Freq: Two times a day (BID) | ORAL | 0 refills | Status: DC

## 2023-10-24 MED ORDER — LISINOPRIL 5 MG PO TABS: 5.0000 mg | ORAL_TABLET | Freq: Every day | ORAL | 0 refills | Status: AC

## 2023-10-24 MED ORDER — PROPRANOLOL HCL 10 MG PO TABS: 10.0000 mg | ORAL_TABLET | Freq: Two times a day (BID) | ORAL | 0 refills | Status: DC

## 2023-10-24 MED ORDER — PANTOPRAZOLE SODIUM 40 MG PO TBEC: 40.0000 mg | DELAYED_RELEASE_TABLET | Freq: Every day | ORAL | 0 refills | Status: AC

## 2023-10-24 MED ORDER — HYDROXYZINE HCL 25 MG PO TABS: 25.0000 mg | ORAL_TABLET | Freq: Three times a day (TID) | ORAL | 0 refills | Status: DC | PRN

## 2023-10-24 MED ORDER — MIRTAZAPINE 7.5 MG PO TABS: 7.5000 mg | ORAL_TABLET | Freq: Every day | ORAL | 0 refills | Status: DC

## 2023-10-24 NOTE — BH Assessment (Signed)
 (  Sleep Hours) - 7.5 (Any PRNs that were needed, meds refused, or side effects to meds)- None (Any disturbances and when (visitation, over night)- None (Concerns raised by the patient)- None (SI/HI/AVH)- Denies

## 2023-10-24 NOTE — Progress Notes (Signed)
 Patient discharged from [facility] on 10/24/23 at 1110. Patient denies SI, plan, and intention. Suicide safety plan completed, reviewed with this RN, given to the patient, and a copy in the chart. Patient denies HI/AVH upon discharge. Patient rates her depression a 0/10 and her anxiety a 0/10. Patient is alert, oriented, and cooperative. RN provided patient with discharge paperwork and reviewed information with patient. Patient expressed that she understood all of the discharge instructions. Pt was satisfied with belongings returned to her from the locker and at bedside. Discharged patient to [facility] waiting room. Pt's husband awaiting patient in the [facility] waiting room.

## 2023-10-24 NOTE — Plan of Care (Signed)
   Problem: Education: Goal: Emotional status will improve Outcome: Progressing Goal: Mental status will improve Outcome: Progressing Goal: Verbalization of understanding the information provided will improve Outcome: Progressing   Problem: Activity: Goal: Interest or engagement in activities will improve Outcome: Progressing Goal: Sleeping patterns will improve Outcome: Progressing

## 2023-10-24 NOTE — Progress Notes (Signed)
   10/23/23 2015  Psych Admission Type (Psych Patients Only)  Admission Status Involuntary  Psychosocial Assessment  Patient Complaints Disorientation  Eye Contact Fair  Facial Expression Flat  Affect Preoccupied  Speech Soft  Interaction Isolative  Motor Activity Unsteady (Pt using W/C.)  Appearance/Hygiene In scrubs  Behavior Characteristics Appropriate to situation  Mood Pleasant  Thought Process  Coherency Disorganized  Content Preoccupation  Delusions None reported or observed  Perception WDL  Hallucination None reported or observed  Judgment Poor  Confusion Mild  Danger to Self  Current suicidal ideation?  (Denies)  Agreement Not to Harm Self Yes  Description of Agreement Notify Staff.  Danger to Others  Danger to Others None reported or observed

## 2023-10-24 NOTE — Progress Notes (Signed)
  [facility] Adult Case Management Discharge Plan :  Will you be returning to the same living situation after discharge:  Yes,  patient will be returning home to address on file. At discharge, do you have transportation home?: Yes,  patient's husband, Shantal Roan, will be providing transportation at 10:30 AM.  Do you have the ability to pay for your medications: Yes,  patient has active health insurance.  Release of information consent forms completed and in the chart;  Patient's signature needed at discharge.  Patient to Follow up at:  Follow-up Information     [facility] Follow up on 12/22/2023.   Why: You have an appointment 12/22/23 at 9:30 am for medication management services, Virtual telehealth appt.  You are on a cancellation list for a sooner appointment.  Please inquire about therapy services with this provider if needed. Contact information: [address] [place] [address] [phone]                 Next level of care provider has access to [facility] Link:no  Safety Planning and Suicide Prevention discussed: Yes,  completed with patient's husband, Amos Gaber, [phone].     Has patient been referred to the Quitline?: Patient does not use tobacco/nicotine products  Patient has been referred for addiction treatment: No known substance use disorder.  Louetta Lame, LCSWA 10/24/2023, 9:21 AM

## 2023-10-24 NOTE — Group Note (Signed)
 Date:  10/24/2023 Time:  8:54 AM  Group Topic/Focus:  Goals Group:   [name] [name]    Participation Level:  Active  Participation Quality:  Appropriate  Affect:  Appropriate  Cognitive:  Appropriate  Insight: Appropriate  Engagement in Group:  Engaged  Modes of Intervention:  Orientation  Additional Comments:  goal is to work on anxiety  Rankin JONETTA Sierra 10/24/2023, 8:54 AM

## 2023-10-24 NOTE — Plan of Care (Signed)
   Problem: Education: Goal: Knowledge of [place] [facility] Education information/materials will improve Outcome: Progressing Goal: Emotional status will improve Outcome: Progressing Goal: Mental status will improve Outcome: Progressing Goal: Verbalization of understanding the information provided will improve Outcome: Progressing

## 2023-10-24 NOTE — [facility] Suicide Risk Assessment (Addendum)
 Suicide Risk Assessment  Discharge Assessment    [facility] Discharge Suicide Risk Assessment   Principal Problem: MDD (major depressive disorder), recurrent, severe, with psychosis (HCC) Discharge Diagnoses: Principal Problem:   MDD (major depressive disorder), recurrent, severe, with psychosis (HCC)  Reason for admission: This is the first inpatient psychiatric admission for Miranda Berger, a 68 year old Caucasian female with prior psychiatric diagnoses significant for MDD recurrent, severe with psychotic features, OSA, GAD, and prior medical diagnoses significant for degenerative tear of medial meniscus of right knee, aortic stenosis, 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, asthma, LBBB, GERD, and family history of adverse reaction to anesthesia.  Patient presents involuntarily to [facility] for worsening depression and bizarre behaviors.    As per [facility] notes:PT's husband states this morning the pt [place]'s husband escorted the pt back to their home. PT states she just wanted to be in a field with Jesus and a blue sky.   Total Time spent with patient: 45 minutes  Musculoskeletal: Strength & Muscle Tone: within normal limits Gait & Station: normal Patient leans: N/A  Psychiatric Specialty Exam  Presentation  General Appearance:  Appropriate for Environment; Casual  Eye Contact: Fair  Speech: Clear and Coherent  Speech Volume: Normal  Handedness: Right  Mood and Affect  Mood: Euthymic  Duration of Depression Symptoms: Less than two weeks  Affect: Congruent  Thought Process  Thought Processes: Coherent  Descriptions of Associations:Intact  Orientation:Full (Time, Place and Person)  Thought Content:Logical  History of Schizophrenia/Schizoaffective disorder:No  Duration of Psychotic Symptoms:N/A  Hallucinations:Hallucinations: None Description of Auditory Hallucinations:  N/A Description of Visual Hallucinations: N/A  Ideas of Reference:None  Suicidal Thoughts:Suicidal Thoughts: No SI Passive Intent and/or Plan: -- (Denies)  Homicidal Thoughts:Homicidal Thoughts: No  Sensorium  Memory: Immediate Good; Recent Good  Judgment: Fair  Insight: Fair  Art therapist  Concentration: Good  Attention Span: Fair  Recall: Fair  Fund of Knowledge: Fair  Language: Good  Psychomotor Activity  Psychomotor Activity: Psychomotor Activity: Normal  Assets  Assets: Communication Skills; Desire for Improvement; Physical Health; Resilience  Sleep  Sleep: Sleep: Good  Estimated Sleeping Duration (Last 24 Hours): 5.50-7.00 hours  Physical Exam: Physical Exam Vitals reviewed.  Constitutional:      General: She is not in acute distress.    Appearance: She is not ill-appearing.  HENT:     Head: Normocephalic.     Right Ear: External ear normal.     Left Ear: External ear normal.     Mouth/Throat:     Mouth: Mucous membranes are moist.     Pharynx: Oropharynx is clear.  Eyes:     Extraocular Movements: Extraocular movements intact.  Cardiovascular:     Rate and Rhythm: Tachycardia present.     Comments: BP 106/78, HR 104. Pt remains asymptomatic. Nursing staff to recheck VS. Abdominal:     Comments: Deferred  Genitourinary:    Comments: Deferred Musculoskeletal:        General: Normal range of motion.     Cervical back: Normal range of motion.  Skin:    General: Skin is warm.  Neurological:     General: No focal deficit present.     Mental Status: She is alert.  Psychiatric:        Mood and Affect: Mood normal.        Behavior: Behavior normal.    Review of Systems  Constitutional:  Negative for  chills and fever.  HENT:  Negative for sore throat.   Eyes:  Negative for blurred vision.  Respiratory:  Negative for cough, sputum production, shortness of breath and wheezing.   Cardiovascular:  Negative for chest pain and  palpitations.  Gastrointestinal:  Negative for abdominal pain, constipation, diarrhea, heartburn, nausea and vomiting.  Genitourinary:  Negative for dysuria.  Musculoskeletal:  Negative for falls and myalgias.  Skin:  Negative for itching and rash.  Neurological:  Negative for dizziness and headaches.  Endo/Heme/Allergies:        See allergy listing  Psychiatric/Behavioral:  Positive for depression (Stable with medication). Negative for hallucinations, substance abuse and suicidal ideas. The patient is nervous/anxious (Improved with medication). The patient does not have insomnia.    Blood pressure 106/78, pulse (!) 104, temperature 98 F (36.7 C), temperature source Oral, resp. rate 18, height 5' 4 (1.626 m), weight 72 kg, SpO2 98%. Body mass index is 27.26 kg/m.  Mental Status Per Nursing Assessment::   On Admission:  NA  Demographic Factors:  Caucasian, Low socioeconomic status, and Unemployed  Loss Factors: Financial problems/change in socioeconomic status  Historical Factors: Family history of mental illness or substance abuse  Risk Reduction Factors:   Living with another person, especially a relative, Positive social support, Positive therapeutic relationship, and Positive coping skills or problem solving skills  Continued Clinical Symptoms:  Depression:   Recent sense of peace/wellbeing  Cognitive Features That Contribute To Risk:  Polarized thinking    Suicide Risk:  Mild: There are no identifiable plans, no associated intent, mild dysphoria and related symptoms, good self-control (both objective and subjective assessment), few other risk factors, and identifiable protective factors, including available and accessible social support.   Follow-up Information     [facility] Follow up on 12/22/2023.   Why: You have an appointment 12/22/23 at 9:30 am for medication management services, Virtual telehealth appt.  You are  on a cancellation list for a sooner appointment.  Please inquire about therapy services with this provider if needed. Contact information: [address] [place] [phone]                Plan Of Care/Follow-up recommendations:  Discharge Recommendations:  The patient is being discharged with her family. Patient is to take her discharge medications as ordered.  See follow up above. We recommend that she participate in individual therapy to target uncontrollable agitation and substance abuse.  We recommend that she participate in family therapy to target the conflict with his family, to improve communication skills and conflict resolution skills.  Family is to initiate/implement a contingency based behavioral model to address patient's behavior. We recommend that he get AIMS scale, height, weight, blood pressure, fasting lipid panel, fasting blood sugar in three months from discharge as she's on atypical antipsychotics.  Patient will benefit from monitoring of recurrent suicidal ideation since patient is on antidepressant medication. The patient should abstain from all illicit substances and alcohol.  If the patient's symptoms worsen or do not continue to improve or if the patient becomes actively suicidal or homicidal then it is recommended that the patient return to the closest hospital emergency room or call 911 for further evaluation and treatment. National Suicide Prevention Lifeline 1800-SUICIDE or [phone]) [phone]. Please follow up with your primary medical doctor for all other medical needs.  The patient has been educated on the possible side effects to medications and she/his guardian is to contact a medical  professional and inform outpatient provider of any new side effects of medication. She is to take regular diet and activity as tolerated.  Will benefit from moderate daily exercise. Family was educated about removing/locking any firearms, medications or  dangerous products from the home.  Activity: As tolerated Diet: Regular Diet  Ellouise JAYSON Azure, FNP 10/24/2023, 10:47 AM

## 2023-10-24 NOTE — Progress Notes (Signed)
   10/24/23 0903  Psych Admission Type (Psych Patients Only)  Admission Status Involuntary  Psychosocial Assessment  Patient Complaints Other (Comment) (Fatigue)  Eye Contact Fair  Facial Expression Flat  Affect Preoccupied  Speech Soft  Interaction Assertive  Motor Activity Unsteady  Appearance/Hygiene Unremarkable  Behavior Characteristics Appropriate to situation  Mood Pleasant  Thought Process  Coherency WDL  Content WDL  Delusions None reported or observed  Perception WDL  Hallucination None reported or observed  Judgment Impaired  Confusion None  Danger to Self  Current suicidal ideation? Denies  Description of Suicide Plan No plan  Agreement Not to Harm Self Yes  Description of Agreement Verbal  Danger to Others  Danger to Others None reported or observed

## 2023-10-24 NOTE — Discharge Summary (Addendum)
 Physician Discharge Summary Note  Patient:  Miranda Berger is an 68 y.o., female MRN:  [id number] DOB:  01-18-1956 Patient phone:  [phone] (home)  Patient address:   [address] [place] [address],   Total Time spent with patient: 45 minutes  Date of Admission:  10/16/2023 Date of Discharge:   10/24/2023  Reason for Admission:  This is the first inpatient psychiatric admission for Huong Luthi, a 68 year old Caucasian female with prior psychiatric diagnoses significant for MDD recurrent, severe with psychotic features, OSA, GAD, and prior medical diagnoses significant for degenerative tear of medial meniscus of right knee, aortic stenosis, 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, asthma, LBBB, GERD, and family history of adverse reaction to anesthesia.  Patient presents involuntarily to [facility] for worsening depression and bizarre behaviors.    As per [facility] notes:PT's husband states this morning the pt went outside [place]clothed [place] PT's husband escorted the pt back to their home. PT states she just wanted to be in a field with Jesus and a blue sky.    Principal Problem: MDD (major depressive disorder), recurrent, severe, with psychosis (HCC) Discharge Diagnoses: Principal Problem:   MDD (major depressive disorder), recurrent, severe, with psychosis (HCC)  Past Psychiatric History: Previous Psych Diagnoses: MDD with psychotic features, OSA, & GAD, Prior inpatient treatment: Denies Current/prior outpatient treatment: Yes, Michelle Gallomore at [facility] counseling Prior rehab hx: Denies Psychotherapy hx: Yes History of suicide: Denies History of homicide or aggression: Denies Psychiatric medication history: Reports trial medication of Remeron , Xanax , and Wellbutrin  Psychiatric medication compliance history: Noncompliant Neuromodulation history: Denies Current Psychiatrist: Darice Molt, NP Current  therapist: Rosaline Battle  Past Medical History:  Past Medical History:  Diagnosis Date   Abdominal pain 06/22/2012   Anemia    in the past    Anxiety    Aortic stenosis 06/20/2017   Asthma    Chronic interstitial cystitis 06/22/2012   Costochondral chest pain 04/04/2018   Degenerative tear of medial meniscus of right knee 04/21/2016   Depression    Difficult or painful urination 06/22/2012   Dyspareunia 06/22/2012   Family history of adverse reaction to anesthesia    mom had nausea   Female pelvic pain 06/22/2012   GERD (gastroesophageal reflux disease)    Hepatitis    Hepatitis A in 3rd grade?   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   Obstructive sleep apnea    Pericarditis 09/12/2017   PONV (postoperative nausea and vomiting)    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 07/26/2017   [employer], size 23   Urinary urgency 06/22/2012    Past Surgical History:  Procedure Laterality Date   ABDOMINAL HYSTERECTOMY     AORTIC VALVE REPLACEMENT N/A 07/26/2017   Procedure: MINIMALLY INVASIVE AORTIC VALVE REPLACEMENT (AVR);  Surgeon: Dusty Sudie DEL, MD;  Location: [facility] OR;  Service: Open Heart Surgery;  Laterality: N/A;   BLADDER SURGERY  2011   bone spur removal Right 08/2022   great toe   CARDIAC CATHETERIZATION     CARPAL TUNNEL RELEASE  2012   CHOLECYSTECTOMY     RIGHT/LEFT HEART CATH AND CORONARY ANGIOGRAPHY N/A 07/08/2017   Procedure: RIGHT/LEFT  HEART CATH AND CORONARY ANGIOGRAPHY;  Surgeon: Wonda Sharper, MD;  Location: [facility] INVASIVE CV LAB;  Service: Cardiovascular;  Laterality: N/A;   TEE WITHOUT CARDIOVERSION N/A 07/26/2017   Procedure:  TRANSESOPHAGEAL ECHOCARDIOGRAM (TEE);  Surgeon: Dusty Sudie DEL, MD;  Location: [facility] OR;  Service: Open Heart Surgery;  Laterality: N/A;   TONSILLECTOMY     Family History:  Family History  Problem Relation Age of Onset   Stroke Mother    Leukemia Mother    Stroke Father    Sleep apnea Father    Valvular heart disease Brother    Sleep apnea Brother    Heart Problems Maternal Aunt    Valvular heart disease Maternal Aunt    Valvular heart disease Paternal Grandfather    Family Psychiatric  History: See H&P Social History:  Social History   Substance and Sexual Activity  Alcohol Use No   Alcohol/week: 0.0 standard drinks of alcohol     Social History   Substance and Sexual Activity  Drug Use No    Social History   Socioeconomic History   Marital status: Married    Spouse name: Not on file   Number of children: 1   Years of education: BS   Highest education level: Not on file  Occupational History   Not on file  Tobacco Use   Smoking status: Never   Smokeless tobacco: Never  Vaping Use   Vaping status: Never Used  Substance and Sexual Activity   Alcohol use: No    Alcohol/week: 0.0 standard drinks of alcohol   Drug use: No   Sexual activity: Not on file  Other Topics Concern   Not on file  Social History Narrative   Occasionally consumes tea or coffee   Social Drivers of Corporate investment banker Strain: Not on file  Food Insecurity: No Food Insecurity (10/16/2023)   Hunger Vital Sign    Worried About Running Out of Food in the Last Year: Never true    Ran Out of Food in the Last Year: Never true  Transportation Needs: No Transportation Needs (10/16/2023)   PRAPARE - Administrator, Civil Service (Medical): No    Lack of Transportation (Non-Medical): No  Physical Activity: Inactive (10/06/2017)   Exercise Vital Sign    Days of Exercise per Week: 0 days    Minutes of Exercise per Session: 0 min  Stress: Stress Concern Present (10/06/2017)    [employer] of Occupational Health - Occupational Stress Questionnaire    Feeling of Stress : Rather much  Social Connections: Patient Unable To Answer (10/17/2023)   Social Connection and Isolation Panel    Frequency of Communication with Friends and Family: Patient unable to answer    Frequency of Social Gatherings with Friends and Family: Patient unable to answer    Attends Religious Services: Patient unable to answer    Active Member of Clubs or Organizations: Patient unable to answer    Attends Banker Meetings: Patient unable to answer    Marital Status: Patient unable to answer   Hospital Course:  During the patient's hospitalization, patient had extensive initial psychiatric evaluation, and follow-up psychiatric evaluations every day.  Psychiatric diagnoses provided upon initial assessment:  Diagnosis:  Principal Problem:   MDD (major depressive disorder), recurrent, severe, with psychosis (HCC)  Patient's psychiatric medications were adjusted on admission:  --Continue Remeron  tablets 7.5 mg p.o. daily at bedtime for depression and sleep --Continue Wellbutrin  XL 150 mg 24-hour tablet p.o.  daily for depression --Initiate Risperdal  2 mg p.o. daily at bedtime for psychosis  --Initiate hydroxyzine  25 mg p.o. 3 times daily as needed for anxiety  --Initiate trazodone  50 mg tablet p.o. nightly as needed for insomnia   During the hospitalization, other adjustments were made to the patient's psychiatric medication regimen: Risperdal  was changed to 3 mg p.o. daily twice daily for psychosis Wellbutrin  was discontinued due to none effectiveness.  Patient's care was discussed during the interdisciplinary team meeting every day during the hospitalization.  The patient denies having side effects to prescribed psychiatric medication.  Gradually, patient started adjusting to milieu. The patient was evaluated each day by a clinical provider to ascertain response to treatment.  Improvement was noted by the patient's report of decreasing symptoms, improved sleep and appetite, affect, medication tolerance, behavior, and participation in unit programming.  Patient was asked each day to complete a self inventory noting mood, mental status, pain, new symptoms, anxiety and concerns.    Symptoms were reported as significantly decreased or resolved completely by discharge.   On day of discharge, the patient reports that their mood is stable. The patient denied having suicidal thoughts for more than 48 hours prior to discharge.  Patient denies having homicidal thoughts.  Patient denies having auditory hallucinations.  Patient denies any visual hallucinations or other symptoms of psychosis. The patient was motivated to continue taking medication with a goal of continued improvement in mental health.   The patient reports their target psychiatric symptoms of mood disorder responded well to the psychiatric medications, and the patient reports overall benefit other psychiatric hospitalization. Supportive psychotherapy was provided to the patient. The patient also participated in regular group therapy while hospitalized. Coping skills, problem solving as well as relaxation therapies were also part of the unit programming.  Labs were reviewed with the patient, and abnormal results were discussed with the patient.  The patient is able to verbalize their individual safety plan to this provider.  # It is recommended to the patient to continue psychiatric medications as prescribed, after discharge from the hospital.    # It is recommended to the patient to follow up with your outpatient psychiatric provider and PCP.  # It was discussed with the patient, the impact of alcohol, drugs, tobacco have been there overall psychiatric and medical wellbeing, and total abstinence from substance use was recommended the patient.ed.  # Prescriptions provided or sent directly to preferred pharmacy at  discharge. Patient agreeable to plan. Given opportunity to ask questions. Appears to feel comfortable with discharge.    # In the event of worsening symptoms, the patient is instructed to call the crisis hotline, 911 and or go to the nearest ED for appropriate evaluation and treatment of symptoms. To follow-up with primary care provider for other medical issues, concerns and or health care needs  # Patient was discharged to home with husband with a plan to follow up as noted below.   Physical Findings: AIMS:  , ,  ,  ,  ,  ,   CIWA:  CIWA-Ar Total: 0 COWS:     Musculoskeletal: Strength & Muscle Tone: within normal limits Gait & Station: normal Patient leans: N/A  Psychiatric Specialty Exam:  Presentation  General Appearance:  Appropriate for Environment; Casual  Eye Contact: Fair  Speech: Clear and Coherent  Speech Volume: Normal  Handedness: Right  Mood and Affect  Mood: Euthymic  Affect: Congruent  Thought Process  Thought Processes: Coherent  Descriptions of Associations:Intact  Orientation:Full (Time,  Place and Person)  Thought Content:Logical  History of Schizophrenia/Schizoaffective disorder:No  Duration of Psychotic Symptoms:N/A  Hallucinations:Hallucinations: None Description of Auditory Hallucinations: N/A Description of Visual Hallucinations: N/A  Ideas of Reference:None  Suicidal Thoughts:Suicidal Thoughts: No SI Passive Intent and/or Plan: -- (Denies)  Homicidal Thoughts:Homicidal Thoughts: No  Sensorium  Memory: Immediate Good; Recent Good  Judgment: Fair  Insight: Fair  Art therapist  Concentration: Good  Attention Span: Fair  Recall: Fair  Fund of Knowledge: Fair  Language: Good  Psychomotor Activity  Psychomotor Activity: Psychomotor Activity: Normal  Assets  Assets: Communication Skills; Desire for Improvement; Physical Health; Resilience  Sleep  Sleep: Sleep: Good  Estimated Sleeping  Duration (Last 24 Hours): 5.50-7.00 hours  Physical Exam: Physical Exam Vitals and nursing note reviewed.  HENT:     Head: Normocephalic.     Right Ear: External ear normal.  Cardiovascular:     Rate and Rhythm: Tachycardia present.  Pulmonary:     Effort: Pulmonary effort is normal.  Abdominal:     Comments: Deferred  Genitourinary:    Comments: Deferred Musculoskeletal:        General: Normal range of motion.     Cervical back: Normal range of motion.  Skin:    General: Skin is warm.  Neurological:     General: No focal deficit present.     Mental Status: She is alert and oriented to person, place, and time.  Psychiatric:        Mood and Affect: Mood normal.        Behavior: Behavior normal.    Review of Systems  Constitutional:  Negative for chills and fever.  HENT:  Negative for sore throat.   Eyes:  Negative for blurred vision.  Respiratory:  Negative for cough, sputum production, shortness of breath and wheezing.   Cardiovascular:  Negative for chest pain and palpitations.  Gastrointestinal:  Negative for abdominal pain, constipation, diarrhea, heartburn, nausea and vomiting.  Genitourinary:  Negative for dysuria, frequency and urgency.  Musculoskeletal:  Negative for falls.  Skin:  Negative for itching and rash.  Neurological:  Negative for dizziness and headaches.  Endo/Heme/Allergies:        See allergy listing  Psychiatric/Behavioral:  Positive for depression (Stable with medication). Negative for hallucinations, substance abuse and suicidal ideas. The patient is nervous/anxious (Improved with medication). The patient does not have insomnia.    Blood pressure 106/78, pulse (!) 104, temperature 98 F (36.7 C), temperature source Oral, resp. rate 18, height 5' 4 (1.626 m), weight 72 kg, SpO2 98%. Body mass index is 27.26 kg/m.  Social History   Tobacco Use  Smoking Status Never  Smokeless Tobacco Never   Tobacco Cessation:  N/A, patient does not  currently use tobacco products  Blood Alcohol level:  Lab Results  Component Value Date   [facility] <15 10/16/2023   ETH <15 10/13/2023   Metabolic Disorder Labs:  Lab Results  Component Value Date   HGBA1C 5.6 10/16/2023   MPG 114.02 10/16/2023   MPG 111.15 07/22/2017   No results found for: PROLACTIN Lab Results  Component Value Date   CHOL 209 (H) 10/16/2023   TRIG 86 10/16/2023   HDL 83 10/16/2023   CHOLHDL 2.5 10/16/2023   VLDL 17 10/16/2023   LDLCALC 109 (H) 10/16/2023   See Psychiatric Specialty Exam and Suicide Risk Assessment completed by Attending Physician prior to discharge.  Discharge destination:  Home  Is patient on multiple antipsychotic therapies at discharge:  No  Has Patient had three or more failed trials of antipsychotic monotherapy by history:  No  Recommended Plan for Multiple Antipsychotic Therapies: NA  Discharge Instructions     Increase activity slowly   Complete by: As directed       Allergies as of 10/24/2023       Reactions   Codeine  Itching   Prednisone  Itching        Medication List     STOP taking these medications    ALPRAZolam  0.5 MG tablet Commonly known as: XANAX    buPROPion  300 MG 24 hr tablet Commonly known as: WELLBUTRIN  XL   omeprazole 40 MG capsule Commonly known as: PRILOSEC Replaced by: pantoprazole  40 MG tablet   QUEtiapine  25 MG tablet Commonly known as: SEROquel        TAKE these medications      Indication  acetaminophen  500 MG tablet Commonly known as: TYLENOL  Take 1,000 mg by mouth every 8 (eight) hours as needed.  Indication: Pain   albuterol  108 (90 Base) MCG/ACT inhaler Commonly known as: VENTOLIN  HFA Inhale 2 puffs into the lungs every 6 (six) hours as needed for wheezing or shortness of breath.  Indication: Asthma   aspirin  EC 81 MG tablet Take 81 mg by mouth daily.  Indication: Primary prevention   calcium  carbonate 600 MG Tabs tablet Commonly known as: OS-CAL Take 600 mg by  mouth daily.  Indication: Osteopenia   cyanocobalamin  500 MCG tablet Commonly known as: VITAMIN B12 Take 500 mcg by mouth daily.  Indication: Inadequate Vitamin B12   fluticasone  50 MCG/ACT nasal spray Commonly known as: FLONASE  Place 1 spray into both nostrils daily as needed for allergies.  Indication: Allergic Rhinitis   Fluticasone -Salmeterol 250-50 MCG/DOSE Aepb Commonly known as: ADVAIR Inhale 1 puff into the lungs 2 (two) times daily.  Indication: Asthma   hydrOXYzine  25 MG tablet Commonly known as: ATARAX  Take 1 tablet (25 mg total) by mouth every 8 (eight) hours as needed.  Indication: Feeling Anxious   levocetirizine 5 MG tablet Commonly known as: XYZAL Take 5 mg by mouth daily.  Indication: Hayfever   lisinopril  5 MG tablet Commonly known as: ZESTRIL  Take 1 tablet (5 mg total) by mouth daily. Start taking on: October 25, 2023  Indication: High Blood Pressure   mirtazapine  7.5 MG tablet Commonly known as: REMERON  Take 1 tablet (7.5 mg total) by mouth at bedtime for 14 days. What changed:  medication strength how much to take  Indication: sleep   montelukast  10 MG tablet Commonly known as: SINGULAIR  Take 10 mg by mouth daily.  Indication: Hayfever   multivitamin capsule Take 1 capsule by mouth daily.  Indication: Supplement   pantoprazole  40 MG tablet Commonly known as: PROTONIX  Take 1 tablet (40 mg total) by mouth daily for 14 days. Start taking on: October 25, 2023 Replaces: omeprazole 40 MG capsule  Indication: Gastroesophageal Reflux Disease   propranolol  10 MG tablet Commonly known as: INDERAL  Take 1 tablet (10 mg total) by mouth 2 (two) times daily for 14 days.  Indication: Elevated heart rate.   risperiDONE  3 MG tablet Commonly known as: RISPERDAL  Take 1 tablet (3 mg total) by mouth 2 (two) times daily.  Indication: MDD w/ psychotic features   saccharomyces boulardii 250 MG capsule Commonly known as: FLORASTOR Take 250 mg by  mouth daily.  Indication: Constipation        Follow-up Information     [facility] Follow up on 12/22/2023.  Why: You have an appointment 12/22/23 at 9:30 am for medication management services, Virtual telehealth appt.  You are on a cancellation list for a sooner appointment.  Please inquire about therapy services with this provider if needed. Contact information: [address] [place] [address] [phone]                Follow-up recommendations:   Discharge Recommendations:  The patient is being discharged with her family. Patient is to take her discharge medications as ordered.  See follow up above. We recommend that she participate in individual therapy to target uncontrollable agitation and substance abuse.  We recommend that she participate in family therapy to target the conflict with his family, to improve communication skills and conflict resolution skills.  Family is to initiate/implement a contingency based behavioral model to address patient's behavior. We recommend that he get AIMS scale, height, weight, blood pressure, fasting lipid panel, fasting blood sugar in three months from discharge as she's on atypical antipsychotics.  Patient will benefit from monitoring of recurrent suicidal ideation since patient is on antidepressant medication. The patient should abstain from all illicit substances and alcohol.  If the patient's symptoms worsen or do not continue to improve or if the patient becomes actively suicidal or homicidal then it is recommended that the patient return to the closest hospital emergency room or call 911 for further evaluation and treatment. National Suicide Prevention Lifeline 1800-SUICIDE or 214-391-0007. Please follow up with your primary medical doctor for all other medical needs.  The patient has been educated on the possible side effects to medications and she/his guardian is  to contact a medical professional and inform outpatient provider of any new side effects of medication. She is to take regular diet and activity as tolerated.  Will benefit from moderate daily exercise. Family was educated about removing/locking any firearms, medications or dangerous products from the home.  Activity: As tolerated Diet: Regular Diet  Signed:  Ellouise JAYSON Azure, FNP 10/24/2023, 11:01 AM

## 2023-11-15 ENCOUNTER — Ambulatory Visit: Admitting: Podiatry

## 2023-11-15 VITALS — Ht 64.0 in | Wt 158.8 lb

## 2023-11-15 DIAGNOSIS — B351 Tinea unguium: Secondary | ICD-10-CM | POA: Diagnosis not present

## 2023-11-15 NOTE — Progress Notes (Signed)
 Subjective:   Patient ID: Miranda Berger, female   DOB: 68 y.o.   MRN: [id number]   HPI Chief Complaint  Patient presents with   Nail Problem    RM 14 Patient is here for bilateral onychomycosis. Right index toe nail is thickened with a dark discoloration.     68 year old female presents the office today for follow-up evaluation of nail fungus.  Previously was on Lamisil  and completed the course.  She still been using topical medication.  She states that she was in the hospital and was not taking care of her feet as much as she should have but now she is back to using the topical.     Objective:  Physical Exam  General: AAO x3, NAD  Dermatological: Nails are hypertrophic, dystrophic with yellow discoloration.  There clear along the nail fold and overall the color is doing much better.  There is no edema, erythema or signs of infection of the toenail sites.    Vascular: Dorsalis Pedis artery and Posterior Tibial artery pedal pulses are 2/4 bilateral with immedate capillary fill time.  There is no pain with calf compression, swelling, warmth, erythema.   Neruologic: Grossly intact via light touch bilateral.   Musculoskeletal: No areas of discomfort today.  Gait: Unassisted, Nonantalgic.       Assessment:   Onychomycosis     Plan:  -Treatment options discussed including all alternatives, risks, and complications -Etiology of symptoms were discussed - Sharply debrided  nails with any complications or bleeding x 10 as a courtesy to the topical antifungal medication to work more effectively.   Return in about 3 months (around 02/14/2024), or if symptoms worsen or fail to improve, for nail fungus .  Miranda Berger DPM

## 2023-11-16 ENCOUNTER — Encounter (HOSPITAL_COMMUNITY): Payer: Self-pay | Admitting: Emergency Medicine

## 2023-11-16 ENCOUNTER — Emergency Department (HOSPITAL_COMMUNITY)
Admission: EM | Admit: 2023-11-16 | Discharge: 2023-11-16 | Disposition: A | Discharge status: Home / Self Care | Attending: Emergency Medicine | Admitting: Emergency Medicine

## 2023-11-16 ENCOUNTER — Emergency Department (HOSPITAL_COMMUNITY)

## 2023-11-16 DIAGNOSIS — S3991XA Unspecified injury of abdomen, initial encounter: Secondary | ICD-10-CM | POA: Insufficient documentation

## 2023-11-16 DIAGNOSIS — S2020XA Contusion of thorax, unspecified, initial encounter: Secondary | ICD-10-CM | POA: Insufficient documentation

## 2023-11-16 DIAGNOSIS — R270 Ataxia, unspecified: Secondary | ICD-10-CM | POA: Diagnosis not present

## 2023-11-16 DIAGNOSIS — Z7951 Long term (current) use of inhaled steroids: Secondary | ICD-10-CM | POA: Diagnosis not present

## 2023-11-16 DIAGNOSIS — Z8249 Family history of ischemic heart disease and other diseases of the circulatory system: Secondary | ICD-10-CM | POA: Insufficient documentation

## 2023-11-16 DIAGNOSIS — I10 Essential (primary) hypertension: Secondary | ICD-10-CM | POA: Insufficient documentation

## 2023-11-16 DIAGNOSIS — I6782 Cerebral ischemia: Secondary | ICD-10-CM | POA: Diagnosis not present

## 2023-11-16 DIAGNOSIS — J45909 Unspecified asthma, uncomplicated: Secondary | ICD-10-CM | POA: Insufficient documentation

## 2023-11-16 DIAGNOSIS — S3993XA Unspecified injury of pelvis, initial encounter: Secondary | ICD-10-CM | POA: Diagnosis not present

## 2023-11-16 DIAGNOSIS — F32A Depression, unspecified: Secondary | ICD-10-CM | POA: Insufficient documentation

## 2023-11-16 DIAGNOSIS — S2232XA Fracture of one rib, left side, initial encounter for closed fracture: Secondary | ICD-10-CM | POA: Diagnosis not present

## 2023-11-16 DIAGNOSIS — S20212A Contusion of left front wall of thorax, initial encounter: Secondary | ICD-10-CM | POA: Diagnosis not present

## 2023-11-16 DIAGNOSIS — R531 Weakness: Secondary | ICD-10-CM | POA: Insufficient documentation

## 2023-11-16 DIAGNOSIS — I700 Atherosclerosis of aorta: Secondary | ICD-10-CM | POA: Insufficient documentation

## 2023-11-16 DIAGNOSIS — R0781 Pleurodynia: Secondary | ICD-10-CM | POA: Diagnosis not present

## 2023-11-16 DIAGNOSIS — J90 Pleural effusion, not elsewhere classified: Secondary | ICD-10-CM | POA: Insufficient documentation

## 2023-11-16 DIAGNOSIS — Z7982 Long term (current) use of aspirin: Secondary | ICD-10-CM | POA: Insufficient documentation

## 2023-11-16 DIAGNOSIS — Z952 Presence of prosthetic heart valve: Secondary | ICD-10-CM | POA: Insufficient documentation

## 2023-11-16 DIAGNOSIS — W0110XA Fall on same level from slipping, tripping and stumbling with subsequent striking against unspecified object, initial encounter: Secondary | ICD-10-CM | POA: Diagnosis not present

## 2023-11-16 DIAGNOSIS — Z79899 Other long term (current) drug therapy: Secondary | ICD-10-CM | POA: Insufficient documentation

## 2023-11-16 LAB — COMPREHENSIVE METABOLIC PANEL WITH GFR
ALT: 18 U/L (ref 0–44)
AST: 22 U/L (ref 15–41)
Albumin: 4.4 g/dL (ref 3.5–5.0)
Alkaline Phosphatase: 68 U/L (ref 38–126)
Anion gap: 11 (ref 5–15)
BUN: 13 mg/dL (ref 8–23)
CO2: 25 mmol/L (ref 22–32)
Calcium: 9.5 mg/dL (ref 8.9–10.3)
Chloride: 101 mmol/L (ref 98–111)
Creatinine, Ser: 0.82 mg/dL (ref 0.44–1.00)
GFR, Estimated: >60 mL/min (ref >60)
Glucose, Bld: 159 mg/dL — ABNORMAL HIGH (ref 70–99)
Potassium: 3.7 mmol/L (ref 3.5–5.1)
Sodium: 137 mmol/L (ref 135–145)
Total Bilirubin: 0.3 mg/dL (ref 0.0–1.2)
Total Protein: 6.7 g/dL (ref 6.5–8.1)

## 2023-11-16 LAB — URINALYSIS, ROUTINE W REFLEX MICROSCOPIC
Bacteria, UA: NONE SEEN
Bilirubin Urine: NEGATIVE
Glucose, UA: NEGATIVE mg/dL
Hgb urine dipstick: NEGATIVE
Ketones, ur: NEGATIVE mg/dL
Nitrite: NEGATIVE
Protein, ur: NEGATIVE mg/dL
Specific Gravity, Urine: 1.012 (ref 1.005–1.030)
pH: 6 (ref 5.0–8.0)

## 2023-11-16 LAB — CBC
HCT: 34.3 % — ABNORMAL LOW (ref 36.0–46.0)
Hemoglobin: 10.9 g/dL — ABNORMAL LOW (ref 12.0–15.0)
MCH: 29.9 pg (ref 26.0–34.0)
MCHC: 31.8 g/dL (ref 30.0–36.0)
MCV: 94.2 fL (ref 80.0–100.0)
Platelets: 235 K/uL (ref 150–400)
RBC: 3.64 MIL/uL — ABNORMAL LOW (ref 3.87–5.11)
RDW: 12.7 % (ref 11.5–15.5)
WBC: 7.4 K/uL (ref 4.0–10.5)
nRBC: 0 % (ref 0.0–0.2)

## 2023-11-16 LAB — CBG MONITORING, ED: Glucose-Capillary: 161 mg/dL — ABNORMAL HIGH (ref 70–99)

## 2023-11-16 LAB — TSH: TSH: 1.4 u[IU]/mL (ref 0.350–4.500)

## 2023-11-16 MED ORDER — IOHEXOL 300 MG/ML  SOLN
100.0000 mL | Freq: Once | INTRAMUSCULAR | Status: AC | PRN
  Administered 2023-11-16: 100 mL via INTRAVENOUS

## 2023-11-16 MED ORDER — ACETAMINOPHEN 500 MG PO TABS
1000.0000 mg | ORAL_TABLET | Freq: Once | ORAL | Status: AC
  Administered 2023-11-16: 1000 mg via ORAL
  Filled 2023-11-16: qty 2

## 2023-11-16 NOTE — Discharge Instructions (Addendum)
 While you were in the emergency room, you had blood work done that overall was normal.  You did CT scan which did show a small rib fracture that should improve on its own.  Make sure you are taking good deep breaths to help prevent things like pneumonia.  Return to the emergency room for fever, cough or shortness of breath.  Included in your discharge paperwork is the resources for the behavioral health urgent care.  Please call or go there tomorrow.  Return to the emergency room if you have thoughts of harming yourself.

## 2023-11-16 NOTE — ED Triage Notes (Addendum)
 Patient c/o weakness x 3 weeks. Patient report depression and no appetite for past few weeks d/t family issues. Patient report she fell 2 weeks ago and bruised her left upper ribs and left hip. Patient denies N/V. Patient denies dizziness.

## 2023-11-16 NOTE — ED Provider Notes (Signed)
 [place] EMERGENCY DEPARTMENT AT [facility] Provider Note  CSN: [id number] Arrival date & time: 11/16/23 1620  Chief Complaint(s) Weakness and Depression  HPI Miranda Berger is a 68 y.o. female with history of major depression here today for weakness and fatigue of 3 weeks duration.  Patient reportedly also had a fall 2 weeks ago.  She was walking in the kitchen, lost her balance and hit the right side of her chest.  Patient with a behavioral health hospitalization 1 month ago, was started on Wellbutrin  and Risperdal .   Past Medical History Past Medical History:  Diagnosis Date   Abdominal pain 06/22/2012   Anemia    in the past    Anxiety    Aortic stenosis 06/20/2017   Asthma    Chronic interstitial cystitis 06/22/2012   Costochondral chest pain 04/04/2018   Degenerative tear of medial meniscus of right knee 04/21/2016   Depression    Difficult or painful urination 06/22/2012   Dyspareunia 06/22/2012   Family history of adverse reaction to anesthesia    mom had nausea   Female pelvic pain 06/22/2012   GERD (gastroesophageal reflux disease)    Hepatitis    Hepatitis A in 3rd grade?   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   Obstructive sleep apnea    Pericarditis 09/12/2017   PONV (postoperative nausea and vomiting)    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 07/26/2017   [employer], size 23   Urinary urgency 06/22/2012   Patient Active Problem List   Diagnosis Date Noted   MDD (major depressive disorder), recurrent, severe, with psychosis (HCC) 10/14/2023   Onychomycosis 08/16/2023   Depression 12/01/2021   Family history of adverse  reaction to anesthesia 12/01/2021   GERD (gastroesophageal reflux disease) 12/01/2021   Hepatitis 12/01/2021   PONV (postoperative nausea and vomiting) 12/01/2021   History of hypertension 12/01/2021   LBBB (left bundle branch block) 08/08/2017   Obstructive sleep apnea    Anxiety    Asthma    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 07/26/2017   Aortic stenosis 06/20/2017   Degenerative tear of medial meniscus of right knee 04/21/2016   High-tone pelvic floor dysfunction 12/26/2013   Dyspareunia 06/22/2012   Home Medication(s) Prior to Admission medications   Medication Sig Start Date End Date Taking? Authorizing Provider  acetaminophen  (TYLENOL ) 500 MG tablet Take 1,000 mg by mouth every 8 (eight) hours as needed.    [provider]  albuterol  (PROVENTIL  HFA;VENTOLIN  HFA) 108 (90 Base) MCG/ACT inhaler Inhale 2 puffs into the lungs every 6 (six) hours as needed for wheezing or shortness of breath.    [provider]  aspirin  EC 81 MG tablet Take 81 mg by mouth daily.    [provider]  calcium  carbonate (OS-CAL) 600 MG TABS tablet Take 600 mg by mouth daily.     [provider]  cyanocobalamin  (VITAMIN B12) 500 MCG tablet Take 500 mcg by mouth daily.    [provider]  fluticasone  (FLONASE ) 50 MCG/ACT nasal spray Place 1 spray into both nostrils daily as needed for allergies.     [provider]  Fluticasone -Salmeterol (ADVAIR) 250-50 MCG/DOSE AEPB Inhale 1 puff into the lungs  2 (two) times daily.    [provider]  hydrOXYzine  (ATARAX ) 25 MG tablet Take 1 tablet (25 mg total) by mouth every 8 (eight) hours as needed. 10/24/23   Ntuen, Tina C, FNP  levocetirizine (XYZAL) 5 MG tablet Take 5 mg by mouth daily. 09/20/23   [provider]  lisinopril  (ZESTRIL ) 5 MG tablet Take 1 tablet (5 mg total) by mouth daily. 10/25/23   Prentis Kitchens A, DO  mirtazapine  (REMERON ) 7.5 MG tablet Take 1  tablet (7.5 mg total) by mouth at bedtime for 14 days. 10/24/23 11/07/23  Prentis Kitchens A, DO  montelukast  (SINGULAIR ) 10 MG tablet Take 10 mg by mouth daily. 09/20/23   [provider]  Multiple Vitamin (MULTIVITAMIN) capsule Take 1 capsule by mouth daily.    [provider]  pantoprazole  (PROTONIX ) 40 MG tablet Take 1 tablet (40 mg total) by mouth daily for 14 days. 10/25/23 11/08/23  Prentis Kitchens A, DO  propranolol  (INDERAL ) 10 MG tablet Take 1 tablet (10 mg total) by mouth 2 (two) times daily for 14 days. 10/24/23 11/07/23  Prentis Kitchens A, DO  risperiDONE  (RISPERDAL ) 3 MG tablet Take 1 tablet (3 mg total) by mouth 2 (two) times daily. 10/24/23   Prentis Kitchens LABOR, DO  saccharomyces boulardii (FLORASTOR) 250 MG capsule Take 250 mg by mouth daily.    [provider]                                                                                                                                    Past Surgical History Past Surgical History:  Procedure Laterality Date   ABDOMINAL HYSTERECTOMY     AORTIC VALVE REPLACEMENT N/A 07/26/2017   Procedure: MINIMALLY INVASIVE AORTIC VALVE REPLACEMENT (AVR);  Surgeon: Dusty Sudie DEL, MD;  Location: [facility] OR;  Service: Open Heart Surgery;  Laterality: N/A;   BLADDER SURGERY  2011   bone spur removal Right 08/2022   great toe   CARDIAC CATHETERIZATION     CARPAL TUNNEL RELEASE  2012   CHOLECYSTECTOMY     RIGHT/LEFT HEART CATH AND CORONARY ANGIOGRAPHY N/A 07/08/2017   Procedure: RIGHT/LEFT HEART CATH AND CORONARY ANGIOGRAPHY;  Surgeon: Wonda Sharper, MD;  Location: [facility] INVASIVE CV LAB;  Service: Cardiovascular;  Laterality: N/A;   TEE WITHOUT CARDIOVERSION N/A 07/26/2017   Procedure: TRANSESOPHAGEAL ECHOCARDIOGRAM (TEE);  Surgeon: Dusty Sudie DEL, MD;  Location: [facility] OR;  Service: Open Heart Surgery;  Laterality: N/A;   TONSILLECTOMY     Family History Family History  Problem Relation Age of Onset   Stroke Mother    Leukemia Mother     Stroke Father    Sleep apnea Father    Valvular heart disease Brother    Sleep apnea Brother    Heart Problems Maternal Aunt    Valvular heart disease Maternal Aunt    Valvular heart disease Paternal Grandfather     Social History Social  History   Tobacco Use   Smoking status: Never   Smokeless tobacco: Never  Vaping Use   Vaping status: Never Used  Substance Use Topics   Alcohol use: No    Alcohol/week: 0.0 standard drinks of alcohol   Drug use: No   Allergies Codeine  and Prednisone   Review of Systems Review of Systems  Physical Exam Vital Signs  I have reviewed the triage vital signs BP (!) 140/87 (BP Location: Right Arm)   Pulse 85   Temp 97.9 F (36.6 C) (Axillary)   Resp 18   SpO2 99%   Physical Exam Vitals and nursing note reviewed.  Constitutional:      Appearance: She is not toxic-appearing.  HENT:     Head: Normocephalic and atraumatic.  Eyes:     Pupils: Pupils are equal, round, and reactive to light.  Cardiovascular:     Rate and Rhythm: Normal rate.  Pulmonary:     Effort: Pulmonary effort is normal.  Abdominal:     General: Abdomen is flat.     Palpations: Abdomen is soft.  Musculoskeletal:     Cervical back: Normal range of motion.  Skin:    General: Skin is warm.  Neurological:     General: No focal deficit present.     Mental Status: She is alert.     ED Results and Treatments Labs (all labs ordered are listed, but only abnormal results are displayed) Labs Reviewed  COMPREHENSIVE METABOLIC PANEL WITH GFR - Abnormal; Notable for the following components:      Result Value   Glucose, Bld 159 (*)    All other components within normal limits  CBC - Abnormal; Notable for the following components:   RBC 3.64 (*)    Hemoglobin 10.9 (*)    HCT 34.3 (*)    All other components within normal limits  URINALYSIS, ROUTINE W REFLEX MICROSCOPIC - Abnormal; Notable for the following components:   Leukocytes,Ua TRACE (*)    All other  components within normal limits  CBG MONITORING, ED - Abnormal; Notable for the following components:   Glucose-Capillary 161 (*)    All other components within normal limits  TSH  CBG MONITORING, ED                                                                                                                          Radiology CT Head Wo Contrast Result Date: 11/16/2023 EXAM: CT HEAD WITHOUT CONTRAST 11/16/2023 08:55:10 PM TECHNIQUE: CT of the head was performed without the administration of intravenous contrast. Automated exposure control, iterative reconstruction, and/or weight based adjustment of the mA/kV was utilized to reduce the radiation dose to as low as reasonably achievable. COMPARISON: 05/20/2021 CLINICAL HISTORY: Ataxia, head trauma. Patient c/o weakness x 3 weeks. Patient report depression and no appetite for past few weeks d/t family issues. Patient report she fell 2 weeks ago and bruised her left upper ribs and left hip. Patient denies N/V. Patient denies  dizziness. FINDINGS: BRAIN AND VENTRICLES: Subcortical and periventricular small vessel ischemic changes. No acute hemorrhage. No evidence of acute infarct. No hydrocephalus. No extra-axial collection. No mass effect or midline shift. ORBITS: No acute abnormality. SINUSES: No acute abnormality. SOFT TISSUES AND SKULL: No acute soft tissue abnormality. No skull fracture. IMPRESSION: 1. No acute intracranial abnormality. Electronically signed by: Pinkie Pebbles MD 11/16/2023 09:09 PM EDT RP Workstation: [id number]   CT CHEST ABDOMEN PELVIS W CONTRAST Result Date: 11/16/2023 CLINICAL DATA:  Polytrauma.  Weakness.  Fall. EXAM: CT CHEST, ABDOMEN, AND PELVIS WITH CONTRAST TECHNIQUE: Multidetector CT imaging of the chest, abdomen and pelvis was performed following the standard protocol during bolus administration of intravenous contrast. RADIATION DOSE REDUCTION: This exam was performed according to the departmental dose-optimization  program which includes automated exposure control, adjustment of the mA and/or kV according to patient size and/or use of iterative reconstruction technique. CONTRAST:  OMNIPAQUE  IOHEXOL  300 MG/ML  SOLN COMPARISON:  CT angiogram chest 09/01/2017. CTA chest abdomen and pelvis 07/20/2017. FINDINGS: CT CHEST FINDINGS Cardiovascular: Heart is mildly enlarged. Aortic valve replacement present. There is no pericardial effusion. Aorta is normal in size. There are atherosclerotic calcifications of the aorta. Mediastinum/Nodes: No enlarged mediastinal, hilar, or axillary lymph nodes. Thyroid  gland, trachea, and esophagus demonstrate no significant findings. Lungs/Pleura: There is atelectasis in the bilateral lower lobes, left greater than right. There some stable peripheral scarring in the inferior right upper lobe and right middle lobe. There is a trace left pleural effusion. There is no pneumothorax. Musculoskeletal: Cervical spinal fusion plate is present. Sternotomy fixation device present. There is an acute nondisplaced posterior left ninth rib fracture. Left CT ABDOMEN PELVIS FINDINGS Hepatobiliary: No hepatic injury or perihepatic hematoma. Gallbladder is surgically absent. Pancreas: Unremarkable. No pancreatic ductal dilatation or surrounding inflammatory changes. Spleen: No splenic injury or perisplenic hematoma. Adrenals/Urinary Tract: No adrenal hemorrhage or renal injury identified. Bladder is unremarkable. There is a subcentimeter hypodensity in the left kidney which is too small to characterize, likely a cyst. Stomach/Bowel: Stomach is within normal limits. Appendix appears normal. No evidence of bowel wall thickening, distention, or inflammatory changes. Vascular/Lymphatic: Aortic atherosclerosis. No enlarged abdominal or pelvic lymph nodes. Reproductive: Status post hysterectomy. No adnexal masses. Other: No abdominal wall hernia or abnormality. No abdominopelvic ascites. Musculoskeletal: No acute  fractures are seen. IMPRESSION: 1. Acute nondisplaced posterior left ninth rib fracture. 2. Trace left pleural effusion. 3. No acute posttraumatic sequelae in the abdomen or pelvis. 4. Aortic atherosclerosis. Aortic Atherosclerosis (ICD10-I70.0). Electronically Signed   By: Greig Pique M.D.   On: 11/16/2023 21:06   DG Chest 2 View Result Date: 11/16/2023 CLINICAL DATA:  Left rib pain. Weakness for 3 weeks. Fell 2 weeks ago with bruising to the left upper ribs. EXAM: CHEST - 2 VIEW COMPARISON:  03/09/2023 FINDINGS: Heart size and pulmonary vascularity are normal. Cardiac valve prosthesis. Postoperative changes in the cervical spine and anterior chest. Surgical clips in the right upper quadrant. Lungs are clear. No pleural effusion or pneumothorax. Mediastinal contours appear intact. Visualized ribs are nondisplaced. IMPRESSION: No active cardiopulmonary disease. Electronically Signed   By: Elsie Gravely M.D.   On: 11/16/2023 17:33    Pertinent labs & imaging results that were available during my care of the patient were reviewed by me and considered in my medical decision making (see MDM for details).  Medications Ordered in ED Medications  acetaminophen  (TYLENOL ) tablet 1,000 mg (1,000 mg Oral Given 11/16/23 1913)  iohexol  (OMNIPAQUE ) 300 MG/ML solution 100  mL (100 mLs Intravenous Contrast Given 11/16/23 2039)                                                                                                                                     Procedures Procedures  (including critical care time)  Medical Decision Making / ED Course   This patient presents to the ED for concern of weakness, this involves an extensive number of treatment options, and is a complaint that carries with it a high risk of complications and morbidity.  The differential diagnosis includes traumatic injury, thyroid  dysfunction, less likely underlying infection, ICH, major depression.  MDM: Ultimately, the patient's  symptoms are related to her major depression.  Had a recent hospitalization for this with some medication changes.  These could be contributing to her symptoms.  Will try to evaluate for underlying medical cause.  Will obtain imaging of the patient's head as well as chest abdomen pelvis given her recent fall.  Hemoglobin is a bit lower than it was recently, however this is within the range of previous values.  Patient's husband at bedside help provide additional history.  Reassessment 9:55 PM-patient's CT imaging shows a nondisplaced ninth rib fracture.  Otherwise negative.  Blood work otherwise normal.  Her TSH is negative.  CT head per my independent review shows no intracranial hemorrhage.  I had a lengthy discussion with the patient the patient's husband at bedside.  I discussed different options for continued treatment of the patient's depression.  I offered admission and being referred to TTS versus discharge with having them go to behavioral health urgent care tomorrow morning.  They preferred behavioral health urgent care.  I offered this because I do not believe the patient is an imminent threat to herself or others this evening.  Do not believe she is appropriate for an IVC.  She is strong social support, and a family that lives with her and cares deeply for her.  I believe she would benefit more from outpatient workup.    Additional history obtained: -Additional history obtained from  -External records from outside source obtained and reviewed including: Chart review including previous notes, labs, imaging, consultation notes   Lab Tests: -I ordered, reviewed, and interpreted labs.   The pertinent results include:   Labs Reviewed  COMPREHENSIVE METABOLIC PANEL WITH GFR - Abnormal; Notable for the following components:      Result Value   Glucose, Bld 159 (*)    All other components within normal limits  CBC - Abnormal; Notable for the following components:   RBC 3.64 (*)     Hemoglobin 10.9 (*)    HCT 34.3 (*)    All other components within normal limits  URINALYSIS, ROUTINE W REFLEX MICROSCOPIC - Abnormal; Notable for the following components:   Leukocytes,Ua TRACE (*)    All other components within normal limits  CBG MONITORING, ED - Abnormal; Notable for the following components:  Glucose-Capillary 161 (*)    All other components within normal limits  TSH  CBG MONITORING, ED      EKG my independent review of the patient's EKG shows no ST segment depressions or elevations, no T wave inversions, no evidence of acute ischemia.  EKG Interpretation Date/Time:  Wednesday November 16 2023 16:29:52 EDT Ventricular Rate:  97 PR Interval:  156 QRS Duration:  87 QT Interval:  355 QTC Calculation: 451 R Axis:   79  Text Interpretation: Sinus rhythm Probable left atrial enlargement Confirmed by Mannie Pac [phone]) on 11/16/2023 9:59:07 PM         Imaging Studies ordered: I ordered imaging studies including CT imaging of chest on 1 pelvis I independently visualized and interpreted imaging. I agree with the radiologist interpretation   Medicines ordered and prescription drug management: Meds ordered this encounter  Medications   acetaminophen  (TYLENOL ) tablet 1,000 mg   iohexol  (OMNIPAQUE ) 300 MG/ML solution 100 mL    -I have reviewed the patients home medicines and have made adjustments as needed  Cardiac Monitoring: The patient was maintained on a cardiac monitor.  I personally viewed and interpreted the cardiac monitored which showed an underlying rhythm of: Normal sinus rhythm  Social Determinants of Health:  Factors impacting patients care include: Lack of access to primary care   Reevaluation: After the interventions noted above, I reevaluated the patient and found that they have :improved  Co morbidities that complicate the patient evaluation  Past Medical History:  Diagnosis Date   Abdominal pain 06/22/2012   Anemia    in the  past    Anxiety    Aortic stenosis 06/20/2017   Asthma    Chronic interstitial cystitis 06/22/2012   Costochondral chest pain 04/04/2018   Degenerative tear of medial meniscus of right knee 04/21/2016   Depression    Difficult or painful urination 06/22/2012   Dyspareunia 06/22/2012   Family history of adverse reaction to anesthesia    mom had nausea   Female pelvic pain 06/22/2012   GERD (gastroesophageal reflux disease)    Hepatitis    Hepatitis A in 3rd grade?   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     Final Clinical Impression(s) / ED Diagnoses Final diagnoses:  Depression, unspecified depression type     @PCDICTATION @    Mannie Pac T, DO 11/16/23 2203

## 2023-11-17 NOTE — TOC CM/SW Note (Signed)
 Pt's husband called earlier today asking about dc instructions/prescriptions/referrals from pt's visit yesterday at the [facility] ED. I reviewed the chart and did not find any prescriptions or referrals. Miranda Berger stated that he has taken his wife to two [place] facilities today and neither of them knew why he had brought his wife there. Per provider notes from yesterday's visit pt is to follow up on her own, no referral needed, c/her regular provider or behavioral health urgent care to discuss recent changes to medications as a possible source of symptoms. This was relayed to pt's husband by phone.

## 2023-11-18 ENCOUNTER — Ambulatory Visit (HOSPITAL_COMMUNITY)
Admission: EM | Admit: 2023-11-18 | Discharge: 2023-11-18 | Disposition: A | Discharge status: Home / Self Care | Attending: Psychiatry | Admitting: Psychiatry

## 2023-11-18 DIAGNOSIS — Z79899 Other long term (current) drug therapy: Secondary | ICD-10-CM | POA: Diagnosis not present

## 2023-11-18 DIAGNOSIS — Z952 Presence of prosthetic heart valve: Secondary | ICD-10-CM | POA: Insufficient documentation

## 2023-11-18 DIAGNOSIS — I350 Nonrheumatic aortic (valve) stenosis: Secondary | ICD-10-CM | POA: Diagnosis not present

## 2023-11-18 DIAGNOSIS — F411 Generalized anxiety disorder: Secondary | ICD-10-CM | POA: Diagnosis not present

## 2023-11-18 DIAGNOSIS — K219 Gastro-esophageal reflux disease without esophagitis: Secondary | ICD-10-CM | POA: Diagnosis not present

## 2023-11-18 DIAGNOSIS — N3020 Other chronic cystitis without hematuria: Secondary | ICD-10-CM | POA: Diagnosis not present

## 2023-11-18 DIAGNOSIS — F330 Major depressive disorder, recurrent, mild: Secondary | ICD-10-CM | POA: Diagnosis not present

## 2023-11-18 MED ORDER — HYDROXYZINE HCL 25 MG PO TABS: 25.0000 mg | ORAL_TABLET | Freq: Three times a day (TID) | ORAL | 0 refills | Status: DC | PRN

## 2023-11-18 MED ORDER — BUPROPION HCL ER (XL) 300 MG PO TB24: 300.0000 mg | ORAL_TABLET | Freq: Every day | ORAL | 0 refills | Status: DC

## 2023-11-18 MED ORDER — RISPERIDONE 1 MG PO TABS: 1.0000 mg | ORAL_TABLET | Freq: Two times a day (BID) | ORAL | 0 refills | Status: DC

## 2023-11-18 MED ORDER — MIRTAZAPINE 7.5 MG PO TABS: 7.5000 mg | ORAL_TABLET | Freq: Every day | ORAL | 0 refills | Status: DC

## 2023-11-18 NOTE — Progress Notes (Signed)
   11/18/23 0715  [facility] Triage Screening (Walk-ins at [facility] only)  How Did You Hear About Us ? Family/Friend  What Is the Reason for Your Visit/Call Today? Miranda Berger is a 68 year old female presenting to [facility] accompanied by her husband. Pt states that she has not have her medication (Mirtazapine ). Pt states she has been off her medication for 2-3 weeks. Pt states she is looking for medication refill at this time of triage. Pt states she has been having worsening side effects from being off her medication. Pt states that she recently had a fall last week. Pts appearance is neat, motoer activity is slowed, affect is constricted, eye contact is normal, and speech is normal. Pt denies substance use, Si, HI and AVH.  How Long Has This Been Causing You Problems? 1 wk - 1 month  Have You Recently Had Any Thoughts About Hurting Yourself? No  Are You Planning to Commit Suicide/Harm Yourself At This time? No  Have you Recently Had Thoughts About Hurting Someone Sherral? No  Are You Planning To Harm Someone At This Time? No  Physical Abuse Denies  Verbal Abuse Denies  Sexual Abuse Denies  Exploitation of patient/patient's resources Denies  Self-Neglect Denies  Possible abuse reported to: Other (Comment)  Are you currently experiencing any auditory, visual or other hallucinations? No  Have You Used Any Alcohol or Drugs in the Past 24 Hours? No  Do you have any current medical co-morbidities that require immediate attention? No  Clinician description of patient physical appearance/behavior: soft spoken, confused  What Do You Feel Would Help You the Most Today? Medication(s)  If access to [facility] Urgent [facility] was not available, would you have sought care in the Emergency Department? No  Determination of Need Urgent (48 hours)  Options For Referral Medication Management  Determination of Need filed? Yes

## 2023-11-18 NOTE — Discharge Instructions (Addendum)
 Discharge recommendations:   Medications: Patient is to take medications as prescribed. The patient or patient's guardian is to contact a medical professional and/or outpatient provider to address any new side effects that develop. The patient or the patient's guardian should update outpatient providers of any new medications and/or medication changes.    Outpatient Follow up: You have an appointment with [facility] on 12/22/23. Please review list of outpatient resources for psychiatry and counseling. Please follow up with your primary care provider for all medical related needs.    Therapy: We recommend that patient participate in individual therapy to address mental health concerns.    Safety:   The following safety precautions should be taken:   No sharp objects. This includes scissors, razors, scrapers, and putty knives.   Chemicals should be removed and locked up.   Medications should be removed and locked up.   Weapons should be removed and locked up. This includes firearms, knives and instruments that can be used to cause injury.   The patient should abstain from use of illicit substances/drugs and abuse of any medications.  If symptoms worsen or do not continue to improve or if the patient becomes actively suicidal or homicidal then it is recommended that the patient return to the closest hospital emergency department, the [facility], or call 911 for further evaluation and treatment. National Suicide Prevention Lifeline 1-800-SUICIDE or 716 867 2307.  About 988 988 offers 24/7 access to trained crisis counselors who can help people experiencing mental health-related distress. People can call or text 988 or chat 988lifeline.org for themselves or if they are worried about a loved one who may need crisis support.

## 2023-11-18 NOTE — ED Provider Notes (Signed)
 Behavioral Health Urgent Care Medical Screening Exam  Patient Name: Miranda Berger MRN: [id number] Date of Evaluation: 11/18/23 Chief Complaint: Need medications refilled.  Diagnosis:  Final diagnoses:  Mild episode of recurrent major depressive disorder  Medication management    History of Present illness: Miranda Berger 68 y.o., female patient presented to [facility] as a voluntary walk in accompanied by her husband with complaints of recent [facility] discharge but did not have enough medications to bridge her until her outpatient follow up [facility] on 12/22/2023.  Miranda Berger, is seen face to face by this provider  and chart reviewed on 11/18/23.  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  Patient was discharged from [facility] on 10/24/2023 with a 14-day supply of medications, however her next outpatient follow-up visit is not until 12/22/2023.  Patient was discharged on hydroxyzine  25 mg as needed 3 times daily for anxiety, mirtazapine  7.5 mg nightly, propranolol  10 mg twice daily for anxiety, risperidone  3 mg twice daily and Wellbutrin  XR 300 mg daily.  Patient does have an extensive medical history significant for GERD, chronic cystitis, aortic stenosis and required an aortic valve replacement in 2019.  On evaluation Miranda Berger reports that she ran out of medications a few days ago and is needing a refill.  She reports taking Remeron , Risperdal , Wellbutrin  and Vistaril  as needed.  She has not been taking the propranolol  as it has caused dizziness and she had a recent fall a couple of days ago. Pt denies current suicidal ideations, homicidal ideations and psychotic symptoms. She reports that she has been compliant with her medications most of the time but has forgotten to take them a couple of times. She reports that since being off of her medications for a few days, she is now experiencing shuffled gait and  mild generalized stiffness, which she and husband report were present upon discharge from hospital. We discussed possible side effects from antipsychotic medications and pt may be starting to show signs of drug induced movement disorder. Pt states that she was placed on Risperdal  due to the hallucinations that she was experiencing prior to hospitalization and that it has been effective in treating her hallucinations.  She does wish to continue it and agreed to a lower dose of Risperdal .  Patient reports that her sleep has been good but has had a decreased appetite due to not having her medications.  Her husband has agreed to help her with medication management (reminding her to take her medications morning and night, while daughter-in-law will fill her pill organizer every week).  We discussed and wrote down her mental health medication schedule thoroughly so that family could help her manage her medications and improve medication compliance.   During evaluation Miranda Berger is sitting in waiting area, in no acute distress. She is alert & oriented x 4, calm, cooperative and attentive for this assessment.  Her mood is somewhat anxious with congruent affect. She has normal speech, and behavior.  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  Objectively there is no evidence of psychosis/mania or delusional thinking. Pt does not appear to be responding to internal or external stimuli.  Patient is able to converse coherently, goal directed thoughts, no distractibility, or pre-occupation. She also denies current suicidal/self-harm/homicidal ideation, psychosis, and paranoia.  Patient assessment answered questions appropriately.  Flowsheet Row ED from 11/18/2023 in [facility] ED from 11/16/2023 in [facility] Emergency Department at [facility] Admission (Discharged) from  [facility]  C-SSRS RISK CATEGORY No Risk No Risk No Risk    Psychiatric Specialty Exam  Presentation  General Appearance:Appropriate for Environment  Eye Contact:Good  Speech:Clear and Coherent  Speech Volume:Normal  Handedness:Right   Mood and Affect  Mood: Anxious  Affect: Appropriate   Thought Process  Thought Processes: Coherent; Linear  Descriptions of Associations:Intact  Orientation:Full (Time, Place and Person)  Thought Content:WDL  Diagnosis of Schizophrenia or Schizoaffective disorder in past: No   Hallucinations:None N/A N/A  Ideas of Reference:None  Suicidal Thoughts:No -- (Denies)  Homicidal Thoughts:No   Sensorium  Memory: Immediate Good; Recent Good  Judgment: Good  Insight: Good   Executive Functions  Concentration: Good  Attention Span: Good  Recall: Good  Fund of Knowledge: Good  Language: Good   Psychomotor Activity  Psychomotor Activity: Tremor; Shuffling Gait   Assets  Assets: Desire for Improvement; Housing; Physical Health; Social Support; Manufacturing systems engineer; Intimacy; Resilience   Sleep  Sleep: Good  Number of hours:  8   Physical Exam: Physical Exam Vitals and nursing note reviewed.  Constitutional:      Appearance: Normal appearance.  HENT:     Head: Normocephalic.     Nose: Nose normal.  Eyes:     Extraocular Movements: Extraocular movements intact.  Cardiovascular:     Rate and Rhythm: Normal rate.  Pulmonary:     Effort: Pulmonary effort is normal.  Musculoskeletal:        General: Normal range of motion.     Cervical back: Normal range of motion.  Skin:    Findings: Bruising present.     Comments: Bruising to abdomen from recent fall  Neurological:     General: No focal deficit present.     Mental Status: She is alert and oriented to person, place, and time.     Gait: Gait abnormal.  Psychiatric:        Mood and Affect:  Mood normal.        Behavior: Behavior normal.        Thought Content: Thought content normal.        Judgment: Judgment normal.    Review of Systems  Constitutional: Negative.   HENT: Negative.    Eyes: Negative.   Respiratory: Negative.    Cardiovascular: Negative.   Gastrointestinal: Negative.   Genitourinary: Negative.   Musculoskeletal:  Positive for falls.       Fall within this past week  Neurological:  Positive for tremors.  Endo/Heme/Allergies: Negative.   Psychiatric/Behavioral:  The patient is nervous/anxious.    Blood pressure 115/78, pulse 80, temperature 98.6 F (37 C), temperature source Oral, resp. rate 20, SpO2 99%. There is no height or weight on file to calculate BMI.  Musculoskeletal: Strength & Muscle Tone: within normal limits Gait & Station: shuffle Patient leans: N/A   [facility] MSE Discharge Disposition for Follow up and Recommendations: Based on my evaluation the patient does not appear to have an emergency medical condition and can be discharged with resources and follow up care in outpatient services for Medication Management and Individual Therapy Pt discharged home with husband. Pt able to contract for safety and husband denies any immediate safety concerns with pt returning home. Pt and husband feel much better after thoroughly discussing and planning medication management. They are provided  with supply of medications sent to pharmacy,that pt was recently discharged on including Wellbutrin  300mg  daily, Mirtazapine  7.5mg  nightly, and Hydroxyzine  25mg  TID PRN for anxiety. Since pt was showing early signs of possible drug induced movement concerns, changed Risperdal  from 3mg  BID to Risperdal  1mg  BID. She has a confirmed appointment at [facility] on 12/22/23 but will call for an earlier appointment. Encouraged to return if symptoms worsen or if unable to keep self and other safe.   Alan JAYSON Mcardle, NP 11/18/2023, 9:06 AM

## 2023-11-18 NOTE — ED Notes (Signed)
 Discharged by provider

## 2023-11-24 ENCOUNTER — Ambulatory Visit (HOSPITAL_COMMUNITY): Admission: EM | Admit: 2023-11-24 | Discharge: 2023-11-24 | Disposition: A | Discharge status: Home / Self Care

## 2023-11-24 DIAGNOSIS — Z91128 Patient's intentional underdosing of medication regimen for other reason: Secondary | ICD-10-CM | POA: Insufficient documentation

## 2023-11-24 DIAGNOSIS — T43595A Adverse effect of other antipsychotics and neuroleptics, initial encounter: Secondary | ICD-10-CM | POA: Insufficient documentation

## 2023-11-24 DIAGNOSIS — T43026A Underdosing of tetracyclic antidepressants, initial encounter: Secondary | ICD-10-CM | POA: Diagnosis not present

## 2023-11-24 DIAGNOSIS — G2571 Drug induced akathisia: Secondary | ICD-10-CM | POA: Diagnosis not present

## 2023-11-24 DIAGNOSIS — Z79899 Other long term (current) drug therapy: Secondary | ICD-10-CM | POA: Diagnosis not present

## 2023-11-24 DIAGNOSIS — F339 Major depressive disorder, recurrent, unspecified: Secondary | ICD-10-CM | POA: Diagnosis not present

## 2023-11-24 DIAGNOSIS — Z760 Encounter for issue of repeat prescription: Secondary | ICD-10-CM | POA: Insufficient documentation

## 2023-11-24 MED ORDER — MIRTAZAPINE 15 MG PO TABS: 15.0000 mg | ORAL_TABLET | Freq: Every day | ORAL | 0 refills | Status: DC

## 2023-11-24 MED ORDER — PROPRANOLOL HCL 10 MG PO TABS: 5.0000 mg | ORAL_TABLET | Freq: Two times a day (BID) | ORAL | 0 refills | Status: DC

## 2023-11-24 MED ORDER — HYDROXYZINE HCL 25 MG PO TABS: 25.0000 mg | ORAL_TABLET | Freq: Three times a day (TID) | ORAL | 0 refills | Status: DC | PRN

## 2023-11-24 MED ORDER — BUPROPION HCL ER (XL) 300 MG PO TB24: 300.0000 mg | ORAL_TABLET | Freq: Every day | ORAL | 0 refills | Status: DC

## 2023-11-24 NOTE — ED Provider Notes (Addendum)
 Behavioral Health Urgent Care Medical Screening Exam  Patient Name: Miranda Berger MRN: [id number] Date of Evaluation: 11/24/23  Chief Complaint: Per Triage, Miranda Berger is a 68 year old female presenting to [facility] accompanied by her husband. Pt states that she has not have her medication (Mirtazapine ). Pt states she has been off her medication for 2-3 weeks. Pt states she is looking for medication refill at this time of triage. Pt states she has been having worsening side effects from being off her medication. Pt states that she recently had a fall last week. Pts appearance is neat, motoer activity is slowed, affect is constricted, eye contact is normal, and speech is normal. Pt denies substance use, Si, HI and AVH.  Diagnosis:  Final diagnoses:  Episode of recurrent major depressive disorder, unspecified depression episode severity  Drug induced akathisia  Medication management    History of Present illness: Miranda Berger is a 68 y.o. female patient with a past psychiatric history significant for anxiety and MDD with psychotic features and medical history of GERD, s/p minimally invasive aortic valve replacement, asthma, sleep apnea and hypertension who presented to the [facility] voluntary accompanied by her husband Genelda Roark.   Per chart review, [facility] on 11/18/23:Patient presented with complaints of recent [facility] discharge but did not have enough medications to bridge her until her outpatient follow up [facility] on 12/22/2023. Patient was discharged from [facility] on 10/24/2023 with a 14-day supply of medications, however her next outpatient follow-up visit is not until 12/22/2023. Patient was discharged on hydroxyzine  25 mg as needed 3 times daily for anxiety, mirtazapine  7.5 mg nightly, propranolol  10 mg twice daily for anxiety, risperidone  3 mg twice daily and Wellbutrin  XR 300 mg daily. Plan of care at the time of discharge, per Alan Mcardle, NP., continue Wellbutrin  300mg  daily, Mirtazapine  7.5mg  nightly, and Hydroxyzine  25mg  TID PRN for anxiety. Since pt was showing early signs of possible drug induced movement concerns, changed Risperdal  from 3mg  BID to Risperdal  1mg  BID. She has a confirmed appointment at [facility] on 12/22/23 but will call for an earlier appointment. Encouraged to return if symptoms worsen or if unable to keep self and other safe.   On evaluation, patient states that she used to take alprazolam  for sleep and it worked perfectly. She states that over the past two days she has not been able to sleep and that last night she only got 3 hours and 45 minutes of sleep. She states that she has not been napping and that she has been feeling like she has a lot of energy and has been overthinking and working on several task in her home. She states that it is hard for her to sit still and that she is always restless, pacing and frustrated. She states that she has been having the stated symptoms since she left the behavioral health urgent care on 9/26. She reports medication compliance with taking the Wellbutrin  300 mg daily, risperidone  1 mg BID, mirtazapine  7.5 mg QHS and hydroxyzine  as needed. She had previously stopped taking the propranolol  10 mg twice daily due to feeling dizzy and recent fall 2 weeks prior to her visit here at the behavioral health urgent care on 9/26. She states that her daughter-in-law manages her medications.  She denies a history of bipolar or hypomania/mania. Although, she reports an increase in energy, she denies talking fast, risky behaviors and reports feeling tired after not  being able to get adequate sleep at night. She describes her mood as feeling a little depressed, mostly feeling stuck and not making significant progress.  She continues to report a decreased appetite. She denies suicidal thoughts. She denies homicidal thoughts. She denies active auditory or visual  hallucinations. She reports intermittently seeing something move if she turns her head quickly for the past couple days. Objectively, no signs of acute psychosis, delusions or paranoia on exam.   She resides with her husband who she identifies as supportive. She reports occasional alcohol use, mostly drinking wine. She denies using illicit drugs. She is established with [facility] for outpatient counseling and has a new patient appointment for medication management on December 22, 2023. She states that she has attempted to call to reschedule her appointment to a sooner date as previously discussed with nurse practitioner at her last visit ([facility]) on 9/26, however she has not been able to reschedule appointment as no one has returned her call from [facility].   Plan of care: I discussed with the patient stopping the Risperdal  as her symptoms appear to be consistent with drug-induced akathisia from taking antipsychotic medication Risperdal .Patient to resume taking propranolol  for akathisia and will decrease to 5 mg twice daily to recent reports of dizziness/fall. Patient educated on fall risk, and checking blood pressure/heart rate with taking propranolol . Will increase mirtazapine  to 15 mg at bedtime to help with sleep, depressive symptoms and appetite. Patient to continue taking Wellbutrin  300 mg daily and hydroxyzine  25 mg 3 times daily as needed for anxiety. Will refill Wellbutrin  300 mg and hydroxyzine  25 mg x 7 days as patient medications will run out on October 26  Will provide patient with a new prescription for propranolol  5 mg twice daily and mirtazapine  15 mg x 30 days. I also discussed with the patient that if she is unable to fill new prescriptions for propranolol  that she may break the 10 mg tablets that she currently has in half and take 5 mg twice daily and that she may take 2 of the 7.5 mg tablets of mirtazapine  to make 15 mg. I also offered to have the patient's daughter-in-law contact  this provider if she has any questions about medication adjustments. I also contacted [facility] with the patient's consent to try to reschedule her medication appointment to a sooner date, however the phone went directly to voicemail. This provider left a HIPAA compliant voicemail. However, no return call as of 1600. Also provided patient with additional resources for providers to set up Medicare for medication management. Safety planning completed prior to discharge. Patient was escorted back to the lobby with her husband and safely discharged.    Flowsheet Row ED from 11/24/2023 in [facility] ED from 11/18/2023 in [facility] ED from 11/16/2023 in [facility] Emergency Department at [facility]  C-SSRS RISK CATEGORY No Risk No Risk No Risk    Psychiatric Specialty Exam  Presentation  General Appearance:Appropriate for Environment  Eye Contact:Fair  Speech:Clear and Coherent  Speech Volume:Normal  Handedness:Right   Mood and Affect  Mood: Dysphoric  Affect: Congruent   Thought Process  Thought Processes: Coherent  Descriptions of Associations:Intact  Orientation:Full (Time, Place and Person)  Thought Content:Logical   Diagnosis of Schizophrenia or Schizoaffective disorder in past: No   Hallucinations:None  Ideas of Reference:None  Suicidal Thoughts:No  Homicidal Thoughts:No   Sensorium  Memory: Immediate Fair; Recent Fair; Remote Fair  Judgment: Intact  Insight:  Present   Executive Functions  Concentration: Fair  Attention Span: Fair  Recall: [employer] of Knowledge: Fair  Language: Fair   Psychomotor Activity  Psychomotor Activity: Normal; Restlessness   Assets  Assets: Manufacturing systems engineer; Desire for Improvement; Leisure Time; Physical Health; Social Support; Resilience; Financial Resources/Insurance; Housing   Sleep  Sleep: Fair  Number of hours:   3.45   Physical Exam: Physical Exam Cardiovascular:     Rate and Rhythm: Normal rate.  Pulmonary:     Effort: Pulmonary effort is normal.  Musculoskeletal:        General: Normal range of motion.  Neurological:     Mental Status: She is alert and oriented to person, place, and time.    Review of Systems  Constitutional: Negative.   HENT: Negative.    Eyes: Negative.   Respiratory: Negative.    Cardiovascular: Negative.   Gastrointestinal: Negative.   Genitourinary: Negative.   Musculoskeletal: Negative.   Neurological: Negative.   Endo/Heme/Allergies: Negative.    Blood pressure 126/85, pulse 88, temperature 98.4 F (36.9 C), temperature source Oral, resp. rate 16, SpO2 97%. There is no height or weight on file to calculate BMI.  Musculoskeletal: Strength & Muscle Tone: within normal limits Gait & Station: normal Patient leans: N/A   [facility] MSE Discharge Disposition for Follow up and Recommendations: Based on my evaluation the patient does not appear to have an emergency medical condition and can be discharged with resources and follow up care in outpatient services for Medication Management and Individual Therapy   Discharge recommendations:   Medications: Patient is to take medications as prescribed. The patient or patient's guardian is to contact a medical professional and/or outpatient provider to address any new side effects that develop. The patient or the patient's guardian should update outpatient providers of any new medications and/or medication changes.   Outpatient Follow up: Please review list of outpatient resources for psychiatry and counseling. Please follow up with your primary care provider for all medical related needs.   Follow up with [facility] Thursday Dec 22, 2023 You have an appointment 12/22/23 at 9:30 am for medication management services, Virtual telehealth appt. You are on a  cancellation list for a sooner appointment [address] [place] [address] [phone]  Therapy: We recommend that patient participate in individual therapy to address mental health concerns.  Safety:   The following safety precautions should be taken:   No sharp objects. This includes scissors, razors, scrapers, and putty knives.   Chemicals should be removed and locked up.   Medications should be removed and locked up.   Weapons should be removed and locked up. This includes firearms, knives and instruments that can be used to cause injury.   The patient should abstain from use of illicit substances/drugs and abuse of any medications.  If symptoms worsen or do not continue to improve or if the patient becomes actively suicidal or homicidal then it is recommended that the patient return to the closest hospital emergency department, the [facility], or call 911 for further evaluation and treatment. National Suicide Prevention Lifeline 1-800-SUICIDE or (704)593-5253.  About 988 988 offers 24/7 access to trained crisis counselors who can help people experiencing mental health-related distress. People can call or text 988 or chat 988lifeline.org for themselves or if they are worried about a loved one who may need crisis support.   Outpatient Therapy and Psychiatry for Medicare Recipients  [facility] [address] [place], [place], [address] [phone] phone  [facility] [address] [place], [place], [address] [address] phone ([address], [employer] [address] - [place], [address], [place], [place], [employer], [address], [employer] [place], [place], [address], [place], [place], [place], Optum, T[employer]are, U[employer], [employer], [place])  [facility] [address] [place], [place], [address] [phone] phone  [facility] [address] [place], [place], [address] [phone] phone  [facility] [address] [place], [place], [address] [phone] phone [phone] fax  [facility], [place] [address] [place], [place], [address] [phone] phone  [facility] [address] [place], [place], [address] [phone] phone [phone] fax  [facility] [address] [place], [place], [address] [phone] phone  Janit Griffins [address] [place], [place], [address] [phone] phone  [facility] [address] [place], [place], [address] [phone] phone [phone] fax   Teresa Wyline CROME, NP 11/24/2023, 10:48 AM

## 2023-11-24 NOTE — Discharge Summary (Signed)
 Miranda Berger to be discharged Home per NP order. An After Visit Summary was printed and given to the patient by provider. Patient escorted out and discharged home via private auto.  [name]  11/24/2023 10:31 AM

## 2023-11-24 NOTE — Discharge Instructions (Addendum)
 Discharge recommendations:   Medications: Patient is to take medications as prescribed. The patient or patient's guardian is to contact a medical professional and/or outpatient provider to address any new side effects that develop. The patient or the patient's guardian should update outpatient providers of any new medications and/or medication changes.   Outpatient Follow up: Please review list of outpatient resources for psychiatry and counseling. Please follow up with your primary care provider for all medical related needs.   Follow up with [facility] Thursday Dec 22, 2023 You have an appointment 12/22/23 at 9:30 am for medication management services, Virtual telehealth appt. You are on a cancellation list for a sooner appointment [address] [place] [address] [phone]  Therapy: We recommend that patient participate in individual therapy to address mental health concerns.  Safety:   The following safety precautions should be taken:   No sharp objects. This includes scissors, razors, scrapers, and putty knives.   Chemicals should be removed and locked up.   Medications should be removed and locked up.   Weapons should be removed and locked up. This includes firearms, knives and instruments that can be used to cause injury.   The patient should abstain from use of illicit substances/drugs and abuse of any medications.  If symptoms worsen or do not continue to improve or if the patient becomes actively suicidal or homicidal then it is recommended that the patient return to the closest hospital emergency department, the [facility], or call 911 for further evaluation and treatment. National Suicide Prevention Lifeline 1-800-SUICIDE or 4841638715.  About 988 988 offers 24/7 access to trained crisis counselors who can help people experiencing mental health-related distress. People  can call or text 988 or chat 988lifeline.org for themselves or if they are worried about a loved one who may need crisis support.   Outpatient Therapy and Psychiatry for Medicare Recipients  [facility] [address] [place], [place], [address] [phone] phone  [facility] [address] [place], [place], [address] [address] phone ([address], [employer] [address] - [place], [address], [place], [place], [employer], [address], [employer] [facility] [place], [place], [address], [place], [place], [place], [employer], [employer], [employer], [employer], [place])  [facility] [address] [place], [place], [address] [phone] phone  [facility] [address] [place], [place], [address] [phone] phone  [facility] [address] [place], [place], [address] [phone] phone [phone] fax  [facility] [place] [address], [place], [address] [phone] phone  [facility] [address] [place], [place], [address] [phone] phone [phone] fax  [facility] [address] [place], [place], [address] [phone] phone  Janit Griffins [address] [place], [place], [address] [phone] phone  [facility] [address] [place], [place], [address] [phone] phone [phone] fax

## 2023-11-28 DIAGNOSIS — F3341 Major depressive disorder, recurrent, in partial remission: Secondary | ICD-10-CM | POA: Diagnosis not present

## 2023-11-28 DIAGNOSIS — Z09 Encounter for follow-up examination after completed treatment for conditions other than malignant neoplasm: Secondary | ICD-10-CM | POA: Diagnosis not present

## 2023-11-28 DIAGNOSIS — J45909 Unspecified asthma, uncomplicated: Secondary | ICD-10-CM | POA: Diagnosis not present

## 2023-11-29 DIAGNOSIS — Z1231 Encounter for screening mammogram for malignant neoplasm of breast: Secondary | ICD-10-CM | POA: Diagnosis not present

## 2023-11-29 DIAGNOSIS — Z6828 Body mass index (BMI) 28.0-28.9, adult: Secondary | ICD-10-CM | POA: Diagnosis not present

## 2023-11-29 DIAGNOSIS — Z01419 Encounter for gynecological examination (general) (routine) without abnormal findings: Secondary | ICD-10-CM | POA: Diagnosis not present

## 2023-12-04 DIAGNOSIS — K219 Gastro-esophageal reflux disease without esophagitis: Secondary | ICD-10-CM | POA: Diagnosis not present

## 2023-12-04 DIAGNOSIS — J8283 Eosinophilic asthma: Secondary | ICD-10-CM | POA: Diagnosis not present

## 2023-12-04 DIAGNOSIS — J45909 Unspecified asthma, uncomplicated: Secondary | ICD-10-CM | POA: Diagnosis not present

## 2023-12-04 DIAGNOSIS — F324 Major depressive disorder, single episode, in partial remission: Secondary | ICD-10-CM | POA: Diagnosis not present

## 2023-12-04 DIAGNOSIS — I10 Essential (primary) hypertension: Secondary | ICD-10-CM | POA: Diagnosis not present

## 2023-12-04 DIAGNOSIS — F411 Generalized anxiety disorder: Secondary | ICD-10-CM | POA: Diagnosis not present

## 2023-12-04 DIAGNOSIS — R32 Unspecified urinary incontinence: Secondary | ICD-10-CM | POA: Diagnosis not present

## 2023-12-04 DIAGNOSIS — E785 Hyperlipidemia, unspecified: Secondary | ICD-10-CM | POA: Diagnosis not present

## 2023-12-04 DIAGNOSIS — G250 Essential tremor: Secondary | ICD-10-CM | POA: Diagnosis not present

## 2023-12-13 DIAGNOSIS — F3341 Major depressive disorder, recurrent, in partial remission: Secondary | ICD-10-CM | POA: Diagnosis not present

## 2023-12-13 DIAGNOSIS — Z23 Encounter for immunization: Secondary | ICD-10-CM | POA: Diagnosis not present

## 2023-12-13 DIAGNOSIS — D649 Anemia, unspecified: Secondary | ICD-10-CM | POA: Diagnosis not present

## 2023-12-13 DIAGNOSIS — F411 Generalized anxiety disorder: Secondary | ICD-10-CM | POA: Diagnosis not present

## 2023-12-15 ENCOUNTER — Ambulatory Visit: Admitting: Cardiology

## 2023-12-27 ENCOUNTER — Emergency Department (HOSPITAL_COMMUNITY)
Admission: EM | Admit: 2023-12-27 | Discharge: 2023-12-27 | Disposition: A | Discharge status: Home / Self Care | Attending: Emergency Medicine | Admitting: Emergency Medicine

## 2023-12-27 ENCOUNTER — Other Ambulatory Visit: Payer: Self-pay

## 2023-12-27 ENCOUNTER — Encounter (HOSPITAL_COMMUNITY): Payer: Self-pay

## 2023-12-27 DIAGNOSIS — F419 Anxiety disorder, unspecified: Secondary | ICD-10-CM | POA: Diagnosis not present

## 2023-12-27 DIAGNOSIS — Z7982 Long term (current) use of aspirin: Secondary | ICD-10-CM | POA: Insufficient documentation

## 2023-12-27 DIAGNOSIS — Z79899 Other long term (current) drug therapy: Secondary | ICD-10-CM | POA: Insufficient documentation

## 2023-12-27 DIAGNOSIS — R519 Headache, unspecified: Secondary | ICD-10-CM | POA: Diagnosis not present
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
## 2023-12-27 NOTE — Discharge Instructions (Signed)
 Call your doctor today to discuss changing your blood pressure medications
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
## 2023-12-30 ENCOUNTER — Ambulatory Visit (INDEPENDENT_AMBULATORY_CARE_PROVIDER_SITE_OTHER): Payer: Medicare PPO | Admitting: Otolaryngology

## 2023-12-30 DIAGNOSIS — J3089 Other allergic rhinitis: Secondary | ICD-10-CM | POA: Insufficient documentation

## 2023-12-30 DIAGNOSIS — J4530 Mild persistent asthma, uncomplicated: Secondary | ICD-10-CM | POA: Insufficient documentation

## 2023-12-30 DIAGNOSIS — J300 Vasomotor rhinitis: Secondary | ICD-10-CM | POA: Insufficient documentation

## 2023-12-30 DIAGNOSIS — H1045 Other chronic allergic conjunctivitis: Secondary | ICD-10-CM | POA: Insufficient documentation

## 2024-01-02 ENCOUNTER — Other Ambulatory Visit (HOSPITAL_BASED_OUTPATIENT_CLINIC_OR_DEPARTMENT_OTHER): Payer: Self-pay

## 2024-01-02 ENCOUNTER — Ambulatory Visit (INDEPENDENT_AMBULATORY_CARE_PROVIDER_SITE_OTHER): Admitting: Psychiatry

## 2024-01-02 ENCOUNTER — Other Ambulatory Visit: Payer: Self-pay

## 2024-01-02 ENCOUNTER — Encounter (HOSPITAL_COMMUNITY): Payer: Self-pay | Admitting: Psychiatry

## 2024-01-02 ENCOUNTER — Other Ambulatory Visit (HOSPITAL_COMMUNITY): Payer: Self-pay

## 2024-01-02 VITALS — BP 152/82 | HR 108 | Temp 98.1°F | Wt 153.6 lb

## 2024-01-02 DIAGNOSIS — F332 Major depressive disorder, recurrent severe without psychotic features: Secondary | ICD-10-CM

## 2024-01-02 DIAGNOSIS — F411 Generalized anxiety disorder: Secondary | ICD-10-CM

## 2024-01-02 DIAGNOSIS — G4701 Insomnia due to medical condition: Secondary | ICD-10-CM

## 2024-01-02 MED ORDER — MIRTAZAPINE 30 MG PO TABS
30.0000 mg | ORAL_TABLET | Freq: Every day | ORAL | 3 refills | Status: DC
  Filled 2024-01-02: qty 30, 30d supply, fill #0
  Filled 2024-02-27: qty 30, 30d supply, fill #1

## 2024-01-02 MED ORDER — BUPROPION HCL ER (XL) 300 MG PO TB24
300.0000 mg | ORAL_TABLET | Freq: Every day | ORAL | 3 refills | Status: DC
  Filled 2024-01-02 – 2024-01-14 (×3): qty 30, 30d supply, fill #0
  Filled 2024-02-17: qty 30, 30d supply, fill #1

## 2024-01-02 MED ORDER — RISPERIDONE 2 MG PO TABS
2.0000 mg | ORAL_TABLET | Freq: Every day | ORAL | 3 refills | Status: DC
  Filled 2024-01-02 – 2024-02-28 (×4): qty 30, 30d supply, fill #0

## 2024-01-02 MED ORDER — HYDROXYZINE HCL 25 MG PO TABS
25.0000 mg | ORAL_TABLET | Freq: Three times a day (TID) | ORAL | 3 refills | Status: DC | PRN
  Filled 2024-01-02 (×2): qty 90, 30d supply, fill #0
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
## 2024-01-03 ENCOUNTER — Telehealth: Payer: Self-pay | Admitting: Cardiology

## 2024-01-03 ENCOUNTER — Ambulatory Visit: Attending: Cardiology | Admitting: Cardiology

## 2024-01-03 ENCOUNTER — Encounter: Payer: Self-pay | Admitting: Cardiology

## 2024-01-03 VITALS — BP 130/70 | HR 84 | Ht 63.0 in | Wt 152.8 lb

## 2024-01-03 DIAGNOSIS — I447 Left bundle-branch block, unspecified: Secondary | ICD-10-CM

## 2024-01-03 DIAGNOSIS — I10 Essential (primary) hypertension: Secondary | ICD-10-CM | POA: Diagnosis not present

## 2024-01-03 DIAGNOSIS — Z953 Presence of xenogenic heart valve: Secondary | ICD-10-CM

## 2024-01-03 MED ORDER — DILTIAZEM HCL ER COATED BEADS 120 MG PO CP24: 120.0000 mg | ORAL_CAPSULE | Freq: Every day | ORAL | 3 refills | Status: DC

## 2024-01-03 NOTE — Telephone Encounter (Signed)
 Patient would like all [facility] prescriptions [facility] to [facility] on [address] in [place]. CB # [phone]

## 2024-01-03 NOTE — Progress Notes (Signed)
 Cardiology Office Note:    Date:  01/03/2024   ID:  Miranda Berger, Miranda Berger Jan 25, 1956, MRN [id number]  PCP:  Miranda Righter, MD  Cardiologist:  Miranda Leiter, MD    Referring MD: Miranda Righter, MD    ASSESSMENT:    1. 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 Primary hypertension   3. LBBB (left bundle branch block)   4. Obstructive sleep apnea    PLAN:    In order of problems listed above:  Unfortunately despite a good result from minimally invasive aortic valve replacement she has a great deal of anxiety regarding heart disease Reassured her by her examination and EKG and the knowledge she did not have CAD at the time of coronary angiography To try to alleviate some of her awareness of the heart will add a rate slowing calcium  channel blocker to antihypertensive agents continue ACE inhibitor EKG pattern is stable   Next appointment: 1 year   Medication Adjustments/Labs and Tests Ordered: Current medicines are reviewed at length with the patient today.  Concerns regarding medicines are outlined above.  Orders Placed This Encounter  Procedures   EKG 12-Lead   No orders of the defined types were placed in this encounter.    History of Present Illness:    Miranda Berger is a 68 y.o. female with a hx of symptomatic aortic stenosis of bioprosthetic AVR 07/26/2017 hypertension left bundle branch block sinus tachycardia symptomatic and sleep apnea last seen 12/16/2022.  Prior to AVR left heart catheterization showed normal coronary arteriography her echocardiogram in 2023 showed normal bioprosthetic AVR function left ventricle normal in size normal ejection fraction.  She was seen in the emergency room Miranda Berger August for chest pain component studies were normal EKG showed sinus rhythm with minor nonspecific ST abnormality was not felt to have ischemic chest pain or ACS and discharged from the hospital.  Compliance with diet,  lifestyle and medications: Yes  She is under intense stress aware of her heart beating feels rapid at times but attributes it to stress Not having chest pain shortness of breath edema or syncope Past Medical History:  Diagnosis Date   Abdominal pain 06/22/2012   Anemia    in the past    Anxiety    Aortic stenosis 06/20/2017   Asthma    Chronic allergic conjunctivitis 12/30/2023   Chronic interstitial cystitis 06/22/2012   Costochondral chest pain 04/04/2018   Degenerative tear of medial meniscus of right knee 04/21/2016   Depression    Difficult or painful urination 06/22/2012   Dyspareunia 06/22/2012   Family history of adverse reaction to anesthesia    mom had nausea   Female pelvic pain 06/22/2012   GERD (gastroesophageal reflux disease)    Hepatitis    Hepatitis A in 3rd grade?   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   MDD (major depressive disorder), recurrent, severe, with psychosis (HCC) 10/14/2023   Mild persistent asthma without complication 12/30/2023   Obstructive sleep apnea    Onychomycosis 08/16/2023   Other allergic rhinitis 12/30/2023   Pericarditis 09/12/2017   PONV (postoperative nausea and vomiting)    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 07/26/2017   [employer], size  23   Urinary urgency 06/22/2012   Vasomotor rhinitis 12/30/2023    Current Medications: Current Meds  Medication Sig   acetaminophen  (TYLENOL ) 500 MG tablet Take 1,000 mg by mouth every 8 (eight) hours as needed.   albuterol  (PROVENTIL  HFA;VENTOLIN  HFA) 108 (90 Base) MCG/ACT inhaler Inhale 2 puffs into the lungs every 6 (six) hours as needed for wheezing or shortness  of breath.   aspirin  EC 81 MG tablet Take 81 mg by mouth daily.   buPROPion  (WELLBUTRIN  XL) 300 MG 24 hr tablet Take 1 tablet (300 mg total) by mouth daily.   calcium  carbonate (OS-CAL) 600 MG TABS tablet Take 600 mg by mouth daily.    cyanocobalamin  (VITAMIN B12) 500 MCG tablet Take 500 mcg by mouth daily.   fluticasone  (FLONASE ) 50 MCG/ACT nasal spray Place 1 spray into both nostrils daily as needed for allergies.    Fluticasone -Salmeterol (ADVAIR) 250-50 MCG/DOSE AEPB Inhale 1 puff into the lungs 2 (two) times daily.   hydrOXYzine  (ATARAX ) 25 MG tablet Take 1 tablet (25 mg total) by mouth 3 (three) times daily as needed for anxiety.   levocetirizine (XYZAL) 5 MG tablet Take 5 mg by mouth daily.   lisinopril  (ZESTRIL ) 5 MG tablet Take 1 tablet (5 mg total) by mouth daily.   mirtazapine  (REMERON ) 30 MG tablet Take 1 tablet (30 mg total) by mouth at bedtime.   montelukast  (SINGULAIR ) 10 MG tablet Take 10 mg by mouth daily.   Multiple Vitamin (MULTIVITAMIN) capsule Take 1 capsule by mouth daily.   pantoprazole  (PROTONIX ) 40 MG tablet Take 1 tablet (40 mg total) by mouth daily for 14 days.   risperiDONE  (RISPERDAL ) 2 MG tablet Take 1 tablet (2 mg total) by mouth at bedtime.   saccharomyces boulardii (FLORASTOR) 250 MG capsule Take 250 mg by mouth daily.      EKGs/Labs/Other Studies Reviewed:    The following studies were reviewed today:  Cardiac Studies & Procedures   ______________________________________________________________________________________________ CARDIAC CATHETERIZATION  CARDIAC CATHETERIZATION 07/08/2017  Conclusion 1. Angiographically normal coronary arteries (left dominant) 2. Moderate aortic stenosis based on hemodynamic criteria with mean gradient 20 mmHg and AVA 1.25 square cm 3. Normal intracardiac filling pressures (right and left heart)  Data reviewed: Agree patient likely has severe paradoxical LFLG aortic stenosis with moderately increased transaortic  gradients in the setting of low stroke volume (SVI by echo 24) and heavy aortic calcification (aortic valve agaston score > 1275). Plans as outlined by Dr Miranda Berger with pending cardiac surgical evaluation for AVR.  Findings Coronary Findings Diagnostic  Dominance: Left  Left Main Vessel is angiographically normal.  Left Circumflex Vessel is angiographically normal.  First Obtuse Marginal Branch Vessel is angiographically normal.  Right Coronary Artery Vessel is angiographically normal.  Intervention  No interventions have been documented.     ECHOCARDIOGRAM  ECHOCARDIOGRAM LIMITED 12/17/2021  Narrative ECHOCARDIOGRAM LIMITED REPORT    Patient Name:   Mical SUMMERS Fortin Date of Exam: 12/17/2021 Medical Rec #:  [id number]         Height:       63.0 in Accession #:    [id number]        Weight:       168.0 lb Date of Birth:  January 09, 1956         BSA:          1.796 m Patient Age:    66 years          BP:  132/94 mmHg Patient Gender: F                 HR:           81 bpm. Exam Location:  [place]  Procedure: Limited Echo, Cardiac Doppler and Color Doppler  Indications:    Aortic valve replaced [Z95.2 (ICD-10-CM)]  Limited echo to evaluate MV  History:        Patient has prior history of Echocardiogram examinations, most recent 12/15/2021. Aortic Valve Disease. MINIMALLY INVASIVE AORTIC VALVE REPLACEMENT (AVR. Aortic Valve: bioprosthetic valve is present in the aortic position. Procedure Date: 07/26/2017.  Sonographer:    Charlie Jointer RDCS Referring Phys: [id number] Jessicah Croll J Demerius Podolak  IMPRESSIONS   1. Left ventricular ejection fraction, by estimation, is 55 to 60%. The left ventricle has normal function. The left ventricle has no regional wall motion abnormalities. 2. Right ventricular systolic function is normal. The right ventricular size is normal. 3. Limited echo to evaluate MV. The mitral valve is normal in structure. Trivial mitral valve regurgitation. No  evidence of mitral stenosis. 4. Aortic valve regurgitation is not visualized. There is a bioprosthetic valve present in the aortic position. Procedure Date: 07/26/2017. Echo findings are consistent with normal structure and function of the aortic valve prosthesis.  FINDINGS Left Ventricle: Left ventricular ejection fraction, by estimation, is 55 to 60%. The left ventricle has normal function. The left ventricle has no regional wall motion abnormalities.  Right Ventricle: The right ventricular size is normal. Right ventricular systolic function is normal.  Left Atrium: Left atrial size was not assessed.  Right Atrium: Right atrial size was not assessed.  Mitral Valve: Limited echo to evaluate MV. The mitral valve is normal in structure. Trivial mitral valve regurgitation. No evidence of mitral valve stenosis.  Tricuspid Valve: The tricuspid valve is normal in structure. Tricuspid valve regurgitation is trivial.  Aortic Valve: Aortic valve regurgitation is not visualized. Aortic valve mean gradient measures 9.0 mmHg. Aortic valve peak gradient measures 15.5 mmHg. There is a bioprosthetic valve present in the aortic position. Procedure Date: 07/26/2017. Echo findings are consistent with normal structure and function of the aortic valve prosthesis.  Pulmonic Valve: The pulmonic valve was not assessed.  Aorta: The aortic root and ascending aorta are structurally normal, with no evidence of dilitation.  Venous: Not assessed. Not assessed.   AORTIC VALVE AV Vmax:           197.00 cm/s AV Vmean:          138.000 cm/s AV VTI:            0.395 m AV Peak Grad:      15.5 mmHg AV Mean Grad:      9.0 mmHg LVOT Vmax:         72.50 cm/s LVOT Vmean:        58.000 cm/s LVOT VTI:          0.161 m LVOT/AV VTI ratio: 0.41  MITRAL VALVE MV Area (PHT): 5.54 cm    SHUNTS MV Decel Time: 137 msec    Systemic VTI: 0.16 m MV E velocity: 84.20 cm/s MV A velocity: 85.90 cm/s MV E/A ratio:  0.98  Miranda Leiter MD Electronically signed by Miranda Leiter MD Signature Date/Time: 12/17/2021/3:55:21 PM    Final   TEE  ECHO TEE 07/26/2017  Interpretation Summary  Septum: No Patent Foramen Ovale present.  Left atrium: Patent foramen ovale not present.  Mitral valve: The mitral valve appeared structurally normal. The  leaflets coapted normally without prolapse or fluttering leaflet segments. There was trace mitral insufficiency.  Right ventricle: The right ventricular cavity was normal in size. There was normal right ventricular systolic function. The TAPSE measured 17.2 mm. There was normal contractility of the right ventricular free wall. Post-bypass exam there was normal appearing right ventricular systolic function.  Pulmonic valve: Trace regurgitation.  MONITORS  LONG TERM MONITOR (3-14 DAYS) 12/27/2017  Narrative A ZiO monitor was utilized for 7 days and 2 hours in order to assess palpitation after aortic valve replacement for aortic stenosis.  The rhythm throughout his sinus minimum average and maximum heart rates are 73 104 and 156 bpm.  There are no bradycardic events and no episodes of sinus node or AV block.  Ventricular ectopy was rare less than 1% with isolated PVCs and couplets.  Supraventricular arrhythmia was rare less than 1% with isolated APCs and couplets.  There were 2 brief runs of atrial premature contraction longest 9 beats at a rate of 138 the fastest 7 complexes at a rate of 154 bpm.  These were brief episodes of atrial tachycardia.  There were no episodes of atrial fibrillation or flutter.  One triggered event occurred associated with sinus tachycardia and a single ventricular premature contraction   Conclusion, rare ventricular and supraventricular ectopy with 2 brief episodes of atrial tachycardia.   CT SCANS  CT CORONARY MORPH W/CTA COR W/SCORE 07/20/2017  Addendum 07/20/2017 12:58 PM ADDENDUM REPORT: 07/20/2017 12:55  CLINICAL DATA:  67 year old  female with severe aortic stenosis being evaluated for a minimally invasive AVR. Evaluate aortic valve, aortic root and thoracic aorta.  EXAM: Cardiac TAVR CT  TECHNIQUE: The patient was scanned on a [employer]. A 120 kV retrospective scan was triggered in the descending thoracic aorta at 111 HU's. Gantry rotation speed was 250 msecs and collimation was .6 mm. No beta blockade or nitro were given. The 3D data set was reconstructed in 5% intervals of the R-R cycle. Systolic and diastolic phases were analyzed on a dedicated work station using MPR, MIP and VRT modes. The patient received 80 cc of contrast.  FINDINGS: Aortic Valve: Trileaflet, but functionally bileaflet aortic valve with co-joined left and right leaflet with moderate calcifications.  Aorta: Normal size, no calcifications or atherosclerosis, no dissection.  Sinotubular Junction: 27 x 26 mm  Ascending Thoracic Aorta: 34 x 33 mm  Aortic Arch: 23 x 21 mm  Descending Thoracic Aorta: 22 x 21 mm  Sinus of Valsalva Measurements:  Non-coronary: 31 mm  Right -coronary: 27 mm  Left -coronary: 29 mm  Virtual Basal Annulus Measurements:  Maximum/Minimum Diameter: 28.3 x 22.2 mm  Mean Diameter: 24.4 mm  Perimeter: 79.2 mm  Area: 468 mm2  Coronary Arteries: Normal origin. Left dominance, minimal non-calcified plaque in the proximal LAD.  IMPRESSION: 1. Trileaflet, but functionally bileaflet aortic valve with co-joined left and right leaflet with moderate calcifications.  2. Thoracic aorta: normal size, no calcifications or atherosclerosis, no dissection.  3. No thrombus in the left atrial appendage.  4. Coronary Arteries: Normal origin. Left dominance, minimal non-calcified plaque in the proximal LAD.  5. No other congenital anomalies identified.   Electronically Signed By: Leim Moose On: 07/20/2017 12:55  Narrative EXAM: OVER-READ INTERPRETATION  CT CHEST  The following  report is an over-read performed by radiologist Dr. Toribio Aye of [facility] on 07/20/2017. This over-read does not include interpretation of cardiac or coronary anatomy or pathology. The coronary calcium  score/coronary CTA interpretation  by the cardiologist is attached.  COMPARISON:  Coronary calcium  CT scan 07/01/2017.  FINDINGS: Extracardiac findings will be described separately under dictation for contemporaneously obtained CTA chest, abdomen and pelvis.  IMPRESSION: Please see separate dictation for contemporaneously obtained CTA chest, abdomen and pelvis 07/20/2017 for full description of relevant extracardiac findings.  Electronically Signed: By: Toribio Aye M.D. On: 07/20/2017 10:52   CT SCANS  CT CARDIAC SCORING (SELF PAY ONLY) 07/01/2017  Addendum 07/04/2017  7:24 AM ADDENDUM REPORT: 07/04/2017 07:21  EXAM: OVER-READ INTERPRETATION  CT CHEST  The following report is an over-read performed by radiologist Dr. Toribio Cove [facility] on 07/04/2017. This over-read does not include interpretation of cardiac or coronary anatomy or pathology. The coronary calcium  score interpretation by the cardiologist is attached.  COMPARISON:  Chest CT 06/24/2010  FINDINGS: Linear scarring or subsegmental atelectasis noted throughout visualized portions of the left lower lobe and posterior aspect of the left upper lobe. Within the visualized portions of the thorax there are no suspicious appearing pulmonary nodules or masses, there is no acute consolidative airspace disease, no pleural effusions, no pneumothorax and no lymphadenopathy. Visualized portions of the upper abdomen are unremarkable. There are no aggressive appearing lytic or blastic lesions noted in the visualized portions of the skeleton.  IMPRESSION: 1. Areas of mild scarring and/or subsegmental atelectasis are noted in the visualized portions of the left lung, new compared  to prior study from 2012.   Electronically Signed By: Toribio Aye M.D. On: 07/04/2017 07:21  Addendum 07/01/2017  5:56 PM ADDENDUM REPORT: 07/01/2017 17:53  ADDENDUM: Aortic valve calcium  score is 1377.  Leim Moose   Electronically Signed By: Leim Moose On: 07/01/2017 17:53  Narrative CLINICAL DATA:  Risk stratification  EXAM: Coronary Calcium  Score  MEDICATIONS: None.  TECHNIQUE: The patient was scanned on a [employer]. Axial non-contrast 3 mm slices were carried out through the heart. The data set was analyzed on a dedicated work station and scored using the Agatson method.  FINDINGS: Non-cardiac: See separate report from [facility].  Ascending Aorta: Normal size, mild to moderate calcifications in the aortic root.  Pericardium: Normal.  Coronary arteries: Normal origin.  IMPRESSION: Coronary calcium  score of 0. This was 0 percentile for age and sex matched control.  Electronically Signed: By: Leim Moose On: 07/01/2017 15:00     ______________________________________________________________________________________________      EKG Interpretation Date/Time:  Tuesday January 03 2024 14:23:25 EST Ventricular Rate:  84 PR Interval:  164 QRS Duration:  82 QT Interval:  374 QTC Calculation: 441 R Axis:   65  Text Interpretation: Normal sinus rhythm Normal ECG When compared with ECG of 27-Dec-2023 13:34, PREVIOUS ECG IS PRESENT Confirmed by Miranda Berger Rogue ([id number]) on 01/03/2024 2:26:23 PM   Recent Labs: 10/16/2023: Magnesium  1.9 11/16/2023: ALT 18; BUN 13; Creatinine, Ser 0.82; Hemoglobin 10.9; Platelets 235; Potassium 3.7; Sodium 137; TSH 1.400  Recent Lipid Panel    Component Value Date/Time   CHOL 209 (H) 10/16/2023 1154   TRIG 86 10/16/2023 1154   HDL 83 10/16/2023 1154   CHOLHDL 2.5 10/16/2023 1154   VLDL 17 10/16/2023 1154   LDLCALC 109 (H) 10/16/2023 1154    Physical Exam:    VS:  BP 130/70    Pulse 84   Ht 5' 3 (1.6 m)   Wt 152 lb 12.8 oz (69.3 kg)   SpO2 98%   BMI 27.07 kg/m     Wt Readings from Last 3 Encounters:  01/03/24 152 lb 12.8 oz (69.3 kg)  12/27/23 158 lb (71.7 kg)  11/15/23 158 lb 12.8 oz (72 kg)     GEN:  Well nourished, well developed in no acute distress HEENT: Normal NECK: No JVD; No carotid bruits LYMPHATICS: No lymphadenopathy CARDIAC: RRR, no murmurs, rubs, gallops RESPIRATORY:  Clear to auscultation without rales, wheezing or rhonchi  ABDOMEN: Soft, non-tender, non-distended MUSCULOSKELETAL:  No edema; No deformity  SKIN: Warm and dry NEUROLOGIC:  Alert and oriented x 3 PSYCHIATRIC:  Normal affect    Signed, Miranda Leiter, MD  01/03/2024 2:26 PM    [facility]

## 2024-01-03 NOTE — Patient Instructions (Signed)
 Medication Instructions:  Your physician has recommended you make the following change in your medication:   START: Cardizem CD 120 mg daily  *If you need a refill on your cardiac medications before your next appointment, please call your pharmacy*  Lab Work: None If you have labs (blood work) drawn today and your tests are completely normal, you will receive your results only by: MyChart Message (if you have MyChart) OR A paper copy in the mail If you have any lab test that is abnormal or we need to change your treatment, we will call you to review the results.  Testing/Procedures: None  Follow-Up: At [facility], you and your health needs are our priority.  As part of our continuing mission to provide you with exceptional heart care, our providers are all part of one team.  This team includes your primary Cardiologist (physician) and Advanced Practice Providers or APPs (Physician Assistants and Nurse Practitioners) who all work together to provide you with the care you need, when you need it.  Your next appointment:   1 year(s)  Provider:   Redell Leiter, MD    We recommend signing up for the patient portal called MyChart.  Sign up information is provided on this After Visit Summary.  MyChart is used to connect with patients for Virtual Visits (Telemedicine).  Patients are able to view lab/test results, encounter notes, upcoming appointments, etc.  Non-urgent messages can be sent to your provider as well.   To learn more about what you can do with MyChart, go to [email or web address].   Other Instructions None

## 2024-01-04 ENCOUNTER — Other Ambulatory Visit (HOSPITAL_BASED_OUTPATIENT_CLINIC_OR_DEPARTMENT_OTHER): Payer: Self-pay

## 2024-01-04 MED ORDER — DILTIAZEM HCL ER COATED BEADS 120 MG PO CP24
120.0000 mg | ORAL_CAPSULE | Freq: Every day | ORAL | 3 refills | Status: AC
Start: 2024-01-04 — End: ?
  Filled 2024-01-04 – 2024-01-07 (×2): qty 90, 90d supply, fill #0

## 2024-01-04 NOTE — Telephone Encounter (Signed)
 RX sent to [email or web address]

## 2024-01-07 ENCOUNTER — Other Ambulatory Visit (HOSPITAL_BASED_OUTPATIENT_CLINIC_OR_DEPARTMENT_OTHER): Payer: Self-pay

## 2024-01-09 ENCOUNTER — Telehealth (HOSPITAL_COMMUNITY): Payer: Self-pay

## 2024-01-09 NOTE — Telephone Encounter (Signed)
 Patient called in requesting help. Patient states her morning medications are missing. Patient states her daughter in law has her morning mediation bag. Medications were reviewed with patient. Patient seems confused and flustered. Patient was hard to follow with her storyline. There is no ROI on file to reach to family for clarity.   Patient stated she has issues trusting people right now and wants someone to tell her what to do.

## 2024-01-10 NOTE — Telephone Encounter (Signed)
 Provider called patient and she informed writer that she found her medications. No other concerns notes at this time.

## 2024-01-11 ENCOUNTER — Other Ambulatory Visit (HOSPITAL_BASED_OUTPATIENT_CLINIC_OR_DEPARTMENT_OTHER): Payer: Self-pay

## 2024-01-12 ENCOUNTER — Other Ambulatory Visit (HOSPITAL_BASED_OUTPATIENT_CLINIC_OR_DEPARTMENT_OTHER): Payer: Self-pay

## 2024-01-14 ENCOUNTER — Other Ambulatory Visit (HOSPITAL_BASED_OUTPATIENT_CLINIC_OR_DEPARTMENT_OTHER): Payer: Self-pay

## 2024-01-21 ENCOUNTER — Other Ambulatory Visit (HOSPITAL_BASED_OUTPATIENT_CLINIC_OR_DEPARTMENT_OTHER): Payer: Self-pay

## 2024-01-21 MED ORDER — MIRTAZAPINE 15 MG PO TABS: 15.0000 mg | ORAL_TABLET | Freq: Every day | ORAL | 2 refills | Status: DC

## 2024-01-21 MED ORDER — FLUTICASONE-SALMETEROL 250-50 MCG/ACT IN AEPB
1.0000 | INHALATION_SPRAY | Freq: Two times a day (BID) | RESPIRATORY_TRACT | 3 refills | Status: AC
Start: 2023-03-25 — End: ?

## 2024-01-21 MED ORDER — HYDROXYZINE HCL 25 MG PO TABS: 25.0000 mg | ORAL_TABLET | Freq: Three times a day (TID) | ORAL | 2 refills | Status: DC | PRN

## 2024-01-21 MED ORDER — ALBUTEROL SULFATE HFA 108 (90 BASE) MCG/ACT IN AERS
2.0000 | INHALATION_SPRAY | Freq: Four times a day (QID) | RESPIRATORY_TRACT | 2 refills | Status: AC | PRN
Start: 2023-06-19 — End: ?

## 2024-01-21 MED ORDER — AZELASTINE HCL 0.05 % OP SOLN: 1.0000 [drp] | Freq: Two times a day (BID) | OPHTHALMIC | 3 refills | Status: DC

## 2024-01-21 MED ORDER — MIRTAZAPINE 15 MG PO TABS
15.0000 mg | ORAL_TABLET | Freq: Every day | ORAL | 2 refills | Status: DC
  Filled 2024-01-27: qty 30, 30d supply, fill #0

## 2024-01-21 MED ORDER — BUPROPION HCL ER (XL) 300 MG PO TB24: 300.0000 mg | ORAL_TABLET | Freq: Every morning | ORAL | 2 refills | Status: DC

## 2024-01-21 MED ORDER — AZELASTINE HCL 137 MCG/SPRAY NA SOLN
1.0000 | Freq: Two times a day (BID) | NASAL | 3 refills | Status: AC
  Filled 2024-02-17: qty 30, 30d supply, fill #0

## 2024-01-21 MED ORDER — OMEPRAZOLE 40 MG PO CPDR: 40.0000 mg | DELAYED_RELEASE_CAPSULE | Freq: Every day | ORAL | 3 refills | Status: DC

## 2024-01-21 MED ORDER — LEVOCETIRIZINE DIHYDROCHLORIDE 5 MG PO TABS
5.0000 mg | ORAL_TABLET | Freq: Every evening | ORAL | 3 refills | Status: AC
  Filled 2024-01-21: qty 30, 30d supply, fill #0

## 2024-01-21 MED ORDER — DILTIAZEM HCL ER 120 MG PO CP24: 120.0000 mg | ORAL_CAPSULE | Freq: Every day | ORAL | 3 refills | Status: AC

## 2024-01-21 MED ORDER — MONTELUKAST SODIUM 10 MG PO TABS
10.0000 mg | ORAL_TABLET | Freq: Every day | ORAL | 3 refills | Status: AC
  Filled 2024-01-21: qty 30, 30d supply, fill #0
  Filled 2024-01-27: qty 30, 30d supply, fill #1

## 2024-01-21 MED ORDER — FLUTICASONE-SALMETEROL 250-50 MCG/ACT IN AEPB: 1.0000 | INHALATION_SPRAY | Freq: Two times a day (BID) | RESPIRATORY_TRACT | 3 refills | Status: AC

## 2024-01-23 ENCOUNTER — Other Ambulatory Visit (HOSPITAL_BASED_OUTPATIENT_CLINIC_OR_DEPARTMENT_OTHER): Payer: Self-pay

## 2024-01-27 ENCOUNTER — Other Ambulatory Visit (HOSPITAL_BASED_OUTPATIENT_CLINIC_OR_DEPARTMENT_OTHER): Payer: Self-pay

## 2024-01-27 ENCOUNTER — Other Ambulatory Visit: Payer: Self-pay

## 2024-02-01 ENCOUNTER — Encounter (HOSPITAL_COMMUNITY): Payer: Self-pay | Admitting: Student in an Organized Health Care Education/Training Program

## 2024-02-01 ENCOUNTER — Other Ambulatory Visit (HOSPITAL_BASED_OUTPATIENT_CLINIC_OR_DEPARTMENT_OTHER): Payer: Self-pay

## 2024-02-01 ENCOUNTER — Ambulatory Visit (HOSPITAL_COMMUNITY): Admitting: Student in an Organized Health Care Education/Training Program

## 2024-02-01 DIAGNOSIS — F411 Generalized anxiety disorder: Secondary | ICD-10-CM

## 2024-02-01 DIAGNOSIS — F332 Major depressive disorder, recurrent severe without psychotic features: Secondary | ICD-10-CM

## 2024-02-01 DIAGNOSIS — G4701 Insomnia due to medical condition: Secondary | ICD-10-CM | POA: Diagnosis not present
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
## 2024-02-04 ENCOUNTER — Other Ambulatory Visit (HOSPITAL_BASED_OUTPATIENT_CLINIC_OR_DEPARTMENT_OTHER): Payer: Self-pay

## 2024-02-06 ENCOUNTER — Other Ambulatory Visit (HOSPITAL_BASED_OUTPATIENT_CLINIC_OR_DEPARTMENT_OTHER): Payer: Self-pay

## 2024-02-06 ENCOUNTER — Other Ambulatory Visit (HOSPITAL_COMMUNITY): Payer: Self-pay | Admitting: Student in an Organized Health Care Education/Training Program

## 2024-02-06 DIAGNOSIS — F411 Generalized anxiety disorder: Secondary | ICD-10-CM

## 2024-02-07 ENCOUNTER — Other Ambulatory Visit (HOSPITAL_BASED_OUTPATIENT_CLINIC_OR_DEPARTMENT_OTHER): Payer: Self-pay

## 2024-02-07 MED ORDER — HYDROXYZINE HCL 25 MG PO TABS
25.0000 mg | ORAL_TABLET | Freq: Three times a day (TID) | ORAL | 2 refills | Status: DC | PRN
  Filled 2024-02-07: qty 90, 30d supply, fill #0

## 2024-02-17 ENCOUNTER — Other Ambulatory Visit (HOSPITAL_BASED_OUTPATIENT_CLINIC_OR_DEPARTMENT_OTHER): Payer: Self-pay

## 2024-02-21 ENCOUNTER — Other Ambulatory Visit (HOSPITAL_BASED_OUTPATIENT_CLINIC_OR_DEPARTMENT_OTHER): Payer: Self-pay

## 2024-02-21 ENCOUNTER — Other Ambulatory Visit: Payer: Self-pay

## 2024-02-21 MED ORDER — FLUTICASONE-SALMETEROL 250-50 MCG/ACT IN AEPB
1.0000 | INHALATION_SPRAY | Freq: Two times a day (BID) | RESPIRATORY_TRACT | 3 refills | Status: AC
  Filled 2024-02-21: qty 180, 90d supply, fill #0

## 2024-02-21 MED ORDER — FLUTICASONE PROPIONATE 50 MCG/ACT NA SUSP
1.0000 | Freq: Every day | NASAL | 3 refills | Status: AC
  Filled 2024-02-21: qty 48, 90d supply, fill #0

## 2024-02-21 MED ORDER — ALBUTEROL SULFATE HFA 108 (90 BASE) MCG/ACT IN AERS
1.0000 | INHALATION_SPRAY | RESPIRATORY_TRACT | 0 refills | Status: AC
  Filled 2024-02-21: qty 6.7, 17d supply, fill #0

## 2024-02-21 MED ORDER — AZELASTINE HCL 0.1 % NA SOLN
1.0000 | Freq: Two times a day (BID) | NASAL | 3 refills | Status: AC
  Filled 2024-02-21: qty 90, 150d supply, fill #0

## 2024-02-21 MED ORDER — LEVOCETIRIZINE DIHYDROCHLORIDE 5 MG PO TABS
5.0000 mg | ORAL_TABLET | Freq: Every evening | ORAL | 3 refills | Status: AC
  Filled 2024-02-21: qty 90, 90d supply, fill #0

## 2024-02-21 MED ORDER — MONTELUKAST SODIUM 10 MG PO TABS
10.0000 mg | ORAL_TABLET | Freq: Every day | ORAL | 3 refills | Status: AC
  Filled 2024-02-21: qty 90, 90d supply, fill #0

## 2024-02-21 MED ORDER — AZELASTINE HCL 0.05 % OP SOLN
1.0000 [drp] | Freq: Two times a day (BID) | OPHTHALMIC | 3 refills | Status: AC
  Filled 2024-02-21: qty 12, 60d supply, fill #0

## 2024-02-22 ENCOUNTER — Other Ambulatory Visit (HOSPITAL_BASED_OUTPATIENT_CLINIC_OR_DEPARTMENT_OTHER): Payer: Self-pay

## 2024-02-22 ENCOUNTER — Other Ambulatory Visit: Payer: Self-pay

## 2024-02-27 ENCOUNTER — Other Ambulatory Visit (HOSPITAL_COMMUNITY): Payer: Self-pay | Admitting: Student in an Organized Health Care Education/Training Program

## 2024-02-27 ENCOUNTER — Other Ambulatory Visit (HOSPITAL_BASED_OUTPATIENT_CLINIC_OR_DEPARTMENT_OTHER): Payer: Self-pay

## 2024-02-27 DIAGNOSIS — F332 Major depressive disorder, recurrent severe without psychotic features: Secondary | ICD-10-CM

## 2024-02-27 DIAGNOSIS — F411 Generalized anxiety disorder: Secondary | ICD-10-CM

## 2024-02-27 DIAGNOSIS — G4701 Insomnia due to medical condition: Secondary | ICD-10-CM

## 2024-02-27 NOTE — Telephone Encounter (Signed)
 Pt has called several times this morning regarding medication administration. Pt says she has spoken to pharmacist regarding the Remeron  and Risperdal  and was told that you discontinued the Remeron  30 mg at bedtime #30 with 3 fills (written by Kristene Bach). Per your note on 02/01/24 pt to continue both of these. Looks like [facility] has reached out to you this morning as well. Pt remains anxious and confused regarding whether or not to continue these meds. Please review.    Last visit: 02/01/24 Next visit: 03/12/24

## 2024-02-27 NOTE — Telephone Encounter (Signed)
 Contacted and spoke with patient at her mobile number.  She reports that she has been unable to pick up her prescription of Risperdal  at [facility].  I confirmed with her that she should be taking the following psychotropic regimen: - Remeron  30 mg at bedtime - Risperdal  2 mg at bedtime - Wellbutrin  XL 300 mg daily  The patient is also prescribed hydroxyzine  25 mg 3 times daily as needed.  She reports she finds the 25 mg dose very sedating and was agreeable to taking half a tablet for acute anxiety.  After speaking with the patient I contacted [facility] at [phone].  They confirmed the patient has refills for all of the medications above and they confirm I do not have to send any additional refills.

## 2024-02-28 ENCOUNTER — Telehealth (HOSPITAL_COMMUNITY): Payer: Self-pay | Admitting: *Deleted

## 2024-02-28 ENCOUNTER — Other Ambulatory Visit (HOSPITAL_BASED_OUTPATIENT_CLINIC_OR_DEPARTMENT_OTHER): Payer: Self-pay

## 2024-02-28 ENCOUNTER — Other Ambulatory Visit (HOSPITAL_COMMUNITY): Payer: Self-pay

## 2024-02-28 ENCOUNTER — Other Ambulatory Visit: Payer: Self-pay

## 2024-02-28 NOTE — Telephone Encounter (Signed)
 Pt called LVM stating there are no refills on the Risperdal  2 mg tabs. Writer spoke to pharmacy who confirmed they are in the process of filling that prescription currently and they are fills remaining. They also confirmed there are fills remaining on the Remeron  30 mg. Writer spoke to pt to advise. Pt verbalizes understanding.

## 2024-02-28 NOTE — Telephone Encounter (Signed)
 Reviewed and acknowledged, thank you for verifying with the pharmacy.

## 2024-03-12 ENCOUNTER — Ambulatory Visit (HOSPITAL_COMMUNITY): Admitting: Student in an Organized Health Care Education/Training Program

## 2024-03-12 ENCOUNTER — Encounter (HOSPITAL_COMMUNITY): Payer: Self-pay | Admitting: Student in an Organized Health Care Education/Training Program

## 2024-03-12 VITALS — BP 151/90 | HR 103 | Ht 60.0 in | Wt 161.0 lb

## 2024-03-12 DIAGNOSIS — F411 Generalized anxiety disorder: Secondary | ICD-10-CM

## 2024-03-12 DIAGNOSIS — F332 Major depressive disorder, recurrent severe without psychotic features: Secondary | ICD-10-CM

## 2024-03-12 DIAGNOSIS — G4701 Insomnia due to medical condition: Secondary | ICD-10-CM

## 2024-03-12 DIAGNOSIS — G250 Essential tremor: Secondary | ICD-10-CM | POA: Diagnosis not present

## 2024-03-12 MED ORDER — HYDROXYZINE HCL 25 MG PO TABS
25.0000 mg | ORAL_TABLET | Freq: Three times a day (TID) | ORAL | 2 refills | Status: AC | PRN
  Filled 2024-03-12: qty 90, 30d supply, fill #0

## 2024-03-12 MED ORDER — PRIMIDONE 50 MG PO TABS
25.0000 mg | ORAL_TABLET | Freq: Every day | ORAL | 2 refills | Status: AC
  Filled 2024-03-12: qty 30, 60d supply, fill #0

## 2024-03-12 MED ORDER — BUPROPION HCL ER (XL) 300 MG PO TB24
300.0000 mg | ORAL_TABLET | Freq: Every day | ORAL | 3 refills | Status: AC
  Filled 2024-03-12: qty 30, 30d supply, fill #0

## 2024-03-12 MED ORDER — MIRTAZAPINE 30 MG PO TABS
30.0000 mg | ORAL_TABLET | Freq: Every day | ORAL | 3 refills | Status: AC
  Filled 2024-03-12: qty 30, 30d supply, fill #0

## 2024-03-12 MED ORDER — RISPERIDONE 1 MG PO TABS
1.0000 mg | ORAL_TABLET | Freq: Every day | ORAL | 2 refills | Status: AC
  Filled 2024-03-12: qty 30, 30d supply, fill #0
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
## 2024-03-13 ENCOUNTER — Other Ambulatory Visit (HOSPITAL_BASED_OUTPATIENT_CLINIC_OR_DEPARTMENT_OTHER): Payer: Self-pay

## 2024-03-13 ENCOUNTER — Other Ambulatory Visit: Payer: Self-pay

## 2024-03-15 ENCOUNTER — Telehealth (HOSPITAL_COMMUNITY): Payer: Self-pay | Admitting: *Deleted

## 2024-03-15 NOTE — Telephone Encounter (Signed)
 Contacted and spoke with the patient.  She reports that since the primidone  was started it has effectively improved her tremors, but feels that she is having difficulty getting up in the mornings.  She reports feeling excessively sedated during the day.  She was amenable to reducing the dose of her primidone  from 25 mg to 12.5 mg tonight to see if sedation improves.  I recommended that she reach out again if this does not be sedation.  Will continue to monitor.

## 2024-03-15 NOTE — Telephone Encounter (Signed)
 Pt called with c/o the new medication (Primidone  25 mg at bedtime) is making her feel out of it. Pt says she slept 10 hours and remains sedated throughout the day and unable to drive. Pt woould like to speak with you about alternative medication.

## 2024-03-22 ENCOUNTER — Other Ambulatory Visit (HOSPITAL_BASED_OUTPATIENT_CLINIC_OR_DEPARTMENT_OTHER): Payer: Self-pay

## 2024-03-28 ENCOUNTER — Other Ambulatory Visit (HOSPITAL_COMMUNITY): Payer: Self-pay

## 2024-03-28 ENCOUNTER — Telehealth (HOSPITAL_COMMUNITY): Payer: Self-pay | Admitting: *Deleted

## 2024-03-28 ENCOUNTER — Other Ambulatory Visit (HOSPITAL_BASED_OUTPATIENT_CLINIC_OR_DEPARTMENT_OTHER): Payer: Self-pay

## 2024-03-28 DIAGNOSIS — F411 Generalized anxiety disorder: Secondary | ICD-10-CM

## 2024-03-28 DIAGNOSIS — F332 Major depressive disorder, recurrent severe without psychotic features: Secondary | ICD-10-CM

## 2024-03-28 MED ORDER — SERTRALINE HCL 50 MG PO TABS
ORAL_TABLET | ORAL | 0 refills | Status: AC
  Filled 2024-03-28: qty 34, 38d supply, fill #0

## 2024-03-28 MED ORDER — MIRTAZAPINE 7.5 MG PO TABS
15.0000 mg | ORAL_TABLET | Freq: Every day | ORAL | 0 refills | Status: AC
  Filled 2024-03-28: qty 7, 3d supply, fill #0

## 2024-03-28 NOTE — Telephone Encounter (Signed)
 Patient has called twice to advise that she is feeling better since decreasing the dose of the Primidone . However she still feels that her depression is getting worse. Pt is requesting t/c. Please review and advise.   Last visit: 03/12/24 Next visit: 04/09/24

## 2024-03-28 NOTE — Telephone Encounter (Signed)
 Contacted and spoke with the patient at her mobile number.  She reports her tremors have improved since decreasing the dose of primidone .  She confirms recent worsening depression, reports low motivation, psychomotor slowing, hypersomnia.   She is amenable to starting an SSRI and patient was agreeable to cross titrating from Remeron  to Zoloft .  Titration schedule was discussed, instructions given over the phone, and patient was able to read back the instructions.  Cross titration schedule: -Start Zoloft  25 mg daily for 7 days, then increase to 50 mg daily until follow-up -Decrease Remeron  from 30 mg to 15 mg nightly for 7 days, then decrease to 7.5 mg nightly for 7 days and stop   Miranda Berger Miranda Carrero, MD PGY-3, [facility]

## 2024-03-29 ENCOUNTER — Other Ambulatory Visit: Payer: Self-pay

## 2024-04-09 ENCOUNTER — Ambulatory Visit (HOSPITAL_COMMUNITY): Admitting: Student in an Organized Health Care Education/Training Program
